# Patient Record
Sex: Male | Born: 1965
Health system: Southern US, Community
[De-identification: ages and names within clinical notes are randomized; demographics above are authoritative.]

## PROBLEM LIST (undated history)

## (undated) DIAGNOSIS — I2510 Atherosclerotic heart disease of native coronary artery without angina pectoris: Secondary | ICD-10-CM

## (undated) DIAGNOSIS — N189 Chronic kidney disease, unspecified: Secondary | ICD-10-CM

## (undated) DIAGNOSIS — G709 Myoneural disorder, unspecified: Secondary | ICD-10-CM

## (undated) DIAGNOSIS — I10 Essential (primary) hypertension: Secondary | ICD-10-CM

## (undated) DIAGNOSIS — M1990 Unspecified osteoarthritis, unspecified site: Secondary | ICD-10-CM

## (undated) DIAGNOSIS — E785 Hyperlipidemia, unspecified: Secondary | ICD-10-CM

## (undated) DIAGNOSIS — M792 Neuralgia and neuritis, unspecified: Secondary | ICD-10-CM

## (undated) DIAGNOSIS — Z992 Dependence on renal dialysis: Secondary | ICD-10-CM

## (undated) DIAGNOSIS — K5792 Diverticulitis of intestine, part unspecified, without perforation or abscess without bleeding: Secondary | ICD-10-CM

## (undated) HISTORY — DX: Chronic kidney disease, unspecified: N18.9

## (undated) HISTORY — DX: Dependence on renal dialysis: Z99.2

## (undated) HISTORY — PX: COLONOSCOPY: SHX174

## (undated) HISTORY — DX: Neuralgia and neuritis, unspecified: M79.2

---

## 2002-01-27 ENCOUNTER — Emergency Department (HOSPITAL_COMMUNITY): Admission: EM | Admit: 2002-01-27 | Discharge: 2002-01-27 | Payer: Self-pay | Admitting: Emergency Medicine

## 2011-08-29 DIAGNOSIS — Z992 Dependence on renal dialysis: Secondary | ICD-10-CM

## 2011-08-29 HISTORY — DX: Dependence on renal dialysis: Z99.2

## 2011-11-06 ENCOUNTER — Inpatient Hospital Stay (HOSPITAL_COMMUNITY): Payer: 59

## 2011-11-06 ENCOUNTER — Inpatient Hospital Stay (HOSPITAL_COMMUNITY)
Admission: AD | Admit: 2011-11-06 | Discharge: 2011-11-11 | DRG: 673 | Disposition: A | Discharge status: Home / Self Care | Payer: 59 | Source: Other Acute Inpatient Hospital | Attending: Internal Medicine | Admitting: Internal Medicine

## 2011-11-06 ENCOUNTER — Encounter (HOSPITAL_COMMUNITY): Payer: Self-pay | Admitting: Pulmonary Disease

## 2011-11-06 DIAGNOSIS — F17200 Nicotine dependence, unspecified, uncomplicated: Secondary | ICD-10-CM | POA: Diagnosis present

## 2011-11-06 DIAGNOSIS — D638 Anemia in other chronic diseases classified elsewhere: Secondary | ICD-10-CM | POA: Diagnosis present

## 2011-11-06 DIAGNOSIS — N2581 Secondary hyperparathyroidism of renal origin: Secondary | ICD-10-CM | POA: Diagnosis present

## 2011-11-06 DIAGNOSIS — I10 Essential (primary) hypertension: Secondary | ICD-10-CM | POA: Diagnosis present

## 2011-11-06 DIAGNOSIS — R290 Tetany: Secondary | ICD-10-CM | POA: Diagnosis present

## 2011-11-06 DIAGNOSIS — I120 Hypertensive chronic kidney disease with stage 5 chronic kidney disease or end stage renal disease: Principal | ICD-10-CM | POA: Diagnosis present

## 2011-11-06 DIAGNOSIS — R0789 Other chest pain: Secondary | ICD-10-CM | POA: Diagnosis present

## 2011-11-06 DIAGNOSIS — N186 End stage renal disease: Secondary | ICD-10-CM | POA: Diagnosis present

## 2011-11-06 DIAGNOSIS — Z79899 Other long term (current) drug therapy: Secondary | ICD-10-CM

## 2011-11-06 DIAGNOSIS — D631 Anemia in chronic kidney disease: Secondary | ICD-10-CM | POA: Diagnosis present

## 2011-11-06 DIAGNOSIS — K219 Gastro-esophageal reflux disease without esophagitis: Secondary | ICD-10-CM | POA: Diagnosis present

## 2011-11-06 DIAGNOSIS — E872 Acidosis, unspecified: Secondary | ICD-10-CM | POA: Diagnosis present

## 2011-11-06 DIAGNOSIS — N039 Chronic nephritic syndrome with unspecified morphologic changes: Secondary | ICD-10-CM | POA: Diagnosis present

## 2011-11-06 DIAGNOSIS — N179 Acute kidney failure, unspecified: Secondary | ICD-10-CM | POA: Diagnosis present

## 2011-11-06 LAB — COMPREHENSIVE METABOLIC PANEL
ALT: 12 U/L (ref 0–53)
AST: 12 U/L (ref 0–37)
Albumin: 3.4 g/dL — ABNORMAL LOW (ref 3.5–5.2)
Alkaline Phosphatase: 115 U/L (ref 39–117)
BUN: 100 mg/dL — ABNORMAL HIGH (ref 6–23)
CO2: 13 mEq/L — ABNORMAL LOW (ref 19–32)
Calcium: 6 mg/dL — CL (ref 8.4–10.5)
Chloride: 110 mEq/L (ref 96–112)
Creatinine, Ser: 10.33 mg/dL — ABNORMAL HIGH (ref 0.50–1.35)
GFR calc Af Amer: 6 mL/min — ABNORMAL LOW (ref >90)
GFR calc non Af Amer: 5 mL/min — ABNORMAL LOW (ref >90)
Glucose, Bld: 98 mg/dL (ref 70–99)
Potassium: 4.5 mEq/L (ref 3.5–5.1)
Sodium: 142 mEq/L (ref 135–145)
Total Bilirubin: 0.2 mg/dL — ABNORMAL LOW (ref 0.3–1.2)
Total Protein: 6.2 g/dL (ref 6.0–8.3)

## 2011-11-06 LAB — DIFFERENTIAL
Basophils Absolute: 0 10*3/uL (ref 0.0–0.1)
Basophils Relative: 0 % (ref 0–1)
Eosinophils Absolute: 0.1 10*3/uL (ref 0.0–0.7)
Eosinophils Relative: 1 % (ref 0–5)
Lymphocytes Relative: 12 % (ref 12–46)
Lymphs Abs: 1.1 10*3/uL (ref 0.7–4.0)
Monocytes Absolute: 0.6 10*3/uL (ref 0.1–1.0)
Monocytes Relative: 6 % (ref 3–12)
Neutro Abs: 7.3 10*3/uL (ref 1.7–7.7)
Neutrophils Relative %: 81 % — ABNORMAL HIGH (ref 43–77)

## 2011-11-06 LAB — CBC
HCT: 26 % — ABNORMAL LOW (ref 39.0–52.0)
Hemoglobin: 8.7 g/dL — ABNORMAL LOW (ref 13.0–17.0)
MCH: 29.6 pg (ref 26.0–34.0)
MCHC: 33.5 g/dL (ref 30.0–36.0)
MCV: 88.4 fL (ref 78.0–100.0)
Platelets: 157 10*3/uL (ref 150–400)
RBC: 2.94 MIL/uL — ABNORMAL LOW (ref 4.22–5.81)
RDW: 14.6 % (ref 11.5–15.5)
WBC: 9 10*3/uL (ref 4.0–10.5)

## 2011-11-06 LAB — PRO B NATRIURETIC PEPTIDE: Pro B Natriuretic peptide (BNP): 5362 pg/mL — ABNORMAL HIGH (ref 0–125)

## 2011-11-06 LAB — CARDIAC PANEL(CRET KIN+CKTOT+MB+TROPI)
CK, MB: 4.6 ng/mL — ABNORMAL HIGH (ref 0.3–4.0)
Relative Index: 0.5 (ref 0.0–2.5)
Total CK: 844 U/L — ABNORMAL HIGH (ref 7–232)
Troponin I: <0.3 ng/mL (ref <0.30)

## 2011-11-06 LAB — PROTIME-INR
INR: 1.16 (ref 0.00–1.49)
Prothrombin Time: 15 seconds (ref 11.6–15.2)

## 2011-11-06 LAB — APTT: aPTT: 32 seconds (ref 24–37)

## 2011-11-06 LAB — LIPID PANEL
Cholesterol: 136 mg/dL (ref 0–200)
HDL: 38 mg/dL — ABNORMAL LOW (ref >39)
LDL Cholesterol: 84 mg/dL (ref 0–99)
Total CHOL/HDL Ratio: 3.6 RATIO
Triglycerides: 71 mg/dL (ref <150)
VLDL: 14 mg/dL (ref 0–40)

## 2011-11-06 LAB — CREATININE, URINE, RANDOM: Creatinine, Urine: 55.14 mg/dL

## 2011-11-06 LAB — PHOSPHORUS: Phosphorus: 9.3 mg/dL — ABNORMAL HIGH (ref 2.3–4.6)

## 2011-11-06 LAB — MAGNESIUM: Magnesium: 2.1 mg/dL (ref 1.5–2.5)

## 2011-11-06 LAB — SODIUM, URINE, RANDOM: Sodium, Ur: 65 mEq/L

## 2011-11-06 MED ORDER — ENOXAPARIN SODIUM 30 MG/0.3ML ~~LOC~~ SOLN
30.0000 mg | SUBCUTANEOUS | Status: DC
  Administered 2011-11-06 – 2011-11-08 (×3): 30 mg via SUBCUTANEOUS
  Filled 2011-11-06 (×4): qty 0.3

## 2011-11-06 MED ORDER — HYDRALAZINE HCL 20 MG/ML IJ SOLN
5.0000 mg | INTRAMUSCULAR | Status: DC | PRN
  Administered 2011-11-07: 5 mg via INTRAVENOUS
  Filled 2011-11-06 (×2): qty 1

## 2011-11-06 MED ORDER — SODIUM CHLORIDE 0.9 % IJ SOLN
3.0000 mL | Freq: Two times a day (BID) | INTRAMUSCULAR | Status: DC
  Administered 2011-11-07 – 2011-11-11 (×7): 3 mL via INTRAVENOUS

## 2011-11-06 MED ORDER — SODIUM CHLORIDE 0.9 % IV SOLN
INTRAVENOUS | Status: DC
  Administered 2011-11-06: 21:00:00 via INTRAVENOUS

## 2011-11-06 MED ORDER — ADULT MULTIVITAMIN W/MINERALS CH
1.0000 | ORAL_TABLET | Freq: Every day | ORAL | Status: DC
  Administered 2011-11-06 – 2011-11-11 (×6): 1 via ORAL
  Filled 2011-11-06 (×6): qty 1

## 2011-11-06 NOTE — H&P (Signed)
PCP:  No primary provider on file.   DOA:  11/06/2011  6:01 PM  Chief Complaint:  Chest pain  HPI: Marco Arnold is 46 yo male with no known past medical history who presents to [facility] ED with main concern of progressively worsening, right sides, pressure- like chest pain that started 1 week prior to admission initially and has become more recurrent. He had 3 episodes since last week and each episode lasting several hours, associated with shortness of breath mostly with inspiration. He denies any other specific aggravating or alleviating factors. He also denies fever, chills, abdominal or urinary concerns. He is smoker and smokes 1 ppd over 20 years. He also endorses family history of heart and kidney disease. Marco Arnold describes any specific focal weakness, no numbness or tingling, no visual changes, no headaches, no orthopnea or palpitations. He also denies similar episodes in the past. He went to urgent care and was told his BP was > 200/100 and his kidney tests were abnormal but he is not sure of additional details.   Allergies: No Known Allergies  Prior to Admission medications   Medication Sig Start Date End Date Taking? Authorizing Provider  Multiple Vitamins-Minerals (MULTIVITAMINS THER. W/MINERALS) TABS Take 1 tablet by mouth daily.   Yes Historical Provider, MD    No past medical history on file.  No past surgical history on file.  Social History:  does not have a smoking history on file. He does not have any smokeless tobacco history on file. His alcohol and drug histories not on file.  No family history on file.  Review of Systems:  Constitutional: Denies fever, chills, diaphoresis, appetite change and fatigue.  HEENT: Denies photophobia, eye pain, redness, hearing loss, ear pain, congestion, sore throat, rhinorrhea, sneezing, mouth sores, trouble swallowing, neck pain, neck stiffness and tinnitus.   Respiratory: Denies cough, and wheezing.   Cardiovascular: Denies palpitations and leg  swelling.  Gastrointestinal: Denies nausea, vomiting, abdominal pain, diarrhea, constipation, blood in stool and abdominal distention.  Genitourinary: Denies dysuria, urgency, frequency, hematuria, flank pain and difficulty urinating.  Musculoskeletal: Denies myalgias, back pain, joint swelling, arthralgias and gait problem.  Skin: Denies pallor, rash and wound.  Neurological: Denies dizziness, seizures, syncope, weakness, light-headedness, numbness and headaches.  Hematological: Denies adenopathy. Easy bruising, personal or family bleeding history  Psychiatric/Behavioral: Denies suicidal ideation, mood changes, confusion, nervousness, sleep disturbance and agitation  Physical Exam:  There were no vitals filed for this visit. In the room Hr 75 bpm, BP = 156/88, O2 sat 100%, R 15 bpm Constitutional: Vital signs reviewed.  Patient is a well-developed and well-nourished in no acute distress and cooperative with exam. Alert and oriented x3.  Head: Normocephalic and atraumatic Ear: TM normal bilaterally Mouth: no erythema or exudates, MMM Eyes: PERRL, EOMI, conjunctivae normal, No scleral icterus.  Neck: Supple, Trachea midline normal ROM, No JVD, mass, thyromegaly, or carotid bruit present.  Cardiovascular: RRR, S1 normal, S2 normal, no MRG, pulses symmetric and intact bilaterally Pulmonary/Chest: CTAB, no wheezes, rales, or rhonchi Abdominal: Soft. Non-tender, non-distended, bowel sounds are normal, no masses, organomegaly, or guarding present.  GU: no CVA tenderness Musculoskeletal: No joint deformities, erythema, or stiffness, ROM full and no nontender Ext: no edema and no cyanosis, pulses palpable bilaterally (DP and Marco Arnold) Hematology: no cervical, inginal, or axillary adenopathy.  Neurological: A&O x3, Strenght is normal and symmetric bilaterally, cranial nerve II-XII are grossly intact, no focal motor deficit, sensory intact to light touch bilaterally.  Skin: Warm, dry and intact. No rash,  cyanosis, or clubbing.  Psychiatric: Normal mood and affect. speech and behavior is normal. Judgment and thought content normal. Cognition and memory are normal.   Labs on Admission:  No results found for this or any previous visit (from the past 48 hour(s)).  Radiological Exams on Admission: No results found.  Assessment/Plan   *Acute kidney injury - perhaps secondary to uncontrolled HTN - unclear etiology however, as per ED physician creatinine on admission ~ 14 but there are no admission labs - we will order stat CMP, CBC, UA, renal ultrasound - renal consult - IV fluids NS - follow up admission labs  Chest pain - certainly worrisome for ACS given multiple risk factors - will admit to SDU and obtain cardiac work up - currently no blood work available for review - follow up CXR - follow up cardiac enzymes - follow up EKG and 2 D ECHO - follow up TSH , BNP and A1c, lipid panel - will decide on cardio consult in AM - provide supportive care with morphine, antiemetics  HTN - uncontrolled - will start IV BP medication - current BP 156/88 - will keep monitoring vitals per floor protocol - based on BMP will likely need to hold off on ACEI, ARBS  Admit to stepdown  Time Spent on Admission: Greater than 45 minutes  [place], Gerrica Cygan 11/06/2011, 7:23 PM  [phone]

## 2011-11-06 NOTE — Progress Notes (Signed)
11/06/11 2210   Called MD about pt critical low Ca (6.0). Value is relatively consistent with previous values at [facility] prior to admission. Also spoke with MD about pt SBP > 180. MD is ordering PRN hydralazine.   Wyn Quaker RN

## 2011-11-07 ENCOUNTER — Inpatient Hospital Stay (HOSPITAL_COMMUNITY): Payer: 59

## 2011-11-07 LAB — COMPREHENSIVE METABOLIC PANEL
ALT: 11 U/L (ref 0–53)
AST: 12 U/L (ref 0–37)
Albumin: 3.2 g/dL — ABNORMAL LOW (ref 3.5–5.2)
Alkaline Phosphatase: 109 U/L (ref 39–117)
BUN: 102 mg/dL — ABNORMAL HIGH (ref 6–23)
CO2: 15 mEq/L — ABNORMAL LOW (ref 19–32)
Calcium: 5.8 mg/dL — CL (ref 8.4–10.5)
Chloride: 110 mEq/L (ref 96–112)
Creatinine, Ser: 9.89 mg/dL — ABNORMAL HIGH (ref 0.50–1.35)
GFR calc Af Amer: 6 mL/min — ABNORMAL LOW (ref >90)
GFR calc non Af Amer: 6 mL/min — ABNORMAL LOW (ref >90)
Glucose, Bld: 95 mg/dL (ref 70–99)
Potassium: 3.9 mEq/L (ref 3.5–5.1)
Sodium: 142 mEq/L (ref 135–145)
Total Bilirubin: 0.2 mg/dL — ABNORMAL LOW (ref 0.3–1.2)
Total Protein: 6 g/dL (ref 6.0–8.3)

## 2011-11-07 LAB — URINE MICROSCOPIC-ADD ON

## 2011-11-07 LAB — IRON AND TIBC
Iron: 51 ug/dL (ref 42–135)
Saturation Ratios: 21 % (ref 20–55)
TIBC: 241 ug/dL (ref 215–435)
UIBC: 190 ug/dL (ref 125–400)

## 2011-11-07 LAB — OSMOLALITY, URINE: Osmolality, Ur: 307 mOsm/kg — ABNORMAL LOW (ref 390–1090)

## 2011-11-07 LAB — URINALYSIS, ROUTINE W REFLEX MICROSCOPIC
Bilirubin Urine: NEGATIVE
Glucose, UA: 100 mg/dL — AB
Ketones, ur: NEGATIVE mg/dL
Leukocytes, UA: NEGATIVE
Nitrite: NEGATIVE
Protein, ur: 100 mg/dL — AB
Specific Gravity, Urine: 1.011 (ref 1.005–1.030)
Urobilinogen, UA: 0.2 mg/dL (ref 0.0–1.0)
pH: 5.5 (ref 5.0–8.0)

## 2011-11-07 LAB — CBC
HCT: 25.7 % — ABNORMAL LOW (ref 39.0–52.0)
Hemoglobin: 8.6 g/dL — ABNORMAL LOW (ref 13.0–17.0)
MCH: 29.6 pg (ref 26.0–34.0)
MCHC: 33.5 g/dL (ref 30.0–36.0)
MCV: 88.3 fL (ref 78.0–100.0)
Platelets: 157 10*3/uL (ref 150–400)
RBC: 2.91 MIL/uL — ABNORMAL LOW (ref 4.22–5.81)
RDW: 14.5 % (ref 11.5–15.5)
WBC: 9.8 10*3/uL (ref 4.0–10.5)

## 2011-11-07 LAB — CARDIAC PANEL(CRET KIN+CKTOT+MB+TROPI)
CK, MB: 4.4 ng/mL — ABNORMAL HIGH (ref 0.3–4.0)
CK, MB: 4.5 ng/mL — ABNORMAL HIGH (ref 0.3–4.0)
Relative Index: 0.6 (ref 0.0–2.5)
Relative Index: 0.6 (ref 0.0–2.5)
Total CK: 766 U/L — ABNORMAL HIGH (ref 7–232)
Total CK: 758 U/L — ABNORMAL HIGH (ref 7–232)
Troponin I: <0.3 ng/mL (ref <0.30)
Troponin I: <0.3 ng/mL (ref <0.30)

## 2011-11-07 LAB — FERRITIN: Ferritin: 267 ng/mL (ref 22–322)

## 2011-11-07 LAB — GLUCOSE, CAPILLARY: Glucose-Capillary: 146 mg/dL — ABNORMAL HIGH (ref 70–99)

## 2011-11-07 LAB — HEMOGLOBIN A1C
Hgb A1c MFr Bld: 5.2 % (ref <5.7)
Mean Plasma Glucose: 103 mg/dL (ref <117)

## 2011-11-07 LAB — TSH: TSH: 0.019 u[IU]/mL — ABNORMAL LOW (ref 0.350–4.500)

## 2011-11-07 MED ORDER — CALCIUM CARBONATE 1250 (500 CA) MG PO TABS
1000.0000 mg | ORAL_TABLET | Freq: Three times a day (TID) | ORAL | Status: DC
  Administered 2011-11-07 – 2011-11-09 (×7): 1000 mg via ORAL
  Filled 2011-11-07 (×11): qty 2

## 2011-11-07 MED ORDER — HYDRALAZINE HCL 20 MG/ML IJ SOLN
10.0000 mg | INTRAMUSCULAR | Status: DC | PRN
  Administered 2011-11-07 – 2011-11-09 (×3): 10 mg via INTRAVENOUS
  Filled 2011-11-07 (×4): qty 1

## 2011-11-07 MED ORDER — CLONIDINE HCL 0.1 MG PO TABS
0.1000 mg | ORAL_TABLET | ORAL | Status: DC | PRN
  Administered 2011-11-07 – 2011-11-08 (×3): 0.1 mg via ORAL
  Filled 2011-11-07 (×5): qty 1

## 2011-11-07 MED ORDER — AMLODIPINE BESYLATE 5 MG PO TABS
5.0000 mg | ORAL_TABLET | Freq: Every day | ORAL | Status: DC
  Administered 2011-11-07: 5 mg via ORAL
  Filled 2011-11-07 (×2): qty 1

## 2011-11-07 MED ORDER — SODIUM CHLORIDE 0.9 % IV SOLN
1.0000 g | Freq: Once | INTRAVENOUS | Status: AC
  Administered 2011-11-07: 1 g via INTRAVENOUS
  Filled 2011-11-07: qty 10

## 2011-11-07 MED ORDER — NITROGLYCERIN IN D5W 200-5 MCG/ML-% IV SOLN
2.0000 ug/min | INTRAVENOUS | Status: DC
  Filled 2011-11-07: qty 250

## 2011-11-07 MED ORDER — ONDANSETRON HCL 4 MG/2ML IJ SOLN
INTRAMUSCULAR | Status: AC
  Administered 2011-11-07: 4 mg
  Filled 2011-11-07: qty 2

## 2011-11-07 MED ORDER — ONDANSETRON HCL 4 MG/2ML IJ SOLN
4.0000 mg | Freq: Four times a day (QID) | INTRAMUSCULAR | Status: DC | PRN
  Administered 2011-11-07 – 2011-11-09 (×3): 4 mg via INTRAVENOUS
  Filled 2011-11-07 (×3): qty 2

## 2011-11-07 MED ORDER — ACETAMINOPHEN 325 MG PO TABS
650.0000 mg | ORAL_TABLET | Freq: Four times a day (QID) | ORAL | Status: DC | PRN
  Administered 2011-11-07 – 2011-11-10 (×2): 650 mg via ORAL
  Filled 2011-11-07 (×2): qty 2

## 2011-11-07 MED ORDER — HYDRALAZINE HCL 20 MG/ML IJ SOLN
5.0000 mg | INTRAMUSCULAR | Status: AC
  Administered 2011-11-07: 5 mg via INTRAVENOUS
  Filled 2011-11-07: qty 1

## 2011-11-07 MED ORDER — SODIUM CHLORIDE 0.9 % IV SOLN
1.0000 g | Freq: Once | INTRAVENOUS | Status: DC
  Filled 2011-11-07 (×2): qty 10

## 2011-11-07 NOTE — Progress Notes (Signed)
Utilization Review Completed.Donne Anon T3/07/2012

## 2011-11-07 NOTE — Consult Note (Signed)
Marco Arnold is an 46 y.o. male referred by Dr Charlies Silvers   Chief Complaint: CKD HPI: 46 yo WM with no past medical history presents to [facility] hospital bc sharp, fleeting Rt sided chest pain.  BP was quite elevated and labs showed Scr 11.  Has had 1-2 episodes of gross hematuria in his life with last one being about one month ago.  Has taken about 15-20 aleeve over past month.  Mild foaminess to urine in past month.  No change in UO.  Appetite good but energy decreased in past month along with decreased libido.  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  Muscle cramps in hands and legs x 2-3 months.  Long history of blood tinged stools that he relates to hemrhoids.  Grandfather had ESRD of ? Etiology in his 61's.  No family history of hearing loss  No past medical history on file.  No past surgical history on file.  No family history on file. Social History:  reports that he has been smoking Cigarettes.  He has smoked for the past 25 years. He does not have any smokeless tobacco history on file. He reports that he does not use illicit drugs. His alcohol history not on file.  Allergies: No Known Allergies  Medications Prior to Admission  Medication Dose Route Frequency Provider Last Rate Last Dose  . acetaminophen (TYLENOL) tablet 650 mg  650 mg Oral Q6H PRN Ambrose Finland, NP   650 mg at 11/07/11 0708  . amLODipine (NORVASC) tablet 5 mg  5 mg Oral Daily Windy Kalata, MD      . cloNIDine (CATAPRES) tablet 0.1 mg  0.1 mg Oral Q4H PRN Windy Kalata, MD      . enoxaparin (LOVENOX) injection 30 mg  30 mg Subcutaneous Q24H Robbie Lis, MD   30 mg at 11/06/11 2105  . hydrALAZINE (APRESOLINE) injection 5 mg  5 mg Intravenous Q4H PRN Ambrose Finland, NP      . mulitivitamin with minerals tablet 1 tablet  1 tablet Oral Daily Robbie Lis, MD   1 tablet at 11/07/11 1009  . ondansetron (ZOFRAN) 4 MG/2ML injection        4 mg at 11/07/11 0200  . ondansetron (ZOFRAN) injection 4 mg  4 mg  Intravenous Q6H PRN Ambrose Finland, NP      . sodium chloride 0.9 % injection 3 mL  3 mL Intravenous Q12H Robbie Lis, MD   3 mL at 11/07/11 1011  . DISCONTD: 0.9 %  sodium chloride infusion   Intravenous Continuous Robbie Lis, MD 75 mL/hr at 11/06/11 2105    . DISCONTD: nitroGLYCERIN 0.2 mg/mL in dextrose 5 % infusion  2-200 mcg/min Intravenous Titrated Robbie Lis, MD       No current outpatient prescriptions on file as of 11/07/2011.     Lab Results: UA: not done  [facility] 11/07/11 0345 11/06/11 1927  WBC 9.8 9.0  HGB 8.6* 8.7*  HCT 25.7* 26.0*  PLT 157 157   BMET  Basename 11/07/11 0345 11/06/11 1927  NA 142 142  K 3.9 4.5  CL 110 110  CO2 15* 13*  GLUCOSE 95 98  BUN 102* 100*  CREATININE 9.89* 10.33*  CALCIUM 5.8* 6.0*  PHOS -- 9.3*   LFT  Basename 11/07/11 0345  PROT 6.0  ALBUMIN 3.2*  AST 12  ALT 11  ALKPHOS 109  BILITOT 0.2*  BILIDIR --  IBILI --  US Renal  11/07/2011  *RADIOLOGY REPORT*  Clinical Data: Acute renal failure.  RENAL/URINARY TRACT ULTRASOUND COMPLETE  Comparison:  None.  Findings:  Right Kidney:  10.6 cm.  Renal cortical atrophy and increased echogenicity.  No hydronephrosis.  Multiple renal cysts.  The largest is in the upper pole, measures 2.4 cm.  Left Kidney:  10.5 cm.  Renal cortical thinning and increased echogenicity.  Numerous renal cysts.  The largest is in the lower pole and measures 3.1 cm.  Bladder:  Within normal limits.  IMPRESSION:  1.  Findings of medical renal disease. 2.  Bilateral renal cysts. 3. No hydronephrosis.  Original Report Authenticated By: Areta Haber, M.D.   Dg Chest Port 1 View  11/06/2011  *RADIOLOGY REPORT*  Clinical Data: Left chest pain, pressure  PORTABLE CHEST - 1 VIEW  Comparison: None.  Findings: Borderline heart size and vascular congestion.  Negative for CHF or pneumonia.  No effusion or pneumothorax.  Trachea midline.  IMPRESSION: Borderline cardiomegaly but no CHF or pneumonia  Original Report  Authenticated By: Jerilynn Mages. Daryll Brod, M.D.    ROS: As above Some mild decrease in vision Numbness in Rt hand, now resolved Skin rash on both pretibial regions for past month, now resolving with eucerin  PHYSICAL EXAM: Blood pressure 183/80, pulse 77, temperature 97.9 F (36.6 C), temperature source Oral, resp. rate 16, height 6\' 3"  (1.905 m), weight 103.5 kg (228 lb 2.8 oz), SpO2 100.00%. HEENT: PERRLA, EOMI Fundi-moderate arteriole narrowing NECK:no jvd LUNGS:clear CARDIAC:RRR wo MRG ABD:+bs NTND no bruits EXT:no edema  Faint,resolving rash both pretibial regions NEURO:CNI  M&SI no asterixis  Assessment: 1. Renal failure,  I suspect this is chronic and not reversible with HTN high on the list of causes.   2. Hypocalcemia  Points to chronicity of renal failure 3. Anemia - again points to chronicity 4. HTN PLAN: 1. Check UA 2. Check iron studies, pth 3. DC iv fluids and iv nitro 4. Start amlodipine and use clonidine prn for bp 5. Show dialysis video tapes 6. Check serologic studies and SPEP 7. Discussed reality of needing HD 8. Replace calcium   Katera Rybka T 11/07/2011, 2:33 PM

## 2011-11-07 NOTE — Progress Notes (Signed)
Patient ID: Marco Arnold, male   DOB: 1965-10-16, 46 y.o.   MRN: [id number]  Assessment/Plan   *Acute kidney injury  - perhaps secondary to uncontrolled HTN / family history of ESRD - renal consult appreciated - replace calcium  Chest pain  - the 3 sets of cardiac enzymes are negative so far - CXR with no consolidation or pneumonia  - follow up 2 D ECHO  - follow up A1c (lipid panel is normal, TSH  0.0.19)  - provide supportive care with morphine, antiemetics   HTN  - uncontrolled  - patient was on nitro drip - follow up renal recomendation - will keep monitoring vitals per floor protocol   LOW TSH - we will obtain T3, T4 and cortisol level - at present no symptoms of tachycardia, diarrhea; no signs of proptosis - called endocrinology for an input, recommendation is to follow up on the above labs; may initiate methimazole if symptomatic   Subjective:  No events overnight. Patient denies chest pain, shortness of breath, abdominal pain.   Objective:  Vital signs in last 24 hours:   11/07/11 1200 11/07/11 1600  BP: 176/83 200/97  Pulse:    Temp: 97.9 F (36.6 C) 98 F (36.7 C)  TempSrc: Oral Oral  Resp:    Height:    Weight:    SpO2:      Intake/Output from previous day:   Gross per 24 hour  Intake    975 ml  Output   2150 ml  Net  -1175 ml    Physical Exam: General: Alert, awake, oriented x3, in no acute distress. HEENT: No bruits, no goiter. Moist mucous membranes, no scleral icterus, no conjunctival pallor. Heart: Regular rate and rhythm, S1/S2 +, no murmurs, rubs, gallops. Lungs: Clear to auscultation bilaterally. No wheezing, no rhonchi, no rales.  Abdomen: Soft, nontender, nondistended, positive bowel sounds. Extremities: No clubbing or cyanosis, no pitting edema,  positive pedal pulses. Neuro: Grossly nonfocal.  Lab Results:   Lab 11/07/11 0345 11/06/11 1927  WBC 9.8 9.0  HGB 8.6* 8.7*  HCT 25.7* 26.0*  PLT 157 157  MCV 88.3 88.4    Lab  11/07/11 0345 11/06/11 1927  NA 142 142  K 3.9 4.5  CL 110 110  CO2 15* 13*  GLUCOSE 95 98  BUN 102* 100*  CREATININE 9.89* 10.33*  CALCIUM 5.8* 6.0*    Lab 11/06/11 1927  INR 1.16   Cardiac markers:  Lab 11/07/11 1138 11/07/11 0350 11/06/11 1927  CKMB 4.5* 4.4* 4.6*  TROPONINI <0.30 <0.30 <0.30    Studies/Results: US Renal 11/07/2011   IMPRESSION:  1.  Findings of medical renal disease. 2.  Bilateral renal cysts. 3. No hydronephrosis.   Dg Chest Port 1 View 11/06/2011  * IMPRESSION: Borderline cardiomegaly but no CHF or pneumonia    Medications: Scheduled Meds:   . amLODipine  5 mg Oral Daily  . calcium carbonate  1,000 mg of  calcium Oral TID WC  . calcium gluconate  1 g Intravenous Once  . calcium gluconate  1 g Intravenous Once  . enoxaparin  30 mg Subcutaneous Q24H  . mulitivitamin with minerals  1 tablet Oral Daily     EDUCATION - test results and diagnostic studies were discussed with patient and pt's family who was present at the bedside - patient and family have verbalized the understanding - questions were answered at the bedside and contact information was provided for additional questions or concerns   LOS: 1 day  Zeven Kocak 11/07/2011, 4:35 PM  [facility] Pager: [phone]

## 2011-11-08 DIAGNOSIS — I517 Cardiomegaly: Secondary | ICD-10-CM

## 2011-11-08 DIAGNOSIS — N186 End stage renal disease: Secondary | ICD-10-CM

## 2011-11-08 LAB — MPO/PR-3 (ANCA) ANTIBODIES
Myeloperoxidase Abs: <1 [AU]/ml
Serine Protease 3: 1 [AU]/ml

## 2011-11-08 LAB — RENAL FUNCTION PANEL
Albumin: 3.3 g/dL — ABNORMAL LOW (ref 3.5–5.2)
BUN: 97 mg/dL — ABNORMAL HIGH (ref 6–23)
CO2: 15 meq/L — ABNORMAL LOW (ref 19–32)
Calcium: 7.2 mg/dL — ABNORMAL LOW (ref 8.4–10.5)
Chloride: 109 meq/L (ref 96–112)
Creatinine, Ser: 9.73 mg/dL — ABNORMAL HIGH (ref 0.50–1.35)
GFR calc Af Amer: 7 mL/min — ABNORMAL LOW
GFR calc non Af Amer: 6 mL/min — ABNORMAL LOW
Glucose, Bld: 113 mg/dL — ABNORMAL HIGH (ref 70–99)
Phosphorus: 7.2 mg/dL — ABNORMAL HIGH (ref 2.3–4.6)
Potassium: 3.9 meq/L (ref 3.5–5.1)
Sodium: 141 meq/L (ref 135–145)

## 2011-11-08 LAB — HEPATITIS B SURFACE ANTIGEN: Hepatitis B Surface Ag: NEGATIVE

## 2011-11-08 LAB — KAPPA/LAMBDA LIGHT CHAINS
Kappa free light chain: 11.1 mg/dL — ABNORMAL HIGH (ref 0.33–1.94)
Kappa, lambda light chain ratio: 2.82 — ABNORMAL HIGH (ref 0.26–1.65)
Lambda free light chains: 3.94 mg/dL — ABNORMAL HIGH (ref 0.57–2.63)

## 2011-11-08 LAB — GLOMERULAR BASEMENT MEMBRANE ANTIBODIES: GBM Ab: <1 AU/mL (ref <20)

## 2011-11-08 LAB — T3, FREE: T3, Free: 1.9 pg/mL — ABNORMAL LOW (ref 2.3–4.2)

## 2011-11-08 LAB — T4, FREE: Free T4: 0.93 ng/dL (ref 0.80–1.80)

## 2011-11-08 LAB — ANA: Anti Nuclear Antibody(ANA): NEGATIVE

## 2011-11-08 LAB — C3 COMPLEMENT: C3 Complement: 96 mg/dL (ref 90–180)

## 2011-11-08 LAB — CREATININE, URINE, 24 HOUR

## 2011-11-08 LAB — C4 COMPLEMENT: Complement C4, Body Fluid: 30 mg/dL (ref 10–40)

## 2011-11-08 LAB — PARATHYROID HORMONE, INTACT (NO CA): PTH: 383.5 pg/mL — ABNORMAL HIGH (ref 14.0–72.0)

## 2011-11-08 MED ORDER — CLONIDINE HCL 0.1 MG PO TABS
0.1000 mg | ORAL_TABLET | Freq: Two times a day (BID) | ORAL | Status: DC
  Administered 2011-11-08: 0.1 mg via ORAL
  Filled 2011-11-08 (×2): qty 1

## 2011-11-08 MED ORDER — CLONIDINE HCL 0.1 MG PO TABS
0.1000 mg | ORAL_TABLET | ORAL | Status: DC | PRN
  Administered 2011-11-08 – 2011-11-09 (×2): 0.1 mg via ORAL
  Filled 2011-11-08: qty 1

## 2011-11-08 MED ORDER — AMLODIPINE BESYLATE 10 MG PO TABS
10.0000 mg | ORAL_TABLET | Freq: Every day | ORAL | Status: DC
  Administered 2011-11-08 – 2011-11-11 (×4): 10 mg via ORAL
  Filled 2011-11-08 (×4): qty 1

## 2011-11-08 MED ORDER — CARVEDILOL 12.5 MG PO TABS
12.5000 mg | ORAL_TABLET | Freq: Two times a day (BID) | ORAL | Status: DC
  Administered 2011-11-08 – 2011-11-11 (×6): 12.5 mg via ORAL
  Filled 2011-11-08 (×8): qty 1

## 2011-11-08 MED ORDER — DARBEPOETIN ALFA-POLYSORBATE 100 MCG/0.5ML IJ SOLN
100.0000 ug | INTRAMUSCULAR | Status: DC
  Administered 2011-11-08: 100 ug via SUBCUTANEOUS
  Filled 2011-11-08: qty 0.5

## 2011-11-08 MED ORDER — FUROSEMIDE 40 MG PO TABS
40.0000 mg | ORAL_TABLET | Freq: Two times a day (BID) | ORAL | Status: DC
  Administered 2011-11-08 – 2011-11-11 (×5): 40 mg via ORAL
  Filled 2011-11-08 (×8): qty 1

## 2011-11-08 NOTE — Progress Notes (Signed)
Patient ID: Marco Arnold, male   DOB: 1965-12-24, 46 y.o.   MRN: [id number]  Assessment/Plan   *Acute kidney injury / Progressive CKD Renal USG: suggestive of medico-renal disease - perhaps secondary to uncontrolled HTN, complement levels ok, SPEP pending, check hepatitis panel - renal consult appreciated, dialysis eval per renal - replace calcium Change to renal diet  Atypical CP resolved  Was associated with twitching of muscles in hands and around mouth Suspect secondary to hypocalcemia and uncontrolled HTN  HTN  - uncontrolled, BP between 170 and 200 yesterday Increase norvasc to 10mg , add scheduled clonidine Hydralazine PRN   LOW TSH  T3, T4 and cortisol level pending - at present no symptoms of tachycardia, diarrhea; no signs of proptosis  Anemia: of chronic disease: anemia panel unremarkable, will need erythropoietin, defer to renal  Transfer to tele, if BP better today  Subjective:  Patient denies chest pain, shortness of breath, abdominal pain.   Objective:  Vital signs in last 24 hours:   11/07/11 1200 11/07/11 1600  BP: 176/83 200/97  Pulse:    Temp: 97.9 F (36.6 C) 98 F (36.7 C)  TempSrc: Oral Oral  Resp:    Height:    Weight:    SpO2:      Intake/Output from previous day:   Gross per 24 hour  Intake    975 ml  Output   2150 ml  Net  -1175 ml    Physical Exam: General: Alert, awake, oriented x3, in no acute distress. HEENT: No bruits, no goiter. Moist mucous membranes, no scleral icterus, no conjunctival pallor. Heart: Regular rate and rhythm, S1/S2 +, no murmurs, rubs, gallops. Lungs: Clear to auscultation bilaterally. No wheezing, no rhonchi, no rales.  Abdomen: Soft, nontender, nondistended, positive bowel sounds. Extremities: No clubbing or cyanosis, no pitting edema,  positive pedal pulses. Neuro: Grossly nonfocal.  Lab Results:   Lab 11/07/11 0345 11/06/11 1927  WBC 9.8 9.0  HGB 8.6* 8.7*  HCT 25.7* 26.0*  PLT 157 157  MCV  88.3 88.4    Lab 11/07/11 0345 11/06/11 1927  NA 142 142  K 3.9 4.5  CL 110 110  CO2 15* 13*  GLUCOSE 95 98  BUN 102* 100*  CREATININE 9.89* 10.33*  CALCIUM 5.8* 6.0*    Lab 11/06/11 1927  INR 1.16   Cardiac markers:  Lab 11/07/11 1138 11/07/11 0350 11/06/11 1927  CKMB 4.5* 4.4* 4.6*  TROPONINI <0.30 <0.30 <0.30    Studies/Results: US Renal 11/07/2011   IMPRESSION:  1.  Findings of medical renal disease. 2.  Bilateral renal cysts. 3. No hydronephrosis.   Dg Chest Port 1 View 11/06/2011  * IMPRESSION: Borderline cardiomegaly but no CHF or pneumonia    Medications: Scheduled Meds:   . amLODipine  5 mg Oral Daily  . calcium carbonate  1,000 mg of  calcium Oral TID WC  . calcium gluconate  1 g Intravenous Once  . calcium gluconate  1 g Intravenous Once  . enoxaparin  30 mg Subcutaneous Q24H  . mulitivitamin with minerals  1 tablet Oral Daily      LOS: 2 days   Dvid Pendry 11/08/2011, 7:51 AM  [facility] Pager: [phone]

## 2011-11-08 NOTE — Progress Notes (Signed)
S:no N/V  No SOB O:BP 145/85  Pulse 85  Temp(Src) 98.1 F (36.7 C) (Oral)  Resp 13  Ht 6\' 3"  (1.905 m)  Wt 101.9 kg (224 lb 10.4 oz)  BMI 28.08 kg/m2  SpO2 97%  Intake/Output Summary (Last 24 hours) at 11/08/11 1005 Last data filed at 11/08/11 0800  Gross per 24 hour  Intake      3 ml  Output   2625 ml  Net  -2622 ml   Weight change: -1.7 kg (-3 lb 12 oz) [id number] and alert CVS:RRR Resp:clear Abd:+bs NTND Ext:no edema NEURO:CNI no asterixis      . amLODipine  10 mg Oral Daily  . calcium carbonate  1,000 mg of elemental calcium Oral TID WC  . calcium gluconate  1 g Intravenous Once  . calcium gluconate  1 g Intravenous Once  . cloNIDine  0.1 mg Oral BID  . enoxaparin  30 mg Subcutaneous Q24H  . hydrALAZINE  5 mg Intravenous NOW  . mulitivitamin with minerals  1 tablet Oral Daily  . sodium chloride  3 mL Intravenous Q12H  . DISCONTD: amLODipine  5 mg Oral Daily   US Renal  11/07/2011  *RADIOLOGY REPORT*  Clinical Data: Acute renal failure.  RENAL/URINARY TRACT ULTRASOUND COMPLETE  Comparison:  None.  Findings:  Right Kidney:  10.6 cm.  Renal cortical atrophy and increased echogenicity.  No hydronephrosis.  Multiple renal cysts.  The largest is in the upper pole, measures 2.4 cm.  Left Kidney:  10.5 cm.  Renal cortical thinning and increased echogenicity.  Numerous renal cysts.  The largest is in the lower pole and measures 3.1 cm.  Bladder:  Within normal limits.  IMPRESSION:  1.  Findings of medical renal disease. 2.  Bilateral renal cysts. 3. No hydronephrosis.  Original Report Authenticated By: Areta Haber, M.D.   Dg Chest Port 1 View  11/06/2011  *RADIOLOGY REPORT*  Clinical Data: Left chest pain, pressure  PORTABLE CHEST - 1 VIEW  Comparison: None.  Findings: Borderline heart size and vascular congestion.  Negative for CHF or pneumonia.  No effusion or pneumothorax.  Trachea midline.  IMPRESSION: Borderline cardiomegaly but no CHF or pneumonia  Original Report  Authenticated By: Jerilynn Mages. Daryll Brod, M.D.   BMET    Component Value Date/Time   NA 141 11/08/2011 0450   K 3.9 11/08/2011 0450   CL 109 11/08/2011 0450   CO2 15* 11/08/2011 0450   GLUCOSE 113* 11/08/2011 0450   BUN 97* 11/08/2011 0450   CREATININE 9.73* 11/08/2011 0450   CALCIUM 7.2* 11/08/2011 0450   GFRNONAA 6* 11/08/2011 0450   GFRAA 7* 11/08/2011 0450   CBC    Component Value Date/Time   WBC 9.8 11/07/2011 0345   RBC 2.91* 11/07/2011 0345   HGB 8.6* 11/07/2011 0345   HCT 25.7* 11/07/2011 0345   PLT 157 11/07/2011 0345   MCV 88.3 11/07/2011 0345   MCH 29.6 11/07/2011 0345   MCHC 33.5 11/07/2011 0345   RDW 14.5 11/07/2011 0345   LYMPHSABS 1.1 11/06/2011 1927   MONOABS 0.6 11/06/2011 1927   EOSABS 0.1 11/06/2011 1927   BASOSABS 0.0 11/06/2011 1927     Assessment:  1. Probable CKD 5  WU pending but not optimistic for recovery 2. HTN 3. Anemia 4. Hypocalcemia, improved  Plan: 1. Start aranesp 2. VVS to for access 3. He is not overtly uremic and so would like to hold off on HD for now and get an access in 4.  Dietician to see 5. Prefer coreg over clonidine long term but will keep as prn    Sarina Robleto T

## 2011-11-08 NOTE — Progress Notes (Signed)
*  PRELIMINARY RESULTS* Echocardiogram 2D Echocardiogram has been performed.  Elvia Collum 11/08/2011, 3:35 PM

## 2011-11-08 NOTE — Consult Note (Signed)
[facility] [place] CONSULT NOTE 11/08/2011 [id number]  [id number] 200/100 at admission with abnormal kidney function Referring Physician: MATTINGLY,MICHAEL    History of Present Illness: Pt is 46 yo male with no known past medical history who presents to [facility] ED with main concern of progressively worsening, right sides, pressure- like chest pain that started 1 week prior to admission initially and has become more recurrent. He had 3 episodes since last week and each episode lasting several hours, associated with shortness of breath mostly with inspiration. He denies any other specific aggravating or alleviating factors. He also denies fever, chills, abdominal or urinary concerns. He is smoker and smokes 1 ppd over 20 years. He also endorses family history of heart and kidney disease. Pt describes any specific focal weakness, no numbness or tingling, no visual changes, no headaches, no orthopnea or palpitations. He also denies similar episodes in the past. He went to urgent care and was told his BP was > 200/100 and his kidney tests were abnormal but he is not sure of additional details.         ROS: [x]  Positive  [ ]  Denies    General: [ ]  Weight loss, [ ]  Fever, [ ]  chills Neurologic: x[ ]  Dizziness, [ ]  Blackouts, [ ]  Seizure [ ]  Stroke, [ ]  "Mini stroke", [ ]  Slurred speech, [ ]  Temporary blindness; [ ]  weakness in arms or legs, [ ]  Hoarseness Cardiac: x[ ]  Chest pain/pressure, [ ]  Shortness of breath at rest [ ]  Shortness of breath with exertion, [ ]  Atrial fibrillation or irregular heartbeat Vascular: [ ]  Pain in legs with walking, [ ]  Pain in legs at rest, [ ]  Pain in legs at night,  [ ]  Non-healing ulcer, [ ]  Blood clot in vein/DVT,   Pulmonary: [ ]  Home oxygen, [ ]  Productive cough, [ ]  Coughing up blood, [ ]  Asthma,  [ ]  Wheezing Musculoskeletal:  [ ]  Arthritis, [ ]  Low back pain, [ ]  Joint pain Hematologic: [ ]  Easy Bruising, [ ]  Anemia; [ ]   Hepatitis Gastrointestinal: [ ]  Blood in stool, [ ]  Gastroesophageal Reflux/heartburn, [ ]  Trouble swallowing Urinary: [ ]  chronic Kidney disease, [ ]  on HD - [ ]  MWF or [ ]  TTHS, [ ]  Burning with urination, [ ]  Difficulty urinating Skin: [ ]  Rashes, [ ]  Wounds Psychological: [ ]  Anxiety, [ ]  Depression  Social History History  Substance Use Topics  . Smoking status: Current Everyday Smoker -- 25 years    Types: Cigarettes  . Smokeless tobacco: Not on file  . Alcohol Use:      No Known Allergies  Current Facility-Administered Medications  Medication Dose Route Frequency Provider Last Rate Last Dose  . acetaminophen (TYLENOL) tablet 650 mg  650 mg Oral Q6H PRN Ambrose Finland, NP   650 mg at 11/07/11 0708  . amLODipine (NORVASC) tablet 10 mg  10 mg Oral Daily Domenic Polite, MD   10 mg at 11/08/11 1016  . calcium carbonate (OS-CAL - dosed in mg of elemental calcium) tablet 1,000 mg of elemental calcium  1,000 mg of elemental calcium Oral TID WC Windy Kalata, MD   1,000 mg of elemental calcium at 11/08/11 1016  . calcium gluconate 1 g in sodium chloride 0.9 % 100 mL IVPB  1 g Intravenous Once Windy Kalata, MD      . calcium gluconate 1 g in sodium chloride 0.9 % 100 mL IVPB  1 g Intravenous Once  Windy Kalata, MD   1 g at 11/07/11 1612  . carvedilol (COREG) tablet 12.5 mg  12.5 mg Oral BID WC Windy Kalata, MD      . cloNIDine (CATAPRES) tablet 0.1 mg  0.1 mg Oral Q4H PRN Windy Kalata, MD      . darbepoetin [facility]) injection 100 mcg  100 mcg Subcutaneous Q Wed-1800 Windy Kalata, MD      . enoxaparin (LOVENOX) injection 30 mg  30 mg Subcutaneous Q24H Robbie Lis, MD   30 mg at 11/07/11 2204  . furosemide (LASIX) tablet 40 mg  40 mg Oral BID Windy Kalata, MD      . hydrALAZINE (APRESOLINE) injection 10 mg  10 mg Intravenous Q4H PRN Ambrose Finland, NP   10 mg at 11/07/11 2200  . hydrALAZINE (APRESOLINE) injection 5 mg  5 mg Intravenous NOW  Ambrose Finland, NP   5 mg at 11/07/11 1959  . mulitivitamin with minerals tablet 1 tablet  1 tablet Oral Daily Robbie Lis, MD   1 tablet at 11/08/11 1016  . ondansetron (ZOFRAN) injection 4 mg  4 mg Intravenous Q6H PRN Ambrose Finland, NP   4 mg at 11/07/11 2252  . sodium chloride 0.9 % injection 3 mL  3 mL Intravenous Q12H Robbie Lis, MD   3 mL at 11/08/11 1016  . DISCONTD: 0.9 %  sodium chloride infusion   Intravenous Continuous Robbie Lis, MD 75 mL/hr at 11/06/11 2105    . DISCONTD: amLODipine (NORVASC) tablet 5 mg  5 mg Oral Daily Windy Kalata, MD   5 mg at 11/07/11 1612  . DISCONTD: cloNIDine (CATAPRES) tablet 0.1 mg  0.1 mg Oral Q4H PRN Windy Kalata, MD   0.1 mg at 11/08/11 0302  . DISCONTD: cloNIDine (CATAPRES) tablet 0.1 mg  0.1 mg Oral BID Domenic Polite, MD   0.1 mg at 11/08/11 1018  . DISCONTD: hydrALAZINE (APRESOLINE) injection 5 mg  5 mg Intravenous Q4H PRN Ambrose Finland, NP   5 mg at 11/07/11 1755  . DISCONTD: nitroGLYCERIN 0.2 mg/mL in dextrose 5 % infusion  2-200 mcg/min Intravenous Titrated Robbie Lis, MD         Imaging: US Renal  11/07/2011  *RADIOLOGY REPORT*  Clinical Data: Acute renal failure.  RENAL/URINARY TRACT ULTRASOUND COMPLETE  Comparison:  None.  Findings:  Right Kidney:  10.6 cm.  Renal cortical atrophy and increased echogenicity.  No hydronephrosis.  Multiple renal cysts.  The largest is in the upper pole, measures 2.4 cm.  Left Kidney:  10.5 cm.  Renal cortical thinning and increased echogenicity.  Numerous renal cysts.  The largest is in the lower pole and measures 3.1 cm.  Bladder:  Within normal limits.  IMPRESSION:  1.  Findings of medical renal disease. 2.  Bilateral renal cysts. 3. No hydronephrosis.  Original Report Authenticated By: Areta Haber, M.D.   Dg Chest Port 1 View  11/06/2011  *RADIOLOGY REPORT*  Clinical Data: Left chest pain, pressure  PORTABLE CHEST - 1 VIEW  Comparison: None.  Findings: Borderline heart size and vascular  congestion.  Negative for CHF or pneumonia.  No effusion or pneumothorax.  Trachea midline.  IMPRESSION: Borderline cardiomegaly but no CHF or pneumonia  Original Report Authenticated By: Jerilynn Mages. Daryll Brod, M.D.    Significant Diagnostic Studies: CBC Lab Results  Component Value Date   WBC 9.8 11/07/2011   HGB 8.6* 11/07/2011   HCT  25.7* 11/07/2011   MCV 88.3 11/07/2011   PLT 157 11/07/2011    BMET    Component Value Date/Time   NA 141 11/08/2011 0450   K 3.9 11/08/2011 0450   CL 109 11/08/2011 0450   CO2 15* 11/08/2011 0450   GLUCOSE 113* 11/08/2011 0450   BUN 97* 11/08/2011 0450   CREATININE 9.73* 11/08/2011 0450   CALCIUM 7.2* 11/08/2011 0450   GFRNONAA 6* 11/08/2011 0450   GFRAA 7* 11/08/2011 0450    COAG Lab Results  Component Value Date   INR 1.16 11/06/2011   No results found for this basename: PTT     Physical Examination  Patient Vitals for the past 24 hrs:  BP Temp Temp src Pulse Resp SpO2 Weight  11/08/11 0800 - 98.1 F (36.7 C) Oral - - - -  11/08/11 0355 145/85 mmHg 98.1 F (36.7 C) Oral - - 97 % 101.9 kg (224 lb 10.4 oz)  11/08/11 0302 176/81 mmHg - - - - - -  11/08/11 0000 - 98.8 F (37.1 C) Oral - - - -  11/07/11 2200 186/81 mmHg - - - - - -  11/07/11 2000 198/95 mmHg - Oral - - - -  11/07/11 1959 198/95 mmHg - - - - - -  11/07/11 1930 171/82 mmHg - - 85  13  99 % -  11/07/11 1910 165/75 mmHg - - 87  16  97 % -  11/07/11 1800 179/97 mmHg - - 82  17  99 % -  11/07/11 1600 200/97 mmHg 98 F (36.7 C) Oral - - - -   Pulse Readings from Last 3 Encounters:  11/07/11 85    General:  WDWN in NAD   Eyes: Pupils equal Pulmonary: normal non-labored breathing , without Rales, rhonchi,  wheezing Cardiac: RRR,   Abdomen: soft, NT,  Skin: no rashes, ulcers noted Vascular Exam/Pulses: bil radial and ulnar pulses are intact and equal. Bil LE pulses intact and equal.  Extremities without ischemic changes, no Gangrene , no cellulitis; no open wounds;    Musculoskeletal: no muscle wasting or atrophy  Neurologic: A&O X 3; Appropriate Affect ;  SENSATION: normal; MOTOR FUNCTION:  moving all extremities equally.  Speech is fluent/normal  Non-Invasive Vascular Imaging: pending bil. Vein mapping.  ASSESSMENT:CKD  PLAN:Pending vein mapping we will try to perform surgery Friday if OR time permits.  Plan left upper extremity AV fistula since her is right handed.    Agree with above. Right handed patient with what appears to be satisfactory cephalic vein in left upper extremity. Pending results of vein mapping we'll plan left upper extremity AV fistula on Friday if schedule permits

## 2011-11-09 ENCOUNTER — Encounter (HOSPITAL_COMMUNITY): Payer: Self-pay | Admitting: *Deleted

## 2011-11-09 DIAGNOSIS — Z992 Dependence on renal dialysis: Secondary | ICD-10-CM

## 2011-11-09 LAB — RENAL FUNCTION PANEL
Albumin: 3.6 g/dL (ref 3.5–5.2)
BUN: 99 mg/dL — ABNORMAL HIGH (ref 6–23)
CO2: 17 mEq/L — ABNORMAL LOW (ref 19–32)
Calcium: 7.8 mg/dL — ABNORMAL LOW (ref 8.4–10.5)
Chloride: 107 mEq/L (ref 96–112)
Creatinine, Ser: 10.06 mg/dL — ABNORMAL HIGH (ref 0.50–1.35)
GFR calc Af Amer: 6 mL/min — ABNORMAL LOW (ref >90)
GFR calc non Af Amer: 5 mL/min — ABNORMAL LOW (ref >90)
Glucose, Bld: 109 mg/dL — ABNORMAL HIGH (ref 70–99)
Phosphorus: 7 mg/dL — ABNORMAL HIGH (ref 2.3–4.6)
Potassium: 4.4 mEq/L (ref 3.5–5.1)
Sodium: 140 mEq/L (ref 135–145)

## 2011-11-09 LAB — GLUCOSE, CAPILLARY: Glucose-Capillary: 95 mg/dL (ref 70–99)

## 2011-11-09 LAB — HEPATITIS C ANTIBODY: HCV Ab: NEGATIVE

## 2011-11-09 MED ORDER — HYDRALAZINE HCL 25 MG PO TABS
25.0000 mg | ORAL_TABLET | Freq: Three times a day (TID) | ORAL | Status: DC
  Administered 2011-11-09 – 2011-11-10 (×6): 25 mg via ORAL
  Filled 2011-11-09 (×11): qty 1

## 2011-11-09 MED ORDER — PANTOPRAZOLE SODIUM 40 MG PO TBEC
40.0000 mg | DELAYED_RELEASE_TABLET | Freq: Every day | ORAL | Status: DC
  Administered 2011-11-09 – 2011-11-11 (×3): 40 mg via ORAL
  Filled 2011-11-09 (×3): qty 1

## 2011-11-09 MED ORDER — CALCITRIOL 0.25 MCG PO CAPS
0.2500 ug | ORAL_CAPSULE | Freq: Every day | ORAL | Status: DC
  Administered 2011-11-09 – 2011-11-11 (×3): 0.25 ug via ORAL
  Filled 2011-11-09 (×3): qty 1

## 2011-11-09 NOTE — Progress Notes (Signed)
VASCULAR LAB PRELIMINARY  PRELIMINARY  PRELIMINARY  PRELIMINARY  Right  Upper Extremity Vein Map    Cephalic  Segment Diameter Depth Comment  1. Axilla 4.2 mm mm   2. Mid upper arm 3.5 mm mm   3. Above AC 3 mm mm   4. In AC 2.1 mm mm branch  5. Below AC 2 mm mm   6. Mid forearm 2.2 mm mm   7. Wrist 2.2 mm mm    mm mm    mm mm    mm mm    Basilic  Segment Diameter Depth Comment  1. Axilla 4.7 mm 0.65 cm   2. Mid upper arm 4.2 mm 0.84 cm   3. Above AC 5.3 mm 0.69 cm   4. In AC 2.1 mm 0.23 cm  branch  5. Below AC 1.8 mm 0.21 cm    Left Upper Extremity Vein Map    Cephalic  Segment Diameter Depth Comment  1. Axilla mm mm Not visualized.  2. Proximal upper arm mm mm Not visualized.  3. Mid upper arm mm mm Not visualized.  4. Above AC 2.5 mm mm   5. In AC 5.6 mm mm Thrombosed.  6. Below AC 1.5 mm mm branch  7. Mid forearm 1.5 mm mm   8. Wrist 2.2 mm mm   Basilic  Segment Diameter Depth Comment  1. Axilla 4.4 mm 1.22 cm   2. Proximal upper arm 4 mm 0.81 cm   3. Mid upper arm 4.9 mm 0.99 cm   4. Above AC 3.6 mm 0.71 cm   5. In AC 2.5 mm 0.37 cm  branch  6. Below AC 2.2 mm 0.19 cm                          Charlaine Dalton, 11/09/2011, 7:47 PM

## 2011-11-09 NOTE — Progress Notes (Signed)
Patient ID: Marco Arnold, male   DOB: 1966/04/20, 46 y.o.   MRN: [id number]  Assessment/Plan   *Acute kidney injury / Progressive CKD, non oliguric with metabolic acidosis Renal USG: suggestive of medico-renal disease - secondary to uncontrolled HTN, complement levels ok, SPEP with mildly elevated kappa free light chains, lamda free light chain and ratio: likely non-specific, await renal input.  hepatitis B negative, hep C pending Renal following, VVS plans for AVG on friday On lasix per renal, >6.9L negative since admission Nausea and vomiting unclear if related to uremia or heartburn: PPI trial - replace calcium Change to renal diet  Atypical CP:  resolved  Was associated with twitching of muscles in hands and around mouth Suspect secondary to hypocalcemia and uncontrolled HTN ECHO unremarkable except for mild LVH and grade 1 diast dysfunction, EF 60%  HTN :- uncontrolled Increase norvasc to 10mg , add scheduled hydralazine,  Clonidine changed to PRN   LOW [id number] T4 ok, likely Euthyroid sick syndrome No need for supplemental thyroxine  Anemia: of chronic disease: anemia panel unremarkable, will need erythropoietin, defer to renal  Heartburn: add PPI  Transfer to tele, if BP better today  Subjective:  Patient denies chest pain, shortness of breath. Was nauseous and vomited last pm, c/o heartburn too  Objective:  Vital signs in last 24 hours:   11/07/11 1200 11/07/11 1600  BP: 176/83 200/97  Pulse:    Temp: 97.9 F (36.6 C) 98 F (36.7 C)  TempSrc: Oral Oral  Resp:    Height:    Weight:    SpO2:      Intake/Output from previous day:   Gross per 24 hour  Intake    975 ml  Output   2150 ml  Net  -1175 ml    Physical Exam: General: Alert, awake, oriented x3, in no acute distress. HEENT: No bruits, no goiter. Moist mucous membranes, no scleral icterus, no conjunctival pallor. Heart: Regular rate and rhythm, S1/S2 +, no murmurs, rubs, gallops. Lungs: Clear  to auscultation bilaterally. No wheezing, no rhonchi, no rales.  Abdomen: Soft, nontender, nondistended, positive bowel sounds. Extremities: No clubbing or cyanosis, no pitting edema,  positive pedal pulses. Neuro: Grossly nonfocal.  Lab Results:   Lab 11/07/11 0345 11/06/11 1927  WBC 9.8 9.0  HGB 8.6* 8.7*  HCT 25.7* 26.0*  PLT 157 157  MCV 88.3 88.4    Lab 11/07/11 0345 11/06/11 1927  NA 142 142  K 3.9 4.5  CL 110 110  CO2 15* 13*  GLUCOSE 95 98  BUN 102* 100*  CREATININE 9.89* 10.33*  CALCIUM 5.8* 6.0*    Lab 11/06/11 1927  INR 1.16   Cardiac markers:  Lab 11/07/11 1138 11/07/11 0350 11/06/11 1927  CKMB 4.5* 4.4* 4.6*  TROPONINI <0.30 <0.30 <0.30    Studies/Results: US Renal 11/07/2011   IMPRESSION:  1.  Findings of medical renal disease. 2.  Bilateral renal cysts. 3. No hydronephrosis.   Dg Chest Port 1 View 11/06/2011  * IMPRESSION: Borderline cardiomegaly but no CHF or pneumonia    Medications: Scheduled Meds:   . amLODipine  5 mg Oral Daily  . calcium carbonate  1,000 mg of  calcium Oral TID WC  . calcium gluconate  1 g Intravenous Once  . calcium gluconate  1 g Intravenous Once  . enoxaparin  30 mg Subcutaneous Q24H  . mulitivitamin with minerals  1 tablet Oral Daily      LOS: 3 days   Polly Barner 11/09/2011, 7:50 AM  [facility] Pager: [phone]

## 2011-11-09 NOTE — Progress Notes (Signed)
S: Co reflux and vomited last night.  Ate breakfast with no problems  No SOB O:BP 191/90  Pulse 85  Temp(Src) 98.1 F (36.7 C) (Oral)  Resp 20  Ht 6\' 3"  (1.905 m)  Wt 100.4 kg (221 lb 5.5 oz)  BMI 27.67 kg/m2  SpO2 99%  Intake/Output Summary (Last 24 hours) at 11/09/11 1017 Last data filed at 11/09/11 0843  Gross per 24 hour  Intake      0 ml  Output   3700 ml  Net  -3700 ml   Weight change: -1.5 kg (-3 lb 4.9 oz) [id number] and alert CVS:RRR Resp:clear Abd:+bs NTND Ext:no edema NEURO:CNI no asterixis      . amLODipine  10 mg Oral Daily  . calcium carbonate  1,000 mg of elemental calcium Oral TID WC  . calcium gluconate  1 g Intravenous Once  . carvedilol  12.5 mg Oral BID WC  . darbepoetin (ARANESP) injection - NON-DIALYSIS  100 mcg Subcutaneous Q Wed-1800  . enoxaparin  30 mg Subcutaneous Q24H  . furosemide  40 mg Oral BID  . hydrALAZINE  25 mg Oral Q8H  . mulitivitamin with minerals  1 tablet Oral Daily  . sodium chloride  3 mL Intravenous Q12H  . DISCONTD: cloNIDine  0.1 mg Oral BID   No results found. BMET    Component Value Date/Time   NA 140 11/09/2011 0530   K 4.4 11/09/2011 0530   CL 107 11/09/2011 0530   CO2 17* 11/09/2011 0530   GLUCOSE 109* 11/09/2011 0530   BUN 99* 11/09/2011 0530   CREATININE 10.06* 11/09/2011 0530   CALCIUM 7.8* 11/09/2011 0530   GFRNONAA 5* 11/09/2011 0530   GFRAA 6* 11/09/2011 0530   CBC    Component Value Date/Time   WBC 9.8 11/07/2011 0345   RBC 2.91* 11/07/2011 0345   HGB 8.6* 11/07/2011 0345   HCT 25.7* 11/07/2011 0345   PLT 157 11/07/2011 0345   MCV 88.3 11/07/2011 0345   MCH 29.6 11/07/2011 0345   MCHC 33.5 11/07/2011 0345   RDW 14.5 11/07/2011 0345   LYMPHSABS 1.1 11/06/2011 1927   MONOABS 0.6 11/06/2011 1927   EOSABS 0.1 11/06/2011 1927   BASOSABS 0.0 11/06/2011 1927     Assessment:  1. Probable CKD 5  WU pending but not optimistic for recovery 2. HTN 3. Anemia 4. Hypocalcemia, improved 5 Sec HPTH  Plan: 1. His  light chain ratio is high so I am waiting to see SPEP and IFE 2. For AVF tomorrow 3. Start PPI 4. Can move to 6700 5. Start calcitriol  Chaselynn Kepple T

## 2011-11-10 ENCOUNTER — Other Ambulatory Visit: Payer: Self-pay

## 2011-11-10 ENCOUNTER — Inpatient Hospital Stay (HOSPITAL_COMMUNITY): Payer: 59 | Admitting: Anesthesiology

## 2011-11-10 ENCOUNTER — Encounter (HOSPITAL_COMMUNITY): Payer: Self-pay | Admitting: Anesthesiology

## 2011-11-10 ENCOUNTER — Encounter (HOSPITAL_COMMUNITY)
Admission: AD | Disposition: A | Discharge status: Home / Self Care | Payer: Self-pay | Source: Other Acute Inpatient Hospital | Attending: Internal Medicine

## 2011-11-10 HISTORY — PX: AV FISTULA PLACEMENT: SHX1204

## 2011-11-10 LAB — PROTEIN ELECTROPHORESIS, SERUM
Albumin ELP: 60.3 % (ref 55.8–66.1)
Albumin ELP: 60.7 % (ref 55.8–66.1)
Alpha-1-Globulin: 5.9 % — ABNORMAL HIGH (ref 2.9–4.9)
Alpha-1-Globulin: 5.8 % — ABNORMAL HIGH (ref 2.9–4.9)
Alpha-2-Globulin: 10.7 % (ref 7.1–11.8)
Alpha-2-Globulin: 10.6 % (ref 7.1–11.8)
Beta 2: 5.5 % (ref 3.2–6.5)
Beta 2: 5.5 % (ref 3.2–6.5)
Beta Globulin: 5.3 % (ref 4.7–7.2)
Beta Globulin: 5 % (ref 4.7–7.2)
Gamma Globulin: 12.3 % (ref 11.1–18.8)
Gamma Globulin: 12.4 % (ref 11.1–18.8)
M-Spike, %: NOT DETECTED g/dL
M-Spike, %: NOT DETECTED g/dL
Total Protein ELP: 5.7 g/dL — ABNORMAL LOW (ref 6.0–8.3)
Total Protein ELP: 6.1 g/dL (ref 6.0–8.3)

## 2011-11-10 LAB — BASIC METABOLIC PANEL
BUN: 111 mg/dL — ABNORMAL HIGH (ref 6–23)
CO2: 15 mEq/L — ABNORMAL LOW (ref 19–32)
Calcium: 7.5 mg/dL — ABNORMAL LOW (ref 8.4–10.5)
Chloride: 107 mEq/L (ref 96–112)
Creatinine, Ser: 11.28 mg/dL — ABNORMAL HIGH (ref 0.50–1.35)
GFR calc Af Amer: 6 mL/min — ABNORMAL LOW (ref >90)
GFR calc non Af Amer: 5 mL/min — ABNORMAL LOW (ref >90)
Glucose, Bld: 98 mg/dL (ref 70–99)
Potassium: 4.4 mEq/L (ref 3.5–5.1)
Sodium: 140 mEq/L (ref 135–145)

## 2011-11-10 LAB — GLUCOSE, CAPILLARY: Glucose-Capillary: 93 mg/dL (ref 70–99)

## 2011-11-10 LAB — MRSA PCR SCREENING: MRSA by PCR: NEGATIVE

## 2011-11-10 SURGERY — ARTERIOVENOUS (AV) FISTULA CREATION
Anesthesia: Monitor Anesthesia Care | Site: Arm Lower | Laterality: Left | Wound class: Clean

## 2011-11-10 MED ORDER — SODIUM CHLORIDE 0.9 % IV SOLN
1020.0000 mg | Freq: Once | INTRAVENOUS | Status: AC
  Administered 2011-11-11: 1020 mg via INTRAVENOUS
  Filled 2011-11-10 (×2): qty 34

## 2011-11-10 MED ORDER — ALUM & MAG HYDROXIDE-SIMETH 200-200-20 MG/5ML PO SUSP
15.0000 mL | Freq: Four times a day (QID) | ORAL | Status: DC | PRN
  Filled 2011-11-10: qty 30

## 2011-11-10 MED ORDER — SODIUM CHLORIDE 0.9 % IR SOLN
Status: DC | PRN
  Administered 2011-11-10: 11:00:00

## 2011-11-10 MED ORDER — MORPHINE SULFATE 4 MG/ML IJ SOLN
0.0500 mg/kg | INTRAMUSCULAR | Status: DC | PRN
  Administered 2011-11-11: 4 mg via INTRAVENOUS
  Filled 2011-11-10: qty 1

## 2011-11-10 MED ORDER — MIDAZOLAM HCL 5 MG/5ML IJ SOLN
INTRAMUSCULAR | Status: DC | PRN
  Administered 2011-11-10 (×2): 0.5 mg via INTRAVENOUS
  Administered 2011-11-10 (×3): 1 mg via INTRAVENOUS

## 2011-11-10 MED ORDER — CEFAZOLIN SODIUM 1-5 GM-% IV SOLN
INTRAVENOUS | Status: AC
  Filled 2011-11-10: qty 100

## 2011-11-10 MED ORDER — SODIUM CHLORIDE 0.9 % IV SOLN
INTRAVENOUS | Status: DC | PRN
  Administered 2011-11-10 (×2): via INTRAVENOUS

## 2011-11-10 MED ORDER — SODIUM BICARBONATE 650 MG PO TABS
1300.0000 mg | ORAL_TABLET | Freq: Two times a day (BID) | ORAL | Status: DC
  Administered 2011-11-10 – 2011-11-11 (×3): 1300 mg via ORAL
  Filled 2011-11-10 (×4): qty 2

## 2011-11-10 MED ORDER — SUFENTANIL CITRATE 50 MCG/ML IV SOLN
INTRAVENOUS | Status: DC | PRN
  Administered 2011-11-10 (×5): 5 ug via INTRAVENOUS

## 2011-11-10 MED ORDER — 0.9 % SODIUM CHLORIDE (POUR BTL) OPTIME
TOPICAL | Status: DC | PRN
  Administered 2011-11-10: 1000 mL

## 2011-11-10 MED ORDER — LIDOCAINE HCL (CARDIAC) 20 MG/ML IV SOLN
INTRAVENOUS | Status: DC | PRN
  Administered 2011-11-10: 60 mg via INTRAVENOUS

## 2011-11-10 MED ORDER — CALCIUM CARBONATE 1250 (500 CA) MG PO TABS
1000.0000 mg | ORAL_TABLET | Freq: Three times a day (TID) | ORAL | Status: DC
  Administered 2011-11-10 – 2011-11-11 (×4): 1000 mg via ORAL
  Filled 2011-11-10 (×7): qty 2

## 2011-11-10 MED ORDER — HYDROMORPHONE HCL PF 1 MG/ML IJ SOLN: 0.2500 mg | INTRAMUSCULAR | Status: DC | PRN

## 2011-11-10 MED ORDER — CEFAZOLIN SODIUM 1-5 GM-% IV SOLN
INTRAVENOUS | Status: DC | PRN
  Administered 2011-11-10: 2 g via INTRAVENOUS

## 2011-11-10 MED ORDER — PROPOFOL 10 MG/ML IV EMUL
INTRAVENOUS | Status: DC | PRN
  Administered 2011-11-10: 50 ug/kg/min via INTRAVENOUS

## 2011-11-10 MED ORDER — LIDOCAINE-EPINEPHRINE (PF) 1 %-1:200000 IJ SOLN
INTRAMUSCULAR | Status: DC | PRN
  Administered 2011-11-10: 30 mL

## 2011-11-10 SURGICAL SUPPLY — 33 items
BLADE SURG ROTATE 9660 (MISCELLANEOUS) ×2 IMPLANT
CANISTER SUCTION 2500CC (MISCELLANEOUS) ×2 IMPLANT
CLIP TI MEDIUM 6 (CLIP) ×2 IMPLANT
CLIP TI WIDE RED SMALL 6 (CLIP) ×2 IMPLANT
CLOTH BEACON ORANGE TIMEOUT ST (SAFETY) ×2 IMPLANT
COVER PROBE W GEL 5X96 (DRAPES) IMPLANT
COVER SURGICAL LIGHT HANDLE (MISCELLANEOUS) ×4 IMPLANT
DERMABOND ADVANCED (GAUZE/BANDAGES/DRESSINGS) ×1
DERMABOND ADVANCED .7 DNX12 (GAUZE/BANDAGES/DRESSINGS) ×1 IMPLANT
ELECT REM PT RETURN 9FT ADLT (ELECTROSURGICAL) ×2
ELECTRODE REM PT RTRN 9FT ADLT (ELECTROSURGICAL) ×1 IMPLANT
GEL ULTRASOUND 20GR AQUASONIC (MISCELLANEOUS) IMPLANT
GLOVE BIO SURGEON STRL SZ 6 (GLOVE) ×2 IMPLANT
GLOVE BIO SURGEON STRL SZ 6.5 (GLOVE) ×2 IMPLANT
GLOVE BIOGEL PI IND STRL 7.0 (GLOVE) ×1 IMPLANT
GLOVE BIOGEL PI INDICATOR 7.0 (GLOVE) ×1
GLOVE SS BIOGEL STRL SZ 7 (GLOVE) ×1 IMPLANT
GLOVE SUPERSENSE BIOGEL SZ 7 (GLOVE) ×1
GOWN PREVENTION PLUS XLARGE (GOWN DISPOSABLE) ×2 IMPLANT
GOWN STRL NON-REIN LRG LVL3 (GOWN DISPOSABLE) ×8 IMPLANT
KIT BASIN OR (CUSTOM PROCEDURE TRAY) ×2 IMPLANT
KIT ROOM TURNOVER OR (KITS) ×2 IMPLANT
NS IRRIG 1000ML POUR BTL (IV SOLUTION) ×2 IMPLANT
PACK CV ACCESS (CUSTOM PROCEDURE TRAY) ×2 IMPLANT
PAD ARMBOARD 7.5X6 YLW CONV (MISCELLANEOUS) ×4 IMPLANT
SPONGE GAUZE 4X4 12PLY (GAUZE/BANDAGES/DRESSINGS) ×2 IMPLANT
SUT PROLENE 6 0 BV (SUTURE) ×2 IMPLANT
SUT VIC AB 3-0 SH 27 (SUTURE) ×2
SUT VIC AB 3-0 SH 27X BRD (SUTURE) ×1 IMPLANT
TOWEL OR 17X24 6PK STRL BLUE (TOWEL DISPOSABLE) ×2 IMPLANT
TOWEL OR 17X26 10 PK STRL BLUE (TOWEL DISPOSABLE) ×2 IMPLANT
UNDERPAD 30X30 INCONTINENT (UNDERPADS AND DIAPERS) ×2 IMPLANT
WATER STERILE IRR 1000ML POUR (IV SOLUTION) ×2 IMPLANT

## 2011-11-10 NOTE — Progress Notes (Signed)
Patient ID: Marco Arnold, male   DOB: [birth year], 46 y.o.   MRN: [id number]  Assessment/Plan   *Acute kidney injury / Progressive CKD, non oliguric with metabolic acidosis Renal USG: suggestive of medico-renal disease - secondary to uncontrolled HTN, complement levels ok, SPEP with mildly elevated kappa free light chains, lamda free light chain and ratio: likely non-specific, immunofixation  hepatitis B negative, hep C pending Renal following, VVS plans for AVG today On lasix per renal Nausea and vomiting unclear if related to uremia or heartburn: PPI trial - replace calcium Change to renal diet  Atypical CP:  resolved  Was associated with twitching of muscles in hands and around mouth Suspect secondary to hypocalcemia and uncontrolled HTN ECHO unremarkable except for mild LVH and grade 1 diast dysfunction, EF 60%  HTN :- better controlled Continue Norvasc, hydralazine,  Clonidine changed to PRN  LOW [id number] T4 ok, likely Euthyroid sick syndrome No need for supplemental thyroxine  Anemia: of chronic disease: anemia panel unremarkable, will need erythropoietin, defer to renal  Heartburn: add PPI    Subjective:  Doing well, on his way for AVF today, some nausea yesterday  Objective:  Vital signs in last 24 hours:   11/07/11 1200 11/07/11 1600  BP: 176/83 200/97  Pulse:    Temp: 97.9 F (36.6 C) 98 F (36.7 C)  TempSrc: Oral Oral  Resp:    Height:    Weight:    SpO2:      Intake/Output from previous day:   Gross per 24 hour  Intake    975 ml  Output   2150 ml  Net  -1175 ml    Physical Exam: General: Alert, awake, oriented x3, in no acute distress. HEENT: No bruits, no goiter. Moist mucous membranes, no scleral icterus, no conjunctival pallor. Heart: Regular rate and rhythm, S1/S2 +, no murmurs, rubs, gallops. Lungs: Clear to auscultation bilaterally. No wheezing, no rhonchi, no rales.  Abdomen: Soft, nontender, nondistended, positive bowel  sounds. Extremities: No clubbing or cyanosis, no pitting edema,  positive pedal pulses. Neuro: Grossly nonfocal.  Lab Results:   Lab 11/07/11 0345 11/06/11 1927  WBC 9.8 9.0  HGB 8.6* 8.7*  HCT 25.7* 26.0*  PLT 157 157  MCV 88.3 88.4    Lab 11/07/11 0345 11/06/11 1927  NA 142 142  K 3.9 4.5  CL 110 110  CO2 15* 13*  GLUCOSE 95 98  BUN 102* 100*  CREATININE 9.89* 10.33*  CALCIUM 5.8* 6.0*    Lab 11/06/11 1927  INR 1.16   Cardiac markers:  Lab 11/07/11 1138 11/07/11 0350 11/06/11 1927  CKMB 4.5* 4.4* 4.6*  TROPONINI <0.30 <0.30 <0.30    Studies/Results: US Renal 11/07/2011   IMPRESSION:  1.  Findings of medical renal disease. 2.  Bilateral renal cysts. 3. No hydronephrosis.   Dg Chest Port 1 View 11/06/2011  * IMPRESSION: Borderline cardiomegaly but no CHF or pneumonia    Medications: Scheduled Meds:   . amLODipine  5 mg Oral Daily  . calcium carbonate  1,000 mg of  calcium Oral TID WC  . calcium gluconate  1 g Intravenous Once  . calcium gluconate  1 g Intravenous Once  . enoxaparin  30 mg Subcutaneous Q24H  . mulitivitamin with minerals  1 tablet Oral Daily      LOS: 4 days   Arabella Revelle 11/10/2011, 2:18 PM  [facility] Pager: [phone]

## 2011-11-10 NOTE — Consult Note (Signed)
  Patient for creation left upper extremity AV fistula today. We'll plan distal radial artery to cephalic vein AV fistula if feasible if not we'll proceed with upper arm fistula.  Patient reexamined and risks and benefits thoroughly discussed with patient. No change in clinical status. Plan to proceed today with AV fistula left upper extremity

## 2011-11-10 NOTE — Progress Notes (Signed)
11/10/2011 10:47 AM Late Entry  Dr. Broadus John notified earlier this am of patient's two episodes of chest pain overnight last night.  Currently, patient is denying chest pain and his vitals are stable.  Pt states that the chest pain was more of a heartburn type pain and resolved quickly.  EKG obtained. No further orders received. Will continue to monitor patient. Jennette Banker

## 2011-11-10 NOTE — Transfer of Care (Signed)
Immediate Anesthesia Transfer of Care Note  Patient: Marco Arnold  Procedure(s) Performed: Procedure(s) (LRB): ARTERIOVENOUS (AV) FISTULA CREATION (Left)  Patient Location: PACU  Anesthesia Type: MAC  Level of Consciousness: awake, alert  and patient cooperative  Airway & Oxygen Therapy: Patient Spontanous Breathing  Post-op Assessment: Report given to PACU RN, Post -op Vital signs reviewed and stable and Patient moving all extremities  Post vital signs: Reviewed and stable  Complications: No apparent anesthesia complications

## 2011-11-10 NOTE — Anesthesia Postprocedure Evaluation (Signed)
  Anesthesia Post-op Note  Patient: Marco Arnold  Procedure(s) Performed: Procedure(s) (LRB): ARTERIOVENOUS (AV) FISTULA CREATION (Left)  Patient Location: PACU  Anesthesia Type: MAC  Level of Consciousness: awake, alert  and oriented  Airway and Oxygen Therapy: Patient Spontanous Breathing  Post-op Pain: mild  Post-op Assessment: Post-op Vital signs reviewed, Patient's Cardiovascular Status Stable, Respiratory Function Stable, Patent Airway, No signs of Nausea or vomiting and Pain level controlled  Post-op Vital Signs: Reviewed and stable  Complications: No apparent anesthesia complications

## 2011-11-10 NOTE — Preoperative (Signed)
Beta Blockers   Reason not to administer Beta Blockers:Carvedilol 0949 this morning

## 2011-11-10 NOTE — Op Note (Signed)
OPERATIVE REPORT  Date of Surgery: 11/06/2011 - 11/10/2011  Surgeon: Tinnie Gens, MD  Assistant: Nurse  Pre-op Diagnosis: End-stage renal disease  Post-op Diagnosis: End-stage renal disease  Procedure: Procedure(s): ARTERIOVENOUS (AV) FISTULA CREATION-left-radial-cephalic (Cimino) Anesthesia: General  EBL: Minimal  Complications: None  Procedure Details: Patient was taken to the operating room placed in the supine position at which time the left upper tremor he was prepped with Betadine scrub and solution and draped in routine sterile manner. After infiltration of 1% Xylocaine with epinephrine a short longitudinal incision was made just proximal to the wrist midway between the radial artery and cephalic vein. Cephalic vein had been imaged with the SonoSite ultrasound prior to prepping and appeared to be adequate in the forearm. Vein was carefully dissected free its branches ligated with 4-0 silk ties and divided. It was transected after ligating it distally. It was gently dilated with heparinized saline. It was between 2-1/2 and 3 mm in size throughout. The radial artery was exposed beneath the fascia. He had Pulse. It was 3 mm in size. Care was taken not to injure the radial nerve. Artery was then occluded proximally and distally with vessel loops opened with a 15 blade extended with Potts scissors. There was excellent inflow. It would accept a 3 mm dilator. Vein was carefully measured spatulated and anastomosed end to side with 6-0 Prolene. Following this there was good pulse and palpable thrill. Good Doppler flow was present up to the antecubital area. Adequate hemostasis was achieved and the wound was closed in layers with Vicryl in a subcuticular fashion. Sterile dressing applied patient taken to recovery in stable condition   Tinnie Gens, MD 11/10/2011 12:44 PM

## 2011-11-10 NOTE — Progress Notes (Signed)
11/10/11 @ 00:54 Pt stated he had CP a few minutes ago but it was gone now. Pt stated that it lasted a couple of minutes and then went away. BP 144/76 HR 74, Dr. Ascencion Dike notified via text. This morning during shift change pt stated he had chest pain again but did not tell anyone. He stated it happened again for just a few seconds and went away; he said it felt more like indigestion. Patient encouraged again to let someone know when he has CP. Patient stated that he would if it happened again. Day RN Mitchell Heir to notified MD of CP. Dudley Major RN

## 2011-11-10 NOTE — Progress Notes (Signed)
S: Feels well  No N/V  Sp Lt AVF placement O:BP 165/85  Pulse 67  Temp(Src) 97.4 F (36.3 C) (Oral)  Resp 18  Ht 6\' 3"  (1.905 m)  Wt 107 kg (235 lb 14.3 oz)  BMI 29.48 kg/m2  SpO2 94%  Intake/Output Summary (Last 24 hours) at 11/10/11 1351 Last data filed at 11/10/11 1250  Gross per 24 hour  Intake    500 ml  Output   1375 ml  Net   -875 ml   Weight change: 6.6 kg (14 lb 8.8 oz) [id number] and alert CVS:RRR Resp:clear Abd:+bs NTND Ext:no edema  Lt AVF with nice bruit NEURO:CNI no asterixis      . amLODipine  10 mg Oral Daily  . calcitRIOL  0.25 mcg Oral Daily  . calcium carbonate  1,000 mg of elemental calcium Oral TID WC  . carvedilol  12.5 mg Oral BID WC  . darbepoetin (ARANESP) injection - NON-DIALYSIS  100 mcg Subcutaneous Q Wed-1800  . ferumoxytol  1,020 mg Intravenous Once  . furosemide  40 mg Oral BID  . hydrALAZINE  25 mg Oral Q8H  . mulitivitamin with minerals  1 tablet Oral Daily  . pantoprazole  40 mg Oral Q1200  . sodium bicarbonate  1,300 mg Oral BID  . sodium chloride  3 mL Intravenous Q12H   No results found. BMET    Component Value Date/Time   NA 140 11/10/2011 0600   K 4.4 11/10/2011 0600   CL 107 11/10/2011 0600   CO2 15* 11/10/2011 0600   GLUCOSE 98 11/10/2011 0600   BUN 111* 11/10/2011 0600   CREATININE 11.28* 11/10/2011 0600   CALCIUM 7.5* 11/10/2011 0600   GFRNONAA 5* 11/10/2011 0600   GFRAA 6* 11/10/2011 0600   CBC    Component Value Date/Time   WBC 9.8 11/07/2011 0345   RBC 2.91* 11/07/2011 0345   HGB 8.6* 11/07/2011 0345   HCT 25.7* 11/07/2011 0345   PLT 157 11/07/2011 0345   MCV 88.3 11/07/2011 0345   MCH 29.6 11/07/2011 0345   MCHC 33.5 11/07/2011 0345   RDW 14.5 11/07/2011 0345   LYMPHSABS 1.1 11/06/2011 1927   MONOABS 0.6 11/06/2011 1927   EOSABS 0.1 11/06/2011 1927   BASOSABS 0.0 11/06/2011 1927     Assessment:  1. Probable CKD 5  WU neg so far, just waiting on IFE 2. HTN 3. Anemia on aranesp 4. Hypocalcemia, improved 5 Sec HPTH  on calcitriol  Plan: 1. Await IFE 2.  Anticipate DC in AM 3. Will get outpt FU appt 4.  Will try to put HD off at least until AVF is mature 5. Start PO bicarb Auriella Wieand T

## 2011-11-10 NOTE — Plan of Care (Signed)
Problem: Food- and Nutrition-Related Knowledge Deficit (NB-1.1) Goal: Nutrition education Formal process to instruct or train a patient/client in a skill or to impart knowledge to help patients/clients voluntarily manage or modify food choices and eating behavior to maintain or improve health.  Outcome: Completed/Met Date Met:  11/10/11 RD consulted for diet education. Pt stated that he has yet to receive HD and plans to be able to wait 2 more months until HD is needed. Noted renal labs: K 4.4 (WNL) and Phos 7 (High).  This RD provided Renal Disease Pyramid and reviewed necessary diet changes to make now and what will be needed once HD is initiated. Discussed foods high in potassium and phosphorus. Discussed better choices of each food group. Pt and family asked appropriate questions. Expect good compliance.  Pt is currently on Renal diet. BMI is 29.5; pt is overweight.  Re-consult RD for any additional nutrition needs.

## 2011-11-10 NOTE — Anesthesia Preprocedure Evaluation (Addendum)
Anesthesia Evaluation  Patient identified by MRN, date of birth, ID band Patient awake    Reviewed: Allergy & Precautions, H&P , NPO status , Patient's Chart, lab work & pertinent test results, reviewed documented beta blocker date and time   Airway Mallampati: II TM Distance: >3 FB Neck ROM: Full    Dental  (+) Chipped   Pulmonary neg pulmonary ROS, Current Smoker,  breath sounds clear to auscultation        Cardiovascular hypertension, Pt. on home beta blockers Rhythm:Regular Rate:Normal     Neuro/Psych    GI/Hepatic negative GI ROS, Neg liver ROS,   Endo/Other  negative endocrine ROS  Renal/GU CRFRenal disease     Musculoskeletal   Abdominal   Peds  Hematology negative hematology ROS (+)   Anesthesia Other Findings Full beard, chip front right incisor  Reproductive/Obstetrics                         Anesthesia Physical Anesthesia Plan  ASA: III  Anesthesia Plan: MAC   Post-op Pain Management:    Induction: Intravenous  Airway Management Planned: Mask  Additional Equipment:   Intra-op Plan:   Post-operative Plan:   Informed Consent:   Plan Discussed with: CRNA  Anesthesia Plan Comments:         Anesthesia Quick Evaluation

## 2011-11-11 DIAGNOSIS — I10 Essential (primary) hypertension: Secondary | ICD-10-CM | POA: Diagnosis present

## 2011-11-11 DIAGNOSIS — N2581 Secondary hyperparathyroidism of renal origin: Secondary | ICD-10-CM | POA: Diagnosis present

## 2011-11-11 DIAGNOSIS — N186 End stage renal disease: Secondary | ICD-10-CM | POA: Diagnosis present

## 2011-11-11 DIAGNOSIS — D638 Anemia in other chronic diseases classified elsewhere: Secondary | ICD-10-CM | POA: Diagnosis present

## 2011-11-11 LAB — BASIC METABOLIC PANEL
BUN: 115 mg/dL — ABNORMAL HIGH (ref 6–23)
CO2: 15 mEq/L — ABNORMAL LOW (ref 19–32)
Calcium: 7.6 mg/dL — ABNORMAL LOW (ref 8.4–10.5)
Chloride: 101 mEq/L (ref 96–112)
Creatinine, Ser: 11.75 mg/dL — ABNORMAL HIGH (ref 0.50–1.35)
GFR calc Af Amer: 5 mL/min — ABNORMAL LOW (ref >90)
GFR calc non Af Amer: 5 mL/min — ABNORMAL LOW (ref >90)
Glucose, Bld: 124 mg/dL — ABNORMAL HIGH (ref 70–99)
Potassium: 3.8 mEq/L (ref 3.5–5.1)
Sodium: 136 mEq/L (ref 135–145)

## 2011-11-11 LAB — GLUCOSE, CAPILLARY: Glucose-Capillary: 90 mg/dL (ref 70–99)

## 2011-11-11 MED ORDER — HYDRALAZINE HCL 50 MG PO TABS
50.0000 mg | ORAL_TABLET | Freq: Three times a day (TID) | ORAL | Status: DC
  Administered 2011-11-11: 50 mg via ORAL
  Filled 2011-11-11 (×3): qty 1

## 2011-11-11 MED ORDER — HYDRALAZINE HCL 50 MG PO TABS: 50.0000 mg | ORAL_TABLET | Freq: Three times a day (TID) | ORAL | Status: DC

## 2011-11-11 MED ORDER — CARVEDILOL 12.5 MG PO TABS: 12.5000 mg | ORAL_TABLET | Freq: Two times a day (BID) | ORAL | Status: DC

## 2011-11-11 MED ORDER — PANTOPRAZOLE SODIUM 40 MG PO TBEC: 40.0000 mg | DELAYED_RELEASE_TABLET | Freq: Every day | ORAL | Status: DC

## 2011-11-11 MED ORDER — AMLODIPINE BESYLATE 10 MG PO TABS: 10.0000 mg | ORAL_TABLET | Freq: Every day | ORAL | Status: DC

## 2011-11-11 MED ORDER — SODIUM BICARBONATE 650 MG PO TABS: 1300.0000 mg | ORAL_TABLET | Freq: Two times a day (BID) | ORAL | Status: DC

## 2011-11-11 MED ORDER — CALCITRIOL 0.25 MCG PO CAPS: 0.2500 ug | ORAL_CAPSULE | Freq: Every day | ORAL | Status: DC

## 2011-11-11 MED ORDER — FUROSEMIDE 40 MG PO TABS: 40.0000 mg | ORAL_TABLET | Freq: Two times a day (BID) | ORAL | Status: DC

## 2011-11-11 MED ORDER — CALCIUM CARBONATE 1250 (500 CA) MG PO TABS: 2.0000 | ORAL_TABLET | Freq: Three times a day (TID) | ORAL | Status: DC

## 2011-11-11 NOTE — Discharge Summary (Signed)
Physician Discharge Summary  Patient ID: Marco Arnold MRN: [id number] DOB/AGE: 46-Feb-1967 46 y.o.  Admit date: 11/06/2011 Discharge date: 11/11/2011  Primary Care Physician:  None Nephrologist: Marco Contras, MD [facility]   Discharge Diagnoses:   1. progressive chronic kidney disease now ESRD 2. uncontrolled hypertension 3. anemia of chronic disease 4. secondary hyperparathyroidism 5. mildly elevated Kappa free light chain immunofixation pending 6. atypical chest pain resolved 7. severe hypocalcemia with tetany resolved 8. GERD  Medication List  As of 11/11/2011  1:36 PM   TAKE these medications         amLODipine 10 MG tablet   Commonly known as: NORVASC   Take 1 tablet (10 mg total) by mouth daily.      calcitRIOL 0.25 MCG capsule   Commonly known as: ROCALTROL   Take 1 capsule (0.25 mcg total) by mouth daily.      calcium carbonate 1250 MG tablet   Commonly known as: OS-CAL - dosed in mg of elemental calcium   Take 2 tablets (1,000 mg of elemental calcium total) by mouth 4 (four) times daily -  with meals and at bedtime.      carvedilol 12.5 MG tablet   Commonly known as: COREG   Take 1 tablet (12.5 mg total) by mouth 2 (two) times daily with a meal.      furosemide 40 MG tablet   Commonly known as: LASIX   Take 1 tablet (40 mg total) by mouth 2 (two) times daily.      hydrALAZINE 50 MG tablet   Commonly known as: APRESOLINE   Take 1 tablet (50 mg total) by mouth every 8 (eight) hours.      multivitamins ther. w/minerals Tabs   Take 1 tablet by mouth daily.      pantoprazole 40 MG tablet   Commonly known as: PROTONIX   Take 1 tablet (40 mg total) by mouth daily at 12 noon.      sodium bicarbonate 650 MG tablet   Take 2 tablets (1,300 mg total) by mouth 2 (two) times daily.             Disposition and Follow-up:  Dr. Mercy Arnold on 12/14/11 or sooner in case of decompensation Dr. Kellie Arnold in 4 weeks  Consults:  1. Marco Arnold 2. Dr. Tinnie Arnold with vascular surgery  Significant Diagnostic Studies:  US Renal 11/07/2011    IMPRESSION:  1.  Findings of medical renal disease. 2.  Bilateral renal cysts. 3. No hydronephrosis.  Original Report Authenticated By: Marco Arnold, M.D.   Dg Chest Port 1 View 11/06/2011    IMPRESSION: Borderline cardiomegaly but no CHF or pneumonia  Original Report Authenticated By: Marco Arnold, M.D.   2-D echocardiogram:- Normal LV size and systolic function, EF 0000000. Mild LVH. Normal RV size and systolic function. Mild biatrial enlargement. No significant valvular abnormality.   Brief H and P: [name] is a 46 year old male with known prior medical history presented to the hospital with atypical pain around the right side of his heart associated with tingling numbness and is fingers and peri-oral area. On further evaluation he was found to have a blood pressure of greater than 220/100 and severe renal insufficiency.  Hospital Course:  Acute kidney injury/progressive chronic kidney disease, non-oliguric with metabolic acidosis. He also had anemia of chronic disease with secondary hyperparathyroidism. Hence it was felt that he had progressive chronic kidney disease. She had an extensive workup which has  been for the most part unremarkable. The etiology of his renal insufficiency is felt to be long-standing uncontrolled hypertension. Hepatitis panel was negative, complement levels were normal, his SPEP showed mildly elevated kappa free light chains, which may be nonspecific however immunofixation is pending. At this point felt by nephrology to be end-stage renal disease and hence he had an AV fistula placed by Dr. Kellie Arnold in his left radial-cephalic vein on [id number]. He has done very well from a volume standpoint, and has had very good urine output, continues on Lasix twice a day. Renal felt that, with a strict diet and medication compliance he should be able  to hold off on starting dialysis until his AV fistula matures over the next 6 weeks. Hence based on nephrology recommendations he is being discharged home to followup with Dr. Mercy Arnold on 4/18. He has been instructed regarding compliance with a renal diet and instructed to call [place] [facility] if he starts having symptoms related to uremia or volume overload. He is being discharged home on Lasix twice a day along with hydralazine and amlodipine.  Time spent on Discharge: 86min  Signed: Donne Arnold [facility]  11/11/2011, 1:36 PM

## 2011-11-11 NOTE — Progress Notes (Signed)
S: Feels well  No N/V  Sp Lt AVF placement O:BP 170/81  Pulse 79  Temp(Src) 98.3 F (36.8 C) (Oral)  Resp 17  Ht 6\' 3"  (1.905 m)  Wt 100.8 kg (222 lb 3.6 oz)  BMI 27.78 kg/m2  SpO2 98%  Intake/Output Summary (Last 24 hours) at 11/11/11 1156 Last data filed at 11/11/11 1125  Gross per 24 hour  Intake    980 ml  Output   2525 ml  Net  -1545 ml   Weight change: -6.2 kg (-13 lb 10.7 oz) [id number] and alert CVS:RRR Resp:clear Abd:+bs NTND Ext:no edema  Lt AVF with nice bruit NEURO:CNI no asterixis      . amLODipine  10 mg Oral Daily  . calcitRIOL  0.25 mcg Oral Daily  . calcium carbonate  1,000 mg of elemental calcium Oral TID WC & HS  . carvedilol  12.5 mg Oral BID WC  . darbepoetin (ARANESP) injection - NON-DIALYSIS  100 mcg Subcutaneous Q Wed-1800  . ferumoxytol  1,020 mg Intravenous Once  . furosemide  40 mg Oral BID  . hydrALAZINE  50 mg Oral Q8H  . mulitivitamin with minerals  1 tablet Oral Daily  . pantoprazole  40 mg Oral Q1200  . sodium bicarbonate  1,300 mg Oral BID  . sodium chloride  3 mL Intravenous Q12H  . DISCONTD: calcium carbonate  1,000 mg of elemental calcium Oral TID WC  . DISCONTD: hydrALAZINE  25 mg Oral Q8H   No results found. BMET    Component Value Date/Time   NA 136 11/11/2011 0605   K 3.8 11/11/2011 0605   CL 101 11/11/2011 0605   CO2 15* 11/11/2011 0605   GLUCOSE 124* 11/11/2011 0605   BUN 115* 11/11/2011 0605   CREATININE 11.75* 11/11/2011 0605   CALCIUM 7.6* 11/11/2011 0605   GFRNONAA 5* 11/11/2011 0605   GFRAA 5* 11/11/2011 0605   CBC    Component Value Date/Time   WBC 9.8 11/07/2011 0345   RBC 2.91* 11/07/2011 0345   HGB 8.6* 11/07/2011 0345   HCT 25.7* 11/07/2011 0345   PLT 157 11/07/2011 0345   MCV 88.3 11/07/2011 0345   MCH 29.6 11/07/2011 0345   MCHC 33.5 11/07/2011 0345   RDW 14.5 11/07/2011 0345   LYMPHSABS 1.1 11/06/2011 1927   MONOABS 0.6 11/06/2011 1927   EOSABS 0.1 11/06/2011 1927   BASOSABS 0.0 11/06/2011 1927      Assessment:  1. CKD V, work up negative, probably HTN'sive nephrosclerosis.  OK for D/C, his f/u appt is for April 18th at 9:00 am with Dr. Mercy Moore. I instructed the patient in symptoms to looks for related to uremia and vol overload, and to call [place] office if needed prior to April 18th and we will work him in to be seen. D/C on current lasix dose. Discussed with primary MD. 2. HTN 3. Anemia on aranesp 4. Hypocalcemia, improved 5 Sec HPTH on calcitriol 6. S/p AVF  Kelly Splinter  MD [facility] [phone] pgr    [phone] cell 11/11/2011, 11:59 AM

## 2011-11-11 NOTE — Progress Notes (Signed)
Patient ID: Marco Arnold, male   DOB: 23-Jun-1966, 46 y.o.   MRN: [id number]  Assessment/Plan   *Acute kidney injury / Progressive CKD, non oliguric with metabolic acidosis Renal USG: suggestive of medico-renal disease - secondary to uncontrolled HTN, complement levels ok, SPEP with mildly elevated kappa free light chains, lamda free light chain and ratio: likely non-specific, immunofixation pending S/p L arm AVF yesterday by Dr.Lawson BUN:115 continuing to trend up, along with creatinine; the question is whether he will be able to make it without HD till AVF matures, await renal input today  Atypical CP:  resolved  Was associated with twitching of muscles in hands and around mouth Suspect secondary to hypocalcemia and uncontrolled HTN ECHO unremarkable except for mild LVH and grade 1 diast dysfunction, EF 60%  HTN :- better controlled, but still high Continue Norvasc, increase hydralazine,  Clonidine changed to PRN  LOW [id number] T4 ok, likely Euthyroid sick syndrome No need for supplemental thyroxine  Anemia: of chronic disease: anemia panel unremarkable, will need erythropoietin, defer to renal  Heartburn: continue PPI  Disposition: per renal, if DC today per renal, needs a quick FU appointment   Subjective:  Doing well, s/p AVF yesterday  Objective:  Vital signs in last 24 hours:   11/07/11 1200 11/07/11 1600  BP: 176/83 200/97  Pulse:    Temp: 97.9 F (36.6 C) 98 F (36.7 C)  TempSrc: Oral Oral  Resp:    Height:    Weight:    SpO2:      Intake/Output from previous day:   Gross per 24 hour  Intake    975 ml  Output   2150 ml  Net  -1175 ml    Physical Exam: General: Alert, awake, oriented x3, in no acute distress. HEENT: No bruits, no goiter. Moist mucous membranes, no scleral icterus, no conjunctival pallor. Heart: Regular rate and rhythm, S1/S2 +, no murmurs, rubs, gallops. Lungs: Clear to auscultation bilaterally. No wheezing, no rhonchi, no rales.    Abdomen: Soft, nontender, nondistended, positive bowel sounds. Extremities: No clubbing or cyanosis, no pitting edema,  positive pedal pulses. LUE with AVF Neuro: Grossly nonfocal.  Lab Results:   Lab 11/07/11 0345 11/06/11 1927  WBC 9.8 9.0  HGB 8.6* 8.7*  HCT 25.7* 26.0*  PLT 157 157  MCV 88.3 88.4    Lab 11/07/11 0345 11/06/11 1927  NA 142 142  K 3.9 4.5  CL 110 110  CO2 15* 13*  GLUCOSE 95 98  BUN 102* 100*  CREATININE 9.89* 10.33*  CALCIUM 5.8* 6.0*    Lab 11/06/11 1927  INR 1.16   Cardiac markers:  Lab 11/07/11 1138 11/07/11 0350 11/06/11 1927  CKMB 4.5* 4.4* 4.6*  TROPONINI <0.30 <0.30 <0.30    Studies/Results: US Renal 11/07/2011   IMPRESSION:  1.  Findings of medical renal disease. 2.  Bilateral renal cysts. 3. No hydronephrosis.   Dg Chest Port 1 View 11/06/2011  * IMPRESSION: Borderline cardiomegaly but no CHF or pneumonia    Medications: Scheduled Meds:   . amLODipine  5 mg Oral Daily  . calcium carbonate  1,000 mg of  calcium Oral TID WC  . calcium gluconate  1 g Intravenous Once  . calcium gluconate  1 g Intravenous Once  . enoxaparin  30 mg Subcutaneous Q24H  . mulitivitamin with minerals  1 tablet Oral Daily      LOS: 5 days   Caitlyne Ingham 11/11/2011, 11:19 AM  [facility] Pager: [phone]

## 2011-11-11 NOTE — Progress Notes (Signed)
Medications and follow up appt reviewed with patient and family. Understanding verbalized. Condition stable upon departure.

## 2011-11-13 LAB — IMMUNOFIXATION ELECTROPHORESIS
IgA: 202 mg/dL (ref 68–379)
IgG (Immunoglobin G), Serum: 904 mg/dL (ref 650–1600)
IgM, Serum: 55 mg/dL — ABNORMAL LOW (ref 41–251)
Total Protein ELP: 6.3 g/dL (ref 6.0–8.3)

## 2011-11-14 ENCOUNTER — Encounter (HOSPITAL_COMMUNITY): Payer: Self-pay | Admitting: Vascular Surgery

## 2011-11-23 ENCOUNTER — Encounter (HOSPITAL_COMMUNITY)
Admission: RE | Admit: 2011-11-23 | Discharge: 2011-11-23 | Disposition: A | Discharge status: Home / Self Care | Payer: 59 | Source: Ambulatory Visit | Attending: Nephrology | Admitting: Nephrology

## 2011-11-23 DIAGNOSIS — N184 Chronic kidney disease, stage 4 (severe): Secondary | ICD-10-CM | POA: Insufficient documentation

## 2011-11-23 DIAGNOSIS — D638 Anemia in other chronic diseases classified elsewhere: Secondary | ICD-10-CM | POA: Insufficient documentation

## 2011-11-23 LAB — POCT HEMOGLOBIN-HEMACUE: Hemoglobin: 10.8 g/dL — ABNORMAL LOW (ref 13.0–17.0)

## 2011-11-23 MED ORDER — EPOETIN ALFA 20000 UNIT/ML IJ SOLN
20000.0000 [IU] | INTRAMUSCULAR | Status: DC
  Administered 2011-11-23: 20000 [IU] via SUBCUTANEOUS

## 2011-11-23 MED ORDER — EPOETIN ALFA 20000 UNIT/ML IJ SOLN
INTRAMUSCULAR | Status: AC
  Administered 2011-11-23: 20000 [IU] via SUBCUTANEOUS
  Filled 2011-11-23: qty 1

## 2011-12-04 ENCOUNTER — Encounter: Payer: Self-pay | Admitting: Vascular Surgery

## 2011-12-05 ENCOUNTER — Encounter: Payer: Self-pay | Admitting: Vascular Surgery

## 2011-12-05 ENCOUNTER — Ambulatory Visit (INDEPENDENT_AMBULATORY_CARE_PROVIDER_SITE_OTHER): Payer: 59 | Admitting: Vascular Surgery

## 2011-12-05 VITALS — BP 147/73 | HR 69 | Resp 20 | Ht 75.5 in | Wt 224.0 lb

## 2011-12-05 DIAGNOSIS — N186 End stage renal disease: Secondary | ICD-10-CM

## 2011-12-05 NOTE — Progress Notes (Signed)
Subjective:     Patient ID: Marco Arnold, male   DOB: 11/08/1965, 46 y.o.   MRN: [id number]  HPI this 46 year old male with chronic renal insufficiency is not yet on hemodialysis. He has an appointment to see Dr. Mercy Moore soon. I created a left forearm radial artery to cephalic vein AV fistula on March 11. He said no pain or numbness in the left hand.   Review of Systems     Objective:   Physical ExamBP 147/73  Pulse 69  Resp 20  Ht 6' 3.5" (1.918 m)  Wt 224 lb (101.606 kg)  BMI 27.63 kg/m2  General well-developed well-nourished male in no apparent distress Left upper extremity with 3+ pulse and thrill palpable up to the antecubital area in the fistula-Cimino Left hand is pink warm and well perfused and incision in left wrist is healing nicely There is a small branch about 5-6 cm proximal to incision but it is not affecting the flow through the fistula and compression of branch does not augment flow     Assessment:     Nicely maturing left radial artery to cephalic vein AV fistula    Plan:     Could use the fistula in mid June 2013 if necessary If small branch proximal to incision enlarges-or fistula fails to mature-this branch could be ligated. Currently this does not seem to be affecting flow through fistula as it is a small branch

## 2011-12-07 ENCOUNTER — Encounter (HOSPITAL_COMMUNITY)
Admission: RE | Admit: 2011-12-07 | Discharge: 2011-12-07 | Disposition: A | Discharge status: Home / Self Care | Payer: 59 | Source: Ambulatory Visit | Attending: Nephrology | Admitting: Nephrology

## 2011-12-07 DIAGNOSIS — N184 Chronic kidney disease, stage 4 (severe): Secondary | ICD-10-CM | POA: Insufficient documentation

## 2011-12-07 DIAGNOSIS — D638 Anemia in other chronic diseases classified elsewhere: Secondary | ICD-10-CM | POA: Insufficient documentation

## 2011-12-07 LAB — IRON AND TIBC
Iron: 97 ug/dL (ref 42–135)
Saturation Ratios: 39 % (ref 20–55)
TIBC: 247 ug/dL (ref 215–435)
UIBC: 150 ug/dL (ref 125–400)

## 2011-12-07 LAB — POCT HEMOGLOBIN-HEMACUE: Hemoglobin: 11.6 g/dL — ABNORMAL LOW (ref 13.0–17.0)

## 2011-12-07 LAB — FERRITIN: Ferritin: 442 ng/mL — ABNORMAL HIGH (ref 22–322)

## 2011-12-07 MED ORDER — EPOETIN ALFA 20000 UNIT/ML IJ SOLN
INTRAMUSCULAR | Status: AC
  Filled 2011-12-07: qty 1

## 2011-12-07 MED ORDER — EPOETIN ALFA 20000 UNIT/ML IJ SOLN
20000.0000 [IU] | INTRAMUSCULAR | Status: DC
  Administered 2011-12-07: 20000 [IU] via SUBCUTANEOUS

## 2011-12-21 ENCOUNTER — Encounter (HOSPITAL_COMMUNITY)
Admission: RE | Admit: 2011-12-21 | Discharge: 2011-12-21 | Disposition: A | Discharge status: Home / Self Care | Payer: 59 | Source: Ambulatory Visit | Attending: Nephrology | Admitting: Nephrology

## 2011-12-21 LAB — POCT HEMOGLOBIN-HEMACUE: Hemoglobin: 10.7 g/dL — ABNORMAL LOW (ref 13.0–17.0)

## 2011-12-21 MED ORDER — EPOETIN ALFA 20000 UNIT/ML IJ SOLN
INTRAMUSCULAR | Status: AC
  Administered 2011-12-21: 20000 [IU] via SUBCUTANEOUS
  Filled 2011-12-21: qty 1

## 2011-12-21 MED ORDER — EPOETIN ALFA 20000 UNIT/ML IJ SOLN
20000.0000 [IU] | INTRAMUSCULAR | Status: DC
  Administered 2011-12-21: 20000 [IU] via SUBCUTANEOUS

## 2012-01-04 ENCOUNTER — Encounter (HOSPITAL_COMMUNITY)
Admission: RE | Admit: 2012-01-04 | Discharge: 2012-01-04 | Disposition: A | Discharge status: Home / Self Care | Payer: 59 | Source: Ambulatory Visit | Attending: Nephrology | Admitting: Nephrology

## 2012-01-04 DIAGNOSIS — N184 Chronic kidney disease, stage 4 (severe): Secondary | ICD-10-CM | POA: Insufficient documentation

## 2012-01-04 DIAGNOSIS — D638 Anemia in other chronic diseases classified elsewhere: Secondary | ICD-10-CM | POA: Insufficient documentation

## 2012-01-04 LAB — POCT HEMOGLOBIN-HEMACUE: Hemoglobin: 11.7 g/dL — ABNORMAL LOW (ref 13.0–17.0)

## 2012-01-04 MED ORDER — EPOETIN ALFA 20000 UNIT/ML IJ SOLN
20000.0000 [IU] | INTRAMUSCULAR | Status: DC
  Administered 2012-01-04: 20000 [IU] via SUBCUTANEOUS
  Filled 2012-01-04: qty 1

## 2012-01-18 ENCOUNTER — Encounter (HOSPITAL_COMMUNITY)
Admission: RE | Admit: 2012-01-18 | Discharge: 2012-01-18 | Disposition: A | Discharge status: Home / Self Care | Payer: 59 | Source: Ambulatory Visit | Attending: Nephrology | Admitting: Nephrology

## 2012-01-18 LAB — POCT HEMOGLOBIN-HEMACUE: Hemoglobin: 11.4 g/dL — ABNORMAL LOW (ref 13.0–17.0)

## 2012-01-18 MED ORDER — EPOETIN ALFA 20000 UNIT/ML IJ SOLN
20000.0000 [IU] | INTRAMUSCULAR | Status: DC
  Administered 2012-01-18: 20000 [IU] via SUBCUTANEOUS

## 2012-01-18 MED ORDER — EPOETIN ALFA 20000 UNIT/ML IJ SOLN
INTRAMUSCULAR | Status: AC
  Filled 2012-01-18: qty 1

## 2012-02-01 ENCOUNTER — Inpatient Hospital Stay (HOSPITAL_COMMUNITY): Admission: RE | Admit: 2012-02-01 | Payer: 59 | Source: Ambulatory Visit

## 2012-02-06 ENCOUNTER — Other Ambulatory Visit: Payer: Self-pay | Admitting: *Deleted

## 2012-02-07 ENCOUNTER — Encounter (HOSPITAL_COMMUNITY): Payer: Self-pay | Admitting: Respiratory Therapy

## 2012-02-08 ENCOUNTER — Encounter (HOSPITAL_COMMUNITY): Payer: Self-pay | Admitting: *Deleted

## 2012-02-08 MED ORDER — DEXTROSE 5 % IV SOLN
1.5000 g | INTRAVENOUS | Status: AC
  Administered 2012-02-09: 1.5 g via INTRAVENOUS
  Filled 2012-02-08: qty 1.5

## 2012-02-08 MED ORDER — SODIUM CHLORIDE 0.9 % IV SOLN
INTRAVENOUS | Status: DC
  Administered 2012-02-09: 11:00:00 via INTRAVENOUS

## 2012-02-09 ENCOUNTER — Ambulatory Visit (HOSPITAL_COMMUNITY): Payer: Medicaid Other

## 2012-02-09 ENCOUNTER — Encounter (HOSPITAL_COMMUNITY): Payer: Self-pay

## 2012-02-09 ENCOUNTER — Encounter (HOSPITAL_COMMUNITY)
Admission: RE | Disposition: A | Discharge status: Home / Self Care | Payer: Self-pay | Source: Ambulatory Visit | Attending: Vascular Surgery

## 2012-02-09 ENCOUNTER — Encounter (HOSPITAL_COMMUNITY): Payer: Self-pay | Admitting: Certified Registered"

## 2012-02-09 ENCOUNTER — Ambulatory Visit (HOSPITAL_COMMUNITY)
Admission: RE | Admit: 2012-02-09 | Discharge: 2012-02-09 | Disposition: A | Discharge status: Home / Self Care | Payer: Medicaid Other | Source: Ambulatory Visit | Attending: Vascular Surgery | Admitting: Vascular Surgery

## 2012-02-09 DIAGNOSIS — I120 Hypertensive chronic kidney disease with stage 5 chronic kidney disease or end stage renal disease: Secondary | ICD-10-CM | POA: Insufficient documentation

## 2012-02-09 DIAGNOSIS — T82898A Other specified complication of vascular prosthetic devices, implants and grafts, initial encounter: Secondary | ICD-10-CM

## 2012-02-09 DIAGNOSIS — N186 End stage renal disease: Secondary | ICD-10-CM | POA: Insufficient documentation

## 2012-02-09 HISTORY — DX: Essential (primary) hypertension: I10

## 2012-02-09 HISTORY — PX: INSERTION OF DIALYSIS CATHETER: SHX1324

## 2012-02-09 LAB — SURGICAL PCR SCREEN
MRSA, PCR: NEGATIVE
Staphylococcus aureus: POSITIVE — AB

## 2012-02-09 LAB — PROTIME-INR
INR: 1.11 (ref 0.00–1.49)
Prothrombin Time: 14.5 seconds (ref 11.6–15.2)

## 2012-02-09 LAB — POCT I-STAT 4, (NA,K, GLUC, HGB,HCT)
Glucose, Bld: 95 mg/dL (ref 70–99)
HCT: 37 % — ABNORMAL LOW (ref 39.0–52.0)
Hemoglobin: 12.6 g/dL — ABNORMAL LOW (ref 13.0–17.0)
Potassium: 4.3 mEq/L (ref 3.5–5.1)
Sodium: 143 mEq/L (ref 135–145)

## 2012-02-09 LAB — APTT: aPTT: 32 seconds (ref 24–37)

## 2012-02-09 SURGERY — LIGATION OF COMPETING BRANCHES OF ARTERIOVENOUS FISTULA
Anesthesia: Monitor Anesthesia Care | Site: Arm Lower | Wound class: Clean

## 2012-02-09 MED ORDER — PROPOFOL 10 MG/ML IV EMUL
INTRAVENOUS | Status: DC | PRN
  Administered 2012-02-09: 120 ug/kg/min via INTRAVENOUS

## 2012-02-09 MED ORDER — HEPARIN SODIUM (PORCINE) 1000 UNIT/ML IJ SOLN
INTRAMUSCULAR | Status: DC | PRN
  Administered 2012-02-09: 4.2 mL via INTRAVENOUS

## 2012-02-09 MED ORDER — ONDANSETRON HCL 4 MG/2ML IJ SOLN
INTRAMUSCULAR | Status: DC | PRN
  Administered 2012-02-09: 4 mg via INTRAVENOUS

## 2012-02-09 MED ORDER — 0.9 % SODIUM CHLORIDE (POUR BTL) OPTIME
TOPICAL | Status: DC | PRN
  Administered 2012-02-09: 1000 mL

## 2012-02-09 MED ORDER — MIDAZOLAM HCL 5 MG/5ML IJ SOLN
INTRAMUSCULAR | Status: DC | PRN
  Administered 2012-02-09: 2 mg via INTRAVENOUS

## 2012-02-09 MED ORDER — SODIUM CHLORIDE 0.9 % IV SOLN
INTRAVENOUS | Status: DC | PRN
  Administered 2012-02-09: 11:00:00 via INTRAVENOUS

## 2012-02-09 MED ORDER — MUPIROCIN 2 % EX OINT
TOPICAL_OINTMENT | CUTANEOUS | Status: AC
  Administered 2012-02-09: 1 via NASAL
  Filled 2012-02-09: qty 22

## 2012-02-09 MED ORDER — EPHEDRINE SULFATE 50 MG/ML IJ SOLN
INTRAMUSCULAR | Status: DC | PRN
  Administered 2012-02-09 (×2): 10 mg via INTRAVENOUS
  Administered 2012-02-09: 15 mg via INTRAVENOUS

## 2012-02-09 MED ORDER — SODIUM CHLORIDE 0.9 % IR SOLN
Status: DC | PRN
  Administered 2012-02-09: 12:00:00

## 2012-02-09 MED ORDER — FENTANYL CITRATE 0.05 MG/ML IJ SOLN
INTRAMUSCULAR | Status: DC | PRN
  Administered 2012-02-09: 50 ug via INTRAVENOUS
  Administered 2012-02-09: 25 ug via INTRAVENOUS
  Administered 2012-02-09: 50 ug via INTRAVENOUS

## 2012-02-09 MED ORDER — PROPOFOL 10 MG/ML IV EMUL
INTRAVENOUS | Status: DC | PRN
  Administered 2012-02-09: 100 mg via INTRAVENOUS

## 2012-02-09 MED ORDER — LIDOCAINE-EPINEPHRINE (PF) 1 %-1:200000 IJ SOLN
INTRAMUSCULAR | Status: DC | PRN
  Administered 2012-02-09: 12.5 mL

## 2012-02-09 MED ORDER — OXYCODONE HCL 5 MG PO TABS: 5.0000 mg | ORAL_TABLET | Freq: Four times a day (QID) | ORAL | Status: AC | PRN

## 2012-02-09 SURGICAL SUPPLY — 63 items
ADH SKN CLS LQ APL DERMABOND (GAUZE/BANDAGES/DRESSINGS) ×2
BAG DECANTER FOR FLEXI CONT (MISCELLANEOUS) ×3 IMPLANT
CANISTER SUCTION 2500CC (MISCELLANEOUS) ×3 IMPLANT
CATH CANNON HEMO 15F 50CM (CATHETERS) IMPLANT
CATH CANNON HEMO 15FR 19 (HEMODIALYSIS SUPPLIES) ×3 IMPLANT
CATH CANNON HEMO 15FR 23CM (HEMODIALYSIS SUPPLIES) IMPLANT
CATH CANNON HEMO 15FR 31CM (HEMODIALYSIS SUPPLIES) IMPLANT
CATH CANNON HEMO 15FR 32CM (HEMODIALYSIS SUPPLIES) IMPLANT
CLIP TI MEDIUM 6 (CLIP) IMPLANT
CLIP TI WIDE RED SMALL 6 (CLIP) IMPLANT
CLOTH BEACON ORANGE TIMEOUT ST (SAFETY) ×3 IMPLANT
COVER PROBE W GEL 5X96 (DRAPES) ×3 IMPLANT
COVER SURGICAL LIGHT HANDLE (MISCELLANEOUS) ×6 IMPLANT
DECANTER SPIKE VIAL GLASS SM (MISCELLANEOUS) ×3 IMPLANT
DERMABOND ADHESIVE PROPEN (GAUZE/BANDAGES/DRESSINGS) ×1
DERMABOND ADVANCED (GAUZE/BANDAGES/DRESSINGS) ×1
DERMABOND ADVANCED .7 DNX12 (GAUZE/BANDAGES/DRESSINGS) ×2 IMPLANT
DERMABOND ADVANCED .7 DNX6 (GAUZE/BANDAGES/DRESSINGS) ×2 IMPLANT
DRAPE C-ARM 42X72 X-RAY (DRAPES) ×3 IMPLANT
DRAPE CHEST BREAST 15X10 FENES (DRAPES) ×6 IMPLANT
ELECT REM PT RETURN 9FT ADLT (ELECTROSURGICAL) ×3
ELECTRODE REM PT RTRN 9FT ADLT (ELECTROSURGICAL) ×2 IMPLANT
GAUZE SPONGE 2X2 8PLY STRL LF (GAUZE/BANDAGES/DRESSINGS) ×2 IMPLANT
GAUZE SPONGE 4X4 16PLY XRAY LF (GAUZE/BANDAGES/DRESSINGS) ×3 IMPLANT
GEL ULTRASOUND 20GR AQUASONIC (MISCELLANEOUS) IMPLANT
GLOVE BIOGEL PI IND STRL 6.5 (GLOVE) ×6 IMPLANT
GLOVE BIOGEL PI IND STRL 7.0 (GLOVE) ×4 IMPLANT
GLOVE BIOGEL PI INDICATOR 6.5 (GLOVE) ×3
GLOVE BIOGEL PI INDICATOR 7.0 (GLOVE) ×2
GLOVE ECLIPSE 7.0 STRL STRAW (GLOVE) ×3 IMPLANT
GLOVE SS BIOGEL STRL SZ 7 (GLOVE) ×4 IMPLANT
GLOVE SUPERSENSE BIOGEL SZ 7 (GLOVE) ×2
GOWN STRL NON-REIN LRG LVL3 (GOWN DISPOSABLE) ×15 IMPLANT
KIT BASIN OR (CUSTOM PROCEDURE TRAY) ×3 IMPLANT
KIT ROOM TURNOVER OR (KITS) ×3 IMPLANT
NEEDLE 18GX1X1/2 (RX/OR ONLY) (NEEDLE) ×3 IMPLANT
NEEDLE 22X1 1/2 (OR ONLY) (NEEDLE) ×3 IMPLANT
NEEDLE HYPO 25GX1X1/2 BEV (NEEDLE) ×3 IMPLANT
NS IRRIG 1000ML POUR BTL (IV SOLUTION) ×3 IMPLANT
PACK CV ACCESS (CUSTOM PROCEDURE TRAY) ×3 IMPLANT
PACK SURGICAL SETUP 50X90 (CUSTOM PROCEDURE TRAY) ×3 IMPLANT
PAD ARMBOARD 7.5X6 YLW CONV (MISCELLANEOUS) ×6 IMPLANT
SOAP 2 % CHG 4 OZ (WOUND CARE) ×3 IMPLANT
SPONGE GAUZE 2X2 STER 10/PKG (GAUZE/BANDAGES/DRESSINGS) ×1
SPONGE GAUZE 4X4 12PLY (GAUZE/BANDAGES/DRESSINGS) ×3 IMPLANT
SUT ETHILON 3 0 PS 1 (SUTURE) ×3 IMPLANT
SUT PROLENE 6 0 BV (SUTURE) IMPLANT
SUT SILK 0 TIES 10X30 (SUTURE) ×3 IMPLANT
SUT VIC AB 3-0 SH 27 (SUTURE) ×2
SUT VIC AB 3-0 SH 27X BRD (SUTURE) ×2 IMPLANT
SUT VICRYL 4-0 PS2 18IN ABS (SUTURE) ×6 IMPLANT
SWAB COLLECTION DEVICE MRSA (MISCELLANEOUS) IMPLANT
SYR 20CC LL (SYRINGE) ×3 IMPLANT
SYR 30ML LL (SYRINGE) IMPLANT
SYR 5ML LL (SYRINGE) ×12 IMPLANT
SYR CONTROL 10ML LL (SYRINGE) ×3 IMPLANT
SYRINGE 10CC LL (SYRINGE) ×3 IMPLANT
TAPE CLOTH SURG 4X10 WHT LF (GAUZE/BANDAGES/DRESSINGS) ×3 IMPLANT
TOWEL OR 17X24 6PK STRL BLUE (TOWEL DISPOSABLE) ×3 IMPLANT
TOWEL OR 17X26 10 PK STRL BLUE (TOWEL DISPOSABLE) ×3 IMPLANT
TUBE ANAEROBIC SPECIMEN COL (MISCELLANEOUS) IMPLANT
UNDERPAD 30X30 INCONTINENT (UNDERPADS AND DIAPERS) ×3 IMPLANT
WATER STERILE IRR 1000ML POUR (IV SOLUTION) ×3 IMPLANT

## 2012-02-09 NOTE — Preoperative (Signed)
Beta Blockers   Reason not to administer Beta Blockers:Not Applicable 

## 2012-02-09 NOTE — Transfer of Care (Signed)
Immediate Anesthesia Transfer of Care Note  Patient: Marco Arnold  Procedure(s) Performed: Procedure(s) (LRB): LIGATION OF COMPETING BRANCHES OF ARTERIOVENOUS FISTULA (Left) INSERTION OF DIALYSIS CATHETER (N/A)  Patient Location: PACU  Anesthesia Type: General  Level of Consciousness: awake, alert , oriented and patient cooperative  Airway & Oxygen Therapy: Patient Spontanous Breathing and Patient connected to nasal cannula oxygen  Post-op Assessment: Report given to PACU RN, Post -op Vital signs reviewed and stable and Patient moving all extremities  Post vital signs: Reviewed and stable  Complications: No apparent anesthesia complications

## 2012-02-09 NOTE — Consult Note (Signed)
Vascular Surgery Consultation  Reason for Consult: Competing branch left arm AV fistula  HPI: Marco Arnold is a 46 y.o. male who presents for evaluation of competing branch left arm AV fistula. Not mature yet. Needs to begin hemodialysis.   Past Medical History  Diagnosis Date  . Hypertension     dr  Tobie Lords      @ [facility]  [facility]  . Chronic kidney disease     not started dialysis  yet   Past Surgical History  Procedure Date  . Av fistula placement 11/10/2011    Procedure: ARTERIOVENOUS (AV) FISTULA CREATION;  Surgeon: Mal Misty, MD;  Location: [place];  Service: Vascular;  Laterality: Left;  Ultrasound guided   History   Social History  . Marital Status: Divorced    Spouse Name: N/A    Number of Children: N/A  . Years of Education: N/A   Social History Main Topics  . Smoking status: Current Everyday Smoker -- 25 years    Types: Cigarettes  . Smokeless tobacco: Never Used  . Alcohol Use: Yes     occ  . Drug Use: No  . Sexually Active: None   Other Topics Concern  . None   Social History Narrative  . None   Family History  Problem Relation Age of Onset  . Cancer Mother   . Diabetes Father   . Hypertension Father    No Known Allergies Prior to Admission medications   Medication Sig Start Date End Date Taking? Authorizing Provider  amLODipine (NORVASC) 10 MG tablet Take 1 tablet (10 mg total) by mouth daily. 11/11/11 11/10/12 Yes Domenic Polite, MD  calcitRIOL (ROCALTROL) 0.25 MCG capsule Take 1 capsule (0.25 mcg total) by mouth daily. 11/11/11 11/10/12 Yes Domenic Polite, MD  calcium carbonate (OS-CAL - DOSED IN MG OF ELEMENTAL CALCIUM) 1250 MG tablet Take 2 tablets by mouth 2 (two) times daily.   Yes Historical Provider, MD  carvedilol (COREG) 25 MG tablet Take 25 mg by mouth 2 (two) times daily with a meal.   Yes Historical Provider, MD  furosemide (LASIX) 40 MG tablet Take 1 tablet (40 mg total) by mouth 2 (two) times daily. 11/11/11 11/10/12 Yes Domenic Polite, MD   hydrALAZINE (APRESOLINE) 50 MG tablet Take 1 tablet (50 mg total) by mouth every 8 (eight) hours. 11/11/11 11/10/12 Yes Domenic Polite, MD  Multiple Vitamins-Minerals (MULTIVITAMINS THER. W/MINERALS) TABS Take 1 tablet by mouth daily.   Yes Historical Provider, MD  pantoprazole (PROTONIX) 40 MG tablet Take 1 tablet (40 mg total) by mouth daily at 12 noon. 11/11/11 11/10/12 Yes Domenic Polite, MD  sodium bicarbonate 650 MG tablet Take 2 tablets (1,300 mg total) by mouth 2 (two) times daily. 11/11/11 11/10/12 Yes Domenic Polite, MD     Positive ROS: No new symptom  All other systems have been reviewed and were otherwise negative with the exception of those mentioned in the HPI and as above.  Physical Exam: There were no vitals filed for this visit.  General: Alert, no acute distress HEENT: Normal for age Cardiovascular: Regular rate and rhythm. Carotid pulses 2+, no bruits audible Respiratory: Clear to auscultation. No cyanosis, no use of accessory musculature GI: No organomegaly, abdomen is soft and non-tender Skin: No lesions in the area of chief complaint Neurologic: Sensation intact distally Psychiatric: Patient is competent for consent with normal mood and affect Musculoskeletal: No obvious deformities Extremities: Left upper extremity with AV fistula in the upper arm. Small branch in the distal  upper arm. Possibly affecting maturation of fistula.      Assessment/Plan:  #1 ligation competing branch left arm AV fistula #2 insertion of hemodialysis catheter   Tinnie Gens, MD 02/09/2012 7:40 AM

## 2012-02-09 NOTE — Discharge Instructions (Signed)
    02/09/2012 Marco Arnold [id number] 12/22/1965  Surgeon(s): Mal Misty, MD  Procedure(s): LIGATION OF COMPETING BRANCHES OF ARTERIOVENOUS FISTULA INSERTION OF DIALYSIS CATHETER-right IJ        x Do not stick graft for 4 weeks

## 2012-02-09 NOTE — Anesthesia Postprocedure Evaluation (Signed)
  Anesthesia Post-op Note  Patient: Marco Arnold  Procedure(s) Performed: Procedure(s) (LRB): LIGATION OF COMPETING BRANCHES OF ARTERIOVENOUS FISTULA (Left) INSERTION OF DIALYSIS CATHETER (N/A)  Patient Location: PACU  Anesthesia Type: General  Level of Consciousness: awake and alert   Airway and Oxygen Therapy: Patient Spontanous Breathing  Post-op Pain: mild  Post-op Assessment: Post-op Vital signs reviewed, Patient's Cardiovascular Status Stable, Respiratory Function Stable, Patent Airway, No signs of Nausea or vomiting and Pain level controlled  Post-op Vital Signs: stable  Complications: No apparent anesthesia complications

## 2012-02-09 NOTE — OR Nursing (Signed)
Procedure 1 Dialysis Catheter Insertion end at 1158.

## 2012-02-09 NOTE — OR Nursing (Signed)
Ligation of competing branches left arm procedure start time 1220

## 2012-02-09 NOTE — Anesthesia Postprocedure Evaluation (Signed)
Anesthesia Post Note  Patient: Marco Arnold  Procedure(s) Performed: Procedure(s) (LRB): LIGATION OF COMPETING BRANCHES OF ARTERIOVENOUS FISTULA (Left) INSERTION OF DIALYSIS CATHETER (N/A)  Anesthesia type: general  Patient location: PACU  Post pain: Pain level controlled  Post assessment: Patient's Cardiovascular Status Stable  Last Vitals:  Filed Vitals:   02/09/12 0818  BP: 111/69  Pulse: 63  Temp: 36.4 C  Resp: 18    Post vital signs: Reviewed and stable  Level of consciousness: sedated  Complications: No apparent anesthesia complications

## 2012-02-09 NOTE — Anesthesia Preprocedure Evaluation (Addendum)
Anesthesia Evaluation  Patient identified by MRN, date of birth, ID band Patient awake    Reviewed: Allergy & Precautions, H&P , NPO status , Patient's Chart, lab work & pertinent test results, reviewed documented beta blocker date and time   Airway Mallampati: II TM Distance: >3 FB Neck ROM: Full    Dental  (+) Teeth Intact   Pulmonary          Cardiovascular hypertension,     Neuro/Psych    GI/Hepatic   Endo/Other    Renal/GU Renal InsufficiencyRenal disease     Musculoskeletal   Abdominal   Peds  Hematology   Anesthesia Other Findings   Reproductive/Obstetrics                          Anesthesia Physical Anesthesia Plan  ASA: III  Anesthesia Plan: MAC   Post-op Pain Management:    Induction:   Airway Management Planned: Simple Face Mask  Additional Equipment:   Intra-op Plan:   Post-operative Plan:   Informed Consent: I have reviewed the patients History and Physical, chart, labs and discussed the procedure including the risks, benefits and alternatives for the proposed anesthesia with the patient or authorized representative who has indicated his/her understanding and acceptance.   Dental advisory given and History available from chart only  Plan Discussed with: Anesthesiologist and Surgeon  Anesthesia Plan Comments:         Anesthesia Quick Evaluation

## 2012-02-09 NOTE — Anesthesia Procedure Notes (Signed)
Procedure Name: LMA Insertion Date/Time: 02/09/2012 11:42 AM Performed by: Julian Reil Pre-anesthesia Checklist: Patient identified, Suction available, Emergency Drugs available and Patient being monitored Patient Re-evaluated:Patient Re-evaluated prior to inductionOxygen Delivery Method: Circle system utilized Preoxygenation: Pre-oxygenation with 100% oxygen Intubation Type: IV induction LMA: LMA inserted LMA Size: 4.0 Tube type: Oral Number of attempts: 1 Placement Confirmation: positive ETCO2 and breath sounds checked- equal and bilateral Tube secured with: Tape Dental Injury: Teeth and Oropharynx as per pre-operative assessment

## 2012-02-09 NOTE — Op Note (Signed)
OPERATIVE REPORT  Date of Surgery: 02/09/2012  Surgeon: Tinnie Gens, MD  Assistant: Nurse  Pre-op Diagnosis: End stage renal disease with possible significant competing branch left radial-cephalic AV fistula  Post-op Diagnosis:/  Procedure: Procedure(s): LIGATION OF COMPETING BRANCH OF ARTERIOVENOUS FISTULA left radial-cephalic INSERTION OF DIALYSIS CATHETER-right IJ-19 cm Bilateral ultrasound localization internal jugular veins  Anesthesia: General  EBL: Minimal  Complications: None  Procedure Details: Patient was taken to the operating room placed in the supine position at which time the upper chest and neck for exposed. Using the mother ultrasound both internal jugular veins were image both noted to be widely patent. After prepping and draping in routine sterile manner the right internal jugular line was entered using a supraclavicular approach guidewire passed into the right atrium under fluoroscopic guidance. After dilating the tract appropriately a 19 cm.the catheter was passed through a peel-away sheath position in the right atrium, peripheral and secured nylon sutures. Wound was closed with Vicryl the subcuticular fashion sterile dressing applied.  Following this the left upper extremity was prepped with Betadine scrub and solution draped in a routine sterile manner. There was one significant competing branch originating about 4-5 cm distal to the anastomosis at the radial artery level. Using B. mode ultrasound the branch was identified and marked. The remainder of the cephalic 9 in the forearm had no significant branches and was of large caliber. After infiltration of 1% Xylocaine a short oblique incision was made 15 blade branch identified and ligated with 2 3-0 silk ties skin closed with 3-0 Vicryl in a subcuticular fashion plus abnormal on pledgets to the recovery room in stable condition for chest x-ray  Tinnie Gens, MD 02/09/2012 12:46 PM

## 2012-02-12 ENCOUNTER — Encounter (HOSPITAL_COMMUNITY): Payer: Self-pay | Admitting: Vascular Surgery

## 2012-04-18 ENCOUNTER — Other Ambulatory Visit: Payer: Self-pay

## 2012-04-18 DIAGNOSIS — T82598A Other mechanical complication of other cardiac and vascular devices and implants, initial encounter: Secondary | ICD-10-CM

## 2012-04-30 DIAGNOSIS — Z992 Dependence on renal dialysis: Secondary | ICD-10-CM | POA: Diagnosis not present

## 2012-04-30 DIAGNOSIS — N186 End stage renal disease: Secondary | ICD-10-CM | POA: Diagnosis not present

## 2012-04-30 DIAGNOSIS — N039 Chronic nephritic syndrome with unspecified morphologic changes: Secondary | ICD-10-CM | POA: Diagnosis not present

## 2012-04-30 DIAGNOSIS — D509 Iron deficiency anemia, unspecified: Secondary | ICD-10-CM | POA: Diagnosis not present

## 2012-04-30 DIAGNOSIS — D631 Anemia in chronic kidney disease: Secondary | ICD-10-CM | POA: Diagnosis not present

## 2012-04-30 DIAGNOSIS — N2581 Secondary hyperparathyroidism of renal origin: Secondary | ICD-10-CM | POA: Diagnosis not present

## 2012-05-02 DIAGNOSIS — N2581 Secondary hyperparathyroidism of renal origin: Secondary | ICD-10-CM | POA: Diagnosis not present

## 2012-05-02 DIAGNOSIS — D631 Anemia in chronic kidney disease: Secondary | ICD-10-CM | POA: Diagnosis not present

## 2012-05-02 DIAGNOSIS — D509 Iron deficiency anemia, unspecified: Secondary | ICD-10-CM | POA: Diagnosis not present

## 2012-05-02 DIAGNOSIS — Z992 Dependence on renal dialysis: Secondary | ICD-10-CM | POA: Diagnosis not present

## 2012-05-02 DIAGNOSIS — N186 End stage renal disease: Secondary | ICD-10-CM | POA: Diagnosis not present

## 2012-05-04 DIAGNOSIS — N186 End stage renal disease: Secondary | ICD-10-CM | POA: Diagnosis not present

## 2012-05-04 DIAGNOSIS — Z992 Dependence on renal dialysis: Secondary | ICD-10-CM | POA: Diagnosis not present

## 2012-05-04 DIAGNOSIS — D631 Anemia in chronic kidney disease: Secondary | ICD-10-CM | POA: Diagnosis not present

## 2012-05-04 DIAGNOSIS — N2581 Secondary hyperparathyroidism of renal origin: Secondary | ICD-10-CM | POA: Diagnosis not present

## 2012-05-04 DIAGNOSIS — D509 Iron deficiency anemia, unspecified: Secondary | ICD-10-CM | POA: Diagnosis not present

## 2012-05-07 DIAGNOSIS — D509 Iron deficiency anemia, unspecified: Secondary | ICD-10-CM | POA: Diagnosis not present

## 2012-05-07 DIAGNOSIS — N2581 Secondary hyperparathyroidism of renal origin: Secondary | ICD-10-CM | POA: Diagnosis not present

## 2012-05-07 DIAGNOSIS — Z992 Dependence on renal dialysis: Secondary | ICD-10-CM | POA: Diagnosis not present

## 2012-05-07 DIAGNOSIS — D631 Anemia in chronic kidney disease: Secondary | ICD-10-CM | POA: Diagnosis not present

## 2012-05-07 DIAGNOSIS — N186 End stage renal disease: Secondary | ICD-10-CM | POA: Diagnosis not present

## 2012-05-09 ENCOUNTER — Encounter: Payer: Self-pay | Admitting: Vascular Surgery

## 2012-05-09 DIAGNOSIS — D631 Anemia in chronic kidney disease: Secondary | ICD-10-CM | POA: Diagnosis not present

## 2012-05-09 DIAGNOSIS — D509 Iron deficiency anemia, unspecified: Secondary | ICD-10-CM | POA: Diagnosis not present

## 2012-05-09 DIAGNOSIS — Z992 Dependence on renal dialysis: Secondary | ICD-10-CM | POA: Diagnosis not present

## 2012-05-09 DIAGNOSIS — N186 End stage renal disease: Secondary | ICD-10-CM | POA: Diagnosis not present

## 2012-05-09 DIAGNOSIS — N2581 Secondary hyperparathyroidism of renal origin: Secondary | ICD-10-CM | POA: Diagnosis not present

## 2012-05-10 ENCOUNTER — Ambulatory Visit: Payer: Medicaid Other | Admitting: Vascular Surgery

## 2012-05-11 DIAGNOSIS — N186 End stage renal disease: Secondary | ICD-10-CM | POA: Diagnosis not present

## 2012-05-11 DIAGNOSIS — D509 Iron deficiency anemia, unspecified: Secondary | ICD-10-CM | POA: Diagnosis not present

## 2012-05-11 DIAGNOSIS — D631 Anemia in chronic kidney disease: Secondary | ICD-10-CM | POA: Diagnosis not present

## 2012-05-11 DIAGNOSIS — Z992 Dependence on renal dialysis: Secondary | ICD-10-CM | POA: Diagnosis not present

## 2012-05-11 DIAGNOSIS — N2581 Secondary hyperparathyroidism of renal origin: Secondary | ICD-10-CM | POA: Diagnosis not present

## 2012-05-14 DIAGNOSIS — D631 Anemia in chronic kidney disease: Secondary | ICD-10-CM | POA: Diagnosis not present

## 2012-05-14 DIAGNOSIS — N186 End stage renal disease: Secondary | ICD-10-CM | POA: Diagnosis not present

## 2012-05-14 DIAGNOSIS — N039 Chronic nephritic syndrome with unspecified morphologic changes: Secondary | ICD-10-CM | POA: Diagnosis not present

## 2012-05-14 DIAGNOSIS — N2581 Secondary hyperparathyroidism of renal origin: Secondary | ICD-10-CM | POA: Diagnosis not present

## 2012-05-14 DIAGNOSIS — Z992 Dependence on renal dialysis: Secondary | ICD-10-CM | POA: Diagnosis not present

## 2012-05-14 DIAGNOSIS — D509 Iron deficiency anemia, unspecified: Secondary | ICD-10-CM | POA: Diagnosis not present

## 2012-05-16 DIAGNOSIS — D631 Anemia in chronic kidney disease: Secondary | ICD-10-CM | POA: Diagnosis not present

## 2012-05-16 DIAGNOSIS — D509 Iron deficiency anemia, unspecified: Secondary | ICD-10-CM | POA: Diagnosis not present

## 2012-05-16 DIAGNOSIS — N186 End stage renal disease: Secondary | ICD-10-CM | POA: Diagnosis not present

## 2012-05-16 DIAGNOSIS — N039 Chronic nephritic syndrome with unspecified morphologic changes: Secondary | ICD-10-CM | POA: Diagnosis not present

## 2012-05-16 DIAGNOSIS — Z992 Dependence on renal dialysis: Secondary | ICD-10-CM | POA: Diagnosis not present

## 2012-05-16 DIAGNOSIS — N2581 Secondary hyperparathyroidism of renal origin: Secondary | ICD-10-CM | POA: Diagnosis not present

## 2012-05-18 DIAGNOSIS — D631 Anemia in chronic kidney disease: Secondary | ICD-10-CM | POA: Diagnosis not present

## 2012-05-18 DIAGNOSIS — N186 End stage renal disease: Secondary | ICD-10-CM | POA: Diagnosis not present

## 2012-05-18 DIAGNOSIS — N2581 Secondary hyperparathyroidism of renal origin: Secondary | ICD-10-CM | POA: Diagnosis not present

## 2012-05-18 DIAGNOSIS — D509 Iron deficiency anemia, unspecified: Secondary | ICD-10-CM | POA: Diagnosis not present

## 2012-05-18 DIAGNOSIS — Z992 Dependence on renal dialysis: Secondary | ICD-10-CM | POA: Diagnosis not present

## 2012-05-18 DIAGNOSIS — N039 Chronic nephritic syndrome with unspecified morphologic changes: Secondary | ICD-10-CM | POA: Diagnosis not present

## 2012-05-21 DIAGNOSIS — N186 End stage renal disease: Secondary | ICD-10-CM | POA: Diagnosis not present

## 2012-05-21 DIAGNOSIS — N039 Chronic nephritic syndrome with unspecified morphologic changes: Secondary | ICD-10-CM | POA: Diagnosis not present

## 2012-05-21 DIAGNOSIS — Z992 Dependence on renal dialysis: Secondary | ICD-10-CM | POA: Diagnosis not present

## 2012-05-21 DIAGNOSIS — N2581 Secondary hyperparathyroidism of renal origin: Secondary | ICD-10-CM | POA: Diagnosis not present

## 2012-05-21 DIAGNOSIS — D509 Iron deficiency anemia, unspecified: Secondary | ICD-10-CM | POA: Diagnosis not present

## 2012-05-21 DIAGNOSIS — D631 Anemia in chronic kidney disease: Secondary | ICD-10-CM | POA: Diagnosis not present

## 2012-05-23 DIAGNOSIS — Z992 Dependence on renal dialysis: Secondary | ICD-10-CM | POA: Diagnosis not present

## 2012-05-23 DIAGNOSIS — D509 Iron deficiency anemia, unspecified: Secondary | ICD-10-CM | POA: Diagnosis not present

## 2012-05-23 DIAGNOSIS — N186 End stage renal disease: Secondary | ICD-10-CM | POA: Diagnosis not present

## 2012-05-23 DIAGNOSIS — D631 Anemia in chronic kidney disease: Secondary | ICD-10-CM | POA: Diagnosis not present

## 2012-05-23 DIAGNOSIS — N2581 Secondary hyperparathyroidism of renal origin: Secondary | ICD-10-CM | POA: Diagnosis not present

## 2012-05-25 DIAGNOSIS — D509 Iron deficiency anemia, unspecified: Secondary | ICD-10-CM | POA: Diagnosis not present

## 2012-05-25 DIAGNOSIS — Z992 Dependence on renal dialysis: Secondary | ICD-10-CM | POA: Diagnosis not present

## 2012-05-25 DIAGNOSIS — D631 Anemia in chronic kidney disease: Secondary | ICD-10-CM | POA: Diagnosis not present

## 2012-05-25 DIAGNOSIS — N2581 Secondary hyperparathyroidism of renal origin: Secondary | ICD-10-CM | POA: Diagnosis not present

## 2012-05-25 DIAGNOSIS — N186 End stage renal disease: Secondary | ICD-10-CM | POA: Diagnosis not present

## 2012-05-27 DIAGNOSIS — N186 End stage renal disease: Secondary | ICD-10-CM | POA: Diagnosis not present

## 2012-05-28 DIAGNOSIS — N2581 Secondary hyperparathyroidism of renal origin: Secondary | ICD-10-CM | POA: Diagnosis not present

## 2012-05-28 DIAGNOSIS — D509 Iron deficiency anemia, unspecified: Secondary | ICD-10-CM | POA: Diagnosis not present

## 2012-05-28 DIAGNOSIS — D631 Anemia in chronic kidney disease: Secondary | ICD-10-CM | POA: Diagnosis not present

## 2012-05-28 DIAGNOSIS — N186 End stage renal disease: Secondary | ICD-10-CM | POA: Diagnosis not present

## 2012-05-30 ENCOUNTER — Encounter: Payer: Self-pay | Admitting: Vascular Surgery

## 2012-05-31 ENCOUNTER — Encounter (HOSPITAL_COMMUNITY): Payer: Self-pay | Admitting: Pharmacy Technician

## 2012-05-31 ENCOUNTER — Ambulatory Visit (INDEPENDENT_AMBULATORY_CARE_PROVIDER_SITE_OTHER): Payer: Medicare Other | Admitting: Vascular Surgery

## 2012-05-31 ENCOUNTER — Encounter: Payer: Self-pay | Admitting: Vascular Surgery

## 2012-05-31 ENCOUNTER — Encounter (INDEPENDENT_AMBULATORY_CARE_PROVIDER_SITE_OTHER): Payer: Medicare Other

## 2012-05-31 ENCOUNTER — Other Ambulatory Visit: Payer: Self-pay

## 2012-05-31 VITALS — BP 133/95 | HR 83 | Resp 18 | Ht 75.0 in | Wt 239.0 lb

## 2012-05-31 DIAGNOSIS — N186 End stage renal disease: Secondary | ICD-10-CM

## 2012-05-31 DIAGNOSIS — T82898A Other specified complication of vascular prosthetic devices, implants and grafts, initial encounter: Secondary | ICD-10-CM | POA: Diagnosis not present

## 2012-05-31 NOTE — Progress Notes (Signed)
[facility] [place]  Established Dialysis Access  History of Present Illness  Marco Arnold is a 46 y.o. (Nov 25, 1965) male who presents for re-evaluation of his L RC AVF.  The patient underwent a L RC AVF placement in 02/09/12.  They have attempted cannulation once, resulting in infilitration.  He denies any steal sx.  He currently is dialyzing from [facility].  Pt has been lost to follow-up  Past Medical History, Past Surgical History, Social History, Family History, Medications, Allergies, and Review of Systems are unchanged from previous visit on 02/09/12.  Physical Examination  Filed Vitals:   05/31/12 1002  BP: 133/95  Pulse: 83  Resp: 18  Height: 6\' 3"  (1.905 m)  Weight: 239 lb (108.41 kg)   Body mass index is 29.87 kg/(m^2).  General: A&O x 3, WDWN  Pulmonary: Sym exp, good air movt, CTAB, no rales, rhonchi, & wheezing  Cardiac: RRR, Nl S1, S2, no Murmurs, rubs or gallops  Gastrointestinal: soft, NTND, -G/R, - HSM, - masses, - CVAT B  Musculoskeletal: M/S 5/5 throughout , Extremities without  ischemic changes , palpable thrill and bruit in L RC AVF  Neurologic: Pain and light touch intact in extremities , Motor exam as listed above  Non-Invasive Vascular Imaging  L arm access duplex (Date: 05/31/12):   Occlusion in proximal cephalic vein with drainage via deep system  Medical Decision Making  [name] is a 46 y.o. male who presents with ESRD requiring hemodialysis, malfunctioning L RC AVF  Based on the access duplex, I recommend L arm fistulogram, possible percutaneous thrombectomy of L RC AVF  It is not clear to me if this fistula clotted due to chronic stenosis in the antecubital segment, which is frequently cannulated for IV and blood draws.  If so, the recannulation is unlikely to have much lasting patency. I discussed with the patient the nature of angiographic procedures, especially the limited patencies of any endovascular intervention.   The patient is aware of that the risks of an angiographic procedure include but are not limited to: bleeding, infection, access site complications, renal failure, embolization, rupture of vessel, dissection, possible need for emergent surgical intervention, possible need for surgical procedures to treat the patient's pathology, and stroke and death.   The patient is aware of the risks and agrees to proceed. This procedure is scheduled for Monday 7 OCT [place], MD [facility] [place] Office: [phone] Pager: [phone]  05/31/2012, 10:33 AM

## 2012-06-03 ENCOUNTER — Encounter (HOSPITAL_COMMUNITY)
Admission: RE | Disposition: A | Discharge status: Home / Self Care | Payer: Self-pay | Source: Ambulatory Visit | Attending: Vascular Surgery

## 2012-06-03 ENCOUNTER — Ambulatory Visit (HOSPITAL_COMMUNITY)
Admission: RE | Admit: 2012-06-03 | Discharge: 2012-06-03 | Disposition: A | Discharge status: Home / Self Care | Payer: Medicare Other | Source: Ambulatory Visit | Attending: Vascular Surgery | Admitting: Vascular Surgery

## 2012-06-03 ENCOUNTER — Other Ambulatory Visit: Payer: Self-pay | Admitting: *Deleted

## 2012-06-03 DIAGNOSIS — T82898A Other specified complication of vascular prosthetic devices, implants and grafts, initial encounter: Secondary | ICD-10-CM

## 2012-06-03 DIAGNOSIS — N189 Chronic kidney disease, unspecified: Secondary | ICD-10-CM | POA: Diagnosis not present

## 2012-06-03 DIAGNOSIS — I871 Compression of vein: Secondary | ICD-10-CM | POA: Diagnosis not present

## 2012-06-03 DIAGNOSIS — I129 Hypertensive chronic kidney disease with stage 1 through stage 4 chronic kidney disease, or unspecified chronic kidney disease: Secondary | ICD-10-CM | POA: Insufficient documentation

## 2012-06-03 DIAGNOSIS — T82598A Other mechanical complication of other cardiac and vascular devices and implants, initial encounter: Secondary | ICD-10-CM | POA: Diagnosis not present

## 2012-06-03 DIAGNOSIS — Y832 Surgical operation with anastomosis, bypass or graft as the cause of abnormal reaction of the patient, or of later complication, without mention of misadventure at the time of the procedure: Secondary | ICD-10-CM | POA: Insufficient documentation

## 2012-06-03 HISTORY — PX: SHUNTOGRAM: SHX5491

## 2012-06-03 LAB — POCT I-STAT, CHEM 8
BUN: 81 mg/dL — ABNORMAL HIGH (ref 6–23)
Calcium, Ion: 1.24 mmol/L — ABNORMAL HIGH (ref 1.12–1.23)
Chloride: 103 mEq/L (ref 96–112)
Creatinine, Ser: 11.7 mg/dL — ABNORMAL HIGH (ref 0.50–1.35)
Glucose, Bld: 91 mg/dL (ref 70–99)
HCT: 32 % — ABNORMAL LOW (ref 39.0–52.0)
Hemoglobin: 10.9 g/dL — ABNORMAL LOW (ref 13.0–17.0)
Potassium: 4.4 mEq/L (ref 3.5–5.1)
Sodium: 136 mEq/L (ref 135–145)
TCO2: 21 mmol/L (ref 0–100)

## 2012-06-03 LAB — GLUCOSE, CAPILLARY: Glucose-Capillary: 115 mg/dL — ABNORMAL HIGH (ref 70–99)

## 2012-06-03 SURGERY — ASSESSMENT, SHUNT FUNCTION, WITH CONTRAST RADIOGRAPHIC STUDY
Anesthesia: LOCAL | Laterality: Left

## 2012-06-03 MED ORDER — ACETAMINOPHEN 325 MG PO TABS: 650.0000 mg | ORAL_TABLET | ORAL | Status: DC | PRN

## 2012-06-03 MED ORDER — SODIUM CHLORIDE 0.9 % IV SOLN: 250.0000 mL | INTRAVENOUS | Status: DC

## 2012-06-03 MED ORDER — SODIUM CHLORIDE 0.9 % IJ SOLN: 3.0000 mL | INTRAMUSCULAR | Status: DC | PRN

## 2012-06-03 MED ORDER — HEPARIN (PORCINE) IN NACL 2-0.9 UNIT/ML-% IJ SOLN
INTRAMUSCULAR | Status: AC
  Filled 2012-06-03: qty 500

## 2012-06-03 MED ORDER — LIDOCAINE HCL (PF) 1 % IJ SOLN
INTRAMUSCULAR | Status: AC
  Filled 2012-06-03: qty 30

## 2012-06-03 MED ORDER — ONDANSETRON HCL 4 MG/2ML IJ SOLN: 4.0000 mg | Freq: Four times a day (QID) | INTRAMUSCULAR | Status: DC | PRN

## 2012-06-03 MED ORDER — SODIUM CHLORIDE 0.9 % IJ SOLN: 3.0000 mL | Freq: Two times a day (BID) | INTRAMUSCULAR | Status: DC

## 2012-06-03 NOTE — H&P (Signed)
[facility] [place]  Brief History and Physical  History of Present Illness  Marco Arnold is a 46 y.o. male who presents with chief complaint: occluded outflow for L RC AVF.  The patient presents today for L arm fistulogram, possible intervention.    Past Medical History  Diagnosis Date  . Hypertension     dr  Marco Arnold      @ [facility]  med  . Chronic kidney disease     not started dialysis  yet    Past Surgical History  Procedure Date  . Av fistula placement 11/10/2011    Procedure: ARTERIOVENOUS (AV) FISTULA CREATION;  Surgeon: Marco Misty, MD;  Location: [place];  Service: Vascular;  Laterality: Left;  Ultrasound guided  . Insertion of dialysis catheter 02/09/2012    Procedure: INSERTION OF DIALYSIS CATHETER;  Surgeon: Marco Misty, MD;  Location: [place];  Service: Vascular;  Laterality: N/A;    History   Social History  . Marital Status: Divorced    Spouse Name: N/A    Number of Children: N/A  . Years of Education: N/A   Occupational History  . Not on file.   Social History Main Topics  . Smoking status: Current Every Day Smoker -- 25 years    Types: Cigarettes  . Smokeless tobacco: Never Used  . Alcohol Use: Yes     occ  . Drug Use: No  . Sexually Active: Not on file   Other Topics Concern  . Not on file   Social History Narrative  . No narrative on file    Family History  Problem Relation Age of Onset  . Cancer Mother   . Diabetes Father   . Hypertension Father     No current facility-administered medications on file prior to encounter.   Current Outpatient Prescriptions on File Prior to Encounter  Medication Sig Dispense Refill  . Multiple Vitamins-Minerals (MULTIVITAMINS THER. W/MINERALS) TABS Take 1 tablet by mouth daily.      Marco Arnold Kitchen amLODipine (NORVASC) 10 MG tablet Take 1 tablet (10 mg total) by mouth daily.  30 tablet  0  . calcitRIOL (ROCALTROL) 0.25 MCG capsule Take 1 capsule (0.25 mcg total) by mouth daily.  30 capsule  0    . calcium carbonate (OS-CAL - DOSED IN MG OF ELEMENTAL CALCIUM) 1250 MG tablet Take 2 tablets by mouth 2 (two) times daily.      . carvedilol (COREG) 25 MG tablet Take 25 mg by mouth 2 (two) times daily with a meal.      . furosemide (LASIX) 40 MG tablet Take 1 tablet (40 mg total) by mouth 2 (two) times daily.  30 tablet  0  . hydrALAZINE (APRESOLINE) 50 MG tablet Take 1 tablet (50 mg total) by mouth every 8 (eight) hours.  60 tablet  1  . pantoprazole (PROTONIX) 40 MG tablet Take 1 tablet (40 mg total) by mouth daily at 12 noon.  30 tablet  0  . sodium bicarbonate 650 MG tablet Take 2 tablets (1,300 mg total) by mouth 2 (two) times daily.  30 tablet  0    No Known Allergies  Review of Systems: As listed above, otherwise negative.  Physical Examination  Filed Vitals:   06/03/12 1035  BP: 141/87  Pulse: 71  Temp: 97.7 F (36.5 C)  TempSrc: Oral  Resp: 18  Height: 6' 3.5" (1.918 m)  Weight: 239 lb (108.41 kg)  SpO2: 97%    General: A&O x  3, WDWN  Pulmonary: Sym exp, good air movt, CTAB, no rales, rhonchi, & wheezing  Cardiac: RRR, Nl S1, S2, no Murmurs, rubs or gallops  Gastrointestinal: soft, NTND, -G/R, - HSM, - masses, - CVAT B  Musculoskeletal: M/S 5/5 throughout , Extremities without ischemic changes , palpable thrill and bruit in L arm  Laboratory See Bromide is a 46 y.o. male who presents with: occluded venous outflow in L RC AVF.   The patient is scheduled for: L arm fistulogram, possible intervention I discussed with the patient the nature of angiographic procedures, especially the limited patencies of any endovascular intervention.  The patient is aware of that the risks of an angiographic procedure include but are not limited to: bleeding, infection, access site complications, renal failure, embolization, rupture of vessel, dissection, possible need for emergent surgical intervention, possible need for surgical procedures  to treat the patient's pathology, and stroke and death.    The patient is aware of the risks and agrees to proceed.  Marco Barthel, MD [facility] [place] Office: [phone] Pager: [phone]  06/03/2012, 7:33 AM

## 2012-06-03 NOTE — Op Note (Signed)
OPERATIVE NOTE   PROCEDURE: 1.  left radiocephalic arteriovenous fistula cannulation under ultrasound guidance 2.  left arm fistulogram  PRE-OPERATIVE DIAGNOSIS: Malfunctioning left arteriovenous fistula  POST-OPERATIVE DIAGNOSIS: same as above   SURGEON: Adele Barthel, MD  ANESTHESIA: local  ESTIMATED BLOOD LOSS: 5 cc  FINDING(S): 1. Occluded upper arm cephalic vein 2. Drainage via brachial and basilic vein (5.5 mm) 3. Patent left axillary, subclavian, and innominate veins  SPECIMEN(S):  None  CONTRAST: 30 cc  INDICATIONS: Marco Arnold is a 46 y.o. male who presents with malfunctioning left radiocephalic arteriovenous fistula.  The patient is scheduled for left arm fistulogram.  The patient is aware the risks include but are not limited to: bleeding, infection, thrombosis of the cannulated access, and possible anaphylactic reaction to the contrast.  The patient is aware of the risks of the procedure and elects to proceed forward.  DESCRIPTION: After full informed written consent was obtained, the patient was brought back to the angiography suite and placed supine upon the angiography table.  The patient was connected to monitoring equipment.  The left forearm was prepped and draped in the standard fashion for a percutaneous access intervention.  Under ultrasound guidance, the left radiocephalic arteriovenous fistula was cannulated with a micropuncture needle.  The microwire was advanced into the fistula and the needle was exchanged for the a microsheath, which was lodged 2 cm into the access.  The wire was removed and the sheath was connected to the IV extension tubing.  Hand injections were completed to image the access from the antecubitum up to the level of axilla.  The central venous structures were also imaged by hand injections.  Based on the images, this patient will need: ligation of left radiocephalic fistula and placement of basilic vein transposition. Based on the completion  imaging, no intervention is necessary.  The wire and balloon were removed from the sheath.  A 4-0 Monocryl purse-string suture was sewn around the sheath.  The sheath was removed while tying down the suture.  A sterile bandage was applied to the puncture site.  COMPLICATIONS: none  CONDITION: stable  Adele Barthel, MD [facility] [place] Office: [phone] Pager: [phone]  06/03/2012 1:55 PM

## 2012-06-04 ENCOUNTER — Encounter (HOSPITAL_COMMUNITY): Payer: Self-pay | Admitting: *Deleted

## 2012-06-04 MED ORDER — DEXTROSE 5 % IV SOLN
1.5000 g | INTRAVENOUS | Status: AC
  Administered 2012-06-05: 1.5 g via INTRAVENOUS
  Filled 2012-06-04: qty 1.5

## 2012-06-04 NOTE — Progress Notes (Signed)
Pt states that he no longer is on BP meds, only home meds are Calcium with meals and Kidney Vitamin.

## 2012-06-05 ENCOUNTER — Encounter (HOSPITAL_COMMUNITY)
Admission: RE | Disposition: A | Discharge status: Home / Self Care | Payer: Self-pay | Source: Ambulatory Visit | Attending: Vascular Surgery

## 2012-06-05 ENCOUNTER — Ambulatory Visit (HOSPITAL_COMMUNITY): Payer: Medicare Other | Admitting: Anesthesiology

## 2012-06-05 ENCOUNTER — Ambulatory Visit (HOSPITAL_COMMUNITY)
Admission: RE | Admit: 2012-06-05 | Discharge: 2012-06-05 | Disposition: A | Discharge status: Home / Self Care | Payer: Medicare Other | Source: Ambulatory Visit | Attending: Vascular Surgery | Admitting: Vascular Surgery

## 2012-06-05 ENCOUNTER — Encounter (HOSPITAL_COMMUNITY): Payer: Self-pay | Admitting: Anesthesiology

## 2012-06-05 ENCOUNTER — Encounter (HOSPITAL_COMMUNITY): Payer: Self-pay | Admitting: *Deleted

## 2012-06-05 ENCOUNTER — Telehealth: Payer: Self-pay | Admitting: Vascular Surgery

## 2012-06-05 DIAGNOSIS — Z992 Dependence on renal dialysis: Secondary | ICD-10-CM | POA: Insufficient documentation

## 2012-06-05 DIAGNOSIS — I120 Hypertensive chronic kidney disease with stage 5 chronic kidney disease or end stage renal disease: Secondary | ICD-10-CM | POA: Diagnosis not present

## 2012-06-05 DIAGNOSIS — N186 End stage renal disease: Secondary | ICD-10-CM | POA: Insufficient documentation

## 2012-06-05 DIAGNOSIS — F17200 Nicotine dependence, unspecified, uncomplicated: Secondary | ICD-10-CM | POA: Insufficient documentation

## 2012-06-05 DIAGNOSIS — T82898A Other specified complication of vascular prosthetic devices, implants and grafts, initial encounter: Secondary | ICD-10-CM | POA: Diagnosis not present

## 2012-06-05 LAB — POCT I-STAT, CHEM 8
BUN: 53 mg/dL — ABNORMAL HIGH (ref 6–23)
Calcium, Ion: 1.31 mmol/L — ABNORMAL HIGH (ref 1.12–1.23)
Chloride: 102 meq/L (ref 96–112)
Creatinine, Ser: 8.4 mg/dL — ABNORMAL HIGH (ref 0.50–1.35)
Glucose, Bld: 103 mg/dL — ABNORMAL HIGH (ref 70–99)
HCT: 35 % — ABNORMAL LOW (ref 39.0–52.0)
Hemoglobin: 11.9 g/dL — ABNORMAL LOW (ref 13.0–17.0)
Potassium: 4.1 meq/L (ref 3.5–5.1)
Sodium: 141 meq/L (ref 135–145)
TCO2: 27 mmol/L (ref 0–100)

## 2012-06-05 LAB — SURGICAL PCR SCREEN
MRSA, PCR: NEGATIVE
Staphylococcus aureus: NEGATIVE

## 2012-06-05 SURGERY — TRANSPOSITION, VEIN, BASILIC
Anesthesia: General | Site: Arm Upper | Laterality: Left | Wound class: Clean

## 2012-06-05 MED ORDER — FENTANYL CITRATE 0.05 MG/ML IJ SOLN
INTRAMUSCULAR | Status: DC | PRN
  Administered 2012-06-05 (×8): 25 ug via INTRAVENOUS
  Administered 2012-06-05: 100 ug via INTRAVENOUS

## 2012-06-05 MED ORDER — ONDANSETRON HCL 4 MG/2ML IJ SOLN: 4.0000 mg | Freq: Once | INTRAMUSCULAR | Status: DC | PRN

## 2012-06-05 MED ORDER — HYDROMORPHONE HCL PF 1 MG/ML IJ SOLN
INTRAMUSCULAR | Status: AC
  Filled 2012-06-05: qty 1

## 2012-06-05 MED ORDER — ONDANSETRON HCL 4 MG/2ML IJ SOLN
INTRAMUSCULAR | Status: DC | PRN
  Administered 2012-06-05: 4 mg via INTRAVENOUS

## 2012-06-05 MED ORDER — HEPARIN SODIUM (PORCINE) 1000 UNIT/ML IJ SOLN
INTRAMUSCULAR | Status: DC | PRN
  Administered 2012-06-05: 5000 [IU] via INTRAVENOUS

## 2012-06-05 MED ORDER — PROTAMINE SULFATE 10 MG/ML IV SOLN
INTRAVENOUS | Status: DC | PRN
  Administered 2012-06-05: 20 mg via INTRAVENOUS
  Administered 2012-06-05: 10 mg via INTRAVENOUS

## 2012-06-05 MED ORDER — LIDOCAINE HCL (PF) 1 % IJ SOLN
INTRAMUSCULAR | Status: AC
  Filled 2012-06-05: qty 30

## 2012-06-05 MED ORDER — LIDOCAINE HCL (CARDIAC) 20 MG/ML IV SOLN
INTRAVENOUS | Status: DC | PRN
  Administered 2012-06-05: 100 mg via INTRAVENOUS

## 2012-06-05 MED ORDER — SODIUM CHLORIDE 0.9 % IV SOLN
INTRAVENOUS | Status: DC | PRN
  Administered 2012-06-05 (×2): via INTRAVENOUS

## 2012-06-05 MED ORDER — SODIUM CHLORIDE 0.9 % IR SOLN
Status: DC | PRN
  Administered 2012-06-05: 09:00:00

## 2012-06-05 MED ORDER — OXYCODONE HCL 5 MG PO TABS: 5.0000 mg | ORAL_TABLET | ORAL | Status: DC | PRN

## 2012-06-05 MED ORDER — MUPIROCIN 2 % EX OINT
TOPICAL_OINTMENT | Freq: Two times a day (BID) | CUTANEOUS | Status: DC
  Administered 2012-06-05: 1 via NASAL
  Filled 2012-06-05 (×2): qty 22

## 2012-06-05 MED ORDER — 0.9 % SODIUM CHLORIDE (POUR BTL) OPTIME
TOPICAL | Status: DC | PRN
  Administered 2012-06-05: 1000 mL

## 2012-06-05 MED ORDER — EPHEDRINE SULFATE 50 MG/ML IJ SOLN
INTRAMUSCULAR | Status: DC | PRN
  Administered 2012-06-05 (×2): 10 mg via INTRAVENOUS

## 2012-06-05 MED ORDER — PROPOFOL 10 MG/ML IV BOLUS
INTRAVENOUS | Status: DC | PRN
  Administered 2012-06-05: 130 mg via INTRAVENOUS

## 2012-06-05 MED ORDER — HYDROMORPHONE HCL PF 1 MG/ML IJ SOLN
0.2500 mg | INTRAMUSCULAR | Status: DC | PRN
  Administered 2012-06-05 (×2): 0.25 mg via INTRAVENOUS
  Administered 2012-06-05: 0.5 mg via INTRAVENOUS

## 2012-06-05 MED ORDER — THROMBIN 20000 UNITS EX SOLR
CUTANEOUS | Status: AC
  Filled 2012-06-05: qty 20000

## 2012-06-05 MED ORDER — SODIUM CHLORIDE 0.9 % IV SOLN: INTRAVENOUS | Status: DC

## 2012-06-05 SURGICAL SUPPLY — 46 items
ADH SKN CLS APL DERMABOND .7 (GAUZE/BANDAGES/DRESSINGS) ×3
CANISTER SUCTION 2500CC (MISCELLANEOUS) ×3 IMPLANT
CLIP TI MEDIUM 24 (CLIP) ×3 IMPLANT
CLIP TI WIDE RED SMALL 24 (CLIP) ×3 IMPLANT
CLOTH BEACON ORANGE TIMEOUT ST (SAFETY) ×3 IMPLANT
COVER PROBE W GEL 5X96 (DRAPES) ×3 IMPLANT
COVER SURGICAL LIGHT HANDLE (MISCELLANEOUS) ×3 IMPLANT
DECANTER SPIKE VIAL GLASS SM (MISCELLANEOUS) ×3 IMPLANT
DERMABOND ADVANCED (GAUZE/BANDAGES/DRESSINGS) ×3
DERMABOND ADVANCED .7 DNX12 (GAUZE/BANDAGES/DRESSINGS) ×6 IMPLANT
DRAIN PENROSE 1/4X12 LTX STRL (WOUND CARE) ×3 IMPLANT
ELECT REM PT RETURN 9FT ADLT (ELECTROSURGICAL) ×3
ELECTRODE REM PT RTRN 9FT ADLT (ELECTROSURGICAL) ×2 IMPLANT
GAUZE SPONGE 2X2 8PLY STRL LF (GAUZE/BANDAGES/DRESSINGS) IMPLANT
GEL ULTRASOUND 20GR AQUASONIC (MISCELLANEOUS) ×3 IMPLANT
GLOVE BIO SURGEON STRL SZ 6.5 (GLOVE) ×3 IMPLANT
GLOVE BIO SURGEON STRL SZ7.5 (GLOVE) ×3 IMPLANT
GLOVE BIOGEL PI IND STRL 7.0 (GLOVE) ×2 IMPLANT
GLOVE BIOGEL PI IND STRL 8 (GLOVE) ×2 IMPLANT
GLOVE BIOGEL PI INDICATOR 7.0 (GLOVE) ×1
GLOVE BIOGEL PI INDICATOR 8 (GLOVE) ×1
GLOVE ECLIPSE 6.5 STRL STRAW (GLOVE) ×3 IMPLANT
GOWN PREVENTION PLUS XLARGE (GOWN DISPOSABLE) ×3 IMPLANT
GOWN STRL NON-REIN LRG LVL3 (GOWN DISPOSABLE) ×6 IMPLANT
KIT BASIN OR (CUSTOM PROCEDURE TRAY) ×3 IMPLANT
KIT ROOM TURNOVER OR (KITS) ×3 IMPLANT
LOOP VESSEL MINI RED (MISCELLANEOUS) IMPLANT
NS IRRIG 1000ML POUR BTL (IV SOLUTION) ×3 IMPLANT
PACK CV ACCESS (CUSTOM PROCEDURE TRAY) ×3 IMPLANT
PAD ARMBOARD 7.5X6 YLW CONV (MISCELLANEOUS) ×6 IMPLANT
SPONGE GAUZE 2X2 STER 10/PKG (GAUZE/BANDAGES/DRESSINGS)
SPONGE SURGIFOAM ABS GEL 100 (HEMOSTASIS) IMPLANT
SUT PROLENE 6 0 CC (SUTURE) ×3 IMPLANT
SUT PROLENE 7 0 BV 1 (SUTURE) ×3 IMPLANT
SUT SILK 0 (SUTURE) IMPLANT
SUT SILK 2 0 FS (SUTURE) ×3 IMPLANT
SUT SILK 3 0 (SUTURE) ×2
SUT SILK 3-0 18XBRD TIE 12 (SUTURE) ×2 IMPLANT
SUT VIC AB 3-0 SH 27 (SUTURE) ×6
SUT VIC AB 3-0 SH 27X BRD (SUTURE) ×6 IMPLANT
SUT VICRYL 4-0 PS2 18IN ABS (SUTURE) ×15 IMPLANT
TAPE UMBILICAL COTTON 1/8X30 (MISCELLANEOUS) ×3 IMPLANT
TOWEL OR 17X24 6PK STRL BLUE (TOWEL DISPOSABLE) ×3 IMPLANT
TOWEL OR 17X26 10 PK STRL BLUE (TOWEL DISPOSABLE) ×3 IMPLANT
UNDERPAD 30X30 INCONTINENT (UNDERPADS AND DIAPERS) ×3 IMPLANT
WATER STERILE IRR 1000ML POUR (IV SOLUTION) ×3 IMPLANT

## 2012-06-05 NOTE — Telephone Encounter (Signed)
Message copied by Berniece Salines on Wed Jun 05, 2012  3:31 PM ------      Message from: [place]: Wed Jun 05, 2012  1:20 PM       Left basilic vein trans            Ligate left radial cephalic            Nurse asst            Needs follow up 4-6 weeks            Marco Arnold

## 2012-06-05 NOTE — H&P (View-Only) (Signed)
[facility] [place]  Brief History and Physical  History of Present Illness  Marco Arnold is a 46 y.o. male who presents with chief complaint: occluded outflow for L RC AVF.  The patient presents today for L arm fistulogram, possible intervention.    Past Medical History  Diagnosis Date  . Hypertension     dr  Tobie Lords      @ [facility]  med  . Chronic kidney disease     not started dialysis  yet    Past Surgical History  Procedure Date  . Av fistula placement 11/10/2011    Procedure: ARTERIOVENOUS (AV) FISTULA CREATION;  Surgeon: Mal Misty, MD;  Location: [place];  Service: Vascular;  Laterality: Left;  Ultrasound guided  . Insertion of dialysis catheter 02/09/2012    Procedure: INSERTION OF DIALYSIS CATHETER;  Surgeon: Mal Misty, MD;  Location: [place];  Service: Vascular;  Laterality: N/A;    History   Social History  . Marital Status: Divorced    Spouse Name: N/A    Number of Children: N/A  . Years of Education: N/A   Occupational History  . Not on file.   Social History Main Topics  . Smoking status: Current Every Day Smoker -- 25 years    Types: Cigarettes  . Smokeless tobacco: Never Used  . Alcohol Use: Yes     occ  . Drug Use: No  . Sexually Active: Not on file   Other Topics Concern  . Not on file   Social History Narrative  . No narrative on file    Family History  Problem Relation Age of Onset  . Cancer Mother   . Diabetes Father   . Hypertension Father     No current facility-administered medications on file prior to encounter.   Current Outpatient Prescriptions on File Prior to Encounter  Medication Sig Dispense Refill  . Multiple Vitamins-Minerals (MULTIVITAMINS THER. W/MINERALS) TABS Take 1 tablet by mouth daily.      Marland Kitchen amLODipine (NORVASC) 10 MG tablet Take 1 tablet (10 mg total) by mouth daily.  30 tablet  0  . calcitRIOL (ROCALTROL) 0.25 MCG capsule Take 1 capsule (0.25 mcg total) by mouth daily.  30 capsule  0    . calcium carbonate (OS-CAL - DOSED IN MG OF ELEMENTAL CALCIUM) 1250 MG tablet Take 2 tablets by mouth 2 (two) times daily.      . carvedilol (COREG) 25 MG tablet Take 25 mg by mouth 2 (two) times daily with a meal.      . furosemide (LASIX) 40 MG tablet Take 1 tablet (40 mg total) by mouth 2 (two) times daily.  30 tablet  0  . hydrALAZINE (APRESOLINE) 50 MG tablet Take 1 tablet (50 mg total) by mouth every 8 (eight) hours.  60 tablet  1  . pantoprazole (PROTONIX) 40 MG tablet Take 1 tablet (40 mg total) by mouth daily at 12 noon.  30 tablet  0  . sodium bicarbonate 650 MG tablet Take 2 tablets (1,300 mg total) by mouth 2 (two) times daily.  30 tablet  0    No Known Allergies  Review of Systems: As listed above, otherwise negative.  Physical Examination  Filed Vitals:   06/03/12 1035  BP: 141/87  Pulse: 71  Temp: 97.7 F (36.5 C)  TempSrc: Oral  Resp: 18  Height: 6' 3.5" (1.918 m)  Weight: 239 lb (108.41 kg)  SpO2: 97%    General: A&O x  3, WDWN  Pulmonary: Sym exp, good air movt, CTAB, no rales, rhonchi, & wheezing  Cardiac: RRR, Nl S1, S2, no Murmurs, rubs or gallops  Gastrointestinal: soft, NTND, -G/R, - HSM, - masses, - CVAT B  Musculoskeletal: M/S 5/5 throughout , Extremities without ischemic changes , palpable thrill and bruit in L arm  Laboratory See Luna is a 46 y.o. male who presents with: occluded venous outflow in L RC AVF.   The patient is scheduled for: L arm fistulogram, possible intervention I discussed with the patient the nature of angiographic procedures, especially the limited patencies of any endovascular intervention.  The patient is aware of that the risks of an angiographic procedure include but are not limited to: bleeding, infection, access site complications, renal failure, embolization, rupture of vessel, dissection, possible need for emergent surgical intervention, possible need for surgical procedures  to treat the patient's pathology, and stroke and death.    The patient is aware of the risks and agrees to proceed.  Adele Barthel, MD [facility] [place] Office: [phone] Pager: [phone]  06/03/2012, 7:33 AM

## 2012-06-05 NOTE — Anesthesia Procedure Notes (Signed)
Procedure Name: LMA Insertion Date/Time: 06/05/2012 8:35 AM Performed by: Julian Reil Pre-anesthesia Checklist: Patient identified, Emergency Drugs available, Suction available and Patient being monitored Patient Re-evaluated:Patient Re-evaluated prior to inductionOxygen Delivery Method: Circle system utilized Preoxygenation: Pre-oxygenation with 100% oxygen Intubation Type: IV induction LMA: LMA inserted LMA Size: 4.0 Tube type: Oral Number of attempts: 1 Placement Confirmation: positive ETCO2 and breath sounds checked- equal and bilateral Tube secured with: Tape Dental Injury: Teeth and Oropharynx as per pre-operative assessment

## 2012-06-05 NOTE — Preoperative (Signed)
Beta Blockers   Reason not to administer Beta Blockers:Not Applicable 

## 2012-06-05 NOTE — Transfer of Care (Signed)
Immediate Anesthesia Transfer of Care Note  Patient: Marco Arnold  Procedure(s) Performed: Procedure(s) (LRB) with comments: [place] (Left) LIGATION OF ARTERIOVENOUS  FISTULA (Left) - RADIAL-CEPHALIC  Patient Location: PACU  Anesthesia Type: General  Level of Consciousness: awake, alert , oriented and patient cooperative  Airway & Oxygen Therapy: Patient Spontanous Breathing and Patient connected to nasal cannula oxygen  Post-op Assessment: Report given to PACU RN, Post -op Vital signs reviewed and stable and Patient moving all extremities  Post vital signs: Reviewed and stable  Complications: No apparent anesthesia complications

## 2012-06-05 NOTE — Interval H&P Note (Signed)
History and Physical Interval Note:  06/05/2012 8:18 AM  Dedra Skeens  has presented today for surgery, with the diagnosis of ESRD,COMPLICATION WITH AVF  The various methods of treatment have been discussed with the patient and family. After consideration of risks, benefits and other options for treatment, the patient has consented to  Procedure(s) (LRB) with comments: [place] (Left) LIGATION OF ARTERIOVENOUS  FISTULA (Left) - RADIAL-CEPHALIC as a surgical intervention .  The patient's history has been reviewed, patient examined, no change in status, stable for surgery.  I have reviewed the patient's chart and labs.  Questions were answered to the patient's satisfaction.     Treson Laura E  Fistulogram shows cephalic vein occlusion with patent baslic vein On exam today fistula in forearm is pulsatile  Plan for 1 or 2 stage basilic vein fistula today  Procedure details risk benefit discussed with pt  Ruta Hinds, MD [facility] [place]: [phone] Pager: [phone]

## 2012-06-05 NOTE — Anesthesia Preprocedure Evaluation (Signed)
Anesthesia Evaluation  Patient identified by MRN, date of birth, ID band Patient awake    Reviewed: Allergy & Precautions, H&P , NPO status , Patient's Chart, lab work & pertinent test results  Airway Mallampati: I TM Distance: >3 FB Neck ROM: full    Dental   Pulmonary Current Smoker,          Cardiovascular hypertension, Rhythm:regular Rate:Normal     Neuro/Psych    GI/Hepatic   Endo/Other    Renal/GU ESRF, CRF and DialysisRenal disease     Musculoskeletal   Abdominal   Peds  Hematology   Anesthesia Other Findings   Reproductive/Obstetrics                           Anesthesia Physical Anesthesia Plan  ASA: III  Anesthesia Plan: General   Post-op Pain Management:    Induction: Intravenous  Airway Management Planned: Oral ETT and LMA  Additional Equipment:   Intra-op Plan:   Post-operative Plan: Extubation in OR  Informed Consent: I have reviewed the patients History and Physical, chart, labs and discussed the procedure including the risks, benefits and alternatives for the proposed anesthesia with the patient or authorized representative who has indicated his/her understanding and acceptance.     Plan Discussed with: CRNA, Anesthesiologist and Surgeon  Anesthesia Plan Comments:         Anesthesia Quick Evaluation

## 2012-06-05 NOTE — Anesthesia Postprocedure Evaluation (Signed)
  Anesthesia Post-op Note  Patient: Marco Arnold  Procedure(s) Performed: Procedure(s) (LRB) with comments: [place] (Left) LIGATION OF ARTERIOVENOUS  FISTULA (Left) - RADIAL-CEPHALIC  Patient Location: PACU  Anesthesia Type: General  Level of Consciousness: awake, oriented, sedated and patient cooperative  Airway and Oxygen Therapy: Patient Spontanous Breathing and Patient connected to nasal cannula oxygen  Post-op Pain: mild  Post-op Assessment: Post-op Vital signs reviewed, Patient's Cardiovascular Status Stable, Respiratory Function Stable, Patent Airway, No signs of Nausea or vomiting and Pain level controlled  Post-op Vital Signs: stable  Complications: No apparent anesthesia complications

## 2012-06-05 NOTE — Op Note (Signed)
Procedure: Left basilic vein transposition fistula  Preoperative diagnosis: End-stage renal disease  Postoperative diagnosis: Same  Anesthesia: Gen.  Assistant: Minus Breeding, RNFA  Operative findings: 3-5 mm left basilic vein, 3 mm left brachial artery  Operative details: After obtaining informed consent, the patient was taken to the operating room. The patient was placed in supine position operating table. After induction of general anesthesia, the patient's entire left upper extremity was prepped and draped in the usual sterile fashion. Next ultrasound was used to identify the left basilic vein. A longitudinal incision was made just above the antecubital crease in order to expose the basilic vein. The vein was of good quality approximately 3-5 mm in diameter throughout its course. Several longitudinal skip incisions were made up the arm from just below the antecubital crease all the way up to the axilla to harvest the basilic vein. Care was taken to try to not injure any sensory nerves and all the motor nerves were identified and protected. The vein was dissected free circumferentially and small side branches ligated and divided between silk ties or clips. Next the brachial artery was exposed by deepening the basilic vein harvest incision just above the antecubital crease. The artery was approximately 4 mm in diameter. This was dissected free circumferentially. Vessel loops were placed around it. There was some spasm within the artery after dissecting it free. Next the distal basilic vein was ligated with a 2 silk tie and the vein transected. The vein was brought out throughout the skip incisions and gently distended with heparinized saline and marked for orientation. The vein was then tunneled subcutaneously in an arcing configuration out over the biceps muscle down to the level of the exposed brachial artery just above the antecubital crease. The patient was given 5000 units of intravenous heparin.  Vessel loops were used to control the artery proximally and distally. The vein was cut to length and sewn end of vein to side of artery using a running 7-0 Prolene suture. Just prior completion of the anastomosis, it was forebled backbled and thoroughly flushed. The anastomosis was secured Vesseloops were released.   There was a palpable thrill immediately. The distal vein was also noted to fill fully. At this point all subcutaneous tissues were reapproximated using running 3-0 Vicryl suture. All skin incisions were closed with running 4  0 Vicryl subcuticular stitch. Dermabond was applied to all incisions. The patient tolerated the procedure well and there were no complications. Instrument sponge needle counts were correct at the end of the case. The patient had a palpable radial pulse at the end of the case.  The patient was taken to the recovery room in stable condition.  Ruta Hinds, MD [facility] [place] Office: [phone] Pager: [phone]

## 2012-06-27 DIAGNOSIS — N186 End stage renal disease: Secondary | ICD-10-CM | POA: Diagnosis not present

## 2012-06-29 DIAGNOSIS — D631 Anemia in chronic kidney disease: Secondary | ICD-10-CM | POA: Diagnosis not present

## 2012-06-29 DIAGNOSIS — N186 End stage renal disease: Secondary | ICD-10-CM | POA: Diagnosis not present

## 2012-06-29 DIAGNOSIS — N2581 Secondary hyperparathyroidism of renal origin: Secondary | ICD-10-CM | POA: Diagnosis not present

## 2012-07-03 ENCOUNTER — Encounter: Payer: Self-pay | Admitting: Vascular Surgery

## 2012-07-03 DIAGNOSIS — Z23 Encounter for immunization: Secondary | ICD-10-CM | POA: Diagnosis not present

## 2012-07-04 ENCOUNTER — Ambulatory Visit: Payer: Medicare Other | Admitting: Vascular Surgery

## 2012-07-04 ENCOUNTER — Encounter: Payer: Self-pay | Admitting: Vascular Surgery

## 2012-07-04 ENCOUNTER — Ambulatory Visit (INDEPENDENT_AMBULATORY_CARE_PROVIDER_SITE_OTHER): Payer: Medicare Other | Admitting: Vascular Surgery

## 2012-07-04 VITALS — BP 156/85 | HR 75 | Ht 75.0 in | Wt 248.0 lb

## 2012-07-04 DIAGNOSIS — N186 End stage renal disease: Secondary | ICD-10-CM

## 2012-07-04 NOTE — Progress Notes (Signed)
[facility] [place] HISTORY AND PHYSICAL    History of Present Illness:  Patient is a 46 y.o. year old male who presents for post-operative follow-up after placement of a left basilic vein transposition fistula. This was done on 06/05/2012. He denies any steal symptoms in his left hand. He is currently using a right-sided catheter. He dialyzes on Tuesday Thursday and Saturday.   Physical Examination  Filed Vitals:   07/04/12 0836  BP: 156/85  Pulse: 75    Body mass index is 31.00 kg/(m^2).  Skin: No rash, healing incisions left arm Extremities:  Palpable thrill audible bruit left upper arm basilic vein transposition fistula Neurologic: Upper and lower extremity motor 5/5 and symmetric  ASSESSMENT: Maturing left basilic vein transposition fistula   PLAN:  Followup as needed access should be ready to use and a few weeks   Ruta Hinds, MD [facility] [place] Office: [phone] Pager: [phone]

## 2012-07-27 DIAGNOSIS — N186 End stage renal disease: Secondary | ICD-10-CM | POA: Diagnosis not present

## 2012-07-30 DIAGNOSIS — N2581 Secondary hyperparathyroidism of renal origin: Secondary | ICD-10-CM | POA: Diagnosis not present

## 2012-07-30 DIAGNOSIS — N186 End stage renal disease: Secondary | ICD-10-CM | POA: Diagnosis not present

## 2012-07-30 DIAGNOSIS — D631 Anemia in chronic kidney disease: Secondary | ICD-10-CM | POA: Diagnosis not present

## 2012-07-30 DIAGNOSIS — D509 Iron deficiency anemia, unspecified: Secondary | ICD-10-CM | POA: Diagnosis not present

## 2012-08-27 DIAGNOSIS — N186 End stage renal disease: Secondary | ICD-10-CM | POA: Diagnosis not present

## 2012-08-29 DIAGNOSIS — D631 Anemia in chronic kidney disease: Secondary | ICD-10-CM | POA: Diagnosis not present

## 2012-08-29 DIAGNOSIS — D509 Iron deficiency anemia, unspecified: Secondary | ICD-10-CM | POA: Diagnosis not present

## 2012-08-29 DIAGNOSIS — N186 End stage renal disease: Secondary | ICD-10-CM | POA: Diagnosis not present

## 2012-09-05 ENCOUNTER — Other Ambulatory Visit: Payer: Self-pay | Admitting: *Deleted

## 2012-09-11 ENCOUNTER — Ambulatory Visit (HOSPITAL_COMMUNITY)
Admission: RE | Admit: 2012-09-11 | Discharge: 2012-09-11 | Disposition: A | Discharge status: Home / Self Care | Payer: Medicare Other | Source: Ambulatory Visit | Attending: Vascular Surgery | Admitting: Vascular Surgery

## 2012-09-11 DIAGNOSIS — Z452 Encounter for adjustment and management of vascular access device: Secondary | ICD-10-CM | POA: Insufficient documentation

## 2012-09-11 DIAGNOSIS — I120 Hypertensive chronic kidney disease with stage 5 chronic kidney disease or end stage renal disease: Secondary | ICD-10-CM | POA: Insufficient documentation

## 2012-09-11 DIAGNOSIS — N186 End stage renal disease: Secondary | ICD-10-CM | POA: Insufficient documentation

## 2012-09-11 NOTE — H&P (Signed)
[facility] SHORT STAY H&P  CC: ESRD   HPI: Marco Arnold is a 47 y.o. male who has been on HD through  functioning Hemodialysis access in the left upper extremity. They are here for HD catheter removal. Pt. denies signs of steal syndrome.  Past Medical History  Diagnosis Date  . Hypertension     dr  Tobie Lords      @ [facility]  med  . Chronic kidney disease     not started dialysis  yet    FH:  Non-Contributory  Social HX History  Substance Use Topics  . Smoking status: Current Every Day Smoker -- 1.0 packs/day for 25 years    Types: Cigarettes  . Smokeless tobacco: Never Used  . Alcohol Use: No     Comment: occ    Allergies No Known Allergies  Medications Current Outpatient Prescriptions  Medication Sig Dispense Refill  . calcium carbonate (TUMS - DOSED IN MG ELEMENTAL CALCIUM) 500 MG chewable tablet Chew 1-2 tablets by mouth as needed. For indigestion      . Multiple Vitamins-Minerals (MULTIVITAMINS THER. W/MINERALS) TABS Take 1 tablet by mouth daily.      Marland Kitchen oxyCODONE (ROXICODONE) 5 MG immediate release tablet Take 1 tablet (5 mg total) by mouth every 4 (four) hours as needed for pain.  30 tablet  0    Labs COAG Lab Results  Component Value Date   INR 1.11 02/09/2012   INR 1.16 11/06/2011   No results found for this basename: PTT    PHYSICAL EXAM  Filed Vitals:   09/11/12 1223  BP: 147/76  Pulse: 85  Temp: 98.9 F (37.2 C)  Resp: 18    General:  WDWN in NAD HENT: WNL Eyes: Pupils equal Pulmonary: normal non-labored breathing  Cardiac: RRR, Skin: normal, no cyanosis, jaundice, pallor or bruising Vascular Exam: Extremities without ischemic changes, no Gangrene , no cellulitis; no open wounds;   There is a good thrill and good bruit in the LUA AVF. Hand grip is 5/5 and sensation in digits is intact;   Impression: This is a 47 y.o. male who has a functioning HD access.  Plan: Removal of Right IJ HD catheter Marco Arnold  J @TODAY @ 1:09 PM

## 2012-09-11 NOTE — Progress Notes (Addendum)
Scant amount bloody drg on edge of lower dressing. Instructed patient to walk around and will recheck. Increased drainage on dressing. Pressure held x 10 minutes. No further bleeding noted. Redressed with 4x4 and hypofix. Had patient walk around the room for a few minutes and rechecked. No bleeding noted on dressing. Gerri Lins, PA notified.

## 2012-09-11 NOTE — Progress Notes (Signed)
[facility] Catheter Removal Procedure Note  Diagnosis: ESRD with Functioning AVF/AVGG  Plan:  Remove right diatek catheter  Consent signed:  yes Time out completed:  yes Coumadin:  no PT/INR (if applicable):   Other labs:   Procedure: 1.  Sterile prepping and draping over catheter area 2. 4 ml 2% lidocaine plain instilled at removal site. 3.  right catheter removed in its entirety with cuff in tact. 4.  Complications: none  5. Tip of catheter sent for culture:  no   Patient tolerated procedure well:  yes Pressure held, no bleeding noted, dressing applied Instructions given to the pt regarding wound care and bleeding.  Other:  Arshi Duarte J 09/11/2012 1:13 PM

## 2012-09-11 NOTE — H&P (Signed)
Adele Barthel, MD [facility] [place] Office: [phone] Pager: [phone]  09/11/2012, 1:34 PM

## 2012-09-19 DIAGNOSIS — N186 End stage renal disease: Secondary | ICD-10-CM | POA: Diagnosis not present

## 2012-09-27 DIAGNOSIS — R5383 Other fatigue: Secondary | ICD-10-CM | POA: Diagnosis not present

## 2012-09-27 DIAGNOSIS — N186 End stage renal disease: Secondary | ICD-10-CM | POA: Diagnosis not present

## 2012-09-27 DIAGNOSIS — R0609 Other forms of dyspnea: Secondary | ICD-10-CM | POA: Diagnosis not present

## 2012-09-27 DIAGNOSIS — G2581 Restless legs syndrome: Secondary | ICD-10-CM | POA: Diagnosis not present

## 2012-09-27 DIAGNOSIS — R0989 Other specified symptoms and signs involving the circulatory and respiratory systems: Secondary | ICD-10-CM | POA: Diagnosis not present

## 2012-09-27 DIAGNOSIS — R5381 Other malaise: Secondary | ICD-10-CM | POA: Diagnosis not present

## 2012-09-28 DIAGNOSIS — D631 Anemia in chronic kidney disease: Secondary | ICD-10-CM | POA: Diagnosis not present

## 2012-09-28 DIAGNOSIS — E8779 Other fluid overload: Secondary | ICD-10-CM | POA: Diagnosis not present

## 2012-09-28 DIAGNOSIS — I10 Essential (primary) hypertension: Secondary | ICD-10-CM | POA: Diagnosis not present

## 2012-09-28 DIAGNOSIS — N186 End stage renal disease: Secondary | ICD-10-CM | POA: Diagnosis not present

## 2012-10-07 DIAGNOSIS — Z0000 Encounter for general adult medical examination without abnormal findings: Secondary | ICD-10-CM | POA: Diagnosis not present

## 2012-10-07 DIAGNOSIS — Z6830 Body mass index (BMI) 30.0-30.9, adult: Secondary | ICD-10-CM | POA: Diagnosis not present

## 2012-10-08 DIAGNOSIS — G4761 Periodic limb movement disorder: Secondary | ICD-10-CM | POA: Diagnosis not present

## 2012-10-08 DIAGNOSIS — R0609 Other forms of dyspnea: Secondary | ICD-10-CM | POA: Diagnosis not present

## 2012-10-08 DIAGNOSIS — G4736 Sleep related hypoventilation in conditions classified elsewhere: Secondary | ICD-10-CM | POA: Diagnosis not present

## 2012-10-08 DIAGNOSIS — R0989 Other specified symptoms and signs involving the circulatory and respiratory systems: Secondary | ICD-10-CM | POA: Diagnosis not present

## 2012-10-12 DIAGNOSIS — R5381 Other malaise: Secondary | ICD-10-CM | POA: Diagnosis not present

## 2012-10-25 DIAGNOSIS — N186 End stage renal disease: Secondary | ICD-10-CM | POA: Diagnosis not present

## 2012-10-28 DIAGNOSIS — E0590 Thyrotoxicosis, unspecified without thyrotoxic crisis or storm: Secondary | ICD-10-CM | POA: Diagnosis not present

## 2012-10-28 DIAGNOSIS — D631 Anemia in chronic kidney disease: Secondary | ICD-10-CM | POA: Diagnosis not present

## 2012-10-28 DIAGNOSIS — N186 End stage renal disease: Secondary | ICD-10-CM | POA: Diagnosis not present

## 2012-10-28 DIAGNOSIS — E291 Testicular hypofunction: Secondary | ICD-10-CM | POA: Diagnosis not present

## 2012-10-29 DIAGNOSIS — G479 Sleep disorder, unspecified: Secondary | ICD-10-CM | POA: Diagnosis not present

## 2012-10-29 DIAGNOSIS — R9431 Abnormal electrocardiogram [ECG] [EKG]: Secondary | ICD-10-CM | POA: Diagnosis not present

## 2012-10-29 DIAGNOSIS — G4736 Sleep related hypoventilation in conditions classified elsewhere: Secondary | ICD-10-CM | POA: Diagnosis not present

## 2012-10-29 DIAGNOSIS — I120 Hypertensive chronic kidney disease with stage 5 chronic kidney disease or end stage renal disease: Secondary | ICD-10-CM | POA: Diagnosis not present

## 2012-11-08 ENCOUNTER — Institutional Professional Consult (permissible substitution): Payer: Medicare Other | Admitting: Pulmonary Disease

## 2012-11-25 DIAGNOSIS — N186 End stage renal disease: Secondary | ICD-10-CM | POA: Diagnosis not present

## 2012-11-27 DIAGNOSIS — N186 End stage renal disease: Secondary | ICD-10-CM | POA: Diagnosis not present

## 2012-11-27 DIAGNOSIS — D631 Anemia in chronic kidney disease: Secondary | ICD-10-CM | POA: Diagnosis not present

## 2012-12-05 ENCOUNTER — Encounter: Payer: Self-pay | Admitting: Pulmonary Disease

## 2012-12-05 ENCOUNTER — Ambulatory Visit (INDEPENDENT_AMBULATORY_CARE_PROVIDER_SITE_OTHER): Payer: Medicare Other | Admitting: Pulmonary Disease

## 2012-12-05 VITALS — BP 148/84 | HR 79 | Temp 97.8°F | Ht 75.5 in | Wt 253.6 lb

## 2012-12-05 DIAGNOSIS — G478 Other sleep disorders: Secondary | ICD-10-CM

## 2012-12-05 NOTE — Patient Instructions (Addendum)
Stay on your requip for your restless legs, and followup with your neurologist for breakthru symptoms. You did have a drop in your blood oxygen level at night, but not for a prolonged period of time.  This will improve with weight loss and smoking cessation. Work on no napping during the day, and do not stay in bed more than 30-38min if you cannot initiate sleep. If chronic pain in your leg continues to be an issue, can discuss further management with your primary md.

## 2012-12-05 NOTE — Assessment & Plan Note (Signed)
The patient has a history of significant sleep disruption that I think is multifactorial.  He has the restless leg syndrome related to his end-stage renal disease, and is continuing to have breakthrough symptoms despite being on a dopamine agonist.  He is being followed by a neurologist for this, and I have asked him to followup to discuss further treatment options.  He did not have sleep apnea on his sleep study, and no evidence for obesity hypoventilation with a fairly normal end tidal CO2 during the study.  He did have mild to moderate oxygen desaturation during the night, probably related to his obesity and airway inflammation from ongoing smoking.  He is not overly excited about wearing oxygen nocturnally, and I really don't think it is absolutely medical necessary at this point.  I would consider treatment with nocturnal oxygen if he continues to smoke, and if he developed pulmonary symptoms that are significant.  Finally, he clearly has an element of sleep disruption related to chronic pain, as well as an element of insomnia.  I have reviewed good sleep hygiene with him, and asked him to try and avoid napping during his dialysis days.  I also reviewed stimulus control therapy with him as a part of cognitive behavioral therapy.

## 2012-12-05 NOTE — Progress Notes (Signed)
Subjective:    Patient ID: Marco Arnold, male    DOB: 1965/11/04, 47 y.o.   MRN: [id number]  HPI The patient is a 47 year old male who I've been asked to see for significant sleep disruption.  He has had a sleep study in February of this year which showed no sleep apnea, a normal end-tidal CO2, but did have mild to moderate oxygen desaturation for 59 minutes during the night.  He was also noted to have large numbers of periodic limb movements.  The patient is currently being followed by neurology for his movement disorder, and is taking a dopamine agonist but continues to have breakthrough symptoms.  The patient states it is sometimes difficult for him to get to sleep, and has frequent awakenings during the night.  Sometimes this is due to his breakthrough limb movements, but he also has chronic pain involving the left hip and upper leg.  He does not feel rested in the mornings upon arising, and will often take a 1-1/2 hour nap after his hemodialysis sessions 3 days a week.  The patient is a long-term smoker, but does not have frequent episodes of acute asthmatic bronchitis.   Sleep Questionnaire What time do you typically go to bed?( Between what hours) 11pm 11pm at 1127 on 12/05/12 by Danella Maiers, CMA How long does it take you to fall asleep? 1 hr 1 hr at 1127 on 12/05/12 by Ivy Lynn Mabe, CMA How many times during the night do you wake up? 4 4 at 1127 on 12/05/12 by Danella Maiers, CMA What time do you get out of bed to start your day? 0500 0500 at 1127 on 12/05/12 by Danella Maiers, CMA Do you drive or operate heavy machinery in your occupation? No No at 1127 on 12/05/12 by Danella Maiers, CMA How much has your weight changed (up or down) over the past two years? (In pounds) 40 lb (18.144 kg)40 lb (18.144 kg) 40lb increase at 1127 on 12/05/12 by Ivy Lynn Mabe, CMA Have you ever had a sleep study before? Yes Yes at 1127 on 12/05/12 by Danella Maiers, CMA If yes, location of study?  [facility] at 1127 on 12/05/12 by Danella Maiers, CMA If yes, date of study? 09/2012 09/2012 at 1127 on 12/05/12 by Danella Maiers, CMA Do you currently use CPAP? No No at 1127 on 12/05/12 by Ivy Lynn Mabe, CMA Do you wear oxygen at any time? No No at 1127 on 12/05/12 by Ivy Lynn Mabe, CMA   Review of Systems  Constitutional: Negative for fever and unexpected weight change.  HENT: Negative for ear pain, nosebleeds, congestion, sore throat, rhinorrhea, sneezing, trouble swallowing, dental problem, postnasal drip and sinus pressure.        Allergies  Eyes: Negative for redness and itching.  Respiratory: Positive for cough, shortness of breath and wheezing. Negative for chest tightness.   Cardiovascular: Negative for palpitations and leg swelling.  Gastrointestinal: Negative for nausea and vomiting.  Genitourinary: Negative for dysuria.  Musculoskeletal: Negative for joint swelling.  Skin: Negative for rash.  Neurological: Negative for headaches.  Hematological: Does not bruise/bleed easily.  Psychiatric/Behavioral: Positive for dysphoric mood. Sleep disturbance: restless legs. The patient is not nervous/anxious.        Objective:   Physical Exam Constitutional:  Obese male, no acute distress  HENT:  Nares patent without discharge  Oropharynx without exudate, palate and uvula are elongated.  Eyes:  Perrla, eomi, no scleral icterus  Neck:  No JVD, no TMG  Cardiovascular:  Normal rate, regular rhythm, no rubs or gallops.  No murmurs        Intact distal pulses  Pulmonary :  Normal breath sounds, no stridor or respiratory distress   No rales, rhonchi, or wheezing  Abdominal:  Soft, nondistended, bowel sounds present.  No tenderness noted.   Musculoskeletal:  No significant lower extremity edema noted.  Lymph Nodes:  No cervical lymphadenopathy noted  Skin:  No cyanosis noted  Neurologic:  Alert, appropriate, moves all 4 extremities without  obvious deficit.         Assessment & Plan:

## 2012-12-25 DIAGNOSIS — N186 End stage renal disease: Secondary | ICD-10-CM | POA: Diagnosis not present

## 2012-12-27 DIAGNOSIS — D509 Iron deficiency anemia, unspecified: Secondary | ICD-10-CM | POA: Diagnosis not present

## 2012-12-27 DIAGNOSIS — D631 Anemia in chronic kidney disease: Secondary | ICD-10-CM | POA: Diagnosis not present

## 2012-12-27 DIAGNOSIS — N186 End stage renal disease: Secondary | ICD-10-CM | POA: Diagnosis not present

## 2012-12-30 DIAGNOSIS — N189 Chronic kidney disease, unspecified: Secondary | ICD-10-CM | POA: Diagnosis not present

## 2012-12-30 DIAGNOSIS — N529 Male erectile dysfunction, unspecified: Secondary | ICD-10-CM | POA: Diagnosis not present

## 2012-12-31 ENCOUNTER — Encounter (HOSPITAL_COMMUNITY): Payer: Self-pay | Admitting: Pharmacy Technician

## 2012-12-31 ENCOUNTER — Other Ambulatory Visit: Payer: Self-pay | Admitting: *Deleted

## 2013-01-01 MED ORDER — SODIUM CHLORIDE 0.9 % IJ SOLN: 3.0000 mL | INTRAMUSCULAR | Status: DC | PRN

## 2013-01-02 ENCOUNTER — Encounter (HOSPITAL_COMMUNITY)
Admission: RE | Disposition: A | Discharge status: Home / Self Care | Payer: Self-pay | Source: Ambulatory Visit | Attending: Vascular Surgery

## 2013-01-02 ENCOUNTER — Ambulatory Visit (HOSPITAL_COMMUNITY)
Admission: RE | Admit: 2013-01-02 | Discharge: 2013-01-02 | Disposition: A | Discharge status: Home / Self Care | Payer: Medicare Other | Source: Ambulatory Visit | Attending: Vascular Surgery | Admitting: Vascular Surgery

## 2013-01-02 DIAGNOSIS — N186 End stage renal disease: Secondary | ICD-10-CM | POA: Insufficient documentation

## 2013-01-02 DIAGNOSIS — Z79899 Other long term (current) drug therapy: Secondary | ICD-10-CM | POA: Insufficient documentation

## 2013-01-02 DIAGNOSIS — F17200 Nicotine dependence, unspecified, uncomplicated: Secondary | ICD-10-CM | POA: Insufficient documentation

## 2013-01-02 DIAGNOSIS — I120 Hypertensive chronic kidney disease with stage 5 chronic kidney disease or end stage renal disease: Secondary | ICD-10-CM | POA: Insufficient documentation

## 2013-01-02 DIAGNOSIS — T82898A Other specified complication of vascular prosthetic devices, implants and grafts, initial encounter: Secondary | ICD-10-CM | POA: Diagnosis not present

## 2013-01-02 HISTORY — PX: SHUNTOGRAM: SHX5491

## 2013-01-02 LAB — POCT I-STAT, CHEM 8
BUN: 52 mg/dL — ABNORMAL HIGH (ref 6–23)
Calcium, Ion: 1.14 mmol/L (ref 1.12–1.23)
Chloride: 98 mEq/L (ref 96–112)
Creatinine, Ser: 11.2 mg/dL — ABNORMAL HIGH (ref 0.50–1.35)
Glucose, Bld: 112 mg/dL — ABNORMAL HIGH (ref 70–99)
HCT: 36 % — ABNORMAL LOW (ref 39.0–52.0)
Hemoglobin: 12.2 g/dL — ABNORMAL LOW (ref 13.0–17.0)
Potassium: 3.8 mEq/L (ref 3.5–5.1)
Sodium: 135 mEq/L (ref 135–145)
TCO2: 27 mmol/L (ref 0–100)

## 2013-01-02 SURGERY — ASSESSMENT, SHUNT FUNCTION, WITH CONTRAST RADIOGRAPHIC STUDY
Anesthesia: LOCAL | Laterality: Left

## 2013-01-02 MED ORDER — LIDOCAINE HCL (PF) 1 % IJ SOLN
INTRAMUSCULAR | Status: AC
  Filled 2013-01-02: qty 30

## 2013-01-02 MED ORDER — HEPARIN (PORCINE) IN NACL 2-0.9 UNIT/ML-% IJ SOLN
INTRAMUSCULAR | Status: AC
  Filled 2013-01-02: qty 500

## 2013-01-02 NOTE — Op Note (Signed)
OPERATIVE NOTE   PROCEDURE: 1. left basilic vein transposition cannulation under ultrasound guidance 2. left arm fistulogram  PRE-OPERATIVE DIAGNOSIS: Malfunctioning left basilic vein transposition   POST-OPERATIVE DIAGNOSIS: same as above   SURGEON: Adele Barthel, MD  ANESTHESIA: local  ESTIMATED BLOOD LOSS: 5 cc  FINDING(S): 1. Widely patent fistula throughout  2. Widely patent central venous system  SPECIMEN(S):  None  CONTRAST: 30 cc  INDICATIONS: Marco Arnold is a 47 y.o. male who  presents with malfunctioning left basilic vein transposition.  The patient is scheduled for left arm fistulogram.  The patient is aware the risks include but are not limited to: bleeding, infection, thrombosis of the cannulated access, and possible anaphylactic reaction to the contrast.  The patient is aware of the risks of the procedure and elects to proceed forward.  DESCRIPTION: After full informed written consent was obtained, the patient was brought back to the angiography suite and placed supine upon the angiography table.  The patient was connected to monitoring equipment.  The left arm was prepped and draped in the standard fashion for a left arm fistulogram.  Under ultrasound guidance, the left basilic vein transposition was cannulated with a micropuncture needle.  The microwire was advanced into the fistula and the needle was exchanged for the a microsheath, which was lodged 2 cm into the access.  The wire was removed and the sheath was connected to the IV extension tubing.  Hand injections were completed to image the access from the antecubitum up to the level of axilla.  The central venous structures were also imaged by hand injections.  Based on the images, this patient will need: nothing.  A 4-0 Monocryl purse-string suture was sewn around the sheath.  The sheath was removed while tying down the suture.  A sterile bandage was applied to the puncture site.  COMPLICATIONS: none  CONDITION:  stable  Adele Barthel, MD [facility] [place] Office: [phone] Pager: [phone]  01/02/2013 10:12 AM

## 2013-01-02 NOTE — H&P (Signed)
[facility] [place]  Brief History and Physical  History of Present Illness  Marco Arnold is a 47 y.o. male who presents with chief complaint: drop in flow rates in L BVT.  The patient presents today for L fistulogram, possible intervention.    Past Medical History  Diagnosis Date  . Hypertension     dr  Tobie Lords      @ [facility]  med  . Chronic kidney disease     not started dialysis  yet    Past Surgical History  Procedure Laterality Date  . Av fistula placement  11/10/2011    Procedure: ARTERIOVENOUS (AV) FISTULA CREATION;  Surgeon: Mal Misty, MD;  Location: [facility];  Service: Vascular;  Laterality: Left;  Ultrasound guided  . Insertion of dialysis catheter  02/09/2012    Procedure: INSERTION OF DIALYSIS CATHETER;  Surgeon: Mal Misty, MD;  Location: [facility];  Service: Vascular;  Laterality: N/A;    History   Social History  . Marital Status: Divorced    Spouse Name: N/A    Number of Children: N/A  . Years of Education: N/A   Occupational History  . disabled    Social History Main Topics  . Smoking status: Current Every Day Smoker -- 1.00 packs/day for 35 years    Types: Cigarettes  . Smokeless tobacco: Never Used     Comment: pt states that he is trying to quit--is trying to cut back at the current time  . Alcohol Use: Yes     Comment: occ  . Drug Use: No  . Sexually Active: Not on file   Other Topics Concern  . Not on file   Social History Narrative  . No narrative on file    Family History  Problem Relation Age of Onset  . Cancer Mother   . Diabetes Father   . Hypertension Father   . Cancer - Lung Paternal Grandfather     No current facility-administered medications on file prior to encounter.   Current Outpatient Prescriptions on File Prior to Encounter  Medication Sig Dispense Refill  . amLODipine (NORVASC) 5 MG tablet Take 5 mg by mouth daily.       . calcium carbonate (TUMS - DOSED IN MG ELEMENTAL CALCIUM) 500 MG  chewable tablet Chew 3 tablets by mouth 5 (five) times daily. 3 times a day With meals and with 2 snacks      . furosemide (LASIX) 80 MG tablet Take 1 tablet by mouth as needed. With increased fluid      . Multiple Vitamins-Minerals (MULTIVITAMINS THER. W/MINERALS) TABS Take 1 tablet by mouth daily.      . pantoprazole (PROTONIX) 40 MG tablet Take 40 mg by mouth as needed (for stomach).       Marland Kitchen rOPINIRole (REQUIP) 0.25 MG tablet Take 0.75 tablets by mouth at bedtime.         No Known Allergies  Review of Systems: As listed above, otherwise negative.  Physical Examination  Filed Vitals:   01/02/13 0713  BP: 126/84  Pulse: 80  Temp: 98.2 F (36.8 C)  TempSrc: Oral  Resp: 20  Height: 6' 3.5" (1.918 m)  Weight: 250 lb (113.399 kg)  SpO2: 98%    General: A&O x 3, WDWN  Pulmonary: Sym exp, good air movt, CTAB, no rales, rhonchi, & wheezing  Cardiac: RRR, Nl S1, S2, no Murmurs, rubs or gallops  Gastrointestinal: soft, NTND, -G/R, - HSM, - masses, - CVAT B  Musculoskeletal: M/S 5/5 throughout , Extremities without ischemic changes , palpable thrill and ausc. bruit in L arm  Laboratory See iStat  Medical Decision Making  Marco Arnold is a 47 y.o. male who presents with: L BVT malfunction.   The patient is scheduled for: L arm fistulogram, possible intervention I discussed with the patient the nature of angiographic procedures, especially the limited patencies of any endovascular intervention.  The patient is aware of that the risks of an angiographic procedure include but are not limited to: bleeding, infection, access site complications, renal failure, embolization, rupture of vessel, dissection, possible need for emergent surgical intervention, possible need for surgical procedures to treat the patient's pathology, and stroke and death.    The patient is aware of the risks and agrees to proceed.  Adele Barthel, MD [facility] [place] Office:  [phone] Pager: [phone]  01/02/2013, 8:26 AM

## 2013-01-25 DIAGNOSIS — N186 End stage renal disease: Secondary | ICD-10-CM | POA: Diagnosis not present

## 2013-01-27 DIAGNOSIS — D509 Iron deficiency anemia, unspecified: Secondary | ICD-10-CM | POA: Diagnosis not present

## 2013-01-27 DIAGNOSIS — N186 End stage renal disease: Secondary | ICD-10-CM | POA: Diagnosis not present

## 2013-01-30 ENCOUNTER — Ambulatory Visit: Payer: Self-pay | Admitting: Neurology

## 2013-02-24 DIAGNOSIS — N186 End stage renal disease: Secondary | ICD-10-CM | POA: Diagnosis not present

## 2013-02-26 DIAGNOSIS — N186 End stage renal disease: Secondary | ICD-10-CM | POA: Diagnosis not present

## 2013-02-26 DIAGNOSIS — D509 Iron deficiency anemia, unspecified: Secondary | ICD-10-CM | POA: Diagnosis not present

## 2013-02-26 DIAGNOSIS — D631 Anemia in chronic kidney disease: Secondary | ICD-10-CM | POA: Diagnosis not present

## 2013-03-18 DIAGNOSIS — N529 Male erectile dysfunction, unspecified: Secondary | ICD-10-CM | POA: Diagnosis not present

## 2013-03-18 DIAGNOSIS — N189 Chronic kidney disease, unspecified: Secondary | ICD-10-CM | POA: Diagnosis not present

## 2013-03-18 DIAGNOSIS — N4830 Priapism, unspecified: Secondary | ICD-10-CM | POA: Diagnosis not present

## 2013-03-27 DIAGNOSIS — N186 End stage renal disease: Secondary | ICD-10-CM | POA: Diagnosis not present

## 2013-03-28 DIAGNOSIS — N186 End stage renal disease: Secondary | ICD-10-CM | POA: Diagnosis not present

## 2013-03-28 DIAGNOSIS — D509 Iron deficiency anemia, unspecified: Secondary | ICD-10-CM | POA: Diagnosis not present

## 2013-03-28 DIAGNOSIS — D631 Anemia in chronic kidney disease: Secondary | ICD-10-CM | POA: Diagnosis not present

## 2013-04-27 DIAGNOSIS — N186 End stage renal disease: Secondary | ICD-10-CM | POA: Diagnosis not present

## 2013-04-28 DIAGNOSIS — N186 End stage renal disease: Secondary | ICD-10-CM | POA: Diagnosis not present

## 2013-04-28 DIAGNOSIS — D631 Anemia in chronic kidney disease: Secondary | ICD-10-CM | POA: Diagnosis not present

## 2013-05-27 DIAGNOSIS — N186 End stage renal disease: Secondary | ICD-10-CM | POA: Diagnosis not present

## 2013-05-28 DIAGNOSIS — D509 Iron deficiency anemia, unspecified: Secondary | ICD-10-CM | POA: Diagnosis not present

## 2013-05-28 DIAGNOSIS — D631 Anemia in chronic kidney disease: Secondary | ICD-10-CM | POA: Diagnosis not present

## 2013-05-28 DIAGNOSIS — Z23 Encounter for immunization: Secondary | ICD-10-CM | POA: Diagnosis not present

## 2013-05-28 DIAGNOSIS — N186 End stage renal disease: Secondary | ICD-10-CM | POA: Diagnosis not present

## 2013-06-27 DIAGNOSIS — N186 End stage renal disease: Secondary | ICD-10-CM | POA: Diagnosis not present

## 2013-06-30 DIAGNOSIS — N186 End stage renal disease: Secondary | ICD-10-CM | POA: Diagnosis not present

## 2013-06-30 DIAGNOSIS — D631 Anemia in chronic kidney disease: Secondary | ICD-10-CM | POA: Diagnosis not present

## 2013-07-27 DIAGNOSIS — N186 End stage renal disease: Secondary | ICD-10-CM | POA: Diagnosis not present

## 2013-07-28 DIAGNOSIS — D631 Anemia in chronic kidney disease: Secondary | ICD-10-CM | POA: Diagnosis not present

## 2013-07-28 DIAGNOSIS — D509 Iron deficiency anemia, unspecified: Secondary | ICD-10-CM | POA: Diagnosis not present

## 2013-07-28 DIAGNOSIS — N186 End stage renal disease: Secondary | ICD-10-CM | POA: Diagnosis not present

## 2013-08-27 DIAGNOSIS — N186 End stage renal disease: Secondary | ICD-10-CM | POA: Diagnosis not present

## 2013-08-29 DIAGNOSIS — N186 End stage renal disease: Secondary | ICD-10-CM | POA: Diagnosis not present

## 2013-08-29 DIAGNOSIS — N039 Chronic nephritic syndrome with unspecified morphologic changes: Secondary | ICD-10-CM | POA: Diagnosis not present

## 2013-08-29 DIAGNOSIS — D631 Anemia in chronic kidney disease: Secondary | ICD-10-CM | POA: Diagnosis not present

## 2013-08-29 DIAGNOSIS — D509 Iron deficiency anemia, unspecified: Secondary | ICD-10-CM | POA: Diagnosis not present

## 2013-09-24 DIAGNOSIS — N186 End stage renal disease: Secondary | ICD-10-CM | POA: Diagnosis not present

## 2013-09-27 DIAGNOSIS — N186 End stage renal disease: Secondary | ICD-10-CM | POA: Diagnosis not present

## 2013-09-29 DIAGNOSIS — N186 End stage renal disease: Secondary | ICD-10-CM | POA: Diagnosis not present

## 2013-09-29 DIAGNOSIS — D509 Iron deficiency anemia, unspecified: Secondary | ICD-10-CM | POA: Diagnosis not present

## 2013-09-29 DIAGNOSIS — D631 Anemia in chronic kidney disease: Secondary | ICD-10-CM | POA: Diagnosis not present

## 2013-10-25 DIAGNOSIS — N186 End stage renal disease: Secondary | ICD-10-CM | POA: Diagnosis not present

## 2013-10-27 DIAGNOSIS — D631 Anemia in chronic kidney disease: Secondary | ICD-10-CM | POA: Diagnosis not present

## 2013-10-27 DIAGNOSIS — N039 Chronic nephritic syndrome with unspecified morphologic changes: Secondary | ICD-10-CM | POA: Diagnosis not present

## 2013-10-27 DIAGNOSIS — D509 Iron deficiency anemia, unspecified: Secondary | ICD-10-CM | POA: Diagnosis not present

## 2013-10-27 DIAGNOSIS — N186 End stage renal disease: Secondary | ICD-10-CM | POA: Diagnosis not present

## 2013-11-25 DIAGNOSIS — N186 End stage renal disease: Secondary | ICD-10-CM | POA: Diagnosis not present

## 2013-11-26 DIAGNOSIS — N186 End stage renal disease: Secondary | ICD-10-CM | POA: Diagnosis not present

## 2013-11-26 DIAGNOSIS — D509 Iron deficiency anemia, unspecified: Secondary | ICD-10-CM | POA: Diagnosis not present

## 2013-11-26 DIAGNOSIS — N2581 Secondary hyperparathyroidism of renal origin: Secondary | ICD-10-CM | POA: Diagnosis not present

## 2013-11-26 DIAGNOSIS — D631 Anemia in chronic kidney disease: Secondary | ICD-10-CM | POA: Diagnosis not present

## 2013-12-17 DIAGNOSIS — N186 End stage renal disease: Secondary | ICD-10-CM | POA: Diagnosis not present

## 2013-12-25 DIAGNOSIS — N186 End stage renal disease: Secondary | ICD-10-CM | POA: Diagnosis not present

## 2013-12-26 DIAGNOSIS — N039 Chronic nephritic syndrome with unspecified morphologic changes: Secondary | ICD-10-CM | POA: Diagnosis not present

## 2013-12-26 DIAGNOSIS — N2581 Secondary hyperparathyroidism of renal origin: Secondary | ICD-10-CM | POA: Diagnosis not present

## 2013-12-26 DIAGNOSIS — D631 Anemia in chronic kidney disease: Secondary | ICD-10-CM | POA: Diagnosis not present

## 2013-12-26 DIAGNOSIS — D509 Iron deficiency anemia, unspecified: Secondary | ICD-10-CM | POA: Diagnosis not present

## 2013-12-26 DIAGNOSIS — N186 End stage renal disease: Secondary | ICD-10-CM | POA: Diagnosis not present

## 2014-01-21 DIAGNOSIS — N186 End stage renal disease: Secondary | ICD-10-CM | POA: Diagnosis not present

## 2014-01-25 DIAGNOSIS — N186 End stage renal disease: Secondary | ICD-10-CM | POA: Diagnosis not present

## 2014-01-26 DIAGNOSIS — D509 Iron deficiency anemia, unspecified: Secondary | ICD-10-CM | POA: Diagnosis not present

## 2014-01-26 DIAGNOSIS — N2581 Secondary hyperparathyroidism of renal origin: Secondary | ICD-10-CM | POA: Diagnosis not present

## 2014-01-26 DIAGNOSIS — N186 End stage renal disease: Secondary | ICD-10-CM | POA: Diagnosis not present

## 2014-01-26 DIAGNOSIS — D631 Anemia in chronic kidney disease: Secondary | ICD-10-CM | POA: Diagnosis not present

## 2014-02-18 DIAGNOSIS — N186 End stage renal disease: Secondary | ICD-10-CM | POA: Diagnosis not present

## 2014-02-24 DIAGNOSIS — N186 End stage renal disease: Secondary | ICD-10-CM | POA: Diagnosis not present

## 2014-02-25 DIAGNOSIS — N2581 Secondary hyperparathyroidism of renal origin: Secondary | ICD-10-CM | POA: Diagnosis not present

## 2014-02-25 DIAGNOSIS — N186 End stage renal disease: Secondary | ICD-10-CM | POA: Diagnosis not present

## 2014-02-25 DIAGNOSIS — D509 Iron deficiency anemia, unspecified: Secondary | ICD-10-CM | POA: Diagnosis not present

## 2014-03-27 DIAGNOSIS — N186 End stage renal disease: Secondary | ICD-10-CM | POA: Diagnosis not present

## 2014-03-30 DIAGNOSIS — N2581 Secondary hyperparathyroidism of renal origin: Secondary | ICD-10-CM | POA: Diagnosis not present

## 2014-03-30 DIAGNOSIS — N186 End stage renal disease: Secondary | ICD-10-CM | POA: Diagnosis not present

## 2014-03-30 DIAGNOSIS — Z23 Encounter for immunization: Secondary | ICD-10-CM | POA: Diagnosis not present

## 2014-03-30 DIAGNOSIS — D509 Iron deficiency anemia, unspecified: Secondary | ICD-10-CM | POA: Diagnosis not present

## 2014-04-01 DIAGNOSIS — Z23 Encounter for immunization: Secondary | ICD-10-CM | POA: Diagnosis not present

## 2014-04-01 DIAGNOSIS — N186 End stage renal disease: Secondary | ICD-10-CM | POA: Diagnosis not present

## 2014-04-01 DIAGNOSIS — N2581 Secondary hyperparathyroidism of renal origin: Secondary | ICD-10-CM | POA: Diagnosis not present

## 2014-04-01 DIAGNOSIS — D509 Iron deficiency anemia, unspecified: Secondary | ICD-10-CM | POA: Diagnosis not present

## 2014-04-03 DIAGNOSIS — D509 Iron deficiency anemia, unspecified: Secondary | ICD-10-CM | POA: Diagnosis not present

## 2014-04-03 DIAGNOSIS — N186 End stage renal disease: Secondary | ICD-10-CM | POA: Diagnosis not present

## 2014-04-03 DIAGNOSIS — N2581 Secondary hyperparathyroidism of renal origin: Secondary | ICD-10-CM | POA: Diagnosis not present

## 2014-04-03 DIAGNOSIS — Z23 Encounter for immunization: Secondary | ICD-10-CM | POA: Diagnosis not present

## 2014-04-06 DIAGNOSIS — N186 End stage renal disease: Secondary | ICD-10-CM | POA: Diagnosis not present

## 2014-04-06 DIAGNOSIS — D509 Iron deficiency anemia, unspecified: Secondary | ICD-10-CM | POA: Diagnosis not present

## 2014-04-06 DIAGNOSIS — N2581 Secondary hyperparathyroidism of renal origin: Secondary | ICD-10-CM | POA: Diagnosis not present

## 2014-04-06 DIAGNOSIS — Z23 Encounter for immunization: Secondary | ICD-10-CM | POA: Diagnosis not present

## 2014-04-08 DIAGNOSIS — N186 End stage renal disease: Secondary | ICD-10-CM | POA: Diagnosis not present

## 2014-04-08 DIAGNOSIS — Z23 Encounter for immunization: Secondary | ICD-10-CM | POA: Diagnosis not present

## 2014-04-08 DIAGNOSIS — D509 Iron deficiency anemia, unspecified: Secondary | ICD-10-CM | POA: Diagnosis not present

## 2014-04-08 DIAGNOSIS — N2581 Secondary hyperparathyroidism of renal origin: Secondary | ICD-10-CM | POA: Diagnosis not present

## 2014-04-10 DIAGNOSIS — Z23 Encounter for immunization: Secondary | ICD-10-CM | POA: Diagnosis not present

## 2014-04-10 DIAGNOSIS — N2581 Secondary hyperparathyroidism of renal origin: Secondary | ICD-10-CM | POA: Diagnosis not present

## 2014-04-10 DIAGNOSIS — N186 End stage renal disease: Secondary | ICD-10-CM | POA: Diagnosis not present

## 2014-04-10 DIAGNOSIS — D509 Iron deficiency anemia, unspecified: Secondary | ICD-10-CM | POA: Diagnosis not present

## 2014-04-13 DIAGNOSIS — N186 End stage renal disease: Secondary | ICD-10-CM | POA: Diagnosis not present

## 2014-04-13 DIAGNOSIS — N2581 Secondary hyperparathyroidism of renal origin: Secondary | ICD-10-CM | POA: Diagnosis not present

## 2014-04-13 DIAGNOSIS — Z23 Encounter for immunization: Secondary | ICD-10-CM | POA: Diagnosis not present

## 2014-04-13 DIAGNOSIS — D509 Iron deficiency anemia, unspecified: Secondary | ICD-10-CM | POA: Diagnosis not present

## 2014-04-15 DIAGNOSIS — Z23 Encounter for immunization: Secondary | ICD-10-CM | POA: Diagnosis not present

## 2014-04-15 DIAGNOSIS — N186 End stage renal disease: Secondary | ICD-10-CM | POA: Diagnosis not present

## 2014-04-15 DIAGNOSIS — N2581 Secondary hyperparathyroidism of renal origin: Secondary | ICD-10-CM | POA: Diagnosis not present

## 2014-04-15 DIAGNOSIS — D509 Iron deficiency anemia, unspecified: Secondary | ICD-10-CM | POA: Diagnosis not present

## 2014-04-17 DIAGNOSIS — Z23 Encounter for immunization: Secondary | ICD-10-CM | POA: Diagnosis not present

## 2014-04-17 DIAGNOSIS — N186 End stage renal disease: Secondary | ICD-10-CM | POA: Diagnosis not present

## 2014-04-17 DIAGNOSIS — D509 Iron deficiency anemia, unspecified: Secondary | ICD-10-CM | POA: Diagnosis not present

## 2014-04-17 DIAGNOSIS — N2581 Secondary hyperparathyroidism of renal origin: Secondary | ICD-10-CM | POA: Diagnosis not present

## 2014-04-20 DIAGNOSIS — N2581 Secondary hyperparathyroidism of renal origin: Secondary | ICD-10-CM | POA: Diagnosis not present

## 2014-04-20 DIAGNOSIS — Z23 Encounter for immunization: Secondary | ICD-10-CM | POA: Diagnosis not present

## 2014-04-20 DIAGNOSIS — D509 Iron deficiency anemia, unspecified: Secondary | ICD-10-CM | POA: Diagnosis not present

## 2014-04-20 DIAGNOSIS — N186 End stage renal disease: Secondary | ICD-10-CM | POA: Diagnosis not present

## 2014-04-22 DIAGNOSIS — Z23 Encounter for immunization: Secondary | ICD-10-CM | POA: Diagnosis not present

## 2014-04-22 DIAGNOSIS — N2581 Secondary hyperparathyroidism of renal origin: Secondary | ICD-10-CM | POA: Diagnosis not present

## 2014-04-22 DIAGNOSIS — N186 End stage renal disease: Secondary | ICD-10-CM | POA: Diagnosis not present

## 2014-04-22 DIAGNOSIS — D509 Iron deficiency anemia, unspecified: Secondary | ICD-10-CM | POA: Diagnosis not present

## 2014-04-24 DIAGNOSIS — Z23 Encounter for immunization: Secondary | ICD-10-CM | POA: Diagnosis not present

## 2014-04-24 DIAGNOSIS — N2581 Secondary hyperparathyroidism of renal origin: Secondary | ICD-10-CM | POA: Diagnosis not present

## 2014-04-24 DIAGNOSIS — D509 Iron deficiency anemia, unspecified: Secondary | ICD-10-CM | POA: Diagnosis not present

## 2014-04-24 DIAGNOSIS — N186 End stage renal disease: Secondary | ICD-10-CM | POA: Diagnosis not present

## 2014-04-27 DIAGNOSIS — D509 Iron deficiency anemia, unspecified: Secondary | ICD-10-CM | POA: Diagnosis not present

## 2014-04-27 DIAGNOSIS — N2581 Secondary hyperparathyroidism of renal origin: Secondary | ICD-10-CM | POA: Diagnosis not present

## 2014-04-27 DIAGNOSIS — Z23 Encounter for immunization: Secondary | ICD-10-CM | POA: Diagnosis not present

## 2014-04-27 DIAGNOSIS — N186 End stage renal disease: Secondary | ICD-10-CM | POA: Diagnosis not present

## 2014-04-29 DIAGNOSIS — Z23 Encounter for immunization: Secondary | ICD-10-CM | POA: Diagnosis not present

## 2014-04-29 DIAGNOSIS — N186 End stage renal disease: Secondary | ICD-10-CM | POA: Diagnosis not present

## 2014-04-29 DIAGNOSIS — E0590 Thyrotoxicosis, unspecified without thyrotoxic crisis or storm: Secondary | ICD-10-CM | POA: Diagnosis not present

## 2014-04-29 DIAGNOSIS — N2581 Secondary hyperparathyroidism of renal origin: Secondary | ICD-10-CM | POA: Diagnosis not present

## 2014-04-29 DIAGNOSIS — D631 Anemia in chronic kidney disease: Secondary | ICD-10-CM | POA: Diagnosis not present

## 2014-04-29 DIAGNOSIS — D509 Iron deficiency anemia, unspecified: Secondary | ICD-10-CM | POA: Diagnosis not present

## 2014-05-01 DIAGNOSIS — D509 Iron deficiency anemia, unspecified: Secondary | ICD-10-CM | POA: Diagnosis not present

## 2014-05-01 DIAGNOSIS — N039 Chronic nephritic syndrome with unspecified morphologic changes: Secondary | ICD-10-CM | POA: Diagnosis not present

## 2014-05-01 DIAGNOSIS — N2581 Secondary hyperparathyroidism of renal origin: Secondary | ICD-10-CM | POA: Diagnosis not present

## 2014-05-01 DIAGNOSIS — N186 End stage renal disease: Secondary | ICD-10-CM | POA: Diagnosis not present

## 2014-05-01 DIAGNOSIS — D631 Anemia in chronic kidney disease: Secondary | ICD-10-CM | POA: Diagnosis not present

## 2014-05-01 DIAGNOSIS — Z23 Encounter for immunization: Secondary | ICD-10-CM | POA: Diagnosis not present

## 2014-05-04 DIAGNOSIS — N186 End stage renal disease: Secondary | ICD-10-CM | POA: Diagnosis not present

## 2014-05-04 DIAGNOSIS — N2581 Secondary hyperparathyroidism of renal origin: Secondary | ICD-10-CM | POA: Diagnosis not present

## 2014-05-04 DIAGNOSIS — D631 Anemia in chronic kidney disease: Secondary | ICD-10-CM | POA: Diagnosis not present

## 2014-05-04 DIAGNOSIS — Z23 Encounter for immunization: Secondary | ICD-10-CM | POA: Diagnosis not present

## 2014-05-04 DIAGNOSIS — D509 Iron deficiency anemia, unspecified: Secondary | ICD-10-CM | POA: Diagnosis not present

## 2014-05-06 DIAGNOSIS — N186 End stage renal disease: Secondary | ICD-10-CM | POA: Diagnosis not present

## 2014-05-06 DIAGNOSIS — N039 Chronic nephritic syndrome with unspecified morphologic changes: Secondary | ICD-10-CM | POA: Diagnosis not present

## 2014-05-06 DIAGNOSIS — D631 Anemia in chronic kidney disease: Secondary | ICD-10-CM | POA: Diagnosis not present

## 2014-05-06 DIAGNOSIS — N2581 Secondary hyperparathyroidism of renal origin: Secondary | ICD-10-CM | POA: Diagnosis not present

## 2014-05-06 DIAGNOSIS — Z23 Encounter for immunization: Secondary | ICD-10-CM | POA: Diagnosis not present

## 2014-05-06 DIAGNOSIS — D509 Iron deficiency anemia, unspecified: Secondary | ICD-10-CM | POA: Diagnosis not present

## 2014-05-08 DIAGNOSIS — N186 End stage renal disease: Secondary | ICD-10-CM | POA: Diagnosis not present

## 2014-05-08 DIAGNOSIS — Z23 Encounter for immunization: Secondary | ICD-10-CM | POA: Diagnosis not present

## 2014-05-08 DIAGNOSIS — D509 Iron deficiency anemia, unspecified: Secondary | ICD-10-CM | POA: Diagnosis not present

## 2014-05-08 DIAGNOSIS — N2581 Secondary hyperparathyroidism of renal origin: Secondary | ICD-10-CM | POA: Diagnosis not present

## 2014-05-08 DIAGNOSIS — D631 Anemia in chronic kidney disease: Secondary | ICD-10-CM | POA: Diagnosis not present

## 2014-05-11 DIAGNOSIS — D509 Iron deficiency anemia, unspecified: Secondary | ICD-10-CM | POA: Diagnosis not present

## 2014-05-11 DIAGNOSIS — N186 End stage renal disease: Secondary | ICD-10-CM | POA: Diagnosis not present

## 2014-05-11 DIAGNOSIS — D631 Anemia in chronic kidney disease: Secondary | ICD-10-CM | POA: Diagnosis not present

## 2014-05-11 DIAGNOSIS — N039 Chronic nephritic syndrome with unspecified morphologic changes: Secondary | ICD-10-CM | POA: Diagnosis not present

## 2014-05-11 DIAGNOSIS — N2581 Secondary hyperparathyroidism of renal origin: Secondary | ICD-10-CM | POA: Diagnosis not present

## 2014-05-11 DIAGNOSIS — Z23 Encounter for immunization: Secondary | ICD-10-CM | POA: Diagnosis not present

## 2014-05-13 DIAGNOSIS — N186 End stage renal disease: Secondary | ICD-10-CM | POA: Diagnosis not present

## 2014-05-13 DIAGNOSIS — Z23 Encounter for immunization: Secondary | ICD-10-CM | POA: Diagnosis not present

## 2014-05-13 DIAGNOSIS — N2581 Secondary hyperparathyroidism of renal origin: Secondary | ICD-10-CM | POA: Diagnosis not present

## 2014-05-13 DIAGNOSIS — D509 Iron deficiency anemia, unspecified: Secondary | ICD-10-CM | POA: Diagnosis not present

## 2014-05-13 DIAGNOSIS — D631 Anemia in chronic kidney disease: Secondary | ICD-10-CM | POA: Diagnosis not present

## 2014-05-15 DIAGNOSIS — N2581 Secondary hyperparathyroidism of renal origin: Secondary | ICD-10-CM | POA: Diagnosis not present

## 2014-05-15 DIAGNOSIS — D631 Anemia in chronic kidney disease: Secondary | ICD-10-CM | POA: Diagnosis not present

## 2014-05-15 DIAGNOSIS — N186 End stage renal disease: Secondary | ICD-10-CM | POA: Diagnosis not present

## 2014-05-15 DIAGNOSIS — D509 Iron deficiency anemia, unspecified: Secondary | ICD-10-CM | POA: Diagnosis not present

## 2014-05-15 DIAGNOSIS — Z23 Encounter for immunization: Secondary | ICD-10-CM | POA: Diagnosis not present

## 2014-05-18 DIAGNOSIS — N2581 Secondary hyperparathyroidism of renal origin: Secondary | ICD-10-CM | POA: Diagnosis not present

## 2014-05-18 DIAGNOSIS — D631 Anemia in chronic kidney disease: Secondary | ICD-10-CM | POA: Diagnosis not present

## 2014-05-18 DIAGNOSIS — N039 Chronic nephritic syndrome with unspecified morphologic changes: Secondary | ICD-10-CM | POA: Diagnosis not present

## 2014-05-18 DIAGNOSIS — Z23 Encounter for immunization: Secondary | ICD-10-CM | POA: Diagnosis not present

## 2014-05-18 DIAGNOSIS — N186 End stage renal disease: Secondary | ICD-10-CM | POA: Diagnosis not present

## 2014-05-18 DIAGNOSIS — D509 Iron deficiency anemia, unspecified: Secondary | ICD-10-CM | POA: Diagnosis not present

## 2014-05-20 DIAGNOSIS — D631 Anemia in chronic kidney disease: Secondary | ICD-10-CM | POA: Diagnosis not present

## 2014-05-20 DIAGNOSIS — D509 Iron deficiency anemia, unspecified: Secondary | ICD-10-CM | POA: Diagnosis not present

## 2014-05-20 DIAGNOSIS — Z23 Encounter for immunization: Secondary | ICD-10-CM | POA: Diagnosis not present

## 2014-05-20 DIAGNOSIS — N186 End stage renal disease: Secondary | ICD-10-CM | POA: Diagnosis not present

## 2014-05-20 DIAGNOSIS — N2581 Secondary hyperparathyroidism of renal origin: Secondary | ICD-10-CM | POA: Diagnosis not present

## 2014-05-22 DIAGNOSIS — Z23 Encounter for immunization: Secondary | ICD-10-CM | POA: Diagnosis not present

## 2014-05-22 DIAGNOSIS — D631 Anemia in chronic kidney disease: Secondary | ICD-10-CM | POA: Diagnosis not present

## 2014-05-22 DIAGNOSIS — N2581 Secondary hyperparathyroidism of renal origin: Secondary | ICD-10-CM | POA: Diagnosis not present

## 2014-05-22 DIAGNOSIS — D509 Iron deficiency anemia, unspecified: Secondary | ICD-10-CM | POA: Diagnosis not present

## 2014-05-22 DIAGNOSIS — N186 End stage renal disease: Secondary | ICD-10-CM | POA: Diagnosis not present

## 2014-05-25 DIAGNOSIS — D631 Anemia in chronic kidney disease: Secondary | ICD-10-CM | POA: Diagnosis not present

## 2014-05-25 DIAGNOSIS — N039 Chronic nephritic syndrome with unspecified morphologic changes: Secondary | ICD-10-CM | POA: Diagnosis not present

## 2014-05-25 DIAGNOSIS — D509 Iron deficiency anemia, unspecified: Secondary | ICD-10-CM | POA: Diagnosis not present

## 2014-05-25 DIAGNOSIS — Z23 Encounter for immunization: Secondary | ICD-10-CM | POA: Diagnosis not present

## 2014-05-25 DIAGNOSIS — N2581 Secondary hyperparathyroidism of renal origin: Secondary | ICD-10-CM | POA: Diagnosis not present

## 2014-05-25 DIAGNOSIS — N186 End stage renal disease: Secondary | ICD-10-CM | POA: Diagnosis not present

## 2014-05-27 DIAGNOSIS — D631 Anemia in chronic kidney disease: Secondary | ICD-10-CM | POA: Diagnosis not present

## 2014-05-27 DIAGNOSIS — N039 Chronic nephritic syndrome with unspecified morphologic changes: Secondary | ICD-10-CM | POA: Diagnosis not present

## 2014-05-27 DIAGNOSIS — N2581 Secondary hyperparathyroidism of renal origin: Secondary | ICD-10-CM | POA: Diagnosis not present

## 2014-05-27 DIAGNOSIS — Z23 Encounter for immunization: Secondary | ICD-10-CM | POA: Diagnosis not present

## 2014-05-27 DIAGNOSIS — D509 Iron deficiency anemia, unspecified: Secondary | ICD-10-CM | POA: Diagnosis not present

## 2014-05-27 DIAGNOSIS — N186 End stage renal disease: Secondary | ICD-10-CM | POA: Diagnosis not present

## 2014-05-29 DIAGNOSIS — Z23 Encounter for immunization: Secondary | ICD-10-CM | POA: Diagnosis not present

## 2014-05-29 DIAGNOSIS — D631 Anemia in chronic kidney disease: Secondary | ICD-10-CM | POA: Diagnosis not present

## 2014-05-29 DIAGNOSIS — N186 End stage renal disease: Secondary | ICD-10-CM | POA: Diagnosis not present

## 2014-05-29 DIAGNOSIS — N2581 Secondary hyperparathyroidism of renal origin: Secondary | ICD-10-CM | POA: Diagnosis not present

## 2014-06-11 ENCOUNTER — Other Ambulatory Visit: Payer: Self-pay | Admitting: Neurology

## 2014-06-27 DIAGNOSIS — N186 End stage renal disease: Secondary | ICD-10-CM | POA: Diagnosis not present

## 2014-06-27 DIAGNOSIS — Z992 Dependence on renal dialysis: Secondary | ICD-10-CM | POA: Diagnosis not present

## 2014-06-29 DIAGNOSIS — N2581 Secondary hyperparathyroidism of renal origin: Secondary | ICD-10-CM | POA: Diagnosis not present

## 2014-06-29 DIAGNOSIS — N186 End stage renal disease: Secondary | ICD-10-CM | POA: Diagnosis not present

## 2014-06-29 DIAGNOSIS — D631 Anemia in chronic kidney disease: Secondary | ICD-10-CM | POA: Diagnosis not present

## 2014-07-27 DIAGNOSIS — Z992 Dependence on renal dialysis: Secondary | ICD-10-CM | POA: Diagnosis not present

## 2014-07-27 DIAGNOSIS — N186 End stage renal disease: Secondary | ICD-10-CM | POA: Diagnosis not present

## 2014-07-29 DIAGNOSIS — N186 End stage renal disease: Secondary | ICD-10-CM | POA: Diagnosis not present

## 2014-07-29 DIAGNOSIS — N2581 Secondary hyperparathyroidism of renal origin: Secondary | ICD-10-CM | POA: Diagnosis not present

## 2014-08-06 ENCOUNTER — Encounter (HOSPITAL_COMMUNITY): Payer: Self-pay | Admitting: Vascular Surgery

## 2014-08-27 DIAGNOSIS — N186 End stage renal disease: Secondary | ICD-10-CM | POA: Diagnosis not present

## 2014-08-27 DIAGNOSIS — Z992 Dependence on renal dialysis: Secondary | ICD-10-CM | POA: Diagnosis not present

## 2014-08-29 DIAGNOSIS — N186 End stage renal disease: Secondary | ICD-10-CM | POA: Diagnosis not present

## 2014-08-29 DIAGNOSIS — N2581 Secondary hyperparathyroidism of renal origin: Secondary | ICD-10-CM | POA: Diagnosis not present

## 2014-08-31 DIAGNOSIS — N186 End stage renal disease: Secondary | ICD-10-CM | POA: Diagnosis not present

## 2014-08-31 DIAGNOSIS — N2581 Secondary hyperparathyroidism of renal origin: Secondary | ICD-10-CM | POA: Diagnosis not present

## 2014-09-02 DIAGNOSIS — N186 End stage renal disease: Secondary | ICD-10-CM | POA: Diagnosis not present

## 2014-09-02 DIAGNOSIS — N2581 Secondary hyperparathyroidism of renal origin: Secondary | ICD-10-CM | POA: Diagnosis not present

## 2014-09-04 DIAGNOSIS — N186 End stage renal disease: Secondary | ICD-10-CM | POA: Diagnosis not present

## 2014-09-04 DIAGNOSIS — N2581 Secondary hyperparathyroidism of renal origin: Secondary | ICD-10-CM | POA: Diagnosis not present

## 2014-09-07 DIAGNOSIS — N186 End stage renal disease: Secondary | ICD-10-CM | POA: Diagnosis not present

## 2014-09-07 DIAGNOSIS — N2581 Secondary hyperparathyroidism of renal origin: Secondary | ICD-10-CM | POA: Diagnosis not present

## 2014-09-09 DIAGNOSIS — N186 End stage renal disease: Secondary | ICD-10-CM | POA: Diagnosis not present

## 2014-09-09 DIAGNOSIS — N2581 Secondary hyperparathyroidism of renal origin: Secondary | ICD-10-CM | POA: Diagnosis not present

## 2014-09-11 DIAGNOSIS — N2581 Secondary hyperparathyroidism of renal origin: Secondary | ICD-10-CM | POA: Diagnosis not present

## 2014-09-11 DIAGNOSIS — N186 End stage renal disease: Secondary | ICD-10-CM | POA: Diagnosis not present

## 2014-09-14 DIAGNOSIS — N2581 Secondary hyperparathyroidism of renal origin: Secondary | ICD-10-CM | POA: Diagnosis not present

## 2014-09-14 DIAGNOSIS — N186 End stage renal disease: Secondary | ICD-10-CM | POA: Diagnosis not present

## 2014-09-16 DIAGNOSIS — N186 End stage renal disease: Secondary | ICD-10-CM | POA: Diagnosis not present

## 2014-09-16 DIAGNOSIS — N2581 Secondary hyperparathyroidism of renal origin: Secondary | ICD-10-CM | POA: Diagnosis not present

## 2014-09-18 DIAGNOSIS — N186 End stage renal disease: Secondary | ICD-10-CM | POA: Diagnosis not present

## 2014-09-18 DIAGNOSIS — N2581 Secondary hyperparathyroidism of renal origin: Secondary | ICD-10-CM | POA: Diagnosis not present

## 2014-09-21 DIAGNOSIS — N2581 Secondary hyperparathyroidism of renal origin: Secondary | ICD-10-CM | POA: Diagnosis not present

## 2014-09-21 DIAGNOSIS — N186 End stage renal disease: Secondary | ICD-10-CM | POA: Diagnosis not present

## 2014-09-23 DIAGNOSIS — N2581 Secondary hyperparathyroidism of renal origin: Secondary | ICD-10-CM | POA: Diagnosis not present

## 2014-09-23 DIAGNOSIS — N186 End stage renal disease: Secondary | ICD-10-CM | POA: Diagnosis not present

## 2014-09-25 DIAGNOSIS — N2581 Secondary hyperparathyroidism of renal origin: Secondary | ICD-10-CM | POA: Diagnosis not present

## 2014-09-25 DIAGNOSIS — N186 End stage renal disease: Secondary | ICD-10-CM | POA: Diagnosis not present

## 2014-09-27 DIAGNOSIS — Z992 Dependence on renal dialysis: Secondary | ICD-10-CM | POA: Diagnosis not present

## 2014-09-27 DIAGNOSIS — N186 End stage renal disease: Secondary | ICD-10-CM | POA: Diagnosis not present

## 2014-09-28 DIAGNOSIS — Z23 Encounter for immunization: Secondary | ICD-10-CM | POA: Diagnosis not present

## 2014-09-28 DIAGNOSIS — D509 Iron deficiency anemia, unspecified: Secondary | ICD-10-CM | POA: Diagnosis not present

## 2014-09-28 DIAGNOSIS — N2581 Secondary hyperparathyroidism of renal origin: Secondary | ICD-10-CM | POA: Diagnosis not present

## 2014-09-28 DIAGNOSIS — N186 End stage renal disease: Secondary | ICD-10-CM | POA: Diagnosis not present

## 2014-09-28 DIAGNOSIS — D631 Anemia in chronic kidney disease: Secondary | ICD-10-CM | POA: Diagnosis not present

## 2014-09-30 DIAGNOSIS — N2581 Secondary hyperparathyroidism of renal origin: Secondary | ICD-10-CM | POA: Diagnosis not present

## 2014-09-30 DIAGNOSIS — D631 Anemia in chronic kidney disease: Secondary | ICD-10-CM | POA: Diagnosis not present

## 2014-09-30 DIAGNOSIS — D509 Iron deficiency anemia, unspecified: Secondary | ICD-10-CM | POA: Diagnosis not present

## 2014-09-30 DIAGNOSIS — Z23 Encounter for immunization: Secondary | ICD-10-CM | POA: Diagnosis not present

## 2014-09-30 DIAGNOSIS — N186 End stage renal disease: Secondary | ICD-10-CM | POA: Diagnosis not present

## 2014-10-02 DIAGNOSIS — N2581 Secondary hyperparathyroidism of renal origin: Secondary | ICD-10-CM | POA: Diagnosis not present

## 2014-10-02 DIAGNOSIS — N186 End stage renal disease: Secondary | ICD-10-CM | POA: Diagnosis not present

## 2014-10-02 DIAGNOSIS — Z23 Encounter for immunization: Secondary | ICD-10-CM | POA: Diagnosis not present

## 2014-10-02 DIAGNOSIS — D631 Anemia in chronic kidney disease: Secondary | ICD-10-CM | POA: Diagnosis not present

## 2014-10-02 DIAGNOSIS — D509 Iron deficiency anemia, unspecified: Secondary | ICD-10-CM | POA: Diagnosis not present

## 2014-10-05 DIAGNOSIS — N186 End stage renal disease: Secondary | ICD-10-CM | POA: Diagnosis not present

## 2014-10-05 DIAGNOSIS — D509 Iron deficiency anemia, unspecified: Secondary | ICD-10-CM | POA: Diagnosis not present

## 2014-10-05 DIAGNOSIS — D631 Anemia in chronic kidney disease: Secondary | ICD-10-CM | POA: Diagnosis not present

## 2014-10-05 DIAGNOSIS — Z23 Encounter for immunization: Secondary | ICD-10-CM | POA: Diagnosis not present

## 2014-10-05 DIAGNOSIS — N2581 Secondary hyperparathyroidism of renal origin: Secondary | ICD-10-CM | POA: Diagnosis not present

## 2014-10-07 DIAGNOSIS — D509 Iron deficiency anemia, unspecified: Secondary | ICD-10-CM | POA: Diagnosis not present

## 2014-10-07 DIAGNOSIS — N186 End stage renal disease: Secondary | ICD-10-CM | POA: Diagnosis not present

## 2014-10-07 DIAGNOSIS — Z23 Encounter for immunization: Secondary | ICD-10-CM | POA: Diagnosis not present

## 2014-10-07 DIAGNOSIS — N2581 Secondary hyperparathyroidism of renal origin: Secondary | ICD-10-CM | POA: Diagnosis not present

## 2014-10-07 DIAGNOSIS — D631 Anemia in chronic kidney disease: Secondary | ICD-10-CM | POA: Diagnosis not present

## 2014-10-09 DIAGNOSIS — N2581 Secondary hyperparathyroidism of renal origin: Secondary | ICD-10-CM | POA: Diagnosis not present

## 2014-10-09 DIAGNOSIS — N186 End stage renal disease: Secondary | ICD-10-CM | POA: Diagnosis not present

## 2014-10-09 DIAGNOSIS — D631 Anemia in chronic kidney disease: Secondary | ICD-10-CM | POA: Diagnosis not present

## 2014-10-09 DIAGNOSIS — Z23 Encounter for immunization: Secondary | ICD-10-CM | POA: Diagnosis not present

## 2014-10-09 DIAGNOSIS — D509 Iron deficiency anemia, unspecified: Secondary | ICD-10-CM | POA: Diagnosis not present

## 2014-10-12 DIAGNOSIS — Z23 Encounter for immunization: Secondary | ICD-10-CM | POA: Diagnosis not present

## 2014-10-12 DIAGNOSIS — D631 Anemia in chronic kidney disease: Secondary | ICD-10-CM | POA: Diagnosis not present

## 2014-10-12 DIAGNOSIS — N2581 Secondary hyperparathyroidism of renal origin: Secondary | ICD-10-CM | POA: Diagnosis not present

## 2014-10-12 DIAGNOSIS — N186 End stage renal disease: Secondary | ICD-10-CM | POA: Diagnosis not present

## 2014-10-12 DIAGNOSIS — D509 Iron deficiency anemia, unspecified: Secondary | ICD-10-CM | POA: Diagnosis not present

## 2014-10-14 DIAGNOSIS — Z23 Encounter for immunization: Secondary | ICD-10-CM | POA: Diagnosis not present

## 2014-10-14 DIAGNOSIS — D631 Anemia in chronic kidney disease: Secondary | ICD-10-CM | POA: Diagnosis not present

## 2014-10-14 DIAGNOSIS — D509 Iron deficiency anemia, unspecified: Secondary | ICD-10-CM | POA: Diagnosis not present

## 2014-10-14 DIAGNOSIS — N186 End stage renal disease: Secondary | ICD-10-CM | POA: Diagnosis not present

## 2014-10-14 DIAGNOSIS — N2581 Secondary hyperparathyroidism of renal origin: Secondary | ICD-10-CM | POA: Diagnosis not present

## 2014-10-16 DIAGNOSIS — N2581 Secondary hyperparathyroidism of renal origin: Secondary | ICD-10-CM | POA: Diagnosis not present

## 2014-10-16 DIAGNOSIS — D631 Anemia in chronic kidney disease: Secondary | ICD-10-CM | POA: Diagnosis not present

## 2014-10-16 DIAGNOSIS — D509 Iron deficiency anemia, unspecified: Secondary | ICD-10-CM | POA: Diagnosis not present

## 2014-10-16 DIAGNOSIS — N186 End stage renal disease: Secondary | ICD-10-CM | POA: Diagnosis not present

## 2014-10-16 DIAGNOSIS — Z23 Encounter for immunization: Secondary | ICD-10-CM | POA: Diagnosis not present

## 2014-10-19 DIAGNOSIS — Z23 Encounter for immunization: Secondary | ICD-10-CM | POA: Diagnosis not present

## 2014-10-19 DIAGNOSIS — D631 Anemia in chronic kidney disease: Secondary | ICD-10-CM | POA: Diagnosis not present

## 2014-10-19 DIAGNOSIS — N186 End stage renal disease: Secondary | ICD-10-CM | POA: Diagnosis not present

## 2014-10-19 DIAGNOSIS — N2581 Secondary hyperparathyroidism of renal origin: Secondary | ICD-10-CM | POA: Diagnosis not present

## 2014-10-19 DIAGNOSIS — D509 Iron deficiency anemia, unspecified: Secondary | ICD-10-CM | POA: Diagnosis not present

## 2014-10-21 DIAGNOSIS — D631 Anemia in chronic kidney disease: Secondary | ICD-10-CM | POA: Diagnosis not present

## 2014-10-21 DIAGNOSIS — N2581 Secondary hyperparathyroidism of renal origin: Secondary | ICD-10-CM | POA: Diagnosis not present

## 2014-10-21 DIAGNOSIS — D509 Iron deficiency anemia, unspecified: Secondary | ICD-10-CM | POA: Diagnosis not present

## 2014-10-21 DIAGNOSIS — Z23 Encounter for immunization: Secondary | ICD-10-CM | POA: Diagnosis not present

## 2014-10-21 DIAGNOSIS — N186 End stage renal disease: Secondary | ICD-10-CM | POA: Diagnosis not present

## 2014-10-23 DIAGNOSIS — D509 Iron deficiency anemia, unspecified: Secondary | ICD-10-CM | POA: Diagnosis not present

## 2014-10-23 DIAGNOSIS — Z23 Encounter for immunization: Secondary | ICD-10-CM | POA: Diagnosis not present

## 2014-10-23 DIAGNOSIS — N186 End stage renal disease: Secondary | ICD-10-CM | POA: Diagnosis not present

## 2014-10-23 DIAGNOSIS — D631 Anemia in chronic kidney disease: Secondary | ICD-10-CM | POA: Diagnosis not present

## 2014-10-23 DIAGNOSIS — N2581 Secondary hyperparathyroidism of renal origin: Secondary | ICD-10-CM | POA: Diagnosis not present

## 2014-10-26 DIAGNOSIS — N186 End stage renal disease: Secondary | ICD-10-CM | POA: Diagnosis not present

## 2014-10-26 DIAGNOSIS — D509 Iron deficiency anemia, unspecified: Secondary | ICD-10-CM | POA: Diagnosis not present

## 2014-10-26 DIAGNOSIS — N2581 Secondary hyperparathyroidism of renal origin: Secondary | ICD-10-CM | POA: Diagnosis not present

## 2014-10-26 DIAGNOSIS — D631 Anemia in chronic kidney disease: Secondary | ICD-10-CM | POA: Diagnosis not present

## 2014-10-26 DIAGNOSIS — Z23 Encounter for immunization: Secondary | ICD-10-CM | POA: Diagnosis not present

## 2014-10-26 DIAGNOSIS — Z992 Dependence on renal dialysis: Secondary | ICD-10-CM | POA: Diagnosis not present

## 2014-10-28 DIAGNOSIS — N186 End stage renal disease: Secondary | ICD-10-CM | POA: Diagnosis not present

## 2014-10-28 DIAGNOSIS — D631 Anemia in chronic kidney disease: Secondary | ICD-10-CM | POA: Diagnosis not present

## 2014-10-28 DIAGNOSIS — N2581 Secondary hyperparathyroidism of renal origin: Secondary | ICD-10-CM | POA: Diagnosis not present

## 2014-10-30 DIAGNOSIS — N186 End stage renal disease: Secondary | ICD-10-CM | POA: Diagnosis not present

## 2014-10-30 DIAGNOSIS — D631 Anemia in chronic kidney disease: Secondary | ICD-10-CM | POA: Diagnosis not present

## 2014-10-30 DIAGNOSIS — N2581 Secondary hyperparathyroidism of renal origin: Secondary | ICD-10-CM | POA: Diagnosis not present

## 2014-11-02 DIAGNOSIS — N186 End stage renal disease: Secondary | ICD-10-CM | POA: Diagnosis not present

## 2014-11-02 DIAGNOSIS — D631 Anemia in chronic kidney disease: Secondary | ICD-10-CM | POA: Diagnosis not present

## 2014-11-02 DIAGNOSIS — N2581 Secondary hyperparathyroidism of renal origin: Secondary | ICD-10-CM | POA: Diagnosis not present

## 2014-11-04 DIAGNOSIS — D631 Anemia in chronic kidney disease: Secondary | ICD-10-CM | POA: Diagnosis not present

## 2014-11-04 DIAGNOSIS — N2581 Secondary hyperparathyroidism of renal origin: Secondary | ICD-10-CM | POA: Diagnosis not present

## 2014-11-04 DIAGNOSIS — N186 End stage renal disease: Secondary | ICD-10-CM | POA: Diagnosis not present

## 2014-11-06 DIAGNOSIS — N186 End stage renal disease: Secondary | ICD-10-CM | POA: Diagnosis not present

## 2014-11-06 DIAGNOSIS — N2581 Secondary hyperparathyroidism of renal origin: Secondary | ICD-10-CM | POA: Diagnosis not present

## 2014-11-06 DIAGNOSIS — D631 Anemia in chronic kidney disease: Secondary | ICD-10-CM | POA: Diagnosis not present

## 2014-11-09 DIAGNOSIS — N2581 Secondary hyperparathyroidism of renal origin: Secondary | ICD-10-CM | POA: Diagnosis not present

## 2014-11-09 DIAGNOSIS — D631 Anemia in chronic kidney disease: Secondary | ICD-10-CM | POA: Diagnosis not present

## 2014-11-09 DIAGNOSIS — N186 End stage renal disease: Secondary | ICD-10-CM | POA: Diagnosis not present

## 2014-11-11 DIAGNOSIS — D631 Anemia in chronic kidney disease: Secondary | ICD-10-CM | POA: Diagnosis not present

## 2014-11-11 DIAGNOSIS — N186 End stage renal disease: Secondary | ICD-10-CM | POA: Diagnosis not present

## 2014-11-11 DIAGNOSIS — N2581 Secondary hyperparathyroidism of renal origin: Secondary | ICD-10-CM | POA: Diagnosis not present

## 2014-11-13 DIAGNOSIS — D631 Anemia in chronic kidney disease: Secondary | ICD-10-CM | POA: Diagnosis not present

## 2014-11-13 DIAGNOSIS — N2581 Secondary hyperparathyroidism of renal origin: Secondary | ICD-10-CM | POA: Diagnosis not present

## 2014-11-13 DIAGNOSIS — N186 End stage renal disease: Secondary | ICD-10-CM | POA: Diagnosis not present

## 2014-11-16 DIAGNOSIS — N2581 Secondary hyperparathyroidism of renal origin: Secondary | ICD-10-CM | POA: Diagnosis not present

## 2014-11-16 DIAGNOSIS — D631 Anemia in chronic kidney disease: Secondary | ICD-10-CM | POA: Diagnosis not present

## 2014-11-16 DIAGNOSIS — N186 End stage renal disease: Secondary | ICD-10-CM | POA: Diagnosis not present

## 2014-11-18 DIAGNOSIS — D631 Anemia in chronic kidney disease: Secondary | ICD-10-CM | POA: Diagnosis not present

## 2014-11-18 DIAGNOSIS — N2581 Secondary hyperparathyroidism of renal origin: Secondary | ICD-10-CM | POA: Diagnosis not present

## 2014-11-18 DIAGNOSIS — N186 End stage renal disease: Secondary | ICD-10-CM | POA: Diagnosis not present

## 2014-11-20 DIAGNOSIS — D631 Anemia in chronic kidney disease: Secondary | ICD-10-CM | POA: Diagnosis not present

## 2014-11-20 DIAGNOSIS — N186 End stage renal disease: Secondary | ICD-10-CM | POA: Diagnosis not present

## 2014-11-20 DIAGNOSIS — N2581 Secondary hyperparathyroidism of renal origin: Secondary | ICD-10-CM | POA: Diagnosis not present

## 2014-11-23 DIAGNOSIS — D631 Anemia in chronic kidney disease: Secondary | ICD-10-CM | POA: Diagnosis not present

## 2014-11-23 DIAGNOSIS — N2581 Secondary hyperparathyroidism of renal origin: Secondary | ICD-10-CM | POA: Diagnosis not present

## 2014-11-23 DIAGNOSIS — N186 End stage renal disease: Secondary | ICD-10-CM | POA: Diagnosis not present

## 2014-11-25 DIAGNOSIS — D631 Anemia in chronic kidney disease: Secondary | ICD-10-CM | POA: Diagnosis not present

## 2014-11-25 DIAGNOSIS — N186 End stage renal disease: Secondary | ICD-10-CM | POA: Diagnosis not present

## 2014-11-25 DIAGNOSIS — N2581 Secondary hyperparathyroidism of renal origin: Secondary | ICD-10-CM | POA: Diagnosis not present

## 2014-11-26 DIAGNOSIS — N6452 Nipple discharge: Secondary | ICD-10-CM | POA: Diagnosis not present

## 2014-11-26 DIAGNOSIS — N186 End stage renal disease: Secondary | ICD-10-CM | POA: Diagnosis not present

## 2014-11-26 DIAGNOSIS — N62 Hypertrophy of breast: Secondary | ICD-10-CM | POA: Diagnosis not present

## 2014-11-26 DIAGNOSIS — N644 Mastodynia: Secondary | ICD-10-CM | POA: Diagnosis not present

## 2014-11-26 DIAGNOSIS — Z992 Dependence on renal dialysis: Secondary | ICD-10-CM | POA: Diagnosis not present

## 2014-11-27 DIAGNOSIS — D631 Anemia in chronic kidney disease: Secondary | ICD-10-CM | POA: Diagnosis not present

## 2014-11-27 DIAGNOSIS — N186 End stage renal disease: Secondary | ICD-10-CM | POA: Diagnosis not present

## 2014-11-27 DIAGNOSIS — N2581 Secondary hyperparathyroidism of renal origin: Secondary | ICD-10-CM | POA: Diagnosis not present

## 2014-11-30 DIAGNOSIS — N2581 Secondary hyperparathyroidism of renal origin: Secondary | ICD-10-CM | POA: Diagnosis not present

## 2014-11-30 DIAGNOSIS — N186 End stage renal disease: Secondary | ICD-10-CM | POA: Diagnosis not present

## 2014-11-30 DIAGNOSIS — D631 Anemia in chronic kidney disease: Secondary | ICD-10-CM | POA: Diagnosis not present

## 2014-12-02 DIAGNOSIS — N186 End stage renal disease: Secondary | ICD-10-CM | POA: Diagnosis not present

## 2014-12-02 DIAGNOSIS — D631 Anemia in chronic kidney disease: Secondary | ICD-10-CM | POA: Diagnosis not present

## 2014-12-02 DIAGNOSIS — N2581 Secondary hyperparathyroidism of renal origin: Secondary | ICD-10-CM | POA: Diagnosis not present

## 2014-12-04 DIAGNOSIS — D631 Anemia in chronic kidney disease: Secondary | ICD-10-CM | POA: Diagnosis not present

## 2014-12-04 DIAGNOSIS — N186 End stage renal disease: Secondary | ICD-10-CM | POA: Diagnosis not present

## 2014-12-04 DIAGNOSIS — N2581 Secondary hyperparathyroidism of renal origin: Secondary | ICD-10-CM | POA: Diagnosis not present

## 2014-12-07 DIAGNOSIS — D631 Anemia in chronic kidney disease: Secondary | ICD-10-CM | POA: Diagnosis not present

## 2014-12-07 DIAGNOSIS — N2581 Secondary hyperparathyroidism of renal origin: Secondary | ICD-10-CM | POA: Diagnosis not present

## 2014-12-07 DIAGNOSIS — N186 End stage renal disease: Secondary | ICD-10-CM | POA: Diagnosis not present

## 2014-12-09 DIAGNOSIS — D631 Anemia in chronic kidney disease: Secondary | ICD-10-CM | POA: Diagnosis not present

## 2014-12-09 DIAGNOSIS — N2581 Secondary hyperparathyroidism of renal origin: Secondary | ICD-10-CM | POA: Diagnosis not present

## 2014-12-09 DIAGNOSIS — N186 End stage renal disease: Secondary | ICD-10-CM | POA: Diagnosis not present

## 2014-12-11 DIAGNOSIS — D631 Anemia in chronic kidney disease: Secondary | ICD-10-CM | POA: Diagnosis not present

## 2014-12-11 DIAGNOSIS — N2581 Secondary hyperparathyroidism of renal origin: Secondary | ICD-10-CM | POA: Diagnosis not present

## 2014-12-11 DIAGNOSIS — N186 End stage renal disease: Secondary | ICD-10-CM | POA: Diagnosis not present

## 2014-12-14 DIAGNOSIS — D631 Anemia in chronic kidney disease: Secondary | ICD-10-CM | POA: Diagnosis not present

## 2014-12-14 DIAGNOSIS — N2581 Secondary hyperparathyroidism of renal origin: Secondary | ICD-10-CM | POA: Diagnosis not present

## 2014-12-14 DIAGNOSIS — N186 End stage renal disease: Secondary | ICD-10-CM | POA: Diagnosis not present

## 2014-12-16 DIAGNOSIS — D631 Anemia in chronic kidney disease: Secondary | ICD-10-CM | POA: Diagnosis not present

## 2014-12-16 DIAGNOSIS — N2581 Secondary hyperparathyroidism of renal origin: Secondary | ICD-10-CM | POA: Diagnosis not present

## 2014-12-16 DIAGNOSIS — N186 End stage renal disease: Secondary | ICD-10-CM | POA: Diagnosis not present

## 2014-12-18 DIAGNOSIS — N2581 Secondary hyperparathyroidism of renal origin: Secondary | ICD-10-CM | POA: Diagnosis not present

## 2014-12-18 DIAGNOSIS — D631 Anemia in chronic kidney disease: Secondary | ICD-10-CM | POA: Diagnosis not present

## 2014-12-18 DIAGNOSIS — N186 End stage renal disease: Secondary | ICD-10-CM | POA: Diagnosis not present

## 2014-12-21 DIAGNOSIS — D631 Anemia in chronic kidney disease: Secondary | ICD-10-CM | POA: Diagnosis not present

## 2014-12-21 DIAGNOSIS — N2581 Secondary hyperparathyroidism of renal origin: Secondary | ICD-10-CM | POA: Diagnosis not present

## 2014-12-21 DIAGNOSIS — N186 End stage renal disease: Secondary | ICD-10-CM | POA: Diagnosis not present

## 2014-12-23 DIAGNOSIS — D631 Anemia in chronic kidney disease: Secondary | ICD-10-CM | POA: Diagnosis not present

## 2014-12-23 DIAGNOSIS — N186 End stage renal disease: Secondary | ICD-10-CM | POA: Diagnosis not present

## 2014-12-23 DIAGNOSIS — N2581 Secondary hyperparathyroidism of renal origin: Secondary | ICD-10-CM | POA: Diagnosis not present

## 2014-12-25 DIAGNOSIS — N186 End stage renal disease: Secondary | ICD-10-CM | POA: Diagnosis not present

## 2014-12-25 DIAGNOSIS — D631 Anemia in chronic kidney disease: Secondary | ICD-10-CM | POA: Diagnosis not present

## 2014-12-25 DIAGNOSIS — N2581 Secondary hyperparathyroidism of renal origin: Secondary | ICD-10-CM | POA: Diagnosis not present

## 2014-12-26 DIAGNOSIS — I120 Hypertensive chronic kidney disease with stage 5 chronic kidney disease or end stage renal disease: Secondary | ICD-10-CM | POA: Diagnosis not present

## 2014-12-26 DIAGNOSIS — N186 End stage renal disease: Secondary | ICD-10-CM | POA: Diagnosis not present

## 2014-12-26 DIAGNOSIS — Z992 Dependence on renal dialysis: Secondary | ICD-10-CM | POA: Diagnosis not present

## 2014-12-28 DIAGNOSIS — N186 End stage renal disease: Secondary | ICD-10-CM | POA: Diagnosis not present

## 2014-12-28 DIAGNOSIS — N2581 Secondary hyperparathyroidism of renal origin: Secondary | ICD-10-CM | POA: Diagnosis not present

## 2014-12-28 DIAGNOSIS — D631 Anemia in chronic kidney disease: Secondary | ICD-10-CM | POA: Diagnosis not present

## 2014-12-30 DIAGNOSIS — N2581 Secondary hyperparathyroidism of renal origin: Secondary | ICD-10-CM | POA: Diagnosis not present

## 2014-12-30 DIAGNOSIS — N186 End stage renal disease: Secondary | ICD-10-CM | POA: Diagnosis not present

## 2014-12-30 DIAGNOSIS — D631 Anemia in chronic kidney disease: Secondary | ICD-10-CM | POA: Diagnosis not present

## 2015-01-01 DIAGNOSIS — D631 Anemia in chronic kidney disease: Secondary | ICD-10-CM | POA: Diagnosis not present

## 2015-01-01 DIAGNOSIS — N186 End stage renal disease: Secondary | ICD-10-CM | POA: Diagnosis not present

## 2015-01-01 DIAGNOSIS — N2581 Secondary hyperparathyroidism of renal origin: Secondary | ICD-10-CM | POA: Diagnosis not present

## 2015-01-04 DIAGNOSIS — N186 End stage renal disease: Secondary | ICD-10-CM | POA: Diagnosis not present

## 2015-01-04 DIAGNOSIS — D631 Anemia in chronic kidney disease: Secondary | ICD-10-CM | POA: Diagnosis not present

## 2015-01-04 DIAGNOSIS — N2581 Secondary hyperparathyroidism of renal origin: Secondary | ICD-10-CM | POA: Diagnosis not present

## 2015-01-06 DIAGNOSIS — N186 End stage renal disease: Secondary | ICD-10-CM | POA: Diagnosis not present

## 2015-01-06 DIAGNOSIS — D631 Anemia in chronic kidney disease: Secondary | ICD-10-CM | POA: Diagnosis not present

## 2015-01-06 DIAGNOSIS — N2581 Secondary hyperparathyroidism of renal origin: Secondary | ICD-10-CM | POA: Diagnosis not present

## 2015-01-08 DIAGNOSIS — N186 End stage renal disease: Secondary | ICD-10-CM | POA: Diagnosis not present

## 2015-01-11 DIAGNOSIS — N2581 Secondary hyperparathyroidism of renal origin: Secondary | ICD-10-CM | POA: Diagnosis not present

## 2015-01-11 DIAGNOSIS — N186 End stage renal disease: Secondary | ICD-10-CM | POA: Diagnosis not present

## 2015-01-11 DIAGNOSIS — D631 Anemia in chronic kidney disease: Secondary | ICD-10-CM | POA: Diagnosis not present

## 2015-01-13 DIAGNOSIS — N2581 Secondary hyperparathyroidism of renal origin: Secondary | ICD-10-CM | POA: Diagnosis not present

## 2015-01-13 DIAGNOSIS — N186 End stage renal disease: Secondary | ICD-10-CM | POA: Diagnosis not present

## 2015-01-13 DIAGNOSIS — D631 Anemia in chronic kidney disease: Secondary | ICD-10-CM | POA: Diagnosis not present

## 2015-01-15 DIAGNOSIS — D631 Anemia in chronic kidney disease: Secondary | ICD-10-CM | POA: Diagnosis not present

## 2015-01-15 DIAGNOSIS — N186 End stage renal disease: Secondary | ICD-10-CM | POA: Diagnosis not present

## 2015-01-15 DIAGNOSIS — N2581 Secondary hyperparathyroidism of renal origin: Secondary | ICD-10-CM | POA: Diagnosis not present

## 2015-01-18 DIAGNOSIS — N186 End stage renal disease: Secondary | ICD-10-CM | POA: Diagnosis not present

## 2015-01-18 DIAGNOSIS — N2581 Secondary hyperparathyroidism of renal origin: Secondary | ICD-10-CM | POA: Diagnosis not present

## 2015-01-18 DIAGNOSIS — D631 Anemia in chronic kidney disease: Secondary | ICD-10-CM | POA: Diagnosis not present

## 2015-01-20 DIAGNOSIS — N186 End stage renal disease: Secondary | ICD-10-CM | POA: Diagnosis not present

## 2015-01-20 DIAGNOSIS — N2581 Secondary hyperparathyroidism of renal origin: Secondary | ICD-10-CM | POA: Diagnosis not present

## 2015-01-20 DIAGNOSIS — D631 Anemia in chronic kidney disease: Secondary | ICD-10-CM | POA: Diagnosis not present

## 2015-01-22 DIAGNOSIS — N186 End stage renal disease: Secondary | ICD-10-CM | POA: Diagnosis not present

## 2015-01-22 DIAGNOSIS — D631 Anemia in chronic kidney disease: Secondary | ICD-10-CM | POA: Diagnosis not present

## 2015-01-22 DIAGNOSIS — N2581 Secondary hyperparathyroidism of renal origin: Secondary | ICD-10-CM | POA: Diagnosis not present

## 2015-01-25 DIAGNOSIS — D631 Anemia in chronic kidney disease: Secondary | ICD-10-CM | POA: Diagnosis not present

## 2015-01-25 DIAGNOSIS — N2581 Secondary hyperparathyroidism of renal origin: Secondary | ICD-10-CM | POA: Diagnosis not present

## 2015-01-25 DIAGNOSIS — N186 End stage renal disease: Secondary | ICD-10-CM | POA: Diagnosis not present

## 2015-01-26 DIAGNOSIS — I120 Hypertensive chronic kidney disease with stage 5 chronic kidney disease or end stage renal disease: Secondary | ICD-10-CM | POA: Diagnosis not present

## 2015-01-26 DIAGNOSIS — N186 End stage renal disease: Secondary | ICD-10-CM | POA: Diagnosis not present

## 2015-01-26 DIAGNOSIS — Z992 Dependence on renal dialysis: Secondary | ICD-10-CM | POA: Diagnosis not present

## 2015-01-27 DIAGNOSIS — D631 Anemia in chronic kidney disease: Secondary | ICD-10-CM | POA: Diagnosis not present

## 2015-01-27 DIAGNOSIS — N186 End stage renal disease: Secondary | ICD-10-CM | POA: Diagnosis not present

## 2015-01-27 DIAGNOSIS — N2581 Secondary hyperparathyroidism of renal origin: Secondary | ICD-10-CM | POA: Diagnosis not present

## 2015-01-29 DIAGNOSIS — D631 Anemia in chronic kidney disease: Secondary | ICD-10-CM | POA: Diagnosis not present

## 2015-01-29 DIAGNOSIS — N2581 Secondary hyperparathyroidism of renal origin: Secondary | ICD-10-CM | POA: Diagnosis not present

## 2015-01-29 DIAGNOSIS — N186 End stage renal disease: Secondary | ICD-10-CM | POA: Diagnosis not present

## 2015-02-01 DIAGNOSIS — N2581 Secondary hyperparathyroidism of renal origin: Secondary | ICD-10-CM | POA: Diagnosis not present

## 2015-02-01 DIAGNOSIS — N186 End stage renal disease: Secondary | ICD-10-CM | POA: Diagnosis not present

## 2015-02-01 DIAGNOSIS — D631 Anemia in chronic kidney disease: Secondary | ICD-10-CM | POA: Diagnosis not present

## 2015-02-02 DIAGNOSIS — E291 Testicular hypofunction: Secondary | ICD-10-CM | POA: Diagnosis not present

## 2015-02-02 DIAGNOSIS — N186 End stage renal disease: Secondary | ICD-10-CM | POA: Diagnosis not present

## 2015-02-02 DIAGNOSIS — R51 Headache: Secondary | ICD-10-CM | POA: Diagnosis not present

## 2015-02-02 DIAGNOSIS — E221 Hyperprolactinemia: Secondary | ICD-10-CM | POA: Diagnosis not present

## 2015-02-03 ENCOUNTER — Other Ambulatory Visit: Payer: Self-pay | Admitting: Internal Medicine

## 2015-02-03 DIAGNOSIS — N186 End stage renal disease: Secondary | ICD-10-CM | POA: Diagnosis not present

## 2015-02-03 DIAGNOSIS — N2581 Secondary hyperparathyroidism of renal origin: Secondary | ICD-10-CM | POA: Diagnosis not present

## 2015-02-03 DIAGNOSIS — Z0101 Encounter for examination of eyes and vision with abnormal findings: Secondary | ICD-10-CM

## 2015-02-03 DIAGNOSIS — D631 Anemia in chronic kidney disease: Secondary | ICD-10-CM | POA: Diagnosis not present

## 2015-02-03 DIAGNOSIS — E221 Hyperprolactinemia: Secondary | ICD-10-CM

## 2015-02-04 DIAGNOSIS — E221 Hyperprolactinemia: Secondary | ICD-10-CM | POA: Diagnosis not present

## 2015-02-04 DIAGNOSIS — E291 Testicular hypofunction: Secondary | ICD-10-CM | POA: Diagnosis not present

## 2015-02-05 DIAGNOSIS — D631 Anemia in chronic kidney disease: Secondary | ICD-10-CM | POA: Diagnosis not present

## 2015-02-05 DIAGNOSIS — N2581 Secondary hyperparathyroidism of renal origin: Secondary | ICD-10-CM | POA: Diagnosis not present

## 2015-02-05 DIAGNOSIS — N186 End stage renal disease: Secondary | ICD-10-CM | POA: Diagnosis not present

## 2015-02-08 DIAGNOSIS — D631 Anemia in chronic kidney disease: Secondary | ICD-10-CM | POA: Diagnosis not present

## 2015-02-08 DIAGNOSIS — N186 End stage renal disease: Secondary | ICD-10-CM | POA: Diagnosis not present

## 2015-02-08 DIAGNOSIS — N2581 Secondary hyperparathyroidism of renal origin: Secondary | ICD-10-CM | POA: Diagnosis not present

## 2015-02-10 DIAGNOSIS — N2581 Secondary hyperparathyroidism of renal origin: Secondary | ICD-10-CM | POA: Diagnosis not present

## 2015-02-10 DIAGNOSIS — D631 Anemia in chronic kidney disease: Secondary | ICD-10-CM | POA: Diagnosis not present

## 2015-02-10 DIAGNOSIS — N186 End stage renal disease: Secondary | ICD-10-CM | POA: Diagnosis not present

## 2015-02-12 DIAGNOSIS — D631 Anemia in chronic kidney disease: Secondary | ICD-10-CM | POA: Diagnosis not present

## 2015-02-12 DIAGNOSIS — N186 End stage renal disease: Secondary | ICD-10-CM | POA: Diagnosis not present

## 2015-02-12 DIAGNOSIS — N2581 Secondary hyperparathyroidism of renal origin: Secondary | ICD-10-CM | POA: Diagnosis not present

## 2015-02-15 DIAGNOSIS — N186 End stage renal disease: Secondary | ICD-10-CM | POA: Diagnosis not present

## 2015-02-15 DIAGNOSIS — D631 Anemia in chronic kidney disease: Secondary | ICD-10-CM | POA: Diagnosis not present

## 2015-02-15 DIAGNOSIS — N2581 Secondary hyperparathyroidism of renal origin: Secondary | ICD-10-CM | POA: Diagnosis not present

## 2015-02-17 DIAGNOSIS — D631 Anemia in chronic kidney disease: Secondary | ICD-10-CM | POA: Diagnosis not present

## 2015-02-17 DIAGNOSIS — N186 End stage renal disease: Secondary | ICD-10-CM | POA: Diagnosis not present

## 2015-02-17 DIAGNOSIS — N2581 Secondary hyperparathyroidism of renal origin: Secondary | ICD-10-CM | POA: Diagnosis not present

## 2015-02-18 ENCOUNTER — Ambulatory Visit
Admission: RE | Admit: 2015-02-18 | Discharge: 2015-02-18 | Disposition: A | Discharge status: Home / Self Care | Payer: Medicare Other | Source: Ambulatory Visit | Attending: Internal Medicine | Admitting: Internal Medicine

## 2015-02-18 DIAGNOSIS — N186 End stage renal disease: Secondary | ICD-10-CM

## 2015-02-18 DIAGNOSIS — E221 Hyperprolactinemia: Secondary | ICD-10-CM | POA: Diagnosis not present

## 2015-02-19 DIAGNOSIS — D631 Anemia in chronic kidney disease: Secondary | ICD-10-CM | POA: Diagnosis not present

## 2015-02-19 DIAGNOSIS — N2581 Secondary hyperparathyroidism of renal origin: Secondary | ICD-10-CM | POA: Diagnosis not present

## 2015-02-19 DIAGNOSIS — N186 End stage renal disease: Secondary | ICD-10-CM | POA: Diagnosis not present

## 2015-02-22 DIAGNOSIS — N186 End stage renal disease: Secondary | ICD-10-CM | POA: Diagnosis not present

## 2015-02-22 DIAGNOSIS — N2581 Secondary hyperparathyroidism of renal origin: Secondary | ICD-10-CM | POA: Diagnosis not present

## 2015-02-22 DIAGNOSIS — D631 Anemia in chronic kidney disease: Secondary | ICD-10-CM | POA: Diagnosis not present

## 2015-02-24 DIAGNOSIS — N2581 Secondary hyperparathyroidism of renal origin: Secondary | ICD-10-CM | POA: Diagnosis not present

## 2015-02-24 DIAGNOSIS — D631 Anemia in chronic kidney disease: Secondary | ICD-10-CM | POA: Diagnosis not present

## 2015-02-24 DIAGNOSIS — N186 End stage renal disease: Secondary | ICD-10-CM | POA: Diagnosis not present

## 2015-02-25 DIAGNOSIS — Z992 Dependence on renal dialysis: Secondary | ICD-10-CM | POA: Diagnosis not present

## 2015-02-25 DIAGNOSIS — N186 End stage renal disease: Secondary | ICD-10-CM | POA: Diagnosis not present

## 2015-02-25 DIAGNOSIS — I120 Hypertensive chronic kidney disease with stage 5 chronic kidney disease or end stage renal disease: Secondary | ICD-10-CM | POA: Diagnosis not present

## 2015-02-26 DIAGNOSIS — D631 Anemia in chronic kidney disease: Secondary | ICD-10-CM | POA: Diagnosis not present

## 2015-02-26 DIAGNOSIS — N186 End stage renal disease: Secondary | ICD-10-CM | POA: Diagnosis not present

## 2015-02-26 DIAGNOSIS — N2581 Secondary hyperparathyroidism of renal origin: Secondary | ICD-10-CM | POA: Diagnosis not present

## 2015-03-01 DIAGNOSIS — N186 End stage renal disease: Secondary | ICD-10-CM | POA: Diagnosis not present

## 2015-03-01 DIAGNOSIS — N2581 Secondary hyperparathyroidism of renal origin: Secondary | ICD-10-CM | POA: Diagnosis not present

## 2015-03-01 DIAGNOSIS — D631 Anemia in chronic kidney disease: Secondary | ICD-10-CM | POA: Diagnosis not present

## 2015-03-03 DIAGNOSIS — N186 End stage renal disease: Secondary | ICD-10-CM | POA: Diagnosis not present

## 2015-03-03 DIAGNOSIS — D631 Anemia in chronic kidney disease: Secondary | ICD-10-CM | POA: Diagnosis not present

## 2015-03-03 DIAGNOSIS — N2581 Secondary hyperparathyroidism of renal origin: Secondary | ICD-10-CM | POA: Diagnosis not present

## 2015-03-04 DIAGNOSIS — R768 Other specified abnormal immunological findings in serum: Secondary | ICD-10-CM | POA: Diagnosis not present

## 2015-03-05 DIAGNOSIS — N186 End stage renal disease: Secondary | ICD-10-CM | POA: Diagnosis not present

## 2015-03-05 DIAGNOSIS — N2581 Secondary hyperparathyroidism of renal origin: Secondary | ICD-10-CM | POA: Diagnosis not present

## 2015-03-05 DIAGNOSIS — D631 Anemia in chronic kidney disease: Secondary | ICD-10-CM | POA: Diagnosis not present

## 2015-03-08 DIAGNOSIS — N186 End stage renal disease: Secondary | ICD-10-CM | POA: Diagnosis not present

## 2015-03-08 DIAGNOSIS — N2581 Secondary hyperparathyroidism of renal origin: Secondary | ICD-10-CM | POA: Diagnosis not present

## 2015-03-08 DIAGNOSIS — D631 Anemia in chronic kidney disease: Secondary | ICD-10-CM | POA: Diagnosis not present

## 2015-03-09 DIAGNOSIS — N186 End stage renal disease: Secondary | ICD-10-CM | POA: Diagnosis not present

## 2015-03-09 DIAGNOSIS — E221 Hyperprolactinemia: Secondary | ICD-10-CM | POA: Diagnosis not present

## 2015-03-09 DIAGNOSIS — N6452 Nipple discharge: Secondary | ICD-10-CM | POA: Diagnosis not present

## 2015-03-09 DIAGNOSIS — E291 Testicular hypofunction: Secondary | ICD-10-CM | POA: Diagnosis not present

## 2015-03-10 DIAGNOSIS — N2581 Secondary hyperparathyroidism of renal origin: Secondary | ICD-10-CM | POA: Diagnosis not present

## 2015-03-10 DIAGNOSIS — N186 End stage renal disease: Secondary | ICD-10-CM | POA: Diagnosis not present

## 2015-03-10 DIAGNOSIS — D631 Anemia in chronic kidney disease: Secondary | ICD-10-CM | POA: Diagnosis not present

## 2015-03-11 DIAGNOSIS — Z1389 Encounter for screening for other disorder: Secondary | ICD-10-CM | POA: Diagnosis not present

## 2015-03-11 DIAGNOSIS — J209 Acute bronchitis, unspecified: Secondary | ICD-10-CM | POA: Diagnosis not present

## 2015-03-11 DIAGNOSIS — Z6833 Body mass index (BMI) 33.0-33.9, adult: Secondary | ICD-10-CM | POA: Diagnosis not present

## 2015-03-12 DIAGNOSIS — D631 Anemia in chronic kidney disease: Secondary | ICD-10-CM | POA: Diagnosis not present

## 2015-03-12 DIAGNOSIS — N186 End stage renal disease: Secondary | ICD-10-CM | POA: Diagnosis not present

## 2015-03-12 DIAGNOSIS — N2581 Secondary hyperparathyroidism of renal origin: Secondary | ICD-10-CM | POA: Diagnosis not present

## 2015-03-15 DIAGNOSIS — N2581 Secondary hyperparathyroidism of renal origin: Secondary | ICD-10-CM | POA: Diagnosis not present

## 2015-03-15 DIAGNOSIS — D631 Anemia in chronic kidney disease: Secondary | ICD-10-CM | POA: Diagnosis not present

## 2015-03-15 DIAGNOSIS — N186 End stage renal disease: Secondary | ICD-10-CM | POA: Diagnosis not present

## 2015-03-17 DIAGNOSIS — D631 Anemia in chronic kidney disease: Secondary | ICD-10-CM | POA: Diagnosis not present

## 2015-03-17 DIAGNOSIS — N186 End stage renal disease: Secondary | ICD-10-CM | POA: Diagnosis not present

## 2015-03-17 DIAGNOSIS — N2581 Secondary hyperparathyroidism of renal origin: Secondary | ICD-10-CM | POA: Diagnosis not present

## 2015-03-19 DIAGNOSIS — N2581 Secondary hyperparathyroidism of renal origin: Secondary | ICD-10-CM | POA: Diagnosis not present

## 2015-03-19 DIAGNOSIS — D631 Anemia in chronic kidney disease: Secondary | ICD-10-CM | POA: Diagnosis not present

## 2015-03-19 DIAGNOSIS — N186 End stage renal disease: Secondary | ICD-10-CM | POA: Diagnosis not present

## 2015-03-22 DIAGNOSIS — N186 End stage renal disease: Secondary | ICD-10-CM | POA: Diagnosis not present

## 2015-03-22 DIAGNOSIS — N2581 Secondary hyperparathyroidism of renal origin: Secondary | ICD-10-CM | POA: Diagnosis not present

## 2015-03-22 DIAGNOSIS — D631 Anemia in chronic kidney disease: Secondary | ICD-10-CM | POA: Diagnosis not present

## 2015-03-24 DIAGNOSIS — D631 Anemia in chronic kidney disease: Secondary | ICD-10-CM | POA: Diagnosis not present

## 2015-03-24 DIAGNOSIS — N2581 Secondary hyperparathyroidism of renal origin: Secondary | ICD-10-CM | POA: Diagnosis not present

## 2015-03-24 DIAGNOSIS — N186 End stage renal disease: Secondary | ICD-10-CM | POA: Diagnosis not present

## 2015-03-26 DIAGNOSIS — N186 End stage renal disease: Secondary | ICD-10-CM | POA: Diagnosis not present

## 2015-03-26 DIAGNOSIS — D631 Anemia in chronic kidney disease: Secondary | ICD-10-CM | POA: Diagnosis not present

## 2015-03-26 DIAGNOSIS — N2581 Secondary hyperparathyroidism of renal origin: Secondary | ICD-10-CM | POA: Diagnosis not present

## 2015-03-28 DIAGNOSIS — N186 End stage renal disease: Secondary | ICD-10-CM | POA: Diagnosis not present

## 2015-03-28 DIAGNOSIS — Z992 Dependence on renal dialysis: Secondary | ICD-10-CM | POA: Diagnosis not present

## 2015-03-28 DIAGNOSIS — I120 Hypertensive chronic kidney disease with stage 5 chronic kidney disease or end stage renal disease: Secondary | ICD-10-CM | POA: Diagnosis not present

## 2015-03-29 DIAGNOSIS — N2581 Secondary hyperparathyroidism of renal origin: Secondary | ICD-10-CM | POA: Diagnosis not present

## 2015-03-29 DIAGNOSIS — N186 End stage renal disease: Secondary | ICD-10-CM | POA: Diagnosis not present

## 2015-03-29 DIAGNOSIS — D631 Anemia in chronic kidney disease: Secondary | ICD-10-CM | POA: Diagnosis not present

## 2015-03-29 DIAGNOSIS — D509 Iron deficiency anemia, unspecified: Secondary | ICD-10-CM | POA: Diagnosis not present

## 2015-03-31 DIAGNOSIS — D509 Iron deficiency anemia, unspecified: Secondary | ICD-10-CM | POA: Diagnosis not present

## 2015-03-31 DIAGNOSIS — N2581 Secondary hyperparathyroidism of renal origin: Secondary | ICD-10-CM | POA: Diagnosis not present

## 2015-03-31 DIAGNOSIS — N186 End stage renal disease: Secondary | ICD-10-CM | POA: Diagnosis not present

## 2015-03-31 DIAGNOSIS — D631 Anemia in chronic kidney disease: Secondary | ICD-10-CM | POA: Diagnosis not present

## 2015-04-02 DIAGNOSIS — N186 End stage renal disease: Secondary | ICD-10-CM | POA: Diagnosis not present

## 2015-04-02 DIAGNOSIS — D631 Anemia in chronic kidney disease: Secondary | ICD-10-CM | POA: Diagnosis not present

## 2015-04-02 DIAGNOSIS — N2581 Secondary hyperparathyroidism of renal origin: Secondary | ICD-10-CM | POA: Diagnosis not present

## 2015-04-02 DIAGNOSIS — D509 Iron deficiency anemia, unspecified: Secondary | ICD-10-CM | POA: Diagnosis not present

## 2015-04-05 DIAGNOSIS — N2581 Secondary hyperparathyroidism of renal origin: Secondary | ICD-10-CM | POA: Diagnosis not present

## 2015-04-05 DIAGNOSIS — D631 Anemia in chronic kidney disease: Secondary | ICD-10-CM | POA: Diagnosis not present

## 2015-04-05 DIAGNOSIS — D509 Iron deficiency anemia, unspecified: Secondary | ICD-10-CM | POA: Diagnosis not present

## 2015-04-05 DIAGNOSIS — N186 End stage renal disease: Secondary | ICD-10-CM | POA: Diagnosis not present

## 2015-04-06 DIAGNOSIS — E221 Hyperprolactinemia: Secondary | ICD-10-CM | POA: Diagnosis not present

## 2015-04-07 DIAGNOSIS — D631 Anemia in chronic kidney disease: Secondary | ICD-10-CM | POA: Diagnosis not present

## 2015-04-07 DIAGNOSIS — N186 End stage renal disease: Secondary | ICD-10-CM | POA: Diagnosis not present

## 2015-04-07 DIAGNOSIS — D509 Iron deficiency anemia, unspecified: Secondary | ICD-10-CM | POA: Diagnosis not present

## 2015-04-07 DIAGNOSIS — N2581 Secondary hyperparathyroidism of renal origin: Secondary | ICD-10-CM | POA: Diagnosis not present

## 2015-04-09 DIAGNOSIS — N186 End stage renal disease: Secondary | ICD-10-CM | POA: Diagnosis not present

## 2015-04-09 DIAGNOSIS — D631 Anemia in chronic kidney disease: Secondary | ICD-10-CM | POA: Diagnosis not present

## 2015-04-09 DIAGNOSIS — D509 Iron deficiency anemia, unspecified: Secondary | ICD-10-CM | POA: Diagnosis not present

## 2015-04-09 DIAGNOSIS — N2581 Secondary hyperparathyroidism of renal origin: Secondary | ICD-10-CM | POA: Diagnosis not present

## 2015-04-12 DIAGNOSIS — D509 Iron deficiency anemia, unspecified: Secondary | ICD-10-CM | POA: Diagnosis not present

## 2015-04-12 DIAGNOSIS — D631 Anemia in chronic kidney disease: Secondary | ICD-10-CM | POA: Diagnosis not present

## 2015-04-12 DIAGNOSIS — N186 End stage renal disease: Secondary | ICD-10-CM | POA: Diagnosis not present

## 2015-04-12 DIAGNOSIS — N2581 Secondary hyperparathyroidism of renal origin: Secondary | ICD-10-CM | POA: Diagnosis not present

## 2015-04-14 DIAGNOSIS — N2581 Secondary hyperparathyroidism of renal origin: Secondary | ICD-10-CM | POA: Diagnosis not present

## 2015-04-14 DIAGNOSIS — N186 End stage renal disease: Secondary | ICD-10-CM | POA: Diagnosis not present

## 2015-04-14 DIAGNOSIS — D509 Iron deficiency anemia, unspecified: Secondary | ICD-10-CM | POA: Diagnosis not present

## 2015-04-14 DIAGNOSIS — D631 Anemia in chronic kidney disease: Secondary | ICD-10-CM | POA: Diagnosis not present

## 2015-04-16 DIAGNOSIS — D509 Iron deficiency anemia, unspecified: Secondary | ICD-10-CM | POA: Diagnosis not present

## 2015-04-16 DIAGNOSIS — D631 Anemia in chronic kidney disease: Secondary | ICD-10-CM | POA: Diagnosis not present

## 2015-04-16 DIAGNOSIS — N186 End stage renal disease: Secondary | ICD-10-CM | POA: Diagnosis not present

## 2015-04-16 DIAGNOSIS — N2581 Secondary hyperparathyroidism of renal origin: Secondary | ICD-10-CM | POA: Diagnosis not present

## 2015-04-19 DIAGNOSIS — D631 Anemia in chronic kidney disease: Secondary | ICD-10-CM | POA: Diagnosis not present

## 2015-04-19 DIAGNOSIS — N186 End stage renal disease: Secondary | ICD-10-CM | POA: Diagnosis not present

## 2015-04-19 DIAGNOSIS — N2581 Secondary hyperparathyroidism of renal origin: Secondary | ICD-10-CM | POA: Diagnosis not present

## 2015-04-19 DIAGNOSIS — D509 Iron deficiency anemia, unspecified: Secondary | ICD-10-CM | POA: Diagnosis not present

## 2015-04-21 DIAGNOSIS — D509 Iron deficiency anemia, unspecified: Secondary | ICD-10-CM | POA: Diagnosis not present

## 2015-04-21 DIAGNOSIS — N186 End stage renal disease: Secondary | ICD-10-CM | POA: Diagnosis not present

## 2015-04-21 DIAGNOSIS — N2581 Secondary hyperparathyroidism of renal origin: Secondary | ICD-10-CM | POA: Diagnosis not present

## 2015-04-21 DIAGNOSIS — D631 Anemia in chronic kidney disease: Secondary | ICD-10-CM | POA: Diagnosis not present

## 2015-04-23 DIAGNOSIS — N2581 Secondary hyperparathyroidism of renal origin: Secondary | ICD-10-CM | POA: Diagnosis not present

## 2015-04-23 DIAGNOSIS — N186 End stage renal disease: Secondary | ICD-10-CM | POA: Diagnosis not present

## 2015-04-23 DIAGNOSIS — D631 Anemia in chronic kidney disease: Secondary | ICD-10-CM | POA: Diagnosis not present

## 2015-04-23 DIAGNOSIS — D509 Iron deficiency anemia, unspecified: Secondary | ICD-10-CM | POA: Diagnosis not present

## 2015-04-26 DIAGNOSIS — D509 Iron deficiency anemia, unspecified: Secondary | ICD-10-CM | POA: Diagnosis not present

## 2015-04-26 DIAGNOSIS — D631 Anemia in chronic kidney disease: Secondary | ICD-10-CM | POA: Diagnosis not present

## 2015-04-26 DIAGNOSIS — N2581 Secondary hyperparathyroidism of renal origin: Secondary | ICD-10-CM | POA: Diagnosis not present

## 2015-04-26 DIAGNOSIS — N186 End stage renal disease: Secondary | ICD-10-CM | POA: Diagnosis not present

## 2015-04-28 DIAGNOSIS — D631 Anemia in chronic kidney disease: Secondary | ICD-10-CM | POA: Diagnosis not present

## 2015-04-28 DIAGNOSIS — D509 Iron deficiency anemia, unspecified: Secondary | ICD-10-CM | POA: Diagnosis not present

## 2015-04-28 DIAGNOSIS — N186 End stage renal disease: Secondary | ICD-10-CM | POA: Diagnosis not present

## 2015-04-28 DIAGNOSIS — I120 Hypertensive chronic kidney disease with stage 5 chronic kidney disease or end stage renal disease: Secondary | ICD-10-CM | POA: Diagnosis not present

## 2015-04-28 DIAGNOSIS — Z992 Dependence on renal dialysis: Secondary | ICD-10-CM | POA: Diagnosis not present

## 2015-04-28 DIAGNOSIS — N2581 Secondary hyperparathyroidism of renal origin: Secondary | ICD-10-CM | POA: Diagnosis not present

## 2015-04-30 DIAGNOSIS — N2581 Secondary hyperparathyroidism of renal origin: Secondary | ICD-10-CM | POA: Diagnosis not present

## 2015-04-30 DIAGNOSIS — N186 End stage renal disease: Secondary | ICD-10-CM | POA: Diagnosis not present

## 2015-04-30 DIAGNOSIS — D631 Anemia in chronic kidney disease: Secondary | ICD-10-CM | POA: Diagnosis not present

## 2015-04-30 DIAGNOSIS — Z23 Encounter for immunization: Secondary | ICD-10-CM | POA: Diagnosis not present

## 2015-04-30 DIAGNOSIS — D509 Iron deficiency anemia, unspecified: Secondary | ICD-10-CM | POA: Diagnosis not present

## 2015-05-03 DIAGNOSIS — N2581 Secondary hyperparathyroidism of renal origin: Secondary | ICD-10-CM | POA: Diagnosis not present

## 2015-05-03 DIAGNOSIS — D509 Iron deficiency anemia, unspecified: Secondary | ICD-10-CM | POA: Diagnosis not present

## 2015-05-03 DIAGNOSIS — D631 Anemia in chronic kidney disease: Secondary | ICD-10-CM | POA: Diagnosis not present

## 2015-05-03 DIAGNOSIS — Z23 Encounter for immunization: Secondary | ICD-10-CM | POA: Diagnosis not present

## 2015-05-03 DIAGNOSIS — N186 End stage renal disease: Secondary | ICD-10-CM | POA: Diagnosis not present

## 2015-05-05 DIAGNOSIS — Z23 Encounter for immunization: Secondary | ICD-10-CM | POA: Diagnosis not present

## 2015-05-05 DIAGNOSIS — N2581 Secondary hyperparathyroidism of renal origin: Secondary | ICD-10-CM | POA: Diagnosis not present

## 2015-05-05 DIAGNOSIS — N186 End stage renal disease: Secondary | ICD-10-CM | POA: Diagnosis not present

## 2015-05-05 DIAGNOSIS — D631 Anemia in chronic kidney disease: Secondary | ICD-10-CM | POA: Diagnosis not present

## 2015-05-05 DIAGNOSIS — D509 Iron deficiency anemia, unspecified: Secondary | ICD-10-CM | POA: Diagnosis not present

## 2015-05-07 DIAGNOSIS — N186 End stage renal disease: Secondary | ICD-10-CM | POA: Diagnosis not present

## 2015-05-07 DIAGNOSIS — Z23 Encounter for immunization: Secondary | ICD-10-CM | POA: Diagnosis not present

## 2015-05-07 DIAGNOSIS — N2581 Secondary hyperparathyroidism of renal origin: Secondary | ICD-10-CM | POA: Diagnosis not present

## 2015-05-07 DIAGNOSIS — D631 Anemia in chronic kidney disease: Secondary | ICD-10-CM | POA: Diagnosis not present

## 2015-05-07 DIAGNOSIS — D509 Iron deficiency anemia, unspecified: Secondary | ICD-10-CM | POA: Diagnosis not present

## 2015-05-10 DIAGNOSIS — D631 Anemia in chronic kidney disease: Secondary | ICD-10-CM | POA: Diagnosis not present

## 2015-05-10 DIAGNOSIS — N186 End stage renal disease: Secondary | ICD-10-CM | POA: Diagnosis not present

## 2015-05-10 DIAGNOSIS — Z23 Encounter for immunization: Secondary | ICD-10-CM | POA: Diagnosis not present

## 2015-05-10 DIAGNOSIS — N2581 Secondary hyperparathyroidism of renal origin: Secondary | ICD-10-CM | POA: Diagnosis not present

## 2015-05-10 DIAGNOSIS — D509 Iron deficiency anemia, unspecified: Secondary | ICD-10-CM | POA: Diagnosis not present

## 2015-05-12 DIAGNOSIS — D631 Anemia in chronic kidney disease: Secondary | ICD-10-CM | POA: Diagnosis not present

## 2015-05-12 DIAGNOSIS — D509 Iron deficiency anemia, unspecified: Secondary | ICD-10-CM | POA: Diagnosis not present

## 2015-05-12 DIAGNOSIS — N186 End stage renal disease: Secondary | ICD-10-CM | POA: Diagnosis not present

## 2015-05-12 DIAGNOSIS — N2581 Secondary hyperparathyroidism of renal origin: Secondary | ICD-10-CM | POA: Diagnosis not present

## 2015-05-12 DIAGNOSIS — Z23 Encounter for immunization: Secondary | ICD-10-CM | POA: Diagnosis not present

## 2015-05-14 DIAGNOSIS — D509 Iron deficiency anemia, unspecified: Secondary | ICD-10-CM | POA: Diagnosis not present

## 2015-05-14 DIAGNOSIS — Z23 Encounter for immunization: Secondary | ICD-10-CM | POA: Diagnosis not present

## 2015-05-14 DIAGNOSIS — D631 Anemia in chronic kidney disease: Secondary | ICD-10-CM | POA: Diagnosis not present

## 2015-05-14 DIAGNOSIS — N186 End stage renal disease: Secondary | ICD-10-CM | POA: Diagnosis not present

## 2015-05-14 DIAGNOSIS — N2581 Secondary hyperparathyroidism of renal origin: Secondary | ICD-10-CM | POA: Diagnosis not present

## 2015-05-17 DIAGNOSIS — D631 Anemia in chronic kidney disease: Secondary | ICD-10-CM | POA: Diagnosis not present

## 2015-05-17 DIAGNOSIS — N2581 Secondary hyperparathyroidism of renal origin: Secondary | ICD-10-CM | POA: Diagnosis not present

## 2015-05-17 DIAGNOSIS — Z23 Encounter for immunization: Secondary | ICD-10-CM | POA: Diagnosis not present

## 2015-05-17 DIAGNOSIS — D509 Iron deficiency anemia, unspecified: Secondary | ICD-10-CM | POA: Diagnosis not present

## 2015-05-17 DIAGNOSIS — N186 End stage renal disease: Secondary | ICD-10-CM | POA: Diagnosis not present

## 2015-05-19 DIAGNOSIS — N186 End stage renal disease: Secondary | ICD-10-CM | POA: Diagnosis not present

## 2015-05-19 DIAGNOSIS — D631 Anemia in chronic kidney disease: Secondary | ICD-10-CM | POA: Diagnosis not present

## 2015-05-19 DIAGNOSIS — D509 Iron deficiency anemia, unspecified: Secondary | ICD-10-CM | POA: Diagnosis not present

## 2015-05-19 DIAGNOSIS — N2581 Secondary hyperparathyroidism of renal origin: Secondary | ICD-10-CM | POA: Diagnosis not present

## 2015-05-19 DIAGNOSIS — Z23 Encounter for immunization: Secondary | ICD-10-CM | POA: Diagnosis not present

## 2015-05-21 DIAGNOSIS — D509 Iron deficiency anemia, unspecified: Secondary | ICD-10-CM | POA: Diagnosis not present

## 2015-05-21 DIAGNOSIS — D631 Anemia in chronic kidney disease: Secondary | ICD-10-CM | POA: Diagnosis not present

## 2015-05-21 DIAGNOSIS — N186 End stage renal disease: Secondary | ICD-10-CM | POA: Diagnosis not present

## 2015-05-21 DIAGNOSIS — Z23 Encounter for immunization: Secondary | ICD-10-CM | POA: Diagnosis not present

## 2015-05-21 DIAGNOSIS — N2581 Secondary hyperparathyroidism of renal origin: Secondary | ICD-10-CM | POA: Diagnosis not present

## 2015-05-24 DIAGNOSIS — N2581 Secondary hyperparathyroidism of renal origin: Secondary | ICD-10-CM | POA: Diagnosis not present

## 2015-05-24 DIAGNOSIS — D509 Iron deficiency anemia, unspecified: Secondary | ICD-10-CM | POA: Diagnosis not present

## 2015-05-24 DIAGNOSIS — D631 Anemia in chronic kidney disease: Secondary | ICD-10-CM | POA: Diagnosis not present

## 2015-05-24 DIAGNOSIS — Z23 Encounter for immunization: Secondary | ICD-10-CM | POA: Diagnosis not present

## 2015-05-24 DIAGNOSIS — N186 End stage renal disease: Secondary | ICD-10-CM | POA: Diagnosis not present

## 2015-05-26 DIAGNOSIS — N2581 Secondary hyperparathyroidism of renal origin: Secondary | ICD-10-CM | POA: Diagnosis not present

## 2015-05-26 DIAGNOSIS — D509 Iron deficiency anemia, unspecified: Secondary | ICD-10-CM | POA: Diagnosis not present

## 2015-05-26 DIAGNOSIS — N186 End stage renal disease: Secondary | ICD-10-CM | POA: Diagnosis not present

## 2015-05-26 DIAGNOSIS — D631 Anemia in chronic kidney disease: Secondary | ICD-10-CM | POA: Diagnosis not present

## 2015-05-26 DIAGNOSIS — Z23 Encounter for immunization: Secondary | ICD-10-CM | POA: Diagnosis not present

## 2015-05-28 DIAGNOSIS — Z23 Encounter for immunization: Secondary | ICD-10-CM | POA: Diagnosis not present

## 2015-05-28 DIAGNOSIS — N2581 Secondary hyperparathyroidism of renal origin: Secondary | ICD-10-CM | POA: Diagnosis not present

## 2015-05-28 DIAGNOSIS — D631 Anemia in chronic kidney disease: Secondary | ICD-10-CM | POA: Diagnosis not present

## 2015-05-28 DIAGNOSIS — Z992 Dependence on renal dialysis: Secondary | ICD-10-CM | POA: Diagnosis not present

## 2015-05-28 DIAGNOSIS — I120 Hypertensive chronic kidney disease with stage 5 chronic kidney disease or end stage renal disease: Secondary | ICD-10-CM | POA: Diagnosis not present

## 2015-05-28 DIAGNOSIS — N186 End stage renal disease: Secondary | ICD-10-CM | POA: Diagnosis not present

## 2015-05-28 DIAGNOSIS — D509 Iron deficiency anemia, unspecified: Secondary | ICD-10-CM | POA: Diagnosis not present

## 2015-05-31 DIAGNOSIS — D631 Anemia in chronic kidney disease: Secondary | ICD-10-CM | POA: Diagnosis not present

## 2015-05-31 DIAGNOSIS — N2581 Secondary hyperparathyroidism of renal origin: Secondary | ICD-10-CM | POA: Diagnosis not present

## 2015-05-31 DIAGNOSIS — N186 End stage renal disease: Secondary | ICD-10-CM | POA: Diagnosis not present

## 2015-05-31 DIAGNOSIS — D509 Iron deficiency anemia, unspecified: Secondary | ICD-10-CM | POA: Diagnosis not present

## 2015-06-02 DIAGNOSIS — D509 Iron deficiency anemia, unspecified: Secondary | ICD-10-CM | POA: Diagnosis not present

## 2015-06-02 DIAGNOSIS — N186 End stage renal disease: Secondary | ICD-10-CM | POA: Diagnosis not present

## 2015-06-02 DIAGNOSIS — N2581 Secondary hyperparathyroidism of renal origin: Secondary | ICD-10-CM | POA: Diagnosis not present

## 2015-06-02 DIAGNOSIS — D631 Anemia in chronic kidney disease: Secondary | ICD-10-CM | POA: Diagnosis not present

## 2015-06-04 DIAGNOSIS — D631 Anemia in chronic kidney disease: Secondary | ICD-10-CM | POA: Diagnosis not present

## 2015-06-04 DIAGNOSIS — D509 Iron deficiency anemia, unspecified: Secondary | ICD-10-CM | POA: Diagnosis not present

## 2015-06-04 DIAGNOSIS — N2581 Secondary hyperparathyroidism of renal origin: Secondary | ICD-10-CM | POA: Diagnosis not present

## 2015-06-04 DIAGNOSIS — N186 End stage renal disease: Secondary | ICD-10-CM | POA: Diagnosis not present

## 2015-06-07 DIAGNOSIS — N2581 Secondary hyperparathyroidism of renal origin: Secondary | ICD-10-CM | POA: Diagnosis not present

## 2015-06-07 DIAGNOSIS — D509 Iron deficiency anemia, unspecified: Secondary | ICD-10-CM | POA: Diagnosis not present

## 2015-06-07 DIAGNOSIS — D631 Anemia in chronic kidney disease: Secondary | ICD-10-CM | POA: Diagnosis not present

## 2015-06-07 DIAGNOSIS — N186 End stage renal disease: Secondary | ICD-10-CM | POA: Diagnosis not present

## 2015-06-09 DIAGNOSIS — N2581 Secondary hyperparathyroidism of renal origin: Secondary | ICD-10-CM | POA: Diagnosis not present

## 2015-06-09 DIAGNOSIS — D509 Iron deficiency anemia, unspecified: Secondary | ICD-10-CM | POA: Diagnosis not present

## 2015-06-09 DIAGNOSIS — D631 Anemia in chronic kidney disease: Secondary | ICD-10-CM | POA: Diagnosis not present

## 2015-06-09 DIAGNOSIS — N186 End stage renal disease: Secondary | ICD-10-CM | POA: Diagnosis not present

## 2015-06-11 DIAGNOSIS — N2581 Secondary hyperparathyroidism of renal origin: Secondary | ICD-10-CM | POA: Diagnosis not present

## 2015-06-11 DIAGNOSIS — D631 Anemia in chronic kidney disease: Secondary | ICD-10-CM | POA: Diagnosis not present

## 2015-06-11 DIAGNOSIS — D509 Iron deficiency anemia, unspecified: Secondary | ICD-10-CM | POA: Diagnosis not present

## 2015-06-11 DIAGNOSIS — N186 End stage renal disease: Secondary | ICD-10-CM | POA: Diagnosis not present

## 2015-06-14 DIAGNOSIS — N2581 Secondary hyperparathyroidism of renal origin: Secondary | ICD-10-CM | POA: Diagnosis not present

## 2015-06-14 DIAGNOSIS — D631 Anemia in chronic kidney disease: Secondary | ICD-10-CM | POA: Diagnosis not present

## 2015-06-14 DIAGNOSIS — N186 End stage renal disease: Secondary | ICD-10-CM | POA: Diagnosis not present

## 2015-06-14 DIAGNOSIS — D509 Iron deficiency anemia, unspecified: Secondary | ICD-10-CM | POA: Diagnosis not present

## 2015-06-16 DIAGNOSIS — N2581 Secondary hyperparathyroidism of renal origin: Secondary | ICD-10-CM | POA: Diagnosis not present

## 2015-06-16 DIAGNOSIS — N186 End stage renal disease: Secondary | ICD-10-CM | POA: Diagnosis not present

## 2015-06-16 DIAGNOSIS — D631 Anemia in chronic kidney disease: Secondary | ICD-10-CM | POA: Diagnosis not present

## 2015-06-16 DIAGNOSIS — D509 Iron deficiency anemia, unspecified: Secondary | ICD-10-CM | POA: Diagnosis not present

## 2015-06-18 DIAGNOSIS — N2581 Secondary hyperparathyroidism of renal origin: Secondary | ICD-10-CM | POA: Diagnosis not present

## 2015-06-18 DIAGNOSIS — D509 Iron deficiency anemia, unspecified: Secondary | ICD-10-CM | POA: Diagnosis not present

## 2015-06-18 DIAGNOSIS — D631 Anemia in chronic kidney disease: Secondary | ICD-10-CM | POA: Diagnosis not present

## 2015-06-18 DIAGNOSIS — N186 End stage renal disease: Secondary | ICD-10-CM | POA: Diagnosis not present

## 2015-06-21 DIAGNOSIS — D631 Anemia in chronic kidney disease: Secondary | ICD-10-CM | POA: Diagnosis not present

## 2015-06-21 DIAGNOSIS — N186 End stage renal disease: Secondary | ICD-10-CM | POA: Diagnosis not present

## 2015-06-21 DIAGNOSIS — E221 Hyperprolactinemia: Secondary | ICD-10-CM | POA: Diagnosis not present

## 2015-06-21 DIAGNOSIS — N2581 Secondary hyperparathyroidism of renal origin: Secondary | ICD-10-CM | POA: Diagnosis not present

## 2015-06-21 DIAGNOSIS — D509 Iron deficiency anemia, unspecified: Secondary | ICD-10-CM | POA: Diagnosis not present

## 2015-06-23 DIAGNOSIS — N2581 Secondary hyperparathyroidism of renal origin: Secondary | ICD-10-CM | POA: Diagnosis not present

## 2015-06-23 DIAGNOSIS — D631 Anemia in chronic kidney disease: Secondary | ICD-10-CM | POA: Diagnosis not present

## 2015-06-23 DIAGNOSIS — D509 Iron deficiency anemia, unspecified: Secondary | ICD-10-CM | POA: Diagnosis not present

## 2015-06-23 DIAGNOSIS — N186 End stage renal disease: Secondary | ICD-10-CM | POA: Diagnosis not present

## 2015-06-25 DIAGNOSIS — D509 Iron deficiency anemia, unspecified: Secondary | ICD-10-CM | POA: Diagnosis not present

## 2015-06-25 DIAGNOSIS — D631 Anemia in chronic kidney disease: Secondary | ICD-10-CM | POA: Diagnosis not present

## 2015-06-25 DIAGNOSIS — N186 End stage renal disease: Secondary | ICD-10-CM | POA: Diagnosis not present

## 2015-06-25 DIAGNOSIS — N2581 Secondary hyperparathyroidism of renal origin: Secondary | ICD-10-CM | POA: Diagnosis not present

## 2015-06-28 DIAGNOSIS — N186 End stage renal disease: Secondary | ICD-10-CM | POA: Diagnosis not present

## 2015-06-28 DIAGNOSIS — D509 Iron deficiency anemia, unspecified: Secondary | ICD-10-CM | POA: Diagnosis not present

## 2015-06-28 DIAGNOSIS — Z992 Dependence on renal dialysis: Secondary | ICD-10-CM | POA: Diagnosis not present

## 2015-06-28 DIAGNOSIS — D631 Anemia in chronic kidney disease: Secondary | ICD-10-CM | POA: Diagnosis not present

## 2015-06-28 DIAGNOSIS — N2581 Secondary hyperparathyroidism of renal origin: Secondary | ICD-10-CM | POA: Diagnosis not present

## 2015-06-28 DIAGNOSIS — I120 Hypertensive chronic kidney disease with stage 5 chronic kidney disease or end stage renal disease: Secondary | ICD-10-CM | POA: Diagnosis not present

## 2015-06-30 DIAGNOSIS — D509 Iron deficiency anemia, unspecified: Secondary | ICD-10-CM | POA: Diagnosis not present

## 2015-06-30 DIAGNOSIS — N2581 Secondary hyperparathyroidism of renal origin: Secondary | ICD-10-CM | POA: Diagnosis not present

## 2015-06-30 DIAGNOSIS — D631 Anemia in chronic kidney disease: Secondary | ICD-10-CM | POA: Diagnosis not present

## 2015-06-30 DIAGNOSIS — N186 End stage renal disease: Secondary | ICD-10-CM | POA: Diagnosis not present

## 2015-07-02 DIAGNOSIS — D509 Iron deficiency anemia, unspecified: Secondary | ICD-10-CM | POA: Diagnosis not present

## 2015-07-02 DIAGNOSIS — N186 End stage renal disease: Secondary | ICD-10-CM | POA: Diagnosis not present

## 2015-07-02 DIAGNOSIS — N2581 Secondary hyperparathyroidism of renal origin: Secondary | ICD-10-CM | POA: Diagnosis not present

## 2015-07-02 DIAGNOSIS — D631 Anemia in chronic kidney disease: Secondary | ICD-10-CM | POA: Diagnosis not present

## 2015-07-05 DIAGNOSIS — D509 Iron deficiency anemia, unspecified: Secondary | ICD-10-CM | POA: Diagnosis not present

## 2015-07-05 DIAGNOSIS — D631 Anemia in chronic kidney disease: Secondary | ICD-10-CM | POA: Diagnosis not present

## 2015-07-05 DIAGNOSIS — N2581 Secondary hyperparathyroidism of renal origin: Secondary | ICD-10-CM | POA: Diagnosis not present

## 2015-07-05 DIAGNOSIS — N186 End stage renal disease: Secondary | ICD-10-CM | POA: Diagnosis not present

## 2015-07-06 DIAGNOSIS — N186 End stage renal disease: Secondary | ICD-10-CM | POA: Diagnosis not present

## 2015-07-06 DIAGNOSIS — E221 Hyperprolactinemia: Secondary | ICD-10-CM | POA: Diagnosis not present

## 2015-07-06 DIAGNOSIS — E291 Testicular hypofunction: Secondary | ICD-10-CM | POA: Diagnosis not present

## 2015-07-07 DIAGNOSIS — N186 End stage renal disease: Secondary | ICD-10-CM | POA: Diagnosis not present

## 2015-07-07 DIAGNOSIS — D631 Anemia in chronic kidney disease: Secondary | ICD-10-CM | POA: Diagnosis not present

## 2015-07-07 DIAGNOSIS — D509 Iron deficiency anemia, unspecified: Secondary | ICD-10-CM | POA: Diagnosis not present

## 2015-07-07 DIAGNOSIS — N2581 Secondary hyperparathyroidism of renal origin: Secondary | ICD-10-CM | POA: Diagnosis not present

## 2015-07-09 DIAGNOSIS — D631 Anemia in chronic kidney disease: Secondary | ICD-10-CM | POA: Diagnosis not present

## 2015-07-09 DIAGNOSIS — N2581 Secondary hyperparathyroidism of renal origin: Secondary | ICD-10-CM | POA: Diagnosis not present

## 2015-07-09 DIAGNOSIS — D509 Iron deficiency anemia, unspecified: Secondary | ICD-10-CM | POA: Diagnosis not present

## 2015-07-09 DIAGNOSIS — N186 End stage renal disease: Secondary | ICD-10-CM | POA: Diagnosis not present

## 2015-07-12 DIAGNOSIS — N2581 Secondary hyperparathyroidism of renal origin: Secondary | ICD-10-CM | POA: Diagnosis not present

## 2015-07-12 DIAGNOSIS — N186 End stage renal disease: Secondary | ICD-10-CM | POA: Diagnosis not present

## 2015-07-12 DIAGNOSIS — D631 Anemia in chronic kidney disease: Secondary | ICD-10-CM | POA: Diagnosis not present

## 2015-07-12 DIAGNOSIS — D509 Iron deficiency anemia, unspecified: Secondary | ICD-10-CM | POA: Diagnosis not present

## 2015-07-14 DIAGNOSIS — D631 Anemia in chronic kidney disease: Secondary | ICD-10-CM | POA: Diagnosis not present

## 2015-07-14 DIAGNOSIS — N2581 Secondary hyperparathyroidism of renal origin: Secondary | ICD-10-CM | POA: Diagnosis not present

## 2015-07-14 DIAGNOSIS — D509 Iron deficiency anemia, unspecified: Secondary | ICD-10-CM | POA: Diagnosis not present

## 2015-07-14 DIAGNOSIS — N186 End stage renal disease: Secondary | ICD-10-CM | POA: Diagnosis not present

## 2015-07-16 DIAGNOSIS — N186 End stage renal disease: Secondary | ICD-10-CM | POA: Diagnosis not present

## 2015-07-16 DIAGNOSIS — N2581 Secondary hyperparathyroidism of renal origin: Secondary | ICD-10-CM | POA: Diagnosis not present

## 2015-07-16 DIAGNOSIS — D631 Anemia in chronic kidney disease: Secondary | ICD-10-CM | POA: Diagnosis not present

## 2015-07-16 DIAGNOSIS — D509 Iron deficiency anemia, unspecified: Secondary | ICD-10-CM | POA: Diagnosis not present

## 2015-07-19 DIAGNOSIS — N186 End stage renal disease: Secondary | ICD-10-CM | POA: Diagnosis not present

## 2015-07-19 DIAGNOSIS — D509 Iron deficiency anemia, unspecified: Secondary | ICD-10-CM | POA: Diagnosis not present

## 2015-07-19 DIAGNOSIS — D631 Anemia in chronic kidney disease: Secondary | ICD-10-CM | POA: Diagnosis not present

## 2015-07-19 DIAGNOSIS — N2581 Secondary hyperparathyroidism of renal origin: Secondary | ICD-10-CM | POA: Diagnosis not present

## 2015-07-20 DIAGNOSIS — E221 Hyperprolactinemia: Secondary | ICD-10-CM | POA: Diagnosis not present

## 2015-07-21 DIAGNOSIS — N2581 Secondary hyperparathyroidism of renal origin: Secondary | ICD-10-CM | POA: Diagnosis not present

## 2015-07-21 DIAGNOSIS — N186 End stage renal disease: Secondary | ICD-10-CM | POA: Diagnosis not present

## 2015-07-21 DIAGNOSIS — D631 Anemia in chronic kidney disease: Secondary | ICD-10-CM | POA: Diagnosis not present

## 2015-07-21 DIAGNOSIS — D509 Iron deficiency anemia, unspecified: Secondary | ICD-10-CM | POA: Diagnosis not present

## 2015-07-24 DIAGNOSIS — N186 End stage renal disease: Secondary | ICD-10-CM | POA: Diagnosis not present

## 2015-07-24 DIAGNOSIS — D509 Iron deficiency anemia, unspecified: Secondary | ICD-10-CM | POA: Diagnosis not present

## 2015-07-24 DIAGNOSIS — D631 Anemia in chronic kidney disease: Secondary | ICD-10-CM | POA: Diagnosis not present

## 2015-07-24 DIAGNOSIS — N2581 Secondary hyperparathyroidism of renal origin: Secondary | ICD-10-CM | POA: Diagnosis not present

## 2015-07-26 DIAGNOSIS — D631 Anemia in chronic kidney disease: Secondary | ICD-10-CM | POA: Diagnosis not present

## 2015-07-26 DIAGNOSIS — D509 Iron deficiency anemia, unspecified: Secondary | ICD-10-CM | POA: Diagnosis not present

## 2015-07-26 DIAGNOSIS — N2581 Secondary hyperparathyroidism of renal origin: Secondary | ICD-10-CM | POA: Diagnosis not present

## 2015-07-26 DIAGNOSIS — N186 End stage renal disease: Secondary | ICD-10-CM | POA: Diagnosis not present

## 2015-07-28 DIAGNOSIS — N186 End stage renal disease: Secondary | ICD-10-CM | POA: Diagnosis not present

## 2015-07-28 DIAGNOSIS — D509 Iron deficiency anemia, unspecified: Secondary | ICD-10-CM | POA: Diagnosis not present

## 2015-07-28 DIAGNOSIS — Z992 Dependence on renal dialysis: Secondary | ICD-10-CM | POA: Diagnosis not present

## 2015-07-28 DIAGNOSIS — I120 Hypertensive chronic kidney disease with stage 5 chronic kidney disease or end stage renal disease: Secondary | ICD-10-CM | POA: Diagnosis not present

## 2015-07-28 DIAGNOSIS — D631 Anemia in chronic kidney disease: Secondary | ICD-10-CM | POA: Diagnosis not present

## 2015-07-28 DIAGNOSIS — N2581 Secondary hyperparathyroidism of renal origin: Secondary | ICD-10-CM | POA: Diagnosis not present

## 2015-07-30 DIAGNOSIS — N186 End stage renal disease: Secondary | ICD-10-CM | POA: Diagnosis not present

## 2015-07-30 DIAGNOSIS — D509 Iron deficiency anemia, unspecified: Secondary | ICD-10-CM | POA: Diagnosis not present

## 2015-07-30 DIAGNOSIS — N2581 Secondary hyperparathyroidism of renal origin: Secondary | ICD-10-CM | POA: Diagnosis not present

## 2015-07-30 DIAGNOSIS — D631 Anemia in chronic kidney disease: Secondary | ICD-10-CM | POA: Diagnosis not present

## 2015-08-02 DIAGNOSIS — N186 End stage renal disease: Secondary | ICD-10-CM | POA: Diagnosis not present

## 2015-08-02 DIAGNOSIS — D631 Anemia in chronic kidney disease: Secondary | ICD-10-CM | POA: Diagnosis not present

## 2015-08-02 DIAGNOSIS — D509 Iron deficiency anemia, unspecified: Secondary | ICD-10-CM | POA: Diagnosis not present

## 2015-08-02 DIAGNOSIS — N2581 Secondary hyperparathyroidism of renal origin: Secondary | ICD-10-CM | POA: Diagnosis not present

## 2015-08-04 DIAGNOSIS — D631 Anemia in chronic kidney disease: Secondary | ICD-10-CM | POA: Diagnosis not present

## 2015-08-04 DIAGNOSIS — N186 End stage renal disease: Secondary | ICD-10-CM | POA: Diagnosis not present

## 2015-08-04 DIAGNOSIS — N2581 Secondary hyperparathyroidism of renal origin: Secondary | ICD-10-CM | POA: Diagnosis not present

## 2015-08-04 DIAGNOSIS — D509 Iron deficiency anemia, unspecified: Secondary | ICD-10-CM | POA: Diagnosis not present

## 2015-08-06 DIAGNOSIS — D631 Anemia in chronic kidney disease: Secondary | ICD-10-CM | POA: Diagnosis not present

## 2015-08-06 DIAGNOSIS — D509 Iron deficiency anemia, unspecified: Secondary | ICD-10-CM | POA: Diagnosis not present

## 2015-08-06 DIAGNOSIS — N186 End stage renal disease: Secondary | ICD-10-CM | POA: Diagnosis not present

## 2015-08-06 DIAGNOSIS — N2581 Secondary hyperparathyroidism of renal origin: Secondary | ICD-10-CM | POA: Diagnosis not present

## 2015-08-09 DIAGNOSIS — N186 End stage renal disease: Secondary | ICD-10-CM | POA: Diagnosis not present

## 2015-08-09 DIAGNOSIS — N2581 Secondary hyperparathyroidism of renal origin: Secondary | ICD-10-CM | POA: Diagnosis not present

## 2015-08-09 DIAGNOSIS — D509 Iron deficiency anemia, unspecified: Secondary | ICD-10-CM | POA: Diagnosis not present

## 2015-08-09 DIAGNOSIS — D631 Anemia in chronic kidney disease: Secondary | ICD-10-CM | POA: Diagnosis not present

## 2015-08-11 DIAGNOSIS — D509 Iron deficiency anemia, unspecified: Secondary | ICD-10-CM | POA: Diagnosis not present

## 2015-08-11 DIAGNOSIS — N2581 Secondary hyperparathyroidism of renal origin: Secondary | ICD-10-CM | POA: Diagnosis not present

## 2015-08-11 DIAGNOSIS — D631 Anemia in chronic kidney disease: Secondary | ICD-10-CM | POA: Diagnosis not present

## 2015-08-11 DIAGNOSIS — N186 End stage renal disease: Secondary | ICD-10-CM | POA: Diagnosis not present

## 2015-08-13 DIAGNOSIS — N2581 Secondary hyperparathyroidism of renal origin: Secondary | ICD-10-CM | POA: Diagnosis not present

## 2015-08-13 DIAGNOSIS — D631 Anemia in chronic kidney disease: Secondary | ICD-10-CM | POA: Diagnosis not present

## 2015-08-13 DIAGNOSIS — N186 End stage renal disease: Secondary | ICD-10-CM | POA: Diagnosis not present

## 2015-08-13 DIAGNOSIS — D509 Iron deficiency anemia, unspecified: Secondary | ICD-10-CM | POA: Diagnosis not present

## 2015-08-16 DIAGNOSIS — D631 Anemia in chronic kidney disease: Secondary | ICD-10-CM | POA: Diagnosis not present

## 2015-08-16 DIAGNOSIS — N2581 Secondary hyperparathyroidism of renal origin: Secondary | ICD-10-CM | POA: Diagnosis not present

## 2015-08-16 DIAGNOSIS — D509 Iron deficiency anemia, unspecified: Secondary | ICD-10-CM | POA: Diagnosis not present

## 2015-08-16 DIAGNOSIS — N186 End stage renal disease: Secondary | ICD-10-CM | POA: Diagnosis not present

## 2015-08-18 DIAGNOSIS — D631 Anemia in chronic kidney disease: Secondary | ICD-10-CM | POA: Diagnosis not present

## 2015-08-18 DIAGNOSIS — D509 Iron deficiency anemia, unspecified: Secondary | ICD-10-CM | POA: Diagnosis not present

## 2015-08-18 DIAGNOSIS — N186 End stage renal disease: Secondary | ICD-10-CM | POA: Diagnosis not present

## 2015-08-18 DIAGNOSIS — N2581 Secondary hyperparathyroidism of renal origin: Secondary | ICD-10-CM | POA: Diagnosis not present

## 2015-08-20 DIAGNOSIS — D631 Anemia in chronic kidney disease: Secondary | ICD-10-CM | POA: Diagnosis not present

## 2015-08-20 DIAGNOSIS — D509 Iron deficiency anemia, unspecified: Secondary | ICD-10-CM | POA: Diagnosis not present

## 2015-08-20 DIAGNOSIS — N186 End stage renal disease: Secondary | ICD-10-CM | POA: Diagnosis not present

## 2015-08-20 DIAGNOSIS — N2581 Secondary hyperparathyroidism of renal origin: Secondary | ICD-10-CM | POA: Diagnosis not present

## 2015-08-23 DIAGNOSIS — D631 Anemia in chronic kidney disease: Secondary | ICD-10-CM | POA: Diagnosis not present

## 2015-08-23 DIAGNOSIS — N186 End stage renal disease: Secondary | ICD-10-CM | POA: Diagnosis not present

## 2015-08-23 DIAGNOSIS — N2581 Secondary hyperparathyroidism of renal origin: Secondary | ICD-10-CM | POA: Diagnosis not present

## 2015-08-23 DIAGNOSIS — D509 Iron deficiency anemia, unspecified: Secondary | ICD-10-CM | POA: Diagnosis not present

## 2015-08-25 DIAGNOSIS — N2581 Secondary hyperparathyroidism of renal origin: Secondary | ICD-10-CM | POA: Diagnosis not present

## 2015-08-25 DIAGNOSIS — D631 Anemia in chronic kidney disease: Secondary | ICD-10-CM | POA: Diagnosis not present

## 2015-08-25 DIAGNOSIS — N186 End stage renal disease: Secondary | ICD-10-CM | POA: Diagnosis not present

## 2015-08-25 DIAGNOSIS — D509 Iron deficiency anemia, unspecified: Secondary | ICD-10-CM | POA: Diagnosis not present

## 2015-08-27 DIAGNOSIS — D509 Iron deficiency anemia, unspecified: Secondary | ICD-10-CM | POA: Diagnosis not present

## 2015-08-27 DIAGNOSIS — E221 Hyperprolactinemia: Secondary | ICD-10-CM | POA: Diagnosis not present

## 2015-08-27 DIAGNOSIS — D631 Anemia in chronic kidney disease: Secondary | ICD-10-CM | POA: Diagnosis not present

## 2015-08-27 DIAGNOSIS — N2581 Secondary hyperparathyroidism of renal origin: Secondary | ICD-10-CM | POA: Diagnosis not present

## 2015-08-27 DIAGNOSIS — N186 End stage renal disease: Secondary | ICD-10-CM | POA: Diagnosis not present

## 2015-08-28 DIAGNOSIS — I120 Hypertensive chronic kidney disease with stage 5 chronic kidney disease or end stage renal disease: Secondary | ICD-10-CM | POA: Diagnosis not present

## 2015-08-28 DIAGNOSIS — N186 End stage renal disease: Secondary | ICD-10-CM | POA: Diagnosis not present

## 2015-08-28 DIAGNOSIS — Z992 Dependence on renal dialysis: Secondary | ICD-10-CM | POA: Diagnosis not present

## 2015-08-30 DIAGNOSIS — N2581 Secondary hyperparathyroidism of renal origin: Secondary | ICD-10-CM | POA: Diagnosis not present

## 2015-08-30 DIAGNOSIS — D631 Anemia in chronic kidney disease: Secondary | ICD-10-CM | POA: Diagnosis not present

## 2015-08-30 DIAGNOSIS — N186 End stage renal disease: Secondary | ICD-10-CM | POA: Diagnosis not present

## 2015-08-30 DIAGNOSIS — D509 Iron deficiency anemia, unspecified: Secondary | ICD-10-CM | POA: Diagnosis not present

## 2015-09-01 DIAGNOSIS — D631 Anemia in chronic kidney disease: Secondary | ICD-10-CM | POA: Diagnosis not present

## 2015-09-01 DIAGNOSIS — D509 Iron deficiency anemia, unspecified: Secondary | ICD-10-CM | POA: Diagnosis not present

## 2015-09-01 DIAGNOSIS — N186 End stage renal disease: Secondary | ICD-10-CM | POA: Diagnosis not present

## 2015-09-01 DIAGNOSIS — N2581 Secondary hyperparathyroidism of renal origin: Secondary | ICD-10-CM | POA: Diagnosis not present

## 2015-09-03 DIAGNOSIS — N186 End stage renal disease: Secondary | ICD-10-CM | POA: Diagnosis not present

## 2015-09-03 DIAGNOSIS — D509 Iron deficiency anemia, unspecified: Secondary | ICD-10-CM | POA: Diagnosis not present

## 2015-09-03 DIAGNOSIS — N2581 Secondary hyperparathyroidism of renal origin: Secondary | ICD-10-CM | POA: Diagnosis not present

## 2015-09-03 DIAGNOSIS — D631 Anemia in chronic kidney disease: Secondary | ICD-10-CM | POA: Diagnosis not present

## 2015-09-06 DIAGNOSIS — D631 Anemia in chronic kidney disease: Secondary | ICD-10-CM | POA: Diagnosis not present

## 2015-09-06 DIAGNOSIS — N186 End stage renal disease: Secondary | ICD-10-CM | POA: Diagnosis not present

## 2015-09-06 DIAGNOSIS — N2581 Secondary hyperparathyroidism of renal origin: Secondary | ICD-10-CM | POA: Diagnosis not present

## 2015-09-06 DIAGNOSIS — D509 Iron deficiency anemia, unspecified: Secondary | ICD-10-CM | POA: Diagnosis not present

## 2015-09-08 DIAGNOSIS — D631 Anemia in chronic kidney disease: Secondary | ICD-10-CM | POA: Diagnosis not present

## 2015-09-08 DIAGNOSIS — N186 End stage renal disease: Secondary | ICD-10-CM | POA: Diagnosis not present

## 2015-09-08 DIAGNOSIS — N2581 Secondary hyperparathyroidism of renal origin: Secondary | ICD-10-CM | POA: Diagnosis not present

## 2015-09-08 DIAGNOSIS — D509 Iron deficiency anemia, unspecified: Secondary | ICD-10-CM | POA: Diagnosis not present

## 2015-09-10 DIAGNOSIS — N186 End stage renal disease: Secondary | ICD-10-CM | POA: Diagnosis not present

## 2015-09-10 DIAGNOSIS — D631 Anemia in chronic kidney disease: Secondary | ICD-10-CM | POA: Diagnosis not present

## 2015-09-10 DIAGNOSIS — D509 Iron deficiency anemia, unspecified: Secondary | ICD-10-CM | POA: Diagnosis not present

## 2015-09-10 DIAGNOSIS — N2581 Secondary hyperparathyroidism of renal origin: Secondary | ICD-10-CM | POA: Diagnosis not present

## 2015-09-13 DIAGNOSIS — N2581 Secondary hyperparathyroidism of renal origin: Secondary | ICD-10-CM | POA: Diagnosis not present

## 2015-09-13 DIAGNOSIS — D509 Iron deficiency anemia, unspecified: Secondary | ICD-10-CM | POA: Diagnosis not present

## 2015-09-13 DIAGNOSIS — D631 Anemia in chronic kidney disease: Secondary | ICD-10-CM | POA: Diagnosis not present

## 2015-09-13 DIAGNOSIS — N186 End stage renal disease: Secondary | ICD-10-CM | POA: Diagnosis not present

## 2015-09-14 DIAGNOSIS — M542 Cervicalgia: Secondary | ICD-10-CM | POA: Diagnosis not present

## 2015-09-14 DIAGNOSIS — M545 Low back pain: Secondary | ICD-10-CM | POA: Diagnosis not present

## 2015-09-15 DIAGNOSIS — N2581 Secondary hyperparathyroidism of renal origin: Secondary | ICD-10-CM | POA: Diagnosis not present

## 2015-09-15 DIAGNOSIS — D631 Anemia in chronic kidney disease: Secondary | ICD-10-CM | POA: Diagnosis not present

## 2015-09-15 DIAGNOSIS — D509 Iron deficiency anemia, unspecified: Secondary | ICD-10-CM | POA: Diagnosis not present

## 2015-09-15 DIAGNOSIS — N186 End stage renal disease: Secondary | ICD-10-CM | POA: Diagnosis not present

## 2015-09-17 DIAGNOSIS — N2581 Secondary hyperparathyroidism of renal origin: Secondary | ICD-10-CM | POA: Diagnosis not present

## 2015-09-17 DIAGNOSIS — D631 Anemia in chronic kidney disease: Secondary | ICD-10-CM | POA: Diagnosis not present

## 2015-09-17 DIAGNOSIS — D509 Iron deficiency anemia, unspecified: Secondary | ICD-10-CM | POA: Diagnosis not present

## 2015-09-17 DIAGNOSIS — N186 End stage renal disease: Secondary | ICD-10-CM | POA: Diagnosis not present

## 2015-09-20 DIAGNOSIS — N2581 Secondary hyperparathyroidism of renal origin: Secondary | ICD-10-CM | POA: Diagnosis not present

## 2015-09-20 DIAGNOSIS — D631 Anemia in chronic kidney disease: Secondary | ICD-10-CM | POA: Diagnosis not present

## 2015-09-20 DIAGNOSIS — D509 Iron deficiency anemia, unspecified: Secondary | ICD-10-CM | POA: Diagnosis not present

## 2015-09-20 DIAGNOSIS — N186 End stage renal disease: Secondary | ICD-10-CM | POA: Diagnosis not present

## 2015-09-21 DIAGNOSIS — M542 Cervicalgia: Secondary | ICD-10-CM | POA: Diagnosis not present

## 2015-09-21 DIAGNOSIS — M545 Low back pain: Secondary | ICD-10-CM | POA: Diagnosis not present

## 2015-09-22 DIAGNOSIS — N186 End stage renal disease: Secondary | ICD-10-CM | POA: Diagnosis not present

## 2015-09-22 DIAGNOSIS — D509 Iron deficiency anemia, unspecified: Secondary | ICD-10-CM | POA: Diagnosis not present

## 2015-09-22 DIAGNOSIS — D631 Anemia in chronic kidney disease: Secondary | ICD-10-CM | POA: Diagnosis not present

## 2015-09-22 DIAGNOSIS — N2581 Secondary hyperparathyroidism of renal origin: Secondary | ICD-10-CM | POA: Diagnosis not present

## 2015-09-23 DIAGNOSIS — E221 Hyperprolactinemia: Secondary | ICD-10-CM | POA: Diagnosis not present

## 2015-09-23 DIAGNOSIS — M545 Low back pain: Secondary | ICD-10-CM | POA: Diagnosis not present

## 2015-09-24 DIAGNOSIS — D509 Iron deficiency anemia, unspecified: Secondary | ICD-10-CM | POA: Diagnosis not present

## 2015-09-24 DIAGNOSIS — N186 End stage renal disease: Secondary | ICD-10-CM | POA: Diagnosis not present

## 2015-09-24 DIAGNOSIS — N2581 Secondary hyperparathyroidism of renal origin: Secondary | ICD-10-CM | POA: Diagnosis not present

## 2015-09-24 DIAGNOSIS — D631 Anemia in chronic kidney disease: Secondary | ICD-10-CM | POA: Diagnosis not present

## 2015-09-27 DIAGNOSIS — N186 End stage renal disease: Secondary | ICD-10-CM | POA: Diagnosis not present

## 2015-09-27 DIAGNOSIS — D509 Iron deficiency anemia, unspecified: Secondary | ICD-10-CM | POA: Diagnosis not present

## 2015-09-27 DIAGNOSIS — N2581 Secondary hyperparathyroidism of renal origin: Secondary | ICD-10-CM | POA: Diagnosis not present

## 2015-09-27 DIAGNOSIS — D631 Anemia in chronic kidney disease: Secondary | ICD-10-CM | POA: Diagnosis not present

## 2015-09-28 DIAGNOSIS — I120 Hypertensive chronic kidney disease with stage 5 chronic kidney disease or end stage renal disease: Secondary | ICD-10-CM | POA: Diagnosis not present

## 2015-09-28 DIAGNOSIS — Z992 Dependence on renal dialysis: Secondary | ICD-10-CM | POA: Diagnosis not present

## 2015-09-28 DIAGNOSIS — N186 End stage renal disease: Secondary | ICD-10-CM | POA: Diagnosis not present

## 2015-09-29 DIAGNOSIS — N2581 Secondary hyperparathyroidism of renal origin: Secondary | ICD-10-CM | POA: Diagnosis not present

## 2015-09-29 DIAGNOSIS — N186 End stage renal disease: Secondary | ICD-10-CM | POA: Diagnosis not present

## 2015-09-29 DIAGNOSIS — D509 Iron deficiency anemia, unspecified: Secondary | ICD-10-CM | POA: Diagnosis not present

## 2015-09-29 DIAGNOSIS — D631 Anemia in chronic kidney disease: Secondary | ICD-10-CM | POA: Diagnosis not present

## 2015-09-30 DIAGNOSIS — M9902 Segmental and somatic dysfunction of thoracic region: Secondary | ICD-10-CM | POA: Diagnosis not present

## 2015-09-30 DIAGNOSIS — M5387 Other specified dorsopathies, lumbosacral region: Secondary | ICD-10-CM | POA: Diagnosis not present

## 2015-09-30 DIAGNOSIS — M9905 Segmental and somatic dysfunction of pelvic region: Secondary | ICD-10-CM | POA: Diagnosis not present

## 2015-09-30 DIAGNOSIS — M461 Sacroiliitis, not elsewhere classified: Secondary | ICD-10-CM | POA: Diagnosis not present

## 2015-09-30 DIAGNOSIS — M5126 Other intervertebral disc displacement, lumbar region: Secondary | ICD-10-CM | POA: Diagnosis not present

## 2015-09-30 DIAGNOSIS — M5125 Other intervertebral disc displacement, thoracolumbar region: Secondary | ICD-10-CM | POA: Diagnosis not present

## 2015-09-30 DIAGNOSIS — M9904 Segmental and somatic dysfunction of sacral region: Secondary | ICD-10-CM | POA: Diagnosis not present

## 2015-09-30 DIAGNOSIS — M9903 Segmental and somatic dysfunction of lumbar region: Secondary | ICD-10-CM | POA: Diagnosis not present

## 2015-10-01 DIAGNOSIS — N2581 Secondary hyperparathyroidism of renal origin: Secondary | ICD-10-CM | POA: Diagnosis not present

## 2015-10-01 DIAGNOSIS — D631 Anemia in chronic kidney disease: Secondary | ICD-10-CM | POA: Diagnosis not present

## 2015-10-01 DIAGNOSIS — D509 Iron deficiency anemia, unspecified: Secondary | ICD-10-CM | POA: Diagnosis not present

## 2015-10-01 DIAGNOSIS — N186 End stage renal disease: Secondary | ICD-10-CM | POA: Diagnosis not present

## 2015-10-04 DIAGNOSIS — D631 Anemia in chronic kidney disease: Secondary | ICD-10-CM | POA: Diagnosis not present

## 2015-10-04 DIAGNOSIS — N2581 Secondary hyperparathyroidism of renal origin: Secondary | ICD-10-CM | POA: Diagnosis not present

## 2015-10-04 DIAGNOSIS — D509 Iron deficiency anemia, unspecified: Secondary | ICD-10-CM | POA: Diagnosis not present

## 2015-10-04 DIAGNOSIS — N186 End stage renal disease: Secondary | ICD-10-CM | POA: Diagnosis not present

## 2015-10-05 DIAGNOSIS — M5126 Other intervertebral disc displacement, lumbar region: Secondary | ICD-10-CM | POA: Diagnosis not present

## 2015-10-05 DIAGNOSIS — M5125 Other intervertebral disc displacement, thoracolumbar region: Secondary | ICD-10-CM | POA: Diagnosis not present

## 2015-10-05 DIAGNOSIS — M9905 Segmental and somatic dysfunction of pelvic region: Secondary | ICD-10-CM | POA: Diagnosis not present

## 2015-10-05 DIAGNOSIS — M50322 Other cervical disc degeneration at C5-C6 level: Secondary | ICD-10-CM | POA: Diagnosis not present

## 2015-10-05 DIAGNOSIS — M9904 Segmental and somatic dysfunction of sacral region: Secondary | ICD-10-CM | POA: Diagnosis not present

## 2015-10-05 DIAGNOSIS — M9903 Segmental and somatic dysfunction of lumbar region: Secondary | ICD-10-CM | POA: Diagnosis not present

## 2015-10-05 DIAGNOSIS — M5387 Other specified dorsopathies, lumbosacral region: Secondary | ICD-10-CM | POA: Diagnosis not present

## 2015-10-05 DIAGNOSIS — M461 Sacroiliitis, not elsewhere classified: Secondary | ICD-10-CM | POA: Diagnosis not present

## 2015-10-05 DIAGNOSIS — M9902 Segmental and somatic dysfunction of thoracic region: Secondary | ICD-10-CM | POA: Diagnosis not present

## 2015-10-05 DIAGNOSIS — M9901 Segmental and somatic dysfunction of cervical region: Secondary | ICD-10-CM | POA: Diagnosis not present

## 2015-10-06 DIAGNOSIS — N2581 Secondary hyperparathyroidism of renal origin: Secondary | ICD-10-CM | POA: Diagnosis not present

## 2015-10-06 DIAGNOSIS — D509 Iron deficiency anemia, unspecified: Secondary | ICD-10-CM | POA: Diagnosis not present

## 2015-10-06 DIAGNOSIS — D631 Anemia in chronic kidney disease: Secondary | ICD-10-CM | POA: Diagnosis not present

## 2015-10-06 DIAGNOSIS — M9905 Segmental and somatic dysfunction of pelvic region: Secondary | ICD-10-CM | POA: Diagnosis not present

## 2015-10-06 DIAGNOSIS — N186 End stage renal disease: Secondary | ICD-10-CM | POA: Diagnosis not present

## 2015-10-06 DIAGNOSIS — M9902 Segmental and somatic dysfunction of thoracic region: Secondary | ICD-10-CM | POA: Diagnosis not present

## 2015-10-06 DIAGNOSIS — M5126 Other intervertebral disc displacement, lumbar region: Secondary | ICD-10-CM | POA: Diagnosis not present

## 2015-10-06 DIAGNOSIS — M9901 Segmental and somatic dysfunction of cervical region: Secondary | ICD-10-CM | POA: Diagnosis not present

## 2015-10-06 DIAGNOSIS — M5387 Other specified dorsopathies, lumbosacral region: Secondary | ICD-10-CM | POA: Diagnosis not present

## 2015-10-06 DIAGNOSIS — M9904 Segmental and somatic dysfunction of sacral region: Secondary | ICD-10-CM | POA: Diagnosis not present

## 2015-10-06 DIAGNOSIS — M461 Sacroiliitis, not elsewhere classified: Secondary | ICD-10-CM | POA: Diagnosis not present

## 2015-10-06 DIAGNOSIS — M5125 Other intervertebral disc displacement, thoracolumbar region: Secondary | ICD-10-CM | POA: Diagnosis not present

## 2015-10-06 DIAGNOSIS — M9903 Segmental and somatic dysfunction of lumbar region: Secondary | ICD-10-CM | POA: Diagnosis not present

## 2015-10-06 DIAGNOSIS — M50322 Other cervical disc degeneration at C5-C6 level: Secondary | ICD-10-CM | POA: Diagnosis not present

## 2015-10-07 DIAGNOSIS — M9905 Segmental and somatic dysfunction of pelvic region: Secondary | ICD-10-CM | POA: Diagnosis not present

## 2015-10-07 DIAGNOSIS — M5126 Other intervertebral disc displacement, lumbar region: Secondary | ICD-10-CM | POA: Diagnosis not present

## 2015-10-07 DIAGNOSIS — M9904 Segmental and somatic dysfunction of sacral region: Secondary | ICD-10-CM | POA: Diagnosis not present

## 2015-10-07 DIAGNOSIS — M9903 Segmental and somatic dysfunction of lumbar region: Secondary | ICD-10-CM | POA: Diagnosis not present

## 2015-10-07 DIAGNOSIS — M5387 Other specified dorsopathies, lumbosacral region: Secondary | ICD-10-CM | POA: Diagnosis not present

## 2015-10-07 DIAGNOSIS — M9901 Segmental and somatic dysfunction of cervical region: Secondary | ICD-10-CM | POA: Diagnosis not present

## 2015-10-07 DIAGNOSIS — M9902 Segmental and somatic dysfunction of thoracic region: Secondary | ICD-10-CM | POA: Diagnosis not present

## 2015-10-07 DIAGNOSIS — M50322 Other cervical disc degeneration at C5-C6 level: Secondary | ICD-10-CM | POA: Diagnosis not present

## 2015-10-07 DIAGNOSIS — M5125 Other intervertebral disc displacement, thoracolumbar region: Secondary | ICD-10-CM | POA: Diagnosis not present

## 2015-10-07 DIAGNOSIS — M461 Sacroiliitis, not elsewhere classified: Secondary | ICD-10-CM | POA: Diagnosis not present

## 2015-10-08 DIAGNOSIS — D509 Iron deficiency anemia, unspecified: Secondary | ICD-10-CM | POA: Diagnosis not present

## 2015-10-08 DIAGNOSIS — N186 End stage renal disease: Secondary | ICD-10-CM | POA: Diagnosis not present

## 2015-10-08 DIAGNOSIS — N2581 Secondary hyperparathyroidism of renal origin: Secondary | ICD-10-CM | POA: Diagnosis not present

## 2015-10-08 DIAGNOSIS — D631 Anemia in chronic kidney disease: Secondary | ICD-10-CM | POA: Diagnosis not present

## 2015-10-11 DIAGNOSIS — D631 Anemia in chronic kidney disease: Secondary | ICD-10-CM | POA: Diagnosis not present

## 2015-10-11 DIAGNOSIS — D509 Iron deficiency anemia, unspecified: Secondary | ICD-10-CM | POA: Diagnosis not present

## 2015-10-11 DIAGNOSIS — N186 End stage renal disease: Secondary | ICD-10-CM | POA: Diagnosis not present

## 2015-10-11 DIAGNOSIS — N2581 Secondary hyperparathyroidism of renal origin: Secondary | ICD-10-CM | POA: Diagnosis not present

## 2015-10-13 DIAGNOSIS — N2581 Secondary hyperparathyroidism of renal origin: Secondary | ICD-10-CM | POA: Diagnosis not present

## 2015-10-13 DIAGNOSIS — D631 Anemia in chronic kidney disease: Secondary | ICD-10-CM | POA: Diagnosis not present

## 2015-10-13 DIAGNOSIS — N186 End stage renal disease: Secondary | ICD-10-CM | POA: Diagnosis not present

## 2015-10-13 DIAGNOSIS — D509 Iron deficiency anemia, unspecified: Secondary | ICD-10-CM | POA: Diagnosis not present

## 2015-10-14 DIAGNOSIS — M9904 Segmental and somatic dysfunction of sacral region: Secondary | ICD-10-CM | POA: Diagnosis not present

## 2015-10-14 DIAGNOSIS — M9905 Segmental and somatic dysfunction of pelvic region: Secondary | ICD-10-CM | POA: Diagnosis not present

## 2015-10-14 DIAGNOSIS — M5387 Other specified dorsopathies, lumbosacral region: Secondary | ICD-10-CM | POA: Diagnosis not present

## 2015-10-14 DIAGNOSIS — M9901 Segmental and somatic dysfunction of cervical region: Secondary | ICD-10-CM | POA: Diagnosis not present

## 2015-10-14 DIAGNOSIS — M50322 Other cervical disc degeneration at C5-C6 level: Secondary | ICD-10-CM | POA: Diagnosis not present

## 2015-10-14 DIAGNOSIS — M5126 Other intervertebral disc displacement, lumbar region: Secondary | ICD-10-CM | POA: Diagnosis not present

## 2015-10-14 DIAGNOSIS — M9902 Segmental and somatic dysfunction of thoracic region: Secondary | ICD-10-CM | POA: Diagnosis not present

## 2015-10-14 DIAGNOSIS — M9903 Segmental and somatic dysfunction of lumbar region: Secondary | ICD-10-CM | POA: Diagnosis not present

## 2015-10-14 DIAGNOSIS — M461 Sacroiliitis, not elsewhere classified: Secondary | ICD-10-CM | POA: Diagnosis not present

## 2015-10-14 DIAGNOSIS — M5125 Other intervertebral disc displacement, thoracolumbar region: Secondary | ICD-10-CM | POA: Diagnosis not present

## 2015-10-15 DIAGNOSIS — D631 Anemia in chronic kidney disease: Secondary | ICD-10-CM | POA: Diagnosis not present

## 2015-10-15 DIAGNOSIS — D509 Iron deficiency anemia, unspecified: Secondary | ICD-10-CM | POA: Diagnosis not present

## 2015-10-15 DIAGNOSIS — N186 End stage renal disease: Secondary | ICD-10-CM | POA: Diagnosis not present

## 2015-10-15 DIAGNOSIS — N2581 Secondary hyperparathyroidism of renal origin: Secondary | ICD-10-CM | POA: Diagnosis not present

## 2015-10-18 DIAGNOSIS — D509 Iron deficiency anemia, unspecified: Secondary | ICD-10-CM | POA: Diagnosis not present

## 2015-10-18 DIAGNOSIS — N186 End stage renal disease: Secondary | ICD-10-CM | POA: Diagnosis not present

## 2015-10-18 DIAGNOSIS — D631 Anemia in chronic kidney disease: Secondary | ICD-10-CM | POA: Diagnosis not present

## 2015-10-18 DIAGNOSIS — N2581 Secondary hyperparathyroidism of renal origin: Secondary | ICD-10-CM | POA: Diagnosis not present

## 2015-10-19 DIAGNOSIS — M50322 Other cervical disc degeneration at C5-C6 level: Secondary | ICD-10-CM | POA: Diagnosis not present

## 2015-10-19 DIAGNOSIS — M9903 Segmental and somatic dysfunction of lumbar region: Secondary | ICD-10-CM | POA: Diagnosis not present

## 2015-10-19 DIAGNOSIS — N186 End stage renal disease: Secondary | ICD-10-CM | POA: Diagnosis not present

## 2015-10-19 DIAGNOSIS — M9902 Segmental and somatic dysfunction of thoracic region: Secondary | ICD-10-CM | POA: Diagnosis not present

## 2015-10-19 DIAGNOSIS — M5387 Other specified dorsopathies, lumbosacral region: Secondary | ICD-10-CM | POA: Diagnosis not present

## 2015-10-19 DIAGNOSIS — M5125 Other intervertebral disc displacement, thoracolumbar region: Secondary | ICD-10-CM | POA: Diagnosis not present

## 2015-10-19 DIAGNOSIS — M9905 Segmental and somatic dysfunction of pelvic region: Secondary | ICD-10-CM | POA: Diagnosis not present

## 2015-10-19 DIAGNOSIS — Z992 Dependence on renal dialysis: Secondary | ICD-10-CM | POA: Diagnosis not present

## 2015-10-19 DIAGNOSIS — I871 Compression of vein: Secondary | ICD-10-CM | POA: Diagnosis not present

## 2015-10-19 DIAGNOSIS — M9904 Segmental and somatic dysfunction of sacral region: Secondary | ICD-10-CM | POA: Diagnosis not present

## 2015-10-19 DIAGNOSIS — M5126 Other intervertebral disc displacement, lumbar region: Secondary | ICD-10-CM | POA: Diagnosis not present

## 2015-10-19 DIAGNOSIS — M461 Sacroiliitis, not elsewhere classified: Secondary | ICD-10-CM | POA: Diagnosis not present

## 2015-10-19 DIAGNOSIS — T82858A Stenosis of vascular prosthetic devices, implants and grafts, initial encounter: Secondary | ICD-10-CM | POA: Diagnosis not present

## 2015-10-19 DIAGNOSIS — M9901 Segmental and somatic dysfunction of cervical region: Secondary | ICD-10-CM | POA: Diagnosis not present

## 2015-10-20 DIAGNOSIS — N2581 Secondary hyperparathyroidism of renal origin: Secondary | ICD-10-CM | POA: Diagnosis not present

## 2015-10-20 DIAGNOSIS — N186 End stage renal disease: Secondary | ICD-10-CM | POA: Diagnosis not present

## 2015-10-20 DIAGNOSIS — D509 Iron deficiency anemia, unspecified: Secondary | ICD-10-CM | POA: Diagnosis not present

## 2015-10-20 DIAGNOSIS — D631 Anemia in chronic kidney disease: Secondary | ICD-10-CM | POA: Diagnosis not present

## 2015-10-21 DIAGNOSIS — M9902 Segmental and somatic dysfunction of thoracic region: Secondary | ICD-10-CM | POA: Diagnosis not present

## 2015-10-21 DIAGNOSIS — M9905 Segmental and somatic dysfunction of pelvic region: Secondary | ICD-10-CM | POA: Diagnosis not present

## 2015-10-21 DIAGNOSIS — M50322 Other cervical disc degeneration at C5-C6 level: Secondary | ICD-10-CM | POA: Diagnosis not present

## 2015-10-21 DIAGNOSIS — M9901 Segmental and somatic dysfunction of cervical region: Secondary | ICD-10-CM | POA: Diagnosis not present

## 2015-10-21 DIAGNOSIS — M461 Sacroiliitis, not elsewhere classified: Secondary | ICD-10-CM | POA: Diagnosis not present

## 2015-10-21 DIAGNOSIS — M5387 Other specified dorsopathies, lumbosacral region: Secondary | ICD-10-CM | POA: Diagnosis not present

## 2015-10-21 DIAGNOSIS — M9904 Segmental and somatic dysfunction of sacral region: Secondary | ICD-10-CM | POA: Diagnosis not present

## 2015-10-21 DIAGNOSIS — M5126 Other intervertebral disc displacement, lumbar region: Secondary | ICD-10-CM | POA: Diagnosis not present

## 2015-10-21 DIAGNOSIS — M9903 Segmental and somatic dysfunction of lumbar region: Secondary | ICD-10-CM | POA: Diagnosis not present

## 2015-10-21 DIAGNOSIS — M5125 Other intervertebral disc displacement, thoracolumbar region: Secondary | ICD-10-CM | POA: Diagnosis not present

## 2015-10-22 DIAGNOSIS — N186 End stage renal disease: Secondary | ICD-10-CM | POA: Diagnosis not present

## 2015-10-22 DIAGNOSIS — D631 Anemia in chronic kidney disease: Secondary | ICD-10-CM | POA: Diagnosis not present

## 2015-10-22 DIAGNOSIS — D509 Iron deficiency anemia, unspecified: Secondary | ICD-10-CM | POA: Diagnosis not present

## 2015-10-22 DIAGNOSIS — N2581 Secondary hyperparathyroidism of renal origin: Secondary | ICD-10-CM | POA: Diagnosis not present

## 2015-10-25 DIAGNOSIS — N2581 Secondary hyperparathyroidism of renal origin: Secondary | ICD-10-CM | POA: Diagnosis not present

## 2015-10-25 DIAGNOSIS — D631 Anemia in chronic kidney disease: Secondary | ICD-10-CM | POA: Diagnosis not present

## 2015-10-25 DIAGNOSIS — N186 End stage renal disease: Secondary | ICD-10-CM | POA: Diagnosis not present

## 2015-10-25 DIAGNOSIS — D509 Iron deficiency anemia, unspecified: Secondary | ICD-10-CM | POA: Diagnosis not present

## 2015-10-26 DIAGNOSIS — Z992 Dependence on renal dialysis: Secondary | ICD-10-CM | POA: Diagnosis not present

## 2015-10-26 DIAGNOSIS — I120 Hypertensive chronic kidney disease with stage 5 chronic kidney disease or end stage renal disease: Secondary | ICD-10-CM | POA: Diagnosis not present

## 2015-10-26 DIAGNOSIS — E221 Hyperprolactinemia: Secondary | ICD-10-CM | POA: Diagnosis not present

## 2015-10-26 DIAGNOSIS — N186 End stage renal disease: Secondary | ICD-10-CM | POA: Diagnosis not present

## 2015-10-27 DIAGNOSIS — N186 End stage renal disease: Secondary | ICD-10-CM | POA: Diagnosis not present

## 2015-10-27 DIAGNOSIS — D509 Iron deficiency anemia, unspecified: Secondary | ICD-10-CM | POA: Diagnosis not present

## 2015-10-27 DIAGNOSIS — D631 Anemia in chronic kidney disease: Secondary | ICD-10-CM | POA: Diagnosis not present

## 2015-10-27 DIAGNOSIS — N2581 Secondary hyperparathyroidism of renal origin: Secondary | ICD-10-CM | POA: Diagnosis not present

## 2015-10-28 DIAGNOSIS — M9903 Segmental and somatic dysfunction of lumbar region: Secondary | ICD-10-CM | POA: Diagnosis not present

## 2015-10-28 DIAGNOSIS — M5387 Other specified dorsopathies, lumbosacral region: Secondary | ICD-10-CM | POA: Diagnosis not present

## 2015-10-28 DIAGNOSIS — M9901 Segmental and somatic dysfunction of cervical region: Secondary | ICD-10-CM | POA: Diagnosis not present

## 2015-10-28 DIAGNOSIS — M9902 Segmental and somatic dysfunction of thoracic region: Secondary | ICD-10-CM | POA: Diagnosis not present

## 2015-10-28 DIAGNOSIS — M50322 Other cervical disc degeneration at C5-C6 level: Secondary | ICD-10-CM | POA: Diagnosis not present

## 2015-10-28 DIAGNOSIS — M461 Sacroiliitis, not elsewhere classified: Secondary | ICD-10-CM | POA: Diagnosis not present

## 2015-10-28 DIAGNOSIS — M9905 Segmental and somatic dysfunction of pelvic region: Secondary | ICD-10-CM | POA: Diagnosis not present

## 2015-10-28 DIAGNOSIS — M5126 Other intervertebral disc displacement, lumbar region: Secondary | ICD-10-CM | POA: Diagnosis not present

## 2015-10-28 DIAGNOSIS — M5125 Other intervertebral disc displacement, thoracolumbar region: Secondary | ICD-10-CM | POA: Diagnosis not present

## 2015-10-28 DIAGNOSIS — M9904 Segmental and somatic dysfunction of sacral region: Secondary | ICD-10-CM | POA: Diagnosis not present

## 2015-10-29 DIAGNOSIS — N2581 Secondary hyperparathyroidism of renal origin: Secondary | ICD-10-CM | POA: Diagnosis not present

## 2015-10-29 DIAGNOSIS — N186 End stage renal disease: Secondary | ICD-10-CM | POA: Diagnosis not present

## 2015-10-29 DIAGNOSIS — D631 Anemia in chronic kidney disease: Secondary | ICD-10-CM | POA: Diagnosis not present

## 2015-10-29 DIAGNOSIS — D509 Iron deficiency anemia, unspecified: Secondary | ICD-10-CM | POA: Diagnosis not present

## 2015-11-01 DIAGNOSIS — D509 Iron deficiency anemia, unspecified: Secondary | ICD-10-CM | POA: Diagnosis not present

## 2015-11-01 DIAGNOSIS — D631 Anemia in chronic kidney disease: Secondary | ICD-10-CM | POA: Diagnosis not present

## 2015-11-01 DIAGNOSIS — N2581 Secondary hyperparathyroidism of renal origin: Secondary | ICD-10-CM | POA: Diagnosis not present

## 2015-11-01 DIAGNOSIS — N186 End stage renal disease: Secondary | ICD-10-CM | POA: Diagnosis not present

## 2015-11-02 DIAGNOSIS — M5126 Other intervertebral disc displacement, lumbar region: Secondary | ICD-10-CM | POA: Diagnosis not present

## 2015-11-02 DIAGNOSIS — M461 Sacroiliitis, not elsewhere classified: Secondary | ICD-10-CM | POA: Diagnosis not present

## 2015-11-02 DIAGNOSIS — M9901 Segmental and somatic dysfunction of cervical region: Secondary | ICD-10-CM | POA: Diagnosis not present

## 2015-11-02 DIAGNOSIS — M9903 Segmental and somatic dysfunction of lumbar region: Secondary | ICD-10-CM | POA: Diagnosis not present

## 2015-11-02 DIAGNOSIS — M9902 Segmental and somatic dysfunction of thoracic region: Secondary | ICD-10-CM | POA: Diagnosis not present

## 2015-11-02 DIAGNOSIS — M9904 Segmental and somatic dysfunction of sacral region: Secondary | ICD-10-CM | POA: Diagnosis not present

## 2015-11-02 DIAGNOSIS — M5125 Other intervertebral disc displacement, thoracolumbar region: Secondary | ICD-10-CM | POA: Diagnosis not present

## 2015-11-02 DIAGNOSIS — M50322 Other cervical disc degeneration at C5-C6 level: Secondary | ICD-10-CM | POA: Diagnosis not present

## 2015-11-02 DIAGNOSIS — M5387 Other specified dorsopathies, lumbosacral region: Secondary | ICD-10-CM | POA: Diagnosis not present

## 2015-11-02 DIAGNOSIS — M9905 Segmental and somatic dysfunction of pelvic region: Secondary | ICD-10-CM | POA: Diagnosis not present

## 2015-11-03 DIAGNOSIS — N2581 Secondary hyperparathyroidism of renal origin: Secondary | ICD-10-CM | POA: Diagnosis not present

## 2015-11-03 DIAGNOSIS — D631 Anemia in chronic kidney disease: Secondary | ICD-10-CM | POA: Diagnosis not present

## 2015-11-03 DIAGNOSIS — N186 End stage renal disease: Secondary | ICD-10-CM | POA: Diagnosis not present

## 2015-11-03 DIAGNOSIS — D509 Iron deficiency anemia, unspecified: Secondary | ICD-10-CM | POA: Diagnosis not present

## 2015-11-05 DIAGNOSIS — D631 Anemia in chronic kidney disease: Secondary | ICD-10-CM | POA: Diagnosis not present

## 2015-11-05 DIAGNOSIS — N2581 Secondary hyperparathyroidism of renal origin: Secondary | ICD-10-CM | POA: Diagnosis not present

## 2015-11-05 DIAGNOSIS — D509 Iron deficiency anemia, unspecified: Secondary | ICD-10-CM | POA: Diagnosis not present

## 2015-11-05 DIAGNOSIS — N186 End stage renal disease: Secondary | ICD-10-CM | POA: Diagnosis not present

## 2015-11-08 DIAGNOSIS — N186 End stage renal disease: Secondary | ICD-10-CM | POA: Diagnosis not present

## 2015-11-08 DIAGNOSIS — D509 Iron deficiency anemia, unspecified: Secondary | ICD-10-CM | POA: Diagnosis not present

## 2015-11-08 DIAGNOSIS — N2581 Secondary hyperparathyroidism of renal origin: Secondary | ICD-10-CM | POA: Diagnosis not present

## 2015-11-08 DIAGNOSIS — D631 Anemia in chronic kidney disease: Secondary | ICD-10-CM | POA: Diagnosis not present

## 2015-11-09 DIAGNOSIS — M9903 Segmental and somatic dysfunction of lumbar region: Secondary | ICD-10-CM | POA: Diagnosis not present

## 2015-11-09 DIAGNOSIS — M5125 Other intervertebral disc displacement, thoracolumbar region: Secondary | ICD-10-CM | POA: Diagnosis not present

## 2015-11-09 DIAGNOSIS — M461 Sacroiliitis, not elsewhere classified: Secondary | ICD-10-CM | POA: Diagnosis not present

## 2015-11-09 DIAGNOSIS — M5126 Other intervertebral disc displacement, lumbar region: Secondary | ICD-10-CM | POA: Diagnosis not present

## 2015-11-09 DIAGNOSIS — M9902 Segmental and somatic dysfunction of thoracic region: Secondary | ICD-10-CM | POA: Diagnosis not present

## 2015-11-09 DIAGNOSIS — M9904 Segmental and somatic dysfunction of sacral region: Secondary | ICD-10-CM | POA: Diagnosis not present

## 2015-11-09 DIAGNOSIS — M5387 Other specified dorsopathies, lumbosacral region: Secondary | ICD-10-CM | POA: Diagnosis not present

## 2015-11-09 DIAGNOSIS — M50322 Other cervical disc degeneration at C5-C6 level: Secondary | ICD-10-CM | POA: Diagnosis not present

## 2015-11-09 DIAGNOSIS — M9905 Segmental and somatic dysfunction of pelvic region: Secondary | ICD-10-CM | POA: Diagnosis not present

## 2015-11-09 DIAGNOSIS — M9901 Segmental and somatic dysfunction of cervical region: Secondary | ICD-10-CM | POA: Diagnosis not present

## 2015-11-10 DIAGNOSIS — D631 Anemia in chronic kidney disease: Secondary | ICD-10-CM | POA: Diagnosis not present

## 2015-11-10 DIAGNOSIS — N2581 Secondary hyperparathyroidism of renal origin: Secondary | ICD-10-CM | POA: Diagnosis not present

## 2015-11-10 DIAGNOSIS — D509 Iron deficiency anemia, unspecified: Secondary | ICD-10-CM | POA: Diagnosis not present

## 2015-11-10 DIAGNOSIS — N186 End stage renal disease: Secondary | ICD-10-CM | POA: Diagnosis not present

## 2015-11-12 DIAGNOSIS — D509 Iron deficiency anemia, unspecified: Secondary | ICD-10-CM | POA: Diagnosis not present

## 2015-11-12 DIAGNOSIS — N2581 Secondary hyperparathyroidism of renal origin: Secondary | ICD-10-CM | POA: Diagnosis not present

## 2015-11-12 DIAGNOSIS — D631 Anemia in chronic kidney disease: Secondary | ICD-10-CM | POA: Diagnosis not present

## 2015-11-12 DIAGNOSIS — N186 End stage renal disease: Secondary | ICD-10-CM | POA: Diagnosis not present

## 2015-11-15 DIAGNOSIS — D509 Iron deficiency anemia, unspecified: Secondary | ICD-10-CM | POA: Diagnosis not present

## 2015-11-15 DIAGNOSIS — D631 Anemia in chronic kidney disease: Secondary | ICD-10-CM | POA: Diagnosis not present

## 2015-11-15 DIAGNOSIS — N2581 Secondary hyperparathyroidism of renal origin: Secondary | ICD-10-CM | POA: Diagnosis not present

## 2015-11-15 DIAGNOSIS — N186 End stage renal disease: Secondary | ICD-10-CM | POA: Diagnosis not present

## 2015-11-16 DIAGNOSIS — M9902 Segmental and somatic dysfunction of thoracic region: Secondary | ICD-10-CM | POA: Diagnosis not present

## 2015-11-16 DIAGNOSIS — M9901 Segmental and somatic dysfunction of cervical region: Secondary | ICD-10-CM | POA: Diagnosis not present

## 2015-11-16 DIAGNOSIS — M5387 Other specified dorsopathies, lumbosacral region: Secondary | ICD-10-CM | POA: Diagnosis not present

## 2015-11-16 DIAGNOSIS — M9905 Segmental and somatic dysfunction of pelvic region: Secondary | ICD-10-CM | POA: Diagnosis not present

## 2015-11-16 DIAGNOSIS — M5125 Other intervertebral disc displacement, thoracolumbar region: Secondary | ICD-10-CM | POA: Diagnosis not present

## 2015-11-16 DIAGNOSIS — M461 Sacroiliitis, not elsewhere classified: Secondary | ICD-10-CM | POA: Diagnosis not present

## 2015-11-16 DIAGNOSIS — M50322 Other cervical disc degeneration at C5-C6 level: Secondary | ICD-10-CM | POA: Diagnosis not present

## 2015-11-16 DIAGNOSIS — M5126 Other intervertebral disc displacement, lumbar region: Secondary | ICD-10-CM | POA: Diagnosis not present

## 2015-11-16 DIAGNOSIS — M9904 Segmental and somatic dysfunction of sacral region: Secondary | ICD-10-CM | POA: Diagnosis not present

## 2015-11-16 DIAGNOSIS — M9903 Segmental and somatic dysfunction of lumbar region: Secondary | ICD-10-CM | POA: Diagnosis not present

## 2015-11-17 DIAGNOSIS — D509 Iron deficiency anemia, unspecified: Secondary | ICD-10-CM | POA: Diagnosis not present

## 2015-11-17 DIAGNOSIS — N2581 Secondary hyperparathyroidism of renal origin: Secondary | ICD-10-CM | POA: Diagnosis not present

## 2015-11-17 DIAGNOSIS — D631 Anemia in chronic kidney disease: Secondary | ICD-10-CM | POA: Diagnosis not present

## 2015-11-17 DIAGNOSIS — N186 End stage renal disease: Secondary | ICD-10-CM | POA: Diagnosis not present

## 2015-11-19 DIAGNOSIS — D509 Iron deficiency anemia, unspecified: Secondary | ICD-10-CM | POA: Diagnosis not present

## 2015-11-19 DIAGNOSIS — N2581 Secondary hyperparathyroidism of renal origin: Secondary | ICD-10-CM | POA: Diagnosis not present

## 2015-11-19 DIAGNOSIS — N186 End stage renal disease: Secondary | ICD-10-CM | POA: Diagnosis not present

## 2015-11-19 DIAGNOSIS — D631 Anemia in chronic kidney disease: Secondary | ICD-10-CM | POA: Diagnosis not present

## 2015-11-22 DIAGNOSIS — D509 Iron deficiency anemia, unspecified: Secondary | ICD-10-CM | POA: Diagnosis not present

## 2015-11-22 DIAGNOSIS — N186 End stage renal disease: Secondary | ICD-10-CM | POA: Diagnosis not present

## 2015-11-22 DIAGNOSIS — D631 Anemia in chronic kidney disease: Secondary | ICD-10-CM | POA: Diagnosis not present

## 2015-11-22 DIAGNOSIS — N2581 Secondary hyperparathyroidism of renal origin: Secondary | ICD-10-CM | POA: Diagnosis not present

## 2015-11-24 DIAGNOSIS — N186 End stage renal disease: Secondary | ICD-10-CM | POA: Diagnosis not present

## 2015-11-24 DIAGNOSIS — N2581 Secondary hyperparathyroidism of renal origin: Secondary | ICD-10-CM | POA: Diagnosis not present

## 2015-11-24 DIAGNOSIS — D509 Iron deficiency anemia, unspecified: Secondary | ICD-10-CM | POA: Diagnosis not present

## 2015-11-24 DIAGNOSIS — D631 Anemia in chronic kidney disease: Secondary | ICD-10-CM | POA: Diagnosis not present

## 2015-11-26 DIAGNOSIS — N2581 Secondary hyperparathyroidism of renal origin: Secondary | ICD-10-CM | POA: Diagnosis not present

## 2015-11-26 DIAGNOSIS — I120 Hypertensive chronic kidney disease with stage 5 chronic kidney disease or end stage renal disease: Secondary | ICD-10-CM | POA: Diagnosis not present

## 2015-11-26 DIAGNOSIS — D631 Anemia in chronic kidney disease: Secondary | ICD-10-CM | POA: Diagnosis not present

## 2015-11-26 DIAGNOSIS — Z992 Dependence on renal dialysis: Secondary | ICD-10-CM | POA: Diagnosis not present

## 2015-11-26 DIAGNOSIS — N186 End stage renal disease: Secondary | ICD-10-CM | POA: Diagnosis not present

## 2015-11-26 DIAGNOSIS — D509 Iron deficiency anemia, unspecified: Secondary | ICD-10-CM | POA: Diagnosis not present

## 2015-11-29 DIAGNOSIS — N186 End stage renal disease: Secondary | ICD-10-CM | POA: Diagnosis not present

## 2015-11-29 DIAGNOSIS — D509 Iron deficiency anemia, unspecified: Secondary | ICD-10-CM | POA: Diagnosis not present

## 2015-11-29 DIAGNOSIS — D631 Anemia in chronic kidney disease: Secondary | ICD-10-CM | POA: Diagnosis not present

## 2015-11-29 DIAGNOSIS — N2581 Secondary hyperparathyroidism of renal origin: Secondary | ICD-10-CM | POA: Diagnosis not present

## 2015-11-30 DIAGNOSIS — M5387 Other specified dorsopathies, lumbosacral region: Secondary | ICD-10-CM | POA: Diagnosis not present

## 2015-11-30 DIAGNOSIS — M5126 Other intervertebral disc displacement, lumbar region: Secondary | ICD-10-CM | POA: Diagnosis not present

## 2015-11-30 DIAGNOSIS — M9904 Segmental and somatic dysfunction of sacral region: Secondary | ICD-10-CM | POA: Diagnosis not present

## 2015-11-30 DIAGNOSIS — M9905 Segmental and somatic dysfunction of pelvic region: Secondary | ICD-10-CM | POA: Diagnosis not present

## 2015-11-30 DIAGNOSIS — M461 Sacroiliitis, not elsewhere classified: Secondary | ICD-10-CM | POA: Diagnosis not present

## 2015-11-30 DIAGNOSIS — M5125 Other intervertebral disc displacement, thoracolumbar region: Secondary | ICD-10-CM | POA: Diagnosis not present

## 2015-11-30 DIAGNOSIS — M50322 Other cervical disc degeneration at C5-C6 level: Secondary | ICD-10-CM | POA: Diagnosis not present

## 2015-11-30 DIAGNOSIS — M9901 Segmental and somatic dysfunction of cervical region: Secondary | ICD-10-CM | POA: Diagnosis not present

## 2015-11-30 DIAGNOSIS — M9903 Segmental and somatic dysfunction of lumbar region: Secondary | ICD-10-CM | POA: Diagnosis not present

## 2015-11-30 DIAGNOSIS — M9902 Segmental and somatic dysfunction of thoracic region: Secondary | ICD-10-CM | POA: Diagnosis not present

## 2015-12-01 DIAGNOSIS — D631 Anemia in chronic kidney disease: Secondary | ICD-10-CM | POA: Diagnosis not present

## 2015-12-01 DIAGNOSIS — N2581 Secondary hyperparathyroidism of renal origin: Secondary | ICD-10-CM | POA: Diagnosis not present

## 2015-12-01 DIAGNOSIS — D509 Iron deficiency anemia, unspecified: Secondary | ICD-10-CM | POA: Diagnosis not present

## 2015-12-01 DIAGNOSIS — N186 End stage renal disease: Secondary | ICD-10-CM | POA: Diagnosis not present

## 2015-12-03 DIAGNOSIS — D509 Iron deficiency anemia, unspecified: Secondary | ICD-10-CM | POA: Diagnosis not present

## 2015-12-03 DIAGNOSIS — N186 End stage renal disease: Secondary | ICD-10-CM | POA: Diagnosis not present

## 2015-12-03 DIAGNOSIS — D631 Anemia in chronic kidney disease: Secondary | ICD-10-CM | POA: Diagnosis not present

## 2015-12-03 DIAGNOSIS — N2581 Secondary hyperparathyroidism of renal origin: Secondary | ICD-10-CM | POA: Diagnosis not present

## 2015-12-06 DIAGNOSIS — D631 Anemia in chronic kidney disease: Secondary | ICD-10-CM | POA: Diagnosis not present

## 2015-12-06 DIAGNOSIS — N186 End stage renal disease: Secondary | ICD-10-CM | POA: Diagnosis not present

## 2015-12-06 DIAGNOSIS — N2581 Secondary hyperparathyroidism of renal origin: Secondary | ICD-10-CM | POA: Diagnosis not present

## 2015-12-06 DIAGNOSIS — D509 Iron deficiency anemia, unspecified: Secondary | ICD-10-CM | POA: Diagnosis not present

## 2015-12-08 DIAGNOSIS — D509 Iron deficiency anemia, unspecified: Secondary | ICD-10-CM | POA: Diagnosis not present

## 2015-12-08 DIAGNOSIS — N2581 Secondary hyperparathyroidism of renal origin: Secondary | ICD-10-CM | POA: Diagnosis not present

## 2015-12-08 DIAGNOSIS — N186 End stage renal disease: Secondary | ICD-10-CM | POA: Diagnosis not present

## 2015-12-08 DIAGNOSIS — D631 Anemia in chronic kidney disease: Secondary | ICD-10-CM | POA: Diagnosis not present

## 2015-12-10 DIAGNOSIS — D509 Iron deficiency anemia, unspecified: Secondary | ICD-10-CM | POA: Diagnosis not present

## 2015-12-10 DIAGNOSIS — N2581 Secondary hyperparathyroidism of renal origin: Secondary | ICD-10-CM | POA: Diagnosis not present

## 2015-12-10 DIAGNOSIS — N186 End stage renal disease: Secondary | ICD-10-CM | POA: Diagnosis not present

## 2015-12-10 DIAGNOSIS — D631 Anemia in chronic kidney disease: Secondary | ICD-10-CM | POA: Diagnosis not present

## 2015-12-13 DIAGNOSIS — D509 Iron deficiency anemia, unspecified: Secondary | ICD-10-CM | POA: Diagnosis not present

## 2015-12-13 DIAGNOSIS — N2581 Secondary hyperparathyroidism of renal origin: Secondary | ICD-10-CM | POA: Diagnosis not present

## 2015-12-13 DIAGNOSIS — D631 Anemia in chronic kidney disease: Secondary | ICD-10-CM | POA: Diagnosis not present

## 2015-12-13 DIAGNOSIS — N186 End stage renal disease: Secondary | ICD-10-CM | POA: Diagnosis not present

## 2015-12-15 DIAGNOSIS — D509 Iron deficiency anemia, unspecified: Secondary | ICD-10-CM | POA: Diagnosis not present

## 2015-12-15 DIAGNOSIS — N2581 Secondary hyperparathyroidism of renal origin: Secondary | ICD-10-CM | POA: Diagnosis not present

## 2015-12-15 DIAGNOSIS — N186 End stage renal disease: Secondary | ICD-10-CM | POA: Diagnosis not present

## 2015-12-15 DIAGNOSIS — D631 Anemia in chronic kidney disease: Secondary | ICD-10-CM | POA: Diagnosis not present

## 2015-12-17 DIAGNOSIS — N186 End stage renal disease: Secondary | ICD-10-CM | POA: Diagnosis not present

## 2015-12-17 DIAGNOSIS — D631 Anemia in chronic kidney disease: Secondary | ICD-10-CM | POA: Diagnosis not present

## 2015-12-17 DIAGNOSIS — N2581 Secondary hyperparathyroidism of renal origin: Secondary | ICD-10-CM | POA: Diagnosis not present

## 2015-12-17 DIAGNOSIS — D509 Iron deficiency anemia, unspecified: Secondary | ICD-10-CM | POA: Diagnosis not present

## 2015-12-20 DIAGNOSIS — D631 Anemia in chronic kidney disease: Secondary | ICD-10-CM | POA: Diagnosis not present

## 2015-12-20 DIAGNOSIS — D509 Iron deficiency anemia, unspecified: Secondary | ICD-10-CM | POA: Diagnosis not present

## 2015-12-20 DIAGNOSIS — N186 End stage renal disease: Secondary | ICD-10-CM | POA: Diagnosis not present

## 2015-12-20 DIAGNOSIS — N2581 Secondary hyperparathyroidism of renal origin: Secondary | ICD-10-CM | POA: Diagnosis not present

## 2015-12-22 DIAGNOSIS — N186 End stage renal disease: Secondary | ICD-10-CM | POA: Diagnosis not present

## 2015-12-22 DIAGNOSIS — D509 Iron deficiency anemia, unspecified: Secondary | ICD-10-CM | POA: Diagnosis not present

## 2015-12-22 DIAGNOSIS — N2581 Secondary hyperparathyroidism of renal origin: Secondary | ICD-10-CM | POA: Diagnosis not present

## 2015-12-22 DIAGNOSIS — D631 Anemia in chronic kidney disease: Secondary | ICD-10-CM | POA: Diagnosis not present

## 2015-12-24 DIAGNOSIS — N2581 Secondary hyperparathyroidism of renal origin: Secondary | ICD-10-CM | POA: Diagnosis not present

## 2015-12-24 DIAGNOSIS — N186 End stage renal disease: Secondary | ICD-10-CM | POA: Diagnosis not present

## 2015-12-24 DIAGNOSIS — D631 Anemia in chronic kidney disease: Secondary | ICD-10-CM | POA: Diagnosis not present

## 2015-12-24 DIAGNOSIS — D509 Iron deficiency anemia, unspecified: Secondary | ICD-10-CM | POA: Diagnosis not present

## 2015-12-26 DIAGNOSIS — Z992 Dependence on renal dialysis: Secondary | ICD-10-CM | POA: Diagnosis not present

## 2015-12-26 DIAGNOSIS — N186 End stage renal disease: Secondary | ICD-10-CM | POA: Diagnosis not present

## 2015-12-26 DIAGNOSIS — I120 Hypertensive chronic kidney disease with stage 5 chronic kidney disease or end stage renal disease: Secondary | ICD-10-CM | POA: Diagnosis not present

## 2015-12-27 DIAGNOSIS — N2581 Secondary hyperparathyroidism of renal origin: Secondary | ICD-10-CM | POA: Diagnosis not present

## 2015-12-27 DIAGNOSIS — D509 Iron deficiency anemia, unspecified: Secondary | ICD-10-CM | POA: Diagnosis not present

## 2015-12-27 DIAGNOSIS — N186 End stage renal disease: Secondary | ICD-10-CM | POA: Diagnosis not present

## 2015-12-27 DIAGNOSIS — D631 Anemia in chronic kidney disease: Secondary | ICD-10-CM | POA: Diagnosis not present

## 2015-12-29 DIAGNOSIS — N2581 Secondary hyperparathyroidism of renal origin: Secondary | ICD-10-CM | POA: Diagnosis not present

## 2015-12-29 DIAGNOSIS — D631 Anemia in chronic kidney disease: Secondary | ICD-10-CM | POA: Diagnosis not present

## 2015-12-29 DIAGNOSIS — D509 Iron deficiency anemia, unspecified: Secondary | ICD-10-CM | POA: Diagnosis not present

## 2015-12-29 DIAGNOSIS — N186 End stage renal disease: Secondary | ICD-10-CM | POA: Diagnosis not present

## 2015-12-31 DIAGNOSIS — N186 End stage renal disease: Secondary | ICD-10-CM | POA: Diagnosis not present

## 2015-12-31 DIAGNOSIS — D509 Iron deficiency anemia, unspecified: Secondary | ICD-10-CM | POA: Diagnosis not present

## 2015-12-31 DIAGNOSIS — D631 Anemia in chronic kidney disease: Secondary | ICD-10-CM | POA: Diagnosis not present

## 2015-12-31 DIAGNOSIS — N2581 Secondary hyperparathyroidism of renal origin: Secondary | ICD-10-CM | POA: Diagnosis not present

## 2016-01-03 DIAGNOSIS — D509 Iron deficiency anemia, unspecified: Secondary | ICD-10-CM | POA: Diagnosis not present

## 2016-01-03 DIAGNOSIS — N2581 Secondary hyperparathyroidism of renal origin: Secondary | ICD-10-CM | POA: Diagnosis not present

## 2016-01-03 DIAGNOSIS — D631 Anemia in chronic kidney disease: Secondary | ICD-10-CM | POA: Diagnosis not present

## 2016-01-03 DIAGNOSIS — N186 End stage renal disease: Secondary | ICD-10-CM | POA: Diagnosis not present

## 2016-01-04 DIAGNOSIS — E221 Hyperprolactinemia: Secondary | ICD-10-CM | POA: Diagnosis not present

## 2016-01-05 DIAGNOSIS — D631 Anemia in chronic kidney disease: Secondary | ICD-10-CM | POA: Diagnosis not present

## 2016-01-05 DIAGNOSIS — N2581 Secondary hyperparathyroidism of renal origin: Secondary | ICD-10-CM | POA: Diagnosis not present

## 2016-01-05 DIAGNOSIS — N186 End stage renal disease: Secondary | ICD-10-CM | POA: Diagnosis not present

## 2016-01-05 DIAGNOSIS — D509 Iron deficiency anemia, unspecified: Secondary | ICD-10-CM | POA: Diagnosis not present

## 2016-01-06 DIAGNOSIS — E221 Hyperprolactinemia: Secondary | ICD-10-CM | POA: Diagnosis not present

## 2016-01-06 DIAGNOSIS — E291 Testicular hypofunction: Secondary | ICD-10-CM | POA: Diagnosis not present

## 2016-01-06 DIAGNOSIS — N186 End stage renal disease: Secondary | ICD-10-CM | POA: Diagnosis not present

## 2016-01-07 DIAGNOSIS — N2581 Secondary hyperparathyroidism of renal origin: Secondary | ICD-10-CM | POA: Diagnosis not present

## 2016-01-07 DIAGNOSIS — D631 Anemia in chronic kidney disease: Secondary | ICD-10-CM | POA: Diagnosis not present

## 2016-01-07 DIAGNOSIS — D509 Iron deficiency anemia, unspecified: Secondary | ICD-10-CM | POA: Diagnosis not present

## 2016-01-07 DIAGNOSIS — N186 End stage renal disease: Secondary | ICD-10-CM | POA: Diagnosis not present

## 2016-01-10 DIAGNOSIS — N186 End stage renal disease: Secondary | ICD-10-CM | POA: Diagnosis not present

## 2016-01-10 DIAGNOSIS — D509 Iron deficiency anemia, unspecified: Secondary | ICD-10-CM | POA: Diagnosis not present

## 2016-01-10 DIAGNOSIS — N2581 Secondary hyperparathyroidism of renal origin: Secondary | ICD-10-CM | POA: Diagnosis not present

## 2016-01-10 DIAGNOSIS — D631 Anemia in chronic kidney disease: Secondary | ICD-10-CM | POA: Diagnosis not present

## 2016-01-12 DIAGNOSIS — N2581 Secondary hyperparathyroidism of renal origin: Secondary | ICD-10-CM | POA: Diagnosis not present

## 2016-01-12 DIAGNOSIS — D631 Anemia in chronic kidney disease: Secondary | ICD-10-CM | POA: Diagnosis not present

## 2016-01-12 DIAGNOSIS — N186 End stage renal disease: Secondary | ICD-10-CM | POA: Diagnosis not present

## 2016-01-12 DIAGNOSIS — D509 Iron deficiency anemia, unspecified: Secondary | ICD-10-CM | POA: Diagnosis not present

## 2016-01-14 DIAGNOSIS — D631 Anemia in chronic kidney disease: Secondary | ICD-10-CM | POA: Diagnosis not present

## 2016-01-14 DIAGNOSIS — N186 End stage renal disease: Secondary | ICD-10-CM | POA: Diagnosis not present

## 2016-01-14 DIAGNOSIS — D509 Iron deficiency anemia, unspecified: Secondary | ICD-10-CM | POA: Diagnosis not present

## 2016-01-14 DIAGNOSIS — N2581 Secondary hyperparathyroidism of renal origin: Secondary | ICD-10-CM | POA: Diagnosis not present

## 2016-01-17 DIAGNOSIS — D631 Anemia in chronic kidney disease: Secondary | ICD-10-CM | POA: Diagnosis not present

## 2016-01-17 DIAGNOSIS — D509 Iron deficiency anemia, unspecified: Secondary | ICD-10-CM | POA: Diagnosis not present

## 2016-01-17 DIAGNOSIS — N186 End stage renal disease: Secondary | ICD-10-CM | POA: Diagnosis not present

## 2016-01-17 DIAGNOSIS — N2581 Secondary hyperparathyroidism of renal origin: Secondary | ICD-10-CM | POA: Diagnosis not present

## 2016-01-19 DIAGNOSIS — D509 Iron deficiency anemia, unspecified: Secondary | ICD-10-CM | POA: Diagnosis not present

## 2016-01-19 DIAGNOSIS — N186 End stage renal disease: Secondary | ICD-10-CM | POA: Diagnosis not present

## 2016-01-19 DIAGNOSIS — D631 Anemia in chronic kidney disease: Secondary | ICD-10-CM | POA: Diagnosis not present

## 2016-01-19 DIAGNOSIS — N2581 Secondary hyperparathyroidism of renal origin: Secondary | ICD-10-CM | POA: Diagnosis not present

## 2016-01-21 DIAGNOSIS — D631 Anemia in chronic kidney disease: Secondary | ICD-10-CM | POA: Diagnosis not present

## 2016-01-21 DIAGNOSIS — N186 End stage renal disease: Secondary | ICD-10-CM | POA: Diagnosis not present

## 2016-01-21 DIAGNOSIS — D509 Iron deficiency anemia, unspecified: Secondary | ICD-10-CM | POA: Diagnosis not present

## 2016-01-21 DIAGNOSIS — N2581 Secondary hyperparathyroidism of renal origin: Secondary | ICD-10-CM | POA: Diagnosis not present

## 2016-01-24 DIAGNOSIS — D631 Anemia in chronic kidney disease: Secondary | ICD-10-CM | POA: Diagnosis not present

## 2016-01-24 DIAGNOSIS — N2581 Secondary hyperparathyroidism of renal origin: Secondary | ICD-10-CM | POA: Diagnosis not present

## 2016-01-24 DIAGNOSIS — N186 End stage renal disease: Secondary | ICD-10-CM | POA: Diagnosis not present

## 2016-01-24 DIAGNOSIS — D509 Iron deficiency anemia, unspecified: Secondary | ICD-10-CM | POA: Diagnosis not present

## 2016-01-26 DIAGNOSIS — D509 Iron deficiency anemia, unspecified: Secondary | ICD-10-CM | POA: Diagnosis not present

## 2016-01-26 DIAGNOSIS — I120 Hypertensive chronic kidney disease with stage 5 chronic kidney disease or end stage renal disease: Secondary | ICD-10-CM | POA: Diagnosis not present

## 2016-01-26 DIAGNOSIS — N2581 Secondary hyperparathyroidism of renal origin: Secondary | ICD-10-CM | POA: Diagnosis not present

## 2016-01-26 DIAGNOSIS — D631 Anemia in chronic kidney disease: Secondary | ICD-10-CM | POA: Diagnosis not present

## 2016-01-26 DIAGNOSIS — Z992 Dependence on renal dialysis: Secondary | ICD-10-CM | POA: Diagnosis not present

## 2016-01-26 DIAGNOSIS — N186 End stage renal disease: Secondary | ICD-10-CM | POA: Diagnosis not present

## 2016-01-28 DIAGNOSIS — N2581 Secondary hyperparathyroidism of renal origin: Secondary | ICD-10-CM | POA: Diagnosis not present

## 2016-01-28 DIAGNOSIS — N186 End stage renal disease: Secondary | ICD-10-CM | POA: Diagnosis not present

## 2016-01-28 DIAGNOSIS — D509 Iron deficiency anemia, unspecified: Secondary | ICD-10-CM | POA: Diagnosis not present

## 2016-01-28 DIAGNOSIS — D631 Anemia in chronic kidney disease: Secondary | ICD-10-CM | POA: Diagnosis not present

## 2016-01-31 DIAGNOSIS — D631 Anemia in chronic kidney disease: Secondary | ICD-10-CM | POA: Diagnosis not present

## 2016-01-31 DIAGNOSIS — N2581 Secondary hyperparathyroidism of renal origin: Secondary | ICD-10-CM | POA: Diagnosis not present

## 2016-01-31 DIAGNOSIS — N186 End stage renal disease: Secondary | ICD-10-CM | POA: Diagnosis not present

## 2016-01-31 DIAGNOSIS — D509 Iron deficiency anemia, unspecified: Secondary | ICD-10-CM | POA: Diagnosis not present

## 2016-02-02 DIAGNOSIS — D509 Iron deficiency anemia, unspecified: Secondary | ICD-10-CM | POA: Diagnosis not present

## 2016-02-02 DIAGNOSIS — N2581 Secondary hyperparathyroidism of renal origin: Secondary | ICD-10-CM | POA: Diagnosis not present

## 2016-02-02 DIAGNOSIS — D631 Anemia in chronic kidney disease: Secondary | ICD-10-CM | POA: Diagnosis not present

## 2016-02-02 DIAGNOSIS — N186 End stage renal disease: Secondary | ICD-10-CM | POA: Diagnosis not present

## 2016-02-04 DIAGNOSIS — D631 Anemia in chronic kidney disease: Secondary | ICD-10-CM | POA: Diagnosis not present

## 2016-02-04 DIAGNOSIS — N186 End stage renal disease: Secondary | ICD-10-CM | POA: Diagnosis not present

## 2016-02-04 DIAGNOSIS — N2581 Secondary hyperparathyroidism of renal origin: Secondary | ICD-10-CM | POA: Diagnosis not present

## 2016-02-04 DIAGNOSIS — D509 Iron deficiency anemia, unspecified: Secondary | ICD-10-CM | POA: Diagnosis not present

## 2016-02-07 DIAGNOSIS — N186 End stage renal disease: Secondary | ICD-10-CM | POA: Diagnosis not present

## 2016-02-07 DIAGNOSIS — D509 Iron deficiency anemia, unspecified: Secondary | ICD-10-CM | POA: Diagnosis not present

## 2016-02-07 DIAGNOSIS — D631 Anemia in chronic kidney disease: Secondary | ICD-10-CM | POA: Diagnosis not present

## 2016-02-07 DIAGNOSIS — N2581 Secondary hyperparathyroidism of renal origin: Secondary | ICD-10-CM | POA: Diagnosis not present

## 2016-02-09 DIAGNOSIS — N186 End stage renal disease: Secondary | ICD-10-CM | POA: Diagnosis not present

## 2016-02-09 DIAGNOSIS — D631 Anemia in chronic kidney disease: Secondary | ICD-10-CM | POA: Diagnosis not present

## 2016-02-09 DIAGNOSIS — N2581 Secondary hyperparathyroidism of renal origin: Secondary | ICD-10-CM | POA: Diagnosis not present

## 2016-02-09 DIAGNOSIS — D509 Iron deficiency anemia, unspecified: Secondary | ICD-10-CM | POA: Diagnosis not present

## 2016-02-11 DIAGNOSIS — D631 Anemia in chronic kidney disease: Secondary | ICD-10-CM | POA: Diagnosis not present

## 2016-02-11 DIAGNOSIS — N186 End stage renal disease: Secondary | ICD-10-CM | POA: Diagnosis not present

## 2016-02-11 DIAGNOSIS — N2581 Secondary hyperparathyroidism of renal origin: Secondary | ICD-10-CM | POA: Diagnosis not present

## 2016-02-11 DIAGNOSIS — D509 Iron deficiency anemia, unspecified: Secondary | ICD-10-CM | POA: Diagnosis not present

## 2016-02-14 DIAGNOSIS — N2581 Secondary hyperparathyroidism of renal origin: Secondary | ICD-10-CM | POA: Diagnosis not present

## 2016-02-14 DIAGNOSIS — D631 Anemia in chronic kidney disease: Secondary | ICD-10-CM | POA: Diagnosis not present

## 2016-02-14 DIAGNOSIS — N186 End stage renal disease: Secondary | ICD-10-CM | POA: Diagnosis not present

## 2016-02-14 DIAGNOSIS — D509 Iron deficiency anemia, unspecified: Secondary | ICD-10-CM | POA: Diagnosis not present

## 2016-02-16 DIAGNOSIS — D631 Anemia in chronic kidney disease: Secondary | ICD-10-CM | POA: Diagnosis not present

## 2016-02-16 DIAGNOSIS — N2581 Secondary hyperparathyroidism of renal origin: Secondary | ICD-10-CM | POA: Diagnosis not present

## 2016-02-16 DIAGNOSIS — N186 End stage renal disease: Secondary | ICD-10-CM | POA: Diagnosis not present

## 2016-02-16 DIAGNOSIS — D509 Iron deficiency anemia, unspecified: Secondary | ICD-10-CM | POA: Diagnosis not present

## 2016-02-18 DIAGNOSIS — D509 Iron deficiency anemia, unspecified: Secondary | ICD-10-CM | POA: Diagnosis not present

## 2016-02-18 DIAGNOSIS — N186 End stage renal disease: Secondary | ICD-10-CM | POA: Diagnosis not present

## 2016-02-18 DIAGNOSIS — N2581 Secondary hyperparathyroidism of renal origin: Secondary | ICD-10-CM | POA: Diagnosis not present

## 2016-02-18 DIAGNOSIS — D631 Anemia in chronic kidney disease: Secondary | ICD-10-CM | POA: Diagnosis not present

## 2016-02-21 DIAGNOSIS — N186 End stage renal disease: Secondary | ICD-10-CM | POA: Diagnosis not present

## 2016-02-21 DIAGNOSIS — D509 Iron deficiency anemia, unspecified: Secondary | ICD-10-CM | POA: Diagnosis not present

## 2016-02-21 DIAGNOSIS — N2581 Secondary hyperparathyroidism of renal origin: Secondary | ICD-10-CM | POA: Diagnosis not present

## 2016-02-21 DIAGNOSIS — D631 Anemia in chronic kidney disease: Secondary | ICD-10-CM | POA: Diagnosis not present

## 2016-02-23 DIAGNOSIS — N186 End stage renal disease: Secondary | ICD-10-CM | POA: Diagnosis not present

## 2016-02-23 DIAGNOSIS — D631 Anemia in chronic kidney disease: Secondary | ICD-10-CM | POA: Diagnosis not present

## 2016-02-23 DIAGNOSIS — N2581 Secondary hyperparathyroidism of renal origin: Secondary | ICD-10-CM | POA: Diagnosis not present

## 2016-02-23 DIAGNOSIS — D509 Iron deficiency anemia, unspecified: Secondary | ICD-10-CM | POA: Diagnosis not present

## 2016-02-25 DIAGNOSIS — D509 Iron deficiency anemia, unspecified: Secondary | ICD-10-CM | POA: Diagnosis not present

## 2016-02-25 DIAGNOSIS — N186 End stage renal disease: Secondary | ICD-10-CM | POA: Diagnosis not present

## 2016-02-25 DIAGNOSIS — Z992 Dependence on renal dialysis: Secondary | ICD-10-CM | POA: Diagnosis not present

## 2016-02-25 DIAGNOSIS — I120 Hypertensive chronic kidney disease with stage 5 chronic kidney disease or end stage renal disease: Secondary | ICD-10-CM | POA: Diagnosis not present

## 2016-02-25 DIAGNOSIS — D631 Anemia in chronic kidney disease: Secondary | ICD-10-CM | POA: Diagnosis not present

## 2016-02-25 DIAGNOSIS — N2581 Secondary hyperparathyroidism of renal origin: Secondary | ICD-10-CM | POA: Diagnosis not present

## 2016-02-28 DIAGNOSIS — D631 Anemia in chronic kidney disease: Secondary | ICD-10-CM | POA: Diagnosis not present

## 2016-02-28 DIAGNOSIS — D509 Iron deficiency anemia, unspecified: Secondary | ICD-10-CM | POA: Diagnosis not present

## 2016-02-28 DIAGNOSIS — N2581 Secondary hyperparathyroidism of renal origin: Secondary | ICD-10-CM | POA: Diagnosis not present

## 2016-02-28 DIAGNOSIS — N186 End stage renal disease: Secondary | ICD-10-CM | POA: Diagnosis not present

## 2016-03-01 DIAGNOSIS — N186 End stage renal disease: Secondary | ICD-10-CM | POA: Diagnosis not present

## 2016-03-01 DIAGNOSIS — N2581 Secondary hyperparathyroidism of renal origin: Secondary | ICD-10-CM | POA: Diagnosis not present

## 2016-03-01 DIAGNOSIS — D631 Anemia in chronic kidney disease: Secondary | ICD-10-CM | POA: Diagnosis not present

## 2016-03-01 DIAGNOSIS — D509 Iron deficiency anemia, unspecified: Secondary | ICD-10-CM | POA: Diagnosis not present

## 2016-03-03 DIAGNOSIS — D509 Iron deficiency anemia, unspecified: Secondary | ICD-10-CM | POA: Diagnosis not present

## 2016-03-03 DIAGNOSIS — N186 End stage renal disease: Secondary | ICD-10-CM | POA: Diagnosis not present

## 2016-03-03 DIAGNOSIS — N2581 Secondary hyperparathyroidism of renal origin: Secondary | ICD-10-CM | POA: Diagnosis not present

## 2016-03-03 DIAGNOSIS — D631 Anemia in chronic kidney disease: Secondary | ICD-10-CM | POA: Diagnosis not present

## 2016-03-06 DIAGNOSIS — N2581 Secondary hyperparathyroidism of renal origin: Secondary | ICD-10-CM | POA: Diagnosis not present

## 2016-03-06 DIAGNOSIS — D631 Anemia in chronic kidney disease: Secondary | ICD-10-CM | POA: Diagnosis not present

## 2016-03-06 DIAGNOSIS — D509 Iron deficiency anemia, unspecified: Secondary | ICD-10-CM | POA: Diagnosis not present

## 2016-03-06 DIAGNOSIS — N186 End stage renal disease: Secondary | ICD-10-CM | POA: Diagnosis not present

## 2016-03-08 DIAGNOSIS — D631 Anemia in chronic kidney disease: Secondary | ICD-10-CM | POA: Diagnosis not present

## 2016-03-08 DIAGNOSIS — N186 End stage renal disease: Secondary | ICD-10-CM | POA: Diagnosis not present

## 2016-03-08 DIAGNOSIS — D509 Iron deficiency anemia, unspecified: Secondary | ICD-10-CM | POA: Diagnosis not present

## 2016-03-08 DIAGNOSIS — N2581 Secondary hyperparathyroidism of renal origin: Secondary | ICD-10-CM | POA: Diagnosis not present

## 2016-03-10 DIAGNOSIS — D631 Anemia in chronic kidney disease: Secondary | ICD-10-CM | POA: Diagnosis not present

## 2016-03-10 DIAGNOSIS — N2581 Secondary hyperparathyroidism of renal origin: Secondary | ICD-10-CM | POA: Diagnosis not present

## 2016-03-10 DIAGNOSIS — D509 Iron deficiency anemia, unspecified: Secondary | ICD-10-CM | POA: Diagnosis not present

## 2016-03-10 DIAGNOSIS — N186 End stage renal disease: Secondary | ICD-10-CM | POA: Diagnosis not present

## 2016-03-13 DIAGNOSIS — N2581 Secondary hyperparathyroidism of renal origin: Secondary | ICD-10-CM | POA: Diagnosis not present

## 2016-03-13 DIAGNOSIS — D509 Iron deficiency anemia, unspecified: Secondary | ICD-10-CM | POA: Diagnosis not present

## 2016-03-13 DIAGNOSIS — N186 End stage renal disease: Secondary | ICD-10-CM | POA: Diagnosis not present

## 2016-03-13 DIAGNOSIS — D631 Anemia in chronic kidney disease: Secondary | ICD-10-CM | POA: Diagnosis not present

## 2016-03-15 DIAGNOSIS — D631 Anemia in chronic kidney disease: Secondary | ICD-10-CM | POA: Diagnosis not present

## 2016-03-15 DIAGNOSIS — D509 Iron deficiency anemia, unspecified: Secondary | ICD-10-CM | POA: Diagnosis not present

## 2016-03-15 DIAGNOSIS — N2581 Secondary hyperparathyroidism of renal origin: Secondary | ICD-10-CM | POA: Diagnosis not present

## 2016-03-15 DIAGNOSIS — N186 End stage renal disease: Secondary | ICD-10-CM | POA: Diagnosis not present

## 2016-03-17 DIAGNOSIS — N186 End stage renal disease: Secondary | ICD-10-CM | POA: Diagnosis not present

## 2016-03-17 DIAGNOSIS — D631 Anemia in chronic kidney disease: Secondary | ICD-10-CM | POA: Diagnosis not present

## 2016-03-17 DIAGNOSIS — N2581 Secondary hyperparathyroidism of renal origin: Secondary | ICD-10-CM | POA: Diagnosis not present

## 2016-03-17 DIAGNOSIS — D509 Iron deficiency anemia, unspecified: Secondary | ICD-10-CM | POA: Diagnosis not present

## 2016-03-20 DIAGNOSIS — D509 Iron deficiency anemia, unspecified: Secondary | ICD-10-CM | POA: Diagnosis not present

## 2016-03-20 DIAGNOSIS — N2581 Secondary hyperparathyroidism of renal origin: Secondary | ICD-10-CM | POA: Diagnosis not present

## 2016-03-20 DIAGNOSIS — N186 End stage renal disease: Secondary | ICD-10-CM | POA: Diagnosis not present

## 2016-03-20 DIAGNOSIS — D631 Anemia in chronic kidney disease: Secondary | ICD-10-CM | POA: Diagnosis not present

## 2016-03-22 DIAGNOSIS — N186 End stage renal disease: Secondary | ICD-10-CM | POA: Diagnosis not present

## 2016-03-22 DIAGNOSIS — D631 Anemia in chronic kidney disease: Secondary | ICD-10-CM | POA: Diagnosis not present

## 2016-03-22 DIAGNOSIS — N2581 Secondary hyperparathyroidism of renal origin: Secondary | ICD-10-CM | POA: Diagnosis not present

## 2016-03-22 DIAGNOSIS — D509 Iron deficiency anemia, unspecified: Secondary | ICD-10-CM | POA: Diagnosis not present

## 2016-03-24 DIAGNOSIS — N2581 Secondary hyperparathyroidism of renal origin: Secondary | ICD-10-CM | POA: Diagnosis not present

## 2016-03-24 DIAGNOSIS — D631 Anemia in chronic kidney disease: Secondary | ICD-10-CM | POA: Diagnosis not present

## 2016-03-24 DIAGNOSIS — D509 Iron deficiency anemia, unspecified: Secondary | ICD-10-CM | POA: Diagnosis not present

## 2016-03-24 DIAGNOSIS — N186 End stage renal disease: Secondary | ICD-10-CM | POA: Diagnosis not present

## 2016-03-27 DIAGNOSIS — N2581 Secondary hyperparathyroidism of renal origin: Secondary | ICD-10-CM | POA: Diagnosis not present

## 2016-03-27 DIAGNOSIS — D631 Anemia in chronic kidney disease: Secondary | ICD-10-CM | POA: Diagnosis not present

## 2016-03-27 DIAGNOSIS — Z992 Dependence on renal dialysis: Secondary | ICD-10-CM | POA: Diagnosis not present

## 2016-03-27 DIAGNOSIS — D509 Iron deficiency anemia, unspecified: Secondary | ICD-10-CM | POA: Diagnosis not present

## 2016-03-27 DIAGNOSIS — I120 Hypertensive chronic kidney disease with stage 5 chronic kidney disease or end stage renal disease: Secondary | ICD-10-CM | POA: Diagnosis not present

## 2016-03-27 DIAGNOSIS — N186 End stage renal disease: Secondary | ICD-10-CM | POA: Diagnosis not present

## 2016-03-29 DIAGNOSIS — D631 Anemia in chronic kidney disease: Secondary | ICD-10-CM | POA: Diagnosis not present

## 2016-03-29 DIAGNOSIS — N186 End stage renal disease: Secondary | ICD-10-CM | POA: Diagnosis not present

## 2016-03-29 DIAGNOSIS — D509 Iron deficiency anemia, unspecified: Secondary | ICD-10-CM | POA: Diagnosis not present

## 2016-03-29 DIAGNOSIS — N2581 Secondary hyperparathyroidism of renal origin: Secondary | ICD-10-CM | POA: Diagnosis not present

## 2016-03-31 DIAGNOSIS — D631 Anemia in chronic kidney disease: Secondary | ICD-10-CM | POA: Diagnosis not present

## 2016-03-31 DIAGNOSIS — N186 End stage renal disease: Secondary | ICD-10-CM | POA: Diagnosis not present

## 2016-03-31 DIAGNOSIS — D509 Iron deficiency anemia, unspecified: Secondary | ICD-10-CM | POA: Diagnosis not present

## 2016-03-31 DIAGNOSIS — N2581 Secondary hyperparathyroidism of renal origin: Secondary | ICD-10-CM | POA: Diagnosis not present

## 2016-04-03 DIAGNOSIS — N2581 Secondary hyperparathyroidism of renal origin: Secondary | ICD-10-CM | POA: Diagnosis not present

## 2016-04-03 DIAGNOSIS — D631 Anemia in chronic kidney disease: Secondary | ICD-10-CM | POA: Diagnosis not present

## 2016-04-03 DIAGNOSIS — N186 End stage renal disease: Secondary | ICD-10-CM | POA: Diagnosis not present

## 2016-04-03 DIAGNOSIS — D509 Iron deficiency anemia, unspecified: Secondary | ICD-10-CM | POA: Diagnosis not present

## 2016-04-05 DIAGNOSIS — N186 End stage renal disease: Secondary | ICD-10-CM | POA: Diagnosis not present

## 2016-04-05 DIAGNOSIS — D509 Iron deficiency anemia, unspecified: Secondary | ICD-10-CM | POA: Diagnosis not present

## 2016-04-05 DIAGNOSIS — N2581 Secondary hyperparathyroidism of renal origin: Secondary | ICD-10-CM | POA: Diagnosis not present

## 2016-04-05 DIAGNOSIS — D631 Anemia in chronic kidney disease: Secondary | ICD-10-CM | POA: Diagnosis not present

## 2016-04-07 DIAGNOSIS — D631 Anemia in chronic kidney disease: Secondary | ICD-10-CM | POA: Diagnosis not present

## 2016-04-07 DIAGNOSIS — N2581 Secondary hyperparathyroidism of renal origin: Secondary | ICD-10-CM | POA: Diagnosis not present

## 2016-04-07 DIAGNOSIS — N186 End stage renal disease: Secondary | ICD-10-CM | POA: Diagnosis not present

## 2016-04-07 DIAGNOSIS — D509 Iron deficiency anemia, unspecified: Secondary | ICD-10-CM | POA: Diagnosis not present

## 2016-04-10 DIAGNOSIS — D631 Anemia in chronic kidney disease: Secondary | ICD-10-CM | POA: Diagnosis not present

## 2016-04-10 DIAGNOSIS — N2581 Secondary hyperparathyroidism of renal origin: Secondary | ICD-10-CM | POA: Diagnosis not present

## 2016-04-10 DIAGNOSIS — D509 Iron deficiency anemia, unspecified: Secondary | ICD-10-CM | POA: Diagnosis not present

## 2016-04-10 DIAGNOSIS — N186 End stage renal disease: Secondary | ICD-10-CM | POA: Diagnosis not present

## 2016-04-12 DIAGNOSIS — D509 Iron deficiency anemia, unspecified: Secondary | ICD-10-CM | POA: Diagnosis not present

## 2016-04-12 DIAGNOSIS — N2581 Secondary hyperparathyroidism of renal origin: Secondary | ICD-10-CM | POA: Diagnosis not present

## 2016-04-12 DIAGNOSIS — D631 Anemia in chronic kidney disease: Secondary | ICD-10-CM | POA: Diagnosis not present

## 2016-04-12 DIAGNOSIS — N186 End stage renal disease: Secondary | ICD-10-CM | POA: Diagnosis not present

## 2016-04-14 DIAGNOSIS — N2581 Secondary hyperparathyroidism of renal origin: Secondary | ICD-10-CM | POA: Diagnosis not present

## 2016-04-14 DIAGNOSIS — N186 End stage renal disease: Secondary | ICD-10-CM | POA: Diagnosis not present

## 2016-04-14 DIAGNOSIS — D631 Anemia in chronic kidney disease: Secondary | ICD-10-CM | POA: Diagnosis not present

## 2016-04-14 DIAGNOSIS — D509 Iron deficiency anemia, unspecified: Secondary | ICD-10-CM | POA: Diagnosis not present

## 2016-04-17 DIAGNOSIS — N186 End stage renal disease: Secondary | ICD-10-CM | POA: Diagnosis not present

## 2016-04-17 DIAGNOSIS — N2581 Secondary hyperparathyroidism of renal origin: Secondary | ICD-10-CM | POA: Diagnosis not present

## 2016-04-17 DIAGNOSIS — D631 Anemia in chronic kidney disease: Secondary | ICD-10-CM | POA: Diagnosis not present

## 2016-04-17 DIAGNOSIS — D509 Iron deficiency anemia, unspecified: Secondary | ICD-10-CM | POA: Diagnosis not present

## 2016-04-19 DIAGNOSIS — N2581 Secondary hyperparathyroidism of renal origin: Secondary | ICD-10-CM | POA: Diagnosis not present

## 2016-04-19 DIAGNOSIS — D631 Anemia in chronic kidney disease: Secondary | ICD-10-CM | POA: Diagnosis not present

## 2016-04-19 DIAGNOSIS — D509 Iron deficiency anemia, unspecified: Secondary | ICD-10-CM | POA: Diagnosis not present

## 2016-04-19 DIAGNOSIS — N186 End stage renal disease: Secondary | ICD-10-CM | POA: Diagnosis not present

## 2016-04-21 DIAGNOSIS — D509 Iron deficiency anemia, unspecified: Secondary | ICD-10-CM | POA: Diagnosis not present

## 2016-04-21 DIAGNOSIS — D631 Anemia in chronic kidney disease: Secondary | ICD-10-CM | POA: Diagnosis not present

## 2016-04-21 DIAGNOSIS — N2581 Secondary hyperparathyroidism of renal origin: Secondary | ICD-10-CM | POA: Diagnosis not present

## 2016-04-21 DIAGNOSIS — N186 End stage renal disease: Secondary | ICD-10-CM | POA: Diagnosis not present

## 2016-04-24 DIAGNOSIS — N2581 Secondary hyperparathyroidism of renal origin: Secondary | ICD-10-CM | POA: Diagnosis not present

## 2016-04-24 DIAGNOSIS — D509 Iron deficiency anemia, unspecified: Secondary | ICD-10-CM | POA: Diagnosis not present

## 2016-04-24 DIAGNOSIS — N186 End stage renal disease: Secondary | ICD-10-CM | POA: Diagnosis not present

## 2016-04-24 DIAGNOSIS — D631 Anemia in chronic kidney disease: Secondary | ICD-10-CM | POA: Diagnosis not present

## 2016-04-26 DIAGNOSIS — D631 Anemia in chronic kidney disease: Secondary | ICD-10-CM | POA: Diagnosis not present

## 2016-04-26 DIAGNOSIS — D509 Iron deficiency anemia, unspecified: Secondary | ICD-10-CM | POA: Diagnosis not present

## 2016-04-26 DIAGNOSIS — N186 End stage renal disease: Secondary | ICD-10-CM | POA: Diagnosis not present

## 2016-04-26 DIAGNOSIS — N2581 Secondary hyperparathyroidism of renal origin: Secondary | ICD-10-CM | POA: Diagnosis not present

## 2016-04-27 DIAGNOSIS — N186 End stage renal disease: Secondary | ICD-10-CM | POA: Diagnosis not present

## 2016-04-27 DIAGNOSIS — I120 Hypertensive chronic kidney disease with stage 5 chronic kidney disease or end stage renal disease: Secondary | ICD-10-CM | POA: Diagnosis not present

## 2016-04-27 DIAGNOSIS — Z992 Dependence on renal dialysis: Secondary | ICD-10-CM | POA: Diagnosis not present

## 2016-04-28 DIAGNOSIS — N186 End stage renal disease: Secondary | ICD-10-CM | POA: Diagnosis not present

## 2016-05-01 DIAGNOSIS — Z23 Encounter for immunization: Secondary | ICD-10-CM | POA: Diagnosis not present

## 2016-05-01 DIAGNOSIS — N2581 Secondary hyperparathyroidism of renal origin: Secondary | ICD-10-CM | POA: Diagnosis not present

## 2016-05-01 DIAGNOSIS — D631 Anemia in chronic kidney disease: Secondary | ICD-10-CM | POA: Diagnosis not present

## 2016-05-01 DIAGNOSIS — N186 End stage renal disease: Secondary | ICD-10-CM | POA: Diagnosis not present

## 2016-05-03 DIAGNOSIS — N2581 Secondary hyperparathyroidism of renal origin: Secondary | ICD-10-CM | POA: Diagnosis not present

## 2016-05-03 DIAGNOSIS — N186 End stage renal disease: Secondary | ICD-10-CM | POA: Diagnosis not present

## 2016-05-03 DIAGNOSIS — Z23 Encounter for immunization: Secondary | ICD-10-CM | POA: Diagnosis not present

## 2016-05-03 DIAGNOSIS — D631 Anemia in chronic kidney disease: Secondary | ICD-10-CM | POA: Diagnosis not present

## 2016-05-05 DIAGNOSIS — D631 Anemia in chronic kidney disease: Secondary | ICD-10-CM | POA: Diagnosis not present

## 2016-05-05 DIAGNOSIS — Z23 Encounter for immunization: Secondary | ICD-10-CM | POA: Diagnosis not present

## 2016-05-05 DIAGNOSIS — N186 End stage renal disease: Secondary | ICD-10-CM | POA: Diagnosis not present

## 2016-05-05 DIAGNOSIS — N2581 Secondary hyperparathyroidism of renal origin: Secondary | ICD-10-CM | POA: Diagnosis not present

## 2016-05-08 DIAGNOSIS — D631 Anemia in chronic kidney disease: Secondary | ICD-10-CM | POA: Diagnosis not present

## 2016-05-08 DIAGNOSIS — N2581 Secondary hyperparathyroidism of renal origin: Secondary | ICD-10-CM | POA: Diagnosis not present

## 2016-05-08 DIAGNOSIS — Z23 Encounter for immunization: Secondary | ICD-10-CM | POA: Diagnosis not present

## 2016-05-08 DIAGNOSIS — N186 End stage renal disease: Secondary | ICD-10-CM | POA: Diagnosis not present

## 2016-05-10 DIAGNOSIS — D631 Anemia in chronic kidney disease: Secondary | ICD-10-CM | POA: Diagnosis not present

## 2016-05-10 DIAGNOSIS — Z23 Encounter for immunization: Secondary | ICD-10-CM | POA: Diagnosis not present

## 2016-05-10 DIAGNOSIS — N2581 Secondary hyperparathyroidism of renal origin: Secondary | ICD-10-CM | POA: Diagnosis not present

## 2016-05-10 DIAGNOSIS — N186 End stage renal disease: Secondary | ICD-10-CM | POA: Diagnosis not present

## 2016-05-12 DIAGNOSIS — N186 End stage renal disease: Secondary | ICD-10-CM | POA: Diagnosis not present

## 2016-05-12 DIAGNOSIS — D631 Anemia in chronic kidney disease: Secondary | ICD-10-CM | POA: Diagnosis not present

## 2016-05-12 DIAGNOSIS — N2581 Secondary hyperparathyroidism of renal origin: Secondary | ICD-10-CM | POA: Diagnosis not present

## 2016-05-12 DIAGNOSIS — Z23 Encounter for immunization: Secondary | ICD-10-CM | POA: Diagnosis not present

## 2016-05-15 DIAGNOSIS — D631 Anemia in chronic kidney disease: Secondary | ICD-10-CM | POA: Diagnosis not present

## 2016-05-15 DIAGNOSIS — Z23 Encounter for immunization: Secondary | ICD-10-CM | POA: Diagnosis not present

## 2016-05-15 DIAGNOSIS — N2581 Secondary hyperparathyroidism of renal origin: Secondary | ICD-10-CM | POA: Diagnosis not present

## 2016-05-15 DIAGNOSIS — N186 End stage renal disease: Secondary | ICD-10-CM | POA: Diagnosis not present

## 2016-05-17 DIAGNOSIS — N186 End stage renal disease: Secondary | ICD-10-CM | POA: Diagnosis not present

## 2016-05-17 DIAGNOSIS — N2581 Secondary hyperparathyroidism of renal origin: Secondary | ICD-10-CM | POA: Diagnosis not present

## 2016-05-17 DIAGNOSIS — D631 Anemia in chronic kidney disease: Secondary | ICD-10-CM | POA: Diagnosis not present

## 2016-05-17 DIAGNOSIS — Z23 Encounter for immunization: Secondary | ICD-10-CM | POA: Diagnosis not present

## 2016-05-19 DIAGNOSIS — N186 End stage renal disease: Secondary | ICD-10-CM | POA: Diagnosis not present

## 2016-05-19 DIAGNOSIS — N2581 Secondary hyperparathyroidism of renal origin: Secondary | ICD-10-CM | POA: Diagnosis not present

## 2016-05-19 DIAGNOSIS — D631 Anemia in chronic kidney disease: Secondary | ICD-10-CM | POA: Diagnosis not present

## 2016-05-19 DIAGNOSIS — Z23 Encounter for immunization: Secondary | ICD-10-CM | POA: Diagnosis not present

## 2016-05-22 DIAGNOSIS — N2581 Secondary hyperparathyroidism of renal origin: Secondary | ICD-10-CM | POA: Diagnosis not present

## 2016-05-22 DIAGNOSIS — D631 Anemia in chronic kidney disease: Secondary | ICD-10-CM | POA: Diagnosis not present

## 2016-05-22 DIAGNOSIS — N186 End stage renal disease: Secondary | ICD-10-CM | POA: Diagnosis not present

## 2016-05-22 DIAGNOSIS — Z23 Encounter for immunization: Secondary | ICD-10-CM | POA: Diagnosis not present

## 2016-05-24 DIAGNOSIS — D631 Anemia in chronic kidney disease: Secondary | ICD-10-CM | POA: Diagnosis not present

## 2016-05-24 DIAGNOSIS — N2581 Secondary hyperparathyroidism of renal origin: Secondary | ICD-10-CM | POA: Diagnosis not present

## 2016-05-24 DIAGNOSIS — Z23 Encounter for immunization: Secondary | ICD-10-CM | POA: Diagnosis not present

## 2016-05-24 DIAGNOSIS — N186 End stage renal disease: Secondary | ICD-10-CM | POA: Diagnosis not present

## 2016-05-26 DIAGNOSIS — N2581 Secondary hyperparathyroidism of renal origin: Secondary | ICD-10-CM | POA: Diagnosis not present

## 2016-05-26 DIAGNOSIS — Z23 Encounter for immunization: Secondary | ICD-10-CM | POA: Diagnosis not present

## 2016-05-26 DIAGNOSIS — N186 End stage renal disease: Secondary | ICD-10-CM | POA: Diagnosis not present

## 2016-05-26 DIAGNOSIS — D631 Anemia in chronic kidney disease: Secondary | ICD-10-CM | POA: Diagnosis not present

## 2016-05-27 DIAGNOSIS — N186 End stage renal disease: Secondary | ICD-10-CM | POA: Diagnosis not present

## 2016-05-27 DIAGNOSIS — Z992 Dependence on renal dialysis: Secondary | ICD-10-CM | POA: Diagnosis not present

## 2016-05-27 DIAGNOSIS — I120 Hypertensive chronic kidney disease with stage 5 chronic kidney disease or end stage renal disease: Secondary | ICD-10-CM | POA: Diagnosis not present

## 2016-05-29 DIAGNOSIS — D631 Anemia in chronic kidney disease: Secondary | ICD-10-CM | POA: Diagnosis not present

## 2016-05-29 DIAGNOSIS — N2581 Secondary hyperparathyroidism of renal origin: Secondary | ICD-10-CM | POA: Diagnosis not present

## 2016-05-29 DIAGNOSIS — N186 End stage renal disease: Secondary | ICD-10-CM | POA: Diagnosis not present

## 2016-05-31 DIAGNOSIS — D631 Anemia in chronic kidney disease: Secondary | ICD-10-CM | POA: Diagnosis not present

## 2016-05-31 DIAGNOSIS — N2581 Secondary hyperparathyroidism of renal origin: Secondary | ICD-10-CM | POA: Diagnosis not present

## 2016-05-31 DIAGNOSIS — N186 End stage renal disease: Secondary | ICD-10-CM | POA: Diagnosis not present

## 2016-06-02 DIAGNOSIS — D631 Anemia in chronic kidney disease: Secondary | ICD-10-CM | POA: Diagnosis not present

## 2016-06-02 DIAGNOSIS — N2581 Secondary hyperparathyroidism of renal origin: Secondary | ICD-10-CM | POA: Diagnosis not present

## 2016-06-02 DIAGNOSIS — N186 End stage renal disease: Secondary | ICD-10-CM | POA: Diagnosis not present

## 2016-06-05 DIAGNOSIS — N186 End stage renal disease: Secondary | ICD-10-CM | POA: Diagnosis not present

## 2016-06-05 DIAGNOSIS — N2581 Secondary hyperparathyroidism of renal origin: Secondary | ICD-10-CM | POA: Diagnosis not present

## 2016-06-05 DIAGNOSIS — D631 Anemia in chronic kidney disease: Secondary | ICD-10-CM | POA: Diagnosis not present

## 2016-06-07 DIAGNOSIS — D631 Anemia in chronic kidney disease: Secondary | ICD-10-CM | POA: Diagnosis not present

## 2016-06-07 DIAGNOSIS — N186 End stage renal disease: Secondary | ICD-10-CM | POA: Diagnosis not present

## 2016-06-07 DIAGNOSIS — N2581 Secondary hyperparathyroidism of renal origin: Secondary | ICD-10-CM | POA: Diagnosis not present

## 2016-06-09 DIAGNOSIS — D631 Anemia in chronic kidney disease: Secondary | ICD-10-CM | POA: Diagnosis not present

## 2016-06-09 DIAGNOSIS — N186 End stage renal disease: Secondary | ICD-10-CM | POA: Diagnosis not present

## 2016-06-09 DIAGNOSIS — N2581 Secondary hyperparathyroidism of renal origin: Secondary | ICD-10-CM | POA: Diagnosis not present

## 2016-06-12 DIAGNOSIS — N2581 Secondary hyperparathyroidism of renal origin: Secondary | ICD-10-CM | POA: Diagnosis not present

## 2016-06-12 DIAGNOSIS — N186 End stage renal disease: Secondary | ICD-10-CM | POA: Diagnosis not present

## 2016-06-12 DIAGNOSIS — D631 Anemia in chronic kidney disease: Secondary | ICD-10-CM | POA: Diagnosis not present

## 2016-06-14 DIAGNOSIS — N186 End stage renal disease: Secondary | ICD-10-CM | POA: Diagnosis not present

## 2016-06-14 DIAGNOSIS — D631 Anemia in chronic kidney disease: Secondary | ICD-10-CM | POA: Diagnosis not present

## 2016-06-14 DIAGNOSIS — N2581 Secondary hyperparathyroidism of renal origin: Secondary | ICD-10-CM | POA: Diagnosis not present

## 2016-06-16 DIAGNOSIS — D631 Anemia in chronic kidney disease: Secondary | ICD-10-CM | POA: Diagnosis not present

## 2016-06-16 DIAGNOSIS — N186 End stage renal disease: Secondary | ICD-10-CM | POA: Diagnosis not present

## 2016-06-16 DIAGNOSIS — N2581 Secondary hyperparathyroidism of renal origin: Secondary | ICD-10-CM | POA: Diagnosis not present

## 2016-06-19 DIAGNOSIS — N186 End stage renal disease: Secondary | ICD-10-CM | POA: Diagnosis not present

## 2016-06-19 DIAGNOSIS — N2581 Secondary hyperparathyroidism of renal origin: Secondary | ICD-10-CM | POA: Diagnosis not present

## 2016-06-19 DIAGNOSIS — D631 Anemia in chronic kidney disease: Secondary | ICD-10-CM | POA: Diagnosis not present

## 2016-06-21 DIAGNOSIS — N2581 Secondary hyperparathyroidism of renal origin: Secondary | ICD-10-CM | POA: Diagnosis not present

## 2016-06-21 DIAGNOSIS — N186 End stage renal disease: Secondary | ICD-10-CM | POA: Diagnosis not present

## 2016-06-21 DIAGNOSIS — D631 Anemia in chronic kidney disease: Secondary | ICD-10-CM | POA: Diagnosis not present

## 2016-06-23 DIAGNOSIS — N186 End stage renal disease: Secondary | ICD-10-CM | POA: Diagnosis not present

## 2016-06-23 DIAGNOSIS — D631 Anemia in chronic kidney disease: Secondary | ICD-10-CM | POA: Diagnosis not present

## 2016-06-23 DIAGNOSIS — N2581 Secondary hyperparathyroidism of renal origin: Secondary | ICD-10-CM | POA: Diagnosis not present

## 2016-06-26 DIAGNOSIS — D631 Anemia in chronic kidney disease: Secondary | ICD-10-CM | POA: Diagnosis not present

## 2016-06-26 DIAGNOSIS — N2581 Secondary hyperparathyroidism of renal origin: Secondary | ICD-10-CM | POA: Diagnosis not present

## 2016-06-26 DIAGNOSIS — N186 End stage renal disease: Secondary | ICD-10-CM | POA: Diagnosis not present

## 2016-06-27 DIAGNOSIS — I120 Hypertensive chronic kidney disease with stage 5 chronic kidney disease or end stage renal disease: Secondary | ICD-10-CM | POA: Diagnosis not present

## 2016-06-27 DIAGNOSIS — Z992 Dependence on renal dialysis: Secondary | ICD-10-CM | POA: Diagnosis not present

## 2016-06-27 DIAGNOSIS — N186 End stage renal disease: Secondary | ICD-10-CM | POA: Diagnosis not present

## 2016-06-28 DIAGNOSIS — D631 Anemia in chronic kidney disease: Secondary | ICD-10-CM | POA: Diagnosis not present

## 2016-06-28 DIAGNOSIS — D509 Iron deficiency anemia, unspecified: Secondary | ICD-10-CM | POA: Diagnosis not present

## 2016-06-28 DIAGNOSIS — N2581 Secondary hyperparathyroidism of renal origin: Secondary | ICD-10-CM | POA: Diagnosis not present

## 2016-06-28 DIAGNOSIS — N186 End stage renal disease: Secondary | ICD-10-CM | POA: Diagnosis not present

## 2016-06-30 DIAGNOSIS — D509 Iron deficiency anemia, unspecified: Secondary | ICD-10-CM | POA: Diagnosis not present

## 2016-06-30 DIAGNOSIS — N2581 Secondary hyperparathyroidism of renal origin: Secondary | ICD-10-CM | POA: Diagnosis not present

## 2016-06-30 DIAGNOSIS — D631 Anemia in chronic kidney disease: Secondary | ICD-10-CM | POA: Diagnosis not present

## 2016-06-30 DIAGNOSIS — N186 End stage renal disease: Secondary | ICD-10-CM | POA: Diagnosis not present

## 2016-07-03 DIAGNOSIS — N2581 Secondary hyperparathyroidism of renal origin: Secondary | ICD-10-CM | POA: Diagnosis not present

## 2016-07-03 DIAGNOSIS — N186 End stage renal disease: Secondary | ICD-10-CM | POA: Diagnosis not present

## 2016-07-03 DIAGNOSIS — D509 Iron deficiency anemia, unspecified: Secondary | ICD-10-CM | POA: Diagnosis not present

## 2016-07-03 DIAGNOSIS — D631 Anemia in chronic kidney disease: Secondary | ICD-10-CM | POA: Diagnosis not present

## 2016-07-05 DIAGNOSIS — N2581 Secondary hyperparathyroidism of renal origin: Secondary | ICD-10-CM | POA: Diagnosis not present

## 2016-07-05 DIAGNOSIS — D631 Anemia in chronic kidney disease: Secondary | ICD-10-CM | POA: Diagnosis not present

## 2016-07-05 DIAGNOSIS — N186 End stage renal disease: Secondary | ICD-10-CM | POA: Diagnosis not present

## 2016-07-05 DIAGNOSIS — D509 Iron deficiency anemia, unspecified: Secondary | ICD-10-CM | POA: Diagnosis not present

## 2016-07-06 DIAGNOSIS — T82858D Stenosis of vascular prosthetic devices, implants and grafts, subsequent encounter: Secondary | ICD-10-CM | POA: Diagnosis not present

## 2016-07-06 DIAGNOSIS — Z992 Dependence on renal dialysis: Secondary | ICD-10-CM | POA: Diagnosis not present

## 2016-07-06 DIAGNOSIS — I871 Compression of vein: Secondary | ICD-10-CM | POA: Diagnosis not present

## 2016-07-06 DIAGNOSIS — N186 End stage renal disease: Secondary | ICD-10-CM | POA: Diagnosis not present

## 2016-07-07 DIAGNOSIS — N2581 Secondary hyperparathyroidism of renal origin: Secondary | ICD-10-CM | POA: Diagnosis not present

## 2016-07-07 DIAGNOSIS — D509 Iron deficiency anemia, unspecified: Secondary | ICD-10-CM | POA: Diagnosis not present

## 2016-07-07 DIAGNOSIS — N186 End stage renal disease: Secondary | ICD-10-CM | POA: Diagnosis not present

## 2016-07-07 DIAGNOSIS — D631 Anemia in chronic kidney disease: Secondary | ICD-10-CM | POA: Diagnosis not present

## 2016-07-10 DIAGNOSIS — D631 Anemia in chronic kidney disease: Secondary | ICD-10-CM | POA: Diagnosis not present

## 2016-07-10 DIAGNOSIS — D509 Iron deficiency anemia, unspecified: Secondary | ICD-10-CM | POA: Diagnosis not present

## 2016-07-10 DIAGNOSIS — N186 End stage renal disease: Secondary | ICD-10-CM | POA: Diagnosis not present

## 2016-07-10 DIAGNOSIS — N2581 Secondary hyperparathyroidism of renal origin: Secondary | ICD-10-CM | POA: Diagnosis not present

## 2016-07-11 DIAGNOSIS — R109 Unspecified abdominal pain: Secondary | ICD-10-CM | POA: Diagnosis not present

## 2016-07-11 DIAGNOSIS — K625 Hemorrhage of anus and rectum: Secondary | ICD-10-CM | POA: Diagnosis not present

## 2016-07-11 DIAGNOSIS — M545 Low back pain: Secondary | ICD-10-CM | POA: Diagnosis not present

## 2016-07-11 DIAGNOSIS — N410 Acute prostatitis: Secondary | ICD-10-CM | POA: Diagnosis not present

## 2016-07-12 DIAGNOSIS — D631 Anemia in chronic kidney disease: Secondary | ICD-10-CM | POA: Diagnosis not present

## 2016-07-12 DIAGNOSIS — D509 Iron deficiency anemia, unspecified: Secondary | ICD-10-CM | POA: Diagnosis not present

## 2016-07-12 DIAGNOSIS — N2581 Secondary hyperparathyroidism of renal origin: Secondary | ICD-10-CM | POA: Diagnosis not present

## 2016-07-12 DIAGNOSIS — N186 End stage renal disease: Secondary | ICD-10-CM | POA: Diagnosis not present

## 2016-07-14 DIAGNOSIS — N2581 Secondary hyperparathyroidism of renal origin: Secondary | ICD-10-CM | POA: Diagnosis not present

## 2016-07-14 DIAGNOSIS — D631 Anemia in chronic kidney disease: Secondary | ICD-10-CM | POA: Diagnosis not present

## 2016-07-14 DIAGNOSIS — D509 Iron deficiency anemia, unspecified: Secondary | ICD-10-CM | POA: Diagnosis not present

## 2016-07-14 DIAGNOSIS — N186 End stage renal disease: Secondary | ICD-10-CM | POA: Diagnosis not present

## 2016-07-16 DIAGNOSIS — R1032 Left lower quadrant pain: Secondary | ICD-10-CM | POA: Diagnosis not present

## 2016-07-16 DIAGNOSIS — F17210 Nicotine dependence, cigarettes, uncomplicated: Secondary | ICD-10-CM | POA: Diagnosis present

## 2016-07-16 DIAGNOSIS — Z6832 Body mass index (BMI) 32.0-32.9, adult: Secondary | ICD-10-CM | POA: Diagnosis not present

## 2016-07-16 DIAGNOSIS — N2581 Secondary hyperparathyroidism of renal origin: Secondary | ICD-10-CM | POA: Diagnosis not present

## 2016-07-16 DIAGNOSIS — I120 Hypertensive chronic kidney disease with stage 5 chronic kidney disease or end stage renal disease: Secondary | ICD-10-CM | POA: Diagnosis not present

## 2016-07-16 DIAGNOSIS — Z6834 Body mass index (BMI) 34.0-34.9, adult: Secondary | ICD-10-CM | POA: Diagnosis not present

## 2016-07-16 DIAGNOSIS — K5721 Diverticulitis of large intestine with perforation and abscess with bleeding: Secondary | ICD-10-CM | POA: Diagnosis not present

## 2016-07-16 DIAGNOSIS — R918 Other nonspecific abnormal finding of lung field: Secondary | ICD-10-CM | POA: Diagnosis not present

## 2016-07-16 DIAGNOSIS — I10 Essential (primary) hypertension: Secondary | ICD-10-CM | POA: Diagnosis not present

## 2016-07-16 DIAGNOSIS — N186 End stage renal disease: Secondary | ICD-10-CM | POA: Diagnosis present

## 2016-07-16 DIAGNOSIS — E669 Obesity, unspecified: Secondary | ICD-10-CM | POA: Diagnosis present

## 2016-07-16 DIAGNOSIS — K5733 Diverticulitis of large intestine without perforation or abscess with bleeding: Secondary | ICD-10-CM | POA: Diagnosis not present

## 2016-07-16 DIAGNOSIS — D72829 Elevated white blood cell count, unspecified: Secondary | ICD-10-CM | POA: Diagnosis not present

## 2016-07-16 DIAGNOSIS — J449 Chronic obstructive pulmonary disease, unspecified: Secondary | ICD-10-CM | POA: Diagnosis not present

## 2016-07-16 DIAGNOSIS — K5780 Diverticulitis of intestine, part unspecified, with perforation and abscess without bleeding: Secondary | ICD-10-CM | POA: Diagnosis not present

## 2016-07-16 DIAGNOSIS — Z992 Dependence on renal dialysis: Secondary | ICD-10-CM | POA: Diagnosis not present

## 2016-07-16 DIAGNOSIS — K625 Hemorrhage of anus and rectum: Secondary | ICD-10-CM | POA: Diagnosis not present

## 2016-07-16 DIAGNOSIS — D509 Iron deficiency anemia, unspecified: Secondary | ICD-10-CM | POA: Diagnosis not present

## 2016-07-16 DIAGNOSIS — I081 Rheumatic disorders of both mitral and tricuspid valves: Secondary | ICD-10-CM | POA: Diagnosis not present

## 2016-07-16 DIAGNOSIS — D649 Anemia, unspecified: Secondary | ICD-10-CM | POA: Diagnosis not present

## 2016-07-16 DIAGNOSIS — K651 Peritoneal abscess: Secondary | ICD-10-CM | POA: Diagnosis not present

## 2016-07-16 DIAGNOSIS — I517 Cardiomegaly: Secondary | ICD-10-CM | POA: Diagnosis not present

## 2016-07-16 DIAGNOSIS — E875 Hyperkalemia: Secondary | ICD-10-CM | POA: Diagnosis present

## 2016-07-16 DIAGNOSIS — K5732 Diverticulitis of large intestine without perforation or abscess without bleeding: Secondary | ICD-10-CM | POA: Diagnosis not present

## 2016-07-16 DIAGNOSIS — D631 Anemia in chronic kidney disease: Secondary | ICD-10-CM | POA: Diagnosis not present

## 2016-07-16 DIAGNOSIS — K649 Unspecified hemorrhoids: Secondary | ICD-10-CM | POA: Diagnosis not present

## 2016-07-16 DIAGNOSIS — K5730 Diverticulosis of large intestine without perforation or abscess without bleeding: Secondary | ICD-10-CM | POA: Diagnosis not present

## 2016-07-27 DIAGNOSIS — N186 End stage renal disease: Secondary | ICD-10-CM | POA: Diagnosis not present

## 2016-07-27 DIAGNOSIS — Z992 Dependence on renal dialysis: Secondary | ICD-10-CM | POA: Diagnosis not present

## 2016-07-27 DIAGNOSIS — I120 Hypertensive chronic kidney disease with stage 5 chronic kidney disease or end stage renal disease: Secondary | ICD-10-CM | POA: Diagnosis not present

## 2016-07-28 DIAGNOSIS — D509 Iron deficiency anemia, unspecified: Secondary | ICD-10-CM | POA: Diagnosis not present

## 2016-07-28 DIAGNOSIS — D631 Anemia in chronic kidney disease: Secondary | ICD-10-CM | POA: Diagnosis not present

## 2016-07-28 DIAGNOSIS — N2581 Secondary hyperparathyroidism of renal origin: Secondary | ICD-10-CM | POA: Diagnosis not present

## 2016-07-28 DIAGNOSIS — N186 End stage renal disease: Secondary | ICD-10-CM | POA: Diagnosis not present

## 2016-07-31 DIAGNOSIS — N2581 Secondary hyperparathyroidism of renal origin: Secondary | ICD-10-CM | POA: Diagnosis not present

## 2016-07-31 DIAGNOSIS — N186 End stage renal disease: Secondary | ICD-10-CM | POA: Diagnosis not present

## 2016-07-31 DIAGNOSIS — D631 Anemia in chronic kidney disease: Secondary | ICD-10-CM | POA: Diagnosis not present

## 2016-07-31 DIAGNOSIS — D509 Iron deficiency anemia, unspecified: Secondary | ICD-10-CM | POA: Diagnosis not present

## 2016-08-02 DIAGNOSIS — K5720 Diverticulitis of large intestine with perforation and abscess without bleeding: Secondary | ICD-10-CM | POA: Diagnosis not present

## 2016-08-02 DIAGNOSIS — E669 Obesity, unspecified: Secondary | ICD-10-CM | POA: Diagnosis not present

## 2016-08-02 DIAGNOSIS — I10 Essential (primary) hypertension: Secondary | ICD-10-CM | POA: Diagnosis not present

## 2016-08-02 DIAGNOSIS — Z6832 Body mass index (BMI) 32.0-32.9, adult: Secondary | ICD-10-CM | POA: Diagnosis not present

## 2016-08-02 DIAGNOSIS — Z1389 Encounter for screening for other disorder: Secondary | ICD-10-CM | POA: Diagnosis not present

## 2016-08-02 DIAGNOSIS — N186 End stage renal disease: Secondary | ICD-10-CM | POA: Diagnosis not present

## 2016-08-02 DIAGNOSIS — N2581 Secondary hyperparathyroidism of renal origin: Secondary | ICD-10-CM | POA: Diagnosis not present

## 2016-08-02 DIAGNOSIS — D631 Anemia in chronic kidney disease: Secondary | ICD-10-CM | POA: Diagnosis not present

## 2016-08-02 DIAGNOSIS — D509 Iron deficiency anemia, unspecified: Secondary | ICD-10-CM | POA: Diagnosis not present

## 2016-08-03 DIAGNOSIS — Z8719 Personal history of other diseases of the digestive system: Secondary | ICD-10-CM | POA: Diagnosis not present

## 2016-08-04 DIAGNOSIS — D509 Iron deficiency anemia, unspecified: Secondary | ICD-10-CM | POA: Diagnosis not present

## 2016-08-04 DIAGNOSIS — N186 End stage renal disease: Secondary | ICD-10-CM | POA: Diagnosis not present

## 2016-08-04 DIAGNOSIS — D631 Anemia in chronic kidney disease: Secondary | ICD-10-CM | POA: Diagnosis not present

## 2016-08-04 DIAGNOSIS — N2581 Secondary hyperparathyroidism of renal origin: Secondary | ICD-10-CM | POA: Diagnosis not present

## 2016-08-07 DIAGNOSIS — N186 End stage renal disease: Secondary | ICD-10-CM | POA: Diagnosis not present

## 2016-08-07 DIAGNOSIS — D631 Anemia in chronic kidney disease: Secondary | ICD-10-CM | POA: Diagnosis not present

## 2016-08-07 DIAGNOSIS — N2581 Secondary hyperparathyroidism of renal origin: Secondary | ICD-10-CM | POA: Diagnosis not present

## 2016-08-07 DIAGNOSIS — D509 Iron deficiency anemia, unspecified: Secondary | ICD-10-CM | POA: Diagnosis not present

## 2016-08-09 DIAGNOSIS — D509 Iron deficiency anemia, unspecified: Secondary | ICD-10-CM | POA: Diagnosis not present

## 2016-08-09 DIAGNOSIS — N186 End stage renal disease: Secondary | ICD-10-CM | POA: Diagnosis not present

## 2016-08-09 DIAGNOSIS — N2581 Secondary hyperparathyroidism of renal origin: Secondary | ICD-10-CM | POA: Diagnosis not present

## 2016-08-09 DIAGNOSIS — D631 Anemia in chronic kidney disease: Secondary | ICD-10-CM | POA: Diagnosis not present

## 2016-08-11 DIAGNOSIS — N186 End stage renal disease: Secondary | ICD-10-CM | POA: Diagnosis not present

## 2016-08-11 DIAGNOSIS — D509 Iron deficiency anemia, unspecified: Secondary | ICD-10-CM | POA: Diagnosis not present

## 2016-08-11 DIAGNOSIS — N2581 Secondary hyperparathyroidism of renal origin: Secondary | ICD-10-CM | POA: Diagnosis not present

## 2016-08-11 DIAGNOSIS — D631 Anemia in chronic kidney disease: Secondary | ICD-10-CM | POA: Diagnosis not present

## 2016-08-14 DIAGNOSIS — D631 Anemia in chronic kidney disease: Secondary | ICD-10-CM | POA: Diagnosis not present

## 2016-08-14 DIAGNOSIS — N2581 Secondary hyperparathyroidism of renal origin: Secondary | ICD-10-CM | POA: Diagnosis not present

## 2016-08-14 DIAGNOSIS — D509 Iron deficiency anemia, unspecified: Secondary | ICD-10-CM | POA: Diagnosis not present

## 2016-08-14 DIAGNOSIS — N186 End stage renal disease: Secondary | ICD-10-CM | POA: Diagnosis not present

## 2016-08-15 ENCOUNTER — Emergency Department (HOSPITAL_COMMUNITY): Payer: Medicare Other

## 2016-08-15 ENCOUNTER — Encounter (HOSPITAL_COMMUNITY): Payer: Self-pay

## 2016-08-15 ENCOUNTER — Emergency Department (HOSPITAL_COMMUNITY)
Admission: EM | Admit: 2016-08-15 | Discharge: 2016-08-15 | Disposition: A | Discharge status: Home / Self Care | Payer: Medicare Other | Attending: Emergency Medicine | Admitting: Emergency Medicine

## 2016-08-15 DIAGNOSIS — N186 End stage renal disease: Secondary | ICD-10-CM | POA: Diagnosis not present

## 2016-08-15 DIAGNOSIS — Z992 Dependence on renal dialysis: Secondary | ICD-10-CM | POA: Insufficient documentation

## 2016-08-15 DIAGNOSIS — Z79899 Other long term (current) drug therapy: Secondary | ICD-10-CM | POA: Diagnosis not present

## 2016-08-15 DIAGNOSIS — R0602 Shortness of breath: Secondary | ICD-10-CM | POA: Diagnosis not present

## 2016-08-15 DIAGNOSIS — R079 Chest pain, unspecified: Secondary | ICD-10-CM

## 2016-08-15 DIAGNOSIS — Z87891 Personal history of nicotine dependence: Secondary | ICD-10-CM | POA: Insufficient documentation

## 2016-08-15 DIAGNOSIS — I120 Hypertensive chronic kidney disease with stage 5 chronic kidney disease or end stage renal disease: Secondary | ICD-10-CM | POA: Insufficient documentation

## 2016-08-15 LAB — BASIC METABOLIC PANEL
Anion gap: 12 (ref 5–15)
BUN: 36 mg/dL — ABNORMAL HIGH (ref 6–20)
CO2: 31 mmol/L (ref 22–32)
Calcium: 10.1 mg/dL (ref 8.9–10.3)
Chloride: 97 mmol/L — ABNORMAL LOW (ref 101–111)
Creatinine, Ser: 9.47 mg/dL — ABNORMAL HIGH (ref 0.61–1.24)
GFR calc Af Amer: 7 mL/min — ABNORMAL LOW (ref >60)
GFR calc non Af Amer: 6 mL/min — ABNORMAL LOW (ref >60)
Glucose, Bld: 96 mg/dL (ref 65–99)
Potassium: 5.3 mmol/L — ABNORMAL HIGH (ref 3.5–5.1)
Sodium: 140 mmol/L (ref 135–145)

## 2016-08-15 LAB — I-STAT TROPONIN, ED
Troponin i, poc: 0.05 ng/mL (ref 0.00–0.08)
Troponin i, poc: 0.05 ng/mL (ref 0.00–0.08)

## 2016-08-15 LAB — CBC
HCT: 27.3 % — ABNORMAL LOW (ref 39.0–52.0)
Hemoglobin: 8.9 g/dL — ABNORMAL LOW (ref 13.0–17.0)
MCH: 30.6 pg (ref 26.0–34.0)
MCHC: 32.6 g/dL (ref 30.0–36.0)
MCV: 93.8 fL (ref 78.0–100.0)
Platelets: 136 10*3/uL — ABNORMAL LOW (ref 150–400)
RBC: 2.91 MIL/uL — ABNORMAL LOW (ref 4.22–5.81)
RDW: 16.7 % — ABNORMAL HIGH (ref 11.5–15.5)
WBC: 5.8 10*3/uL (ref 4.0–10.5)

## 2016-08-15 NOTE — Discharge Planning (Signed)
Pt up for discharge. EDCM reviewed chart for possible CM needs.  No needs identified or communicated.  

## 2016-08-15 NOTE — ED Triage Notes (Signed)
Per Pt, Pt was discharged from the hospital one week ago with diverticulitis. Pt was treated., Since the stay, pt has reported increased SOB and Chest tightness. Hx of smoking, HTN, Dialysis - MWF. Pt quit smoking while in the hospital. Pt reports weight loss due to the hospital and dialysis attempting to lower dry weight.

## 2016-08-15 NOTE — ED Notes (Signed)
Placed patient on the monitor into a gown

## 2016-08-15 NOTE — ED Provider Notes (Signed)
[place] DEPT Provider Note   CSN: [id number] Arrival date & time: 08/15/16  8469   History   Chief Complaint Chief Complaint  Patient presents with  . Chest Pain  . Shortness of Breath    HPI Marco Arnold is a 50 y.o. male.  Patient with PMH of HTN, GERD, h/o tobacco abuse, and ESRD on HD presenting with one week history of shortness of breath at night. Patient states that he is compliant with HD sessions MWF and a new dry weight is being established. He went to HD day before ED visit and an extra 1.4L was taken off; he states that he took a nap afterwards and did not experience shortness of breath but it did occur last night when going to sleep. Patient states he naps in his bed with same amount of pillows as at night and no other environmental changes. He feels relief when sitting up but is unable to get to sleep in that position. Patient endorses some lower chest tightness but no pain, and does not endorse associated symptoms of nausea, vomiting, diaphoresis, headache, vision or hearing changes. He denies acid reflux symptoms, increased swelling in legs or abdomen or change in his chronic cough.   Recent stress test in Sept 2017 shows no ischemic changes. Echo on 07/17/2016 shows EF 500-55% mildly dilated LV and trivial pericardial effusion without mention of diastolic function.       Past Medical History:  Diagnosis Date  . Chronic kidney disease    not started dialysis  yet  . Hypertension    dr  Tobie Lords      @ [facility]  med    Patient Active Problem List   Diagnosis Date Noted  . Other sleep disturbances 12/05/2012  . End stage renal disease ([place]) 12/05/2011  . ESRD (end stage renal disease) ([place]) 11/11/2011  . Anemia of chronic disease 11/11/2011  . Hyperparathyroidism, secondary renal ([place]) 11/11/2011  . HTN (hypertension) 11/11/2011  . Acute kidney injury ([place]) 11/06/2011    Past Surgical History:  Procedure Laterality Date  . AV FISTULA PLACEMENT   11/10/2011   Procedure: ARTERIOVENOUS (AV) FISTULA CREATION;  Surgeon: Mal Misty, MD;  Location: [place];  Service: Vascular;  Laterality: Left;  Ultrasound guided  . INSERTION OF DIALYSIS CATHETER  02/09/2012   Procedure: INSERTION OF DIALYSIS CATHETER;  Surgeon: Mal Misty, MD;  Location: [place];  Service: Vascular;  Laterality: N/A;  . SHUNTOGRAM Left 06/03/2012   Procedure: Earney Mallet;  Surgeon: Conrad Rutland, MD;  Location: [facility] CATH LAB;  Service: Cardiovascular;  Laterality: Left;  . SHUNTOGRAM Left 01/02/2013   Procedure: SHUNTOGRAM;  Surgeon: Conrad [place], MD;  Location: [facility] CATH LAB;  Service: Cardiovascular;  Laterality: Left;       Home Medications    Prior to Admission medications   Medication Sig Start Date End Date Taking? Authorizing Provider  amLODipine (NORVASC) 10 MG tablet Take 10 mg by mouth daily. 07/13/16  Yes Historical Provider, MD  cabergoline (DOSTINEX) 0.5 MG tablet Take 2 mg by mouth See admin instructions. Twice weekly 07/13/16  Yes Historical Provider, MD  cloNIDine (CATAPRES) 0.1 MG tablet Take 0.2 mg by mouth daily. 07/13/16  Yes Historical Provider, MD  cyclobenzaprine (FLEXERIL) 10 MG tablet Take 10 mg by mouth 2 (two) times daily as needed for muscle spasms.    Yes Historical Provider, MD  gabapentin (NEURONTIN) 100 MG capsule Take 300 mg by mouth at bedtime. 07/13/16  Yes Historical Provider, MD  losartan (COZAAR) 100 MG tablet Take 100 mg by mouth daily.   Yes Historical Provider, MD  pantoprazole (PROTONIX) 40 MG tablet Take 40 mg by mouth as needed (for stomach).  12/04/12  Yes Historical Provider, MD  sevelamer carbonate (RENVELA) 800 MG tablet Take 3,200 mg by mouth 3 (three) times daily.   Yes Historical Provider, MD  sildenafil (REVATIO) 20 MG tablet Take 60 mg by mouth daily as needed.   Yes Historical Provider, MD  VELPHORO 500 MG chewable tablet Chew 2,000 mg by mouth 3 (three) times daily. 07/13/16  Yes Historical Provider, MD  amLODipine  (NORVASC) 5 MG tablet Take 5 mg by mouth daily.  10/26/12   Historical Provider, MD  calcium carbonate (TUMS - DOSED IN MG ELEMENTAL CALCIUM) 500 MG chewable tablet Chew 3 tablets by mouth 5 (five) times daily. 3 times a day With meals and with 2 snacks    Historical Provider, MD  furosemide (LASIX) 80 MG tablet Take 1 tablet by mouth as needed. With increased fluid 11/15/12   Historical Provider, MD  Multiple Vitamins-Minerals (MULTIVITAMINS THER. W/MINERALS) TABS Take 1 tablet by mouth daily.    Historical Provider, MD  rOPINIRole (REQUIP) 0.25 MG tablet Take 0.75 tablets by mouth at bedtime.  10/29/12   Historical Provider, MD    Family History Family History  Problem Relation Age of Onset  . Cancer Mother   . Diabetes Father   . Hypertension Father   . Cancer - Lung Paternal Grandfather     Social History Social History  Substance Use Topics  . Smoking status: Former Smoker    Packs/day: 1.00    Years: 35.00    Types: Cigarettes  . Smokeless tobacco: Never Used     Comment: pt states that he is trying to quit--is trying to cut back at the current time  . Alcohol use Yes     Comment: occ     Allergies   Patient has no known allergies.   Review of Systems Review of Systems  Constitutional: Negative for activity change, appetite change, chills, diaphoresis, fatigue, fever and unexpected weight change.  HENT: Negative for congestion, rhinorrhea and sinus pressure.   Eyes: Negative for visual disturbance.  Respiratory: Positive for cough (chronic unchanged with no increased sputum production), chest tightness and shortness of breath. Negative for wheezing.   Cardiovascular: Negative for chest pain and leg swelling.  Gastrointestinal: Negative for abdominal distention, abdominal pain, constipation, diarrhea, nausea and vomiting.  Musculoskeletal: Negative for arthralgias.  Skin: Negative for rash and wound.  Neurological: Negative for dizziness, tremors, speech difficulty,  weakness, light-headedness, numbness and headaches.  Psychiatric/Behavioral: Negative for behavioral problems, confusion and decreased concentration.     Physical Exam Updated Vital Signs BP 160/91   Pulse 75   Temp 97.9 F (36.6 C) (Oral)   Resp 11   Ht 6' 3.5" (1.918 m)   Wt 124.7 kg   SpO2 99%   BMI 33.92 kg/m   Physical Exam  Constitutional: He is oriented to person, place, and time. He appears well-developed and well-nourished. No distress.  HENT:  Head: Normocephalic and atraumatic.  Mouth/Throat: Oropharynx is clear and moist.  Eyes: Conjunctivae and EOM are normal. Pupils are equal, round, and reactive to light.  Neck: Normal range of motion. Neck supple.  Cardiovascular: Normal rate, regular rhythm and intact distal pulses.  Exam reveals no gallop and no friction rub.   No murmur heard. Pulmonary/Chest: Effort normal and breath sounds normal. No stridor. No  respiratory distress. He has no wheezes. He has no rales. He exhibits no tenderness.  Abdominal: Soft. Bowel sounds are normal. He exhibits distension. He exhibits no mass. There is no tenderness. There is no guarding.  Musculoskeletal: Normal range of motion. He exhibits edema (minimal LE edema). He exhibits no tenderness.  Neurological: He is alert and oriented to person, place, and time.  Skin: Skin is warm and dry. Capillary refill takes less than 2 seconds. He is not diaphoretic. No erythema.  Psychiatric: He has a normal mood and affect. His behavior is normal. Judgment and thought content normal.  Nursing note and vitals reviewed.    ED Treatments / Results  Labs (all labs ordered are listed, but only abnormal results are displayed) Labs Reviewed  BASIC METABOLIC PANEL - Abnormal; Notable for the following:       Result Value   Potassium 5.3 (*)    Chloride 97 (*)    BUN 36 (*)    Creatinine, Ser 9.47 (*)    GFR calc non Af Amer 6 (*)    GFR calc Af Amer 7 (*)    All other components within normal  limits  CBC - Abnormal; Notable for the following:    RBC 2.91 (*)    Hemoglobin 8.9 (*)    HCT 27.3 (*)    RDW 16.7 (*)    Platelets 136 (*)    All other components within normal limits  I-STAT TROPOININ, ED  I-STAT TROPOININ, ED    EKG  EKG Interpretation  Date/Time:  Tuesday August 15 2016 08:59:02 EST Ventricular Rate:  77 PR Interval:  158 QRS Duration: 152 QT Interval:  456 QTC Calculation: 516 R Axis:   -57 Text Interpretation:  Normal sinus rhythm Right bundle branch block Left anterior fascicular block * Bifascicular block * Minimal voltage criteria for LVH, may be normal variant Septal infarct , age undetermined Abnormal ECG Confirmed by [facility] MD, PEDRO [phone]) on 08/15/2016 9:11:22 AM       Radiology Dg Chest Port 1 View  Result Date: 08/15/2016 CLINICAL DATA:  Chest pain.  Shortness of breath. EXAM: PORTABLE CHEST 1 VIEW COMPARISON:  07/19/2016 FINDINGS: Borderline cardiopericardial enlargement, accentuated by technique. Perihilar interstitial cuffing and Kerley lines. Possible small left effusion. No definite air bronchogram. IMPRESSION: Interstitial process favoring mild edema over bronchitis. Electronically Signed   By: Monte Fantasia M.D.   On: 08/15/2016 10:36    Procedures Procedures (including critical care time)  Medications Ordered in ED Medications - No data to display   Initial Impression / Assessment and Plan / ED Course  I have reviewed the triage vital signs and the nursing notes.  Pertinent labs & imaging results that were available during my care of the patient were reviewed by me and considered in my medical decision making (see chart for details).  Clinical Course    Mr. Lucier is a 50yo male with PMH of HTN, GERD, ESRD on HD MWF, and h/o recent tobacco abuse presenting with shortness of breath at night. On arrival patient was afebrile, normotensive, normocardic and saturating in mid to high 90's on room air. Patient did demonstrate  shortness of breath when lying <30 degrees in bed, but maintained oxygen saturation. Exam was negative for rales or rhonchi, and he had minimal LE edema. EKG was unchanged from previous; bedside echo revealed good wall motion and stable trace pericardial effusion. CXR revealed pulmonary congestion. CBC was unremarkable compared to previous and BMP was unremarkable for ESRD -  K 5.3. In setting of multiple risk factors troponins were obtained and were negative x 2. Workup, signs and symptoms consistent with fluid overload. Patient is currently working with dialysis center and his nephrologist to establish new dry weight and has likely not reached it yet. Patient scheduled for HD tomorrow; no emergent need for dialysis earlier than tomorrow is indicated at this time.   Patient agrees with plan to discharge and f/u with HD tomorrow. Strict return precautions like increasing shortness of breath and chest pain were discussed and agreed upon with patient. On d/c patient's VS were stable. Patient had no further questions or concerns.   Final Clinical Impressions(s) / ED Diagnoses   Final diagnoses:  Shortness of breath   New Prescriptions Discharge Medication List as of 08/15/2016  1:57 PM       Alphonzo Grieve, MD 08/16/16 [place], MD 08/16/16 [phone]

## 2016-08-15 NOTE — Discharge Instructions (Signed)
We evaluated you for shortness of breath. Your workup did not reveal a heart problem at this time; your chest x ray did show some fluid. Please follow up with your regularly scheduled dialysis session tomorrow as you may be able to remove more fluid which could help your breathing.   I've provided contact info for a local cardiology group who can continue your heart work up.   If you develop chest pain, worsening shortness of breath, please come back to get re-evaluated.

## 2016-08-16 DIAGNOSIS — D509 Iron deficiency anemia, unspecified: Secondary | ICD-10-CM | POA: Diagnosis not present

## 2016-08-16 DIAGNOSIS — D631 Anemia in chronic kidney disease: Secondary | ICD-10-CM | POA: Diagnosis not present

## 2016-08-16 DIAGNOSIS — N2581 Secondary hyperparathyroidism of renal origin: Secondary | ICD-10-CM | POA: Diagnosis not present

## 2016-08-16 DIAGNOSIS — N186 End stage renal disease: Secondary | ICD-10-CM | POA: Diagnosis not present

## 2016-08-18 DIAGNOSIS — D631 Anemia in chronic kidney disease: Secondary | ICD-10-CM | POA: Diagnosis not present

## 2016-08-18 DIAGNOSIS — N186 End stage renal disease: Secondary | ICD-10-CM | POA: Diagnosis not present

## 2016-08-18 DIAGNOSIS — N2581 Secondary hyperparathyroidism of renal origin: Secondary | ICD-10-CM | POA: Diagnosis not present

## 2016-08-18 DIAGNOSIS — D509 Iron deficiency anemia, unspecified: Secondary | ICD-10-CM | POA: Diagnosis not present

## 2016-08-20 DIAGNOSIS — N2581 Secondary hyperparathyroidism of renal origin: Secondary | ICD-10-CM | POA: Diagnosis not present

## 2016-08-20 DIAGNOSIS — D509 Iron deficiency anemia, unspecified: Secondary | ICD-10-CM | POA: Diagnosis not present

## 2016-08-20 DIAGNOSIS — N186 End stage renal disease: Secondary | ICD-10-CM | POA: Diagnosis not present

## 2016-08-20 DIAGNOSIS — D631 Anemia in chronic kidney disease: Secondary | ICD-10-CM | POA: Diagnosis not present

## 2016-08-23 DIAGNOSIS — D631 Anemia in chronic kidney disease: Secondary | ICD-10-CM | POA: Diagnosis not present

## 2016-08-23 DIAGNOSIS — N2581 Secondary hyperparathyroidism of renal origin: Secondary | ICD-10-CM | POA: Diagnosis not present

## 2016-08-23 DIAGNOSIS — N186 End stage renal disease: Secondary | ICD-10-CM | POA: Diagnosis not present

## 2016-08-23 DIAGNOSIS — D509 Iron deficiency anemia, unspecified: Secondary | ICD-10-CM | POA: Diagnosis not present

## 2016-08-25 DIAGNOSIS — D631 Anemia in chronic kidney disease: Secondary | ICD-10-CM | POA: Diagnosis not present

## 2016-08-25 DIAGNOSIS — D509 Iron deficiency anemia, unspecified: Secondary | ICD-10-CM | POA: Diagnosis not present

## 2016-08-25 DIAGNOSIS — N2581 Secondary hyperparathyroidism of renal origin: Secondary | ICD-10-CM | POA: Diagnosis not present

## 2016-08-25 DIAGNOSIS — N186 End stage renal disease: Secondary | ICD-10-CM | POA: Diagnosis not present

## 2016-08-27 DIAGNOSIS — I120 Hypertensive chronic kidney disease with stage 5 chronic kidney disease or end stage renal disease: Secondary | ICD-10-CM | POA: Diagnosis not present

## 2016-08-27 DIAGNOSIS — D631 Anemia in chronic kidney disease: Secondary | ICD-10-CM | POA: Diagnosis not present

## 2016-08-27 DIAGNOSIS — N186 End stage renal disease: Secondary | ICD-10-CM | POA: Diagnosis not present

## 2016-08-27 DIAGNOSIS — D509 Iron deficiency anemia, unspecified: Secondary | ICD-10-CM | POA: Diagnosis not present

## 2016-08-27 DIAGNOSIS — Z992 Dependence on renal dialysis: Secondary | ICD-10-CM | POA: Diagnosis not present

## 2016-08-27 DIAGNOSIS — N2581 Secondary hyperparathyroidism of renal origin: Secondary | ICD-10-CM | POA: Diagnosis not present

## 2016-08-30 DIAGNOSIS — N186 End stage renal disease: Secondary | ICD-10-CM | POA: Diagnosis not present

## 2016-08-30 DIAGNOSIS — D509 Iron deficiency anemia, unspecified: Secondary | ICD-10-CM | POA: Diagnosis not present

## 2016-08-30 DIAGNOSIS — D631 Anemia in chronic kidney disease: Secondary | ICD-10-CM | POA: Diagnosis not present

## 2016-08-30 DIAGNOSIS — N2581 Secondary hyperparathyroidism of renal origin: Secondary | ICD-10-CM | POA: Diagnosis not present

## 2016-09-01 DIAGNOSIS — D509 Iron deficiency anemia, unspecified: Secondary | ICD-10-CM | POA: Diagnosis not present

## 2016-09-01 DIAGNOSIS — N186 End stage renal disease: Secondary | ICD-10-CM | POA: Diagnosis not present

## 2016-09-01 DIAGNOSIS — D631 Anemia in chronic kidney disease: Secondary | ICD-10-CM | POA: Diagnosis not present

## 2016-09-01 DIAGNOSIS — N2581 Secondary hyperparathyroidism of renal origin: Secondary | ICD-10-CM | POA: Diagnosis not present

## 2016-09-04 DIAGNOSIS — N186 End stage renal disease: Secondary | ICD-10-CM | POA: Diagnosis not present

## 2016-09-04 DIAGNOSIS — D509 Iron deficiency anemia, unspecified: Secondary | ICD-10-CM | POA: Diagnosis not present

## 2016-09-04 DIAGNOSIS — N2581 Secondary hyperparathyroidism of renal origin: Secondary | ICD-10-CM | POA: Diagnosis not present

## 2016-09-04 DIAGNOSIS — D631 Anemia in chronic kidney disease: Secondary | ICD-10-CM | POA: Diagnosis not present

## 2016-09-06 DIAGNOSIS — D631 Anemia in chronic kidney disease: Secondary | ICD-10-CM | POA: Diagnosis not present

## 2016-09-06 DIAGNOSIS — N2581 Secondary hyperparathyroidism of renal origin: Secondary | ICD-10-CM | POA: Diagnosis not present

## 2016-09-06 DIAGNOSIS — D509 Iron deficiency anemia, unspecified: Secondary | ICD-10-CM | POA: Diagnosis not present

## 2016-09-06 DIAGNOSIS — N186 End stage renal disease: Secondary | ICD-10-CM | POA: Diagnosis not present

## 2016-09-08 DIAGNOSIS — D631 Anemia in chronic kidney disease: Secondary | ICD-10-CM | POA: Diagnosis not present

## 2016-09-08 DIAGNOSIS — N2581 Secondary hyperparathyroidism of renal origin: Secondary | ICD-10-CM | POA: Diagnosis not present

## 2016-09-08 DIAGNOSIS — D509 Iron deficiency anemia, unspecified: Secondary | ICD-10-CM | POA: Diagnosis not present

## 2016-09-08 DIAGNOSIS — N186 End stage renal disease: Secondary | ICD-10-CM | POA: Diagnosis not present

## 2016-09-11 DIAGNOSIS — N2581 Secondary hyperparathyroidism of renal origin: Secondary | ICD-10-CM | POA: Diagnosis not present

## 2016-09-11 DIAGNOSIS — D509 Iron deficiency anemia, unspecified: Secondary | ICD-10-CM | POA: Diagnosis not present

## 2016-09-11 DIAGNOSIS — N186 End stage renal disease: Secondary | ICD-10-CM | POA: Diagnosis not present

## 2016-09-11 DIAGNOSIS — D631 Anemia in chronic kidney disease: Secondary | ICD-10-CM | POA: Diagnosis not present

## 2016-09-13 DIAGNOSIS — D509 Iron deficiency anemia, unspecified: Secondary | ICD-10-CM | POA: Diagnosis not present

## 2016-09-13 DIAGNOSIS — N186 End stage renal disease: Secondary | ICD-10-CM | POA: Diagnosis not present

## 2016-09-13 DIAGNOSIS — N2581 Secondary hyperparathyroidism of renal origin: Secondary | ICD-10-CM | POA: Diagnosis not present

## 2016-09-13 DIAGNOSIS — D631 Anemia in chronic kidney disease: Secondary | ICD-10-CM | POA: Diagnosis not present

## 2016-09-15 DIAGNOSIS — D509 Iron deficiency anemia, unspecified: Secondary | ICD-10-CM | POA: Diagnosis not present

## 2016-09-15 DIAGNOSIS — D631 Anemia in chronic kidney disease: Secondary | ICD-10-CM | POA: Diagnosis not present

## 2016-09-15 DIAGNOSIS — N2581 Secondary hyperparathyroidism of renal origin: Secondary | ICD-10-CM | POA: Diagnosis not present

## 2016-09-15 DIAGNOSIS — N186 End stage renal disease: Secondary | ICD-10-CM | POA: Diagnosis not present

## 2016-09-18 DIAGNOSIS — N186 End stage renal disease: Secondary | ICD-10-CM | POA: Diagnosis not present

## 2016-09-18 DIAGNOSIS — D631 Anemia in chronic kidney disease: Secondary | ICD-10-CM | POA: Diagnosis not present

## 2016-09-18 DIAGNOSIS — D509 Iron deficiency anemia, unspecified: Secondary | ICD-10-CM | POA: Diagnosis not present

## 2016-09-18 DIAGNOSIS — N2581 Secondary hyperparathyroidism of renal origin: Secondary | ICD-10-CM | POA: Diagnosis not present

## 2016-09-20 DIAGNOSIS — N2581 Secondary hyperparathyroidism of renal origin: Secondary | ICD-10-CM | POA: Diagnosis not present

## 2016-09-20 DIAGNOSIS — D509 Iron deficiency anemia, unspecified: Secondary | ICD-10-CM | POA: Diagnosis not present

## 2016-09-20 DIAGNOSIS — N186 End stage renal disease: Secondary | ICD-10-CM | POA: Diagnosis not present

## 2016-09-20 DIAGNOSIS — D631 Anemia in chronic kidney disease: Secondary | ICD-10-CM | POA: Diagnosis not present

## 2016-09-22 DIAGNOSIS — N186 End stage renal disease: Secondary | ICD-10-CM | POA: Diagnosis not present

## 2016-09-22 DIAGNOSIS — N2581 Secondary hyperparathyroidism of renal origin: Secondary | ICD-10-CM | POA: Diagnosis not present

## 2016-09-22 DIAGNOSIS — D631 Anemia in chronic kidney disease: Secondary | ICD-10-CM | POA: Diagnosis not present

## 2016-09-22 DIAGNOSIS — D509 Iron deficiency anemia, unspecified: Secondary | ICD-10-CM | POA: Diagnosis not present

## 2016-09-25 DIAGNOSIS — N2581 Secondary hyperparathyroidism of renal origin: Secondary | ICD-10-CM | POA: Diagnosis not present

## 2016-09-25 DIAGNOSIS — D631 Anemia in chronic kidney disease: Secondary | ICD-10-CM | POA: Diagnosis not present

## 2016-09-25 DIAGNOSIS — D509 Iron deficiency anemia, unspecified: Secondary | ICD-10-CM | POA: Diagnosis not present

## 2016-09-25 DIAGNOSIS — N186 End stage renal disease: Secondary | ICD-10-CM | POA: Diagnosis not present

## 2016-09-27 DIAGNOSIS — D509 Iron deficiency anemia, unspecified: Secondary | ICD-10-CM | POA: Diagnosis not present

## 2016-09-27 DIAGNOSIS — I120 Hypertensive chronic kidney disease with stage 5 chronic kidney disease or end stage renal disease: Secondary | ICD-10-CM | POA: Diagnosis not present

## 2016-09-27 DIAGNOSIS — D631 Anemia in chronic kidney disease: Secondary | ICD-10-CM | POA: Diagnosis not present

## 2016-09-27 DIAGNOSIS — Z992 Dependence on renal dialysis: Secondary | ICD-10-CM | POA: Diagnosis not present

## 2016-09-27 DIAGNOSIS — N2581 Secondary hyperparathyroidism of renal origin: Secondary | ICD-10-CM | POA: Diagnosis not present

## 2016-09-27 DIAGNOSIS — N186 End stage renal disease: Secondary | ICD-10-CM | POA: Diagnosis not present

## 2016-09-29 DIAGNOSIS — N2581 Secondary hyperparathyroidism of renal origin: Secondary | ICD-10-CM | POA: Diagnosis not present

## 2016-09-29 DIAGNOSIS — N186 End stage renal disease: Secondary | ICD-10-CM | POA: Diagnosis not present

## 2016-09-29 DIAGNOSIS — D509 Iron deficiency anemia, unspecified: Secondary | ICD-10-CM | POA: Diagnosis not present

## 2016-09-29 DIAGNOSIS — D631 Anemia in chronic kidney disease: Secondary | ICD-10-CM | POA: Diagnosis not present

## 2016-10-02 DIAGNOSIS — D509 Iron deficiency anemia, unspecified: Secondary | ICD-10-CM | POA: Diagnosis not present

## 2016-10-02 DIAGNOSIS — N186 End stage renal disease: Secondary | ICD-10-CM | POA: Diagnosis not present

## 2016-10-02 DIAGNOSIS — D631 Anemia in chronic kidney disease: Secondary | ICD-10-CM | POA: Diagnosis not present

## 2016-10-02 DIAGNOSIS — N2581 Secondary hyperparathyroidism of renal origin: Secondary | ICD-10-CM | POA: Diagnosis not present

## 2016-10-04 DIAGNOSIS — D509 Iron deficiency anemia, unspecified: Secondary | ICD-10-CM | POA: Diagnosis not present

## 2016-10-04 DIAGNOSIS — N2581 Secondary hyperparathyroidism of renal origin: Secondary | ICD-10-CM | POA: Diagnosis not present

## 2016-10-04 DIAGNOSIS — D631 Anemia in chronic kidney disease: Secondary | ICD-10-CM | POA: Diagnosis not present

## 2016-10-04 DIAGNOSIS — N186 End stage renal disease: Secondary | ICD-10-CM | POA: Diagnosis not present

## 2016-10-06 DIAGNOSIS — N2581 Secondary hyperparathyroidism of renal origin: Secondary | ICD-10-CM | POA: Diagnosis not present

## 2016-10-06 DIAGNOSIS — N186 End stage renal disease: Secondary | ICD-10-CM | POA: Diagnosis not present

## 2016-10-06 DIAGNOSIS — D631 Anemia in chronic kidney disease: Secondary | ICD-10-CM | POA: Diagnosis not present

## 2016-10-06 DIAGNOSIS — D509 Iron deficiency anemia, unspecified: Secondary | ICD-10-CM | POA: Diagnosis not present

## 2016-10-09 DIAGNOSIS — M9903 Segmental and somatic dysfunction of lumbar region: Secondary | ICD-10-CM | POA: Diagnosis not present

## 2016-10-09 DIAGNOSIS — M9905 Segmental and somatic dysfunction of pelvic region: Secondary | ICD-10-CM | POA: Diagnosis not present

## 2016-10-09 DIAGNOSIS — M9902 Segmental and somatic dysfunction of thoracic region: Secondary | ICD-10-CM | POA: Diagnosis not present

## 2016-10-09 DIAGNOSIS — M50322 Other cervical disc degeneration at C5-C6 level: Secondary | ICD-10-CM | POA: Diagnosis not present

## 2016-10-09 DIAGNOSIS — M461 Sacroiliitis, not elsewhere classified: Secondary | ICD-10-CM | POA: Diagnosis not present

## 2016-10-09 DIAGNOSIS — M9901 Segmental and somatic dysfunction of cervical region: Secondary | ICD-10-CM | POA: Diagnosis not present

## 2016-10-09 DIAGNOSIS — M9904 Segmental and somatic dysfunction of sacral region: Secondary | ICD-10-CM | POA: Diagnosis not present

## 2016-10-09 DIAGNOSIS — N186 End stage renal disease: Secondary | ICD-10-CM | POA: Diagnosis not present

## 2016-10-09 DIAGNOSIS — D631 Anemia in chronic kidney disease: Secondary | ICD-10-CM | POA: Diagnosis not present

## 2016-10-09 DIAGNOSIS — M5125 Other intervertebral disc displacement, thoracolumbar region: Secondary | ICD-10-CM | POA: Diagnosis not present

## 2016-10-09 DIAGNOSIS — D509 Iron deficiency anemia, unspecified: Secondary | ICD-10-CM | POA: Diagnosis not present

## 2016-10-09 DIAGNOSIS — M5126 Other intervertebral disc displacement, lumbar region: Secondary | ICD-10-CM | POA: Diagnosis not present

## 2016-10-09 DIAGNOSIS — M5387 Other specified dorsopathies, lumbosacral region: Secondary | ICD-10-CM | POA: Diagnosis not present

## 2016-10-09 DIAGNOSIS — N2581 Secondary hyperparathyroidism of renal origin: Secondary | ICD-10-CM | POA: Diagnosis not present

## 2016-10-11 DIAGNOSIS — D509 Iron deficiency anemia, unspecified: Secondary | ICD-10-CM | POA: Diagnosis not present

## 2016-10-11 DIAGNOSIS — D631 Anemia in chronic kidney disease: Secondary | ICD-10-CM | POA: Diagnosis not present

## 2016-10-11 DIAGNOSIS — N2581 Secondary hyperparathyroidism of renal origin: Secondary | ICD-10-CM | POA: Diagnosis not present

## 2016-10-11 DIAGNOSIS — N186 End stage renal disease: Secondary | ICD-10-CM | POA: Diagnosis not present

## 2016-10-12 DIAGNOSIS — M9904 Segmental and somatic dysfunction of sacral region: Secondary | ICD-10-CM | POA: Diagnosis not present

## 2016-10-12 DIAGNOSIS — M50322 Other cervical disc degeneration at C5-C6 level: Secondary | ICD-10-CM | POA: Diagnosis not present

## 2016-10-12 DIAGNOSIS — M5125 Other intervertebral disc displacement, thoracolumbar region: Secondary | ICD-10-CM | POA: Diagnosis not present

## 2016-10-12 DIAGNOSIS — M461 Sacroiliitis, not elsewhere classified: Secondary | ICD-10-CM | POA: Diagnosis not present

## 2016-10-12 DIAGNOSIS — M9901 Segmental and somatic dysfunction of cervical region: Secondary | ICD-10-CM | POA: Diagnosis not present

## 2016-10-12 DIAGNOSIS — M9902 Segmental and somatic dysfunction of thoracic region: Secondary | ICD-10-CM | POA: Diagnosis not present

## 2016-10-12 DIAGNOSIS — M5126 Other intervertebral disc displacement, lumbar region: Secondary | ICD-10-CM | POA: Diagnosis not present

## 2016-10-12 DIAGNOSIS — M5387 Other specified dorsopathies, lumbosacral region: Secondary | ICD-10-CM | POA: Diagnosis not present

## 2016-10-12 DIAGNOSIS — M9905 Segmental and somatic dysfunction of pelvic region: Secondary | ICD-10-CM | POA: Diagnosis not present

## 2016-10-12 DIAGNOSIS — M9903 Segmental and somatic dysfunction of lumbar region: Secondary | ICD-10-CM | POA: Diagnosis not present

## 2016-10-13 DIAGNOSIS — D631 Anemia in chronic kidney disease: Secondary | ICD-10-CM | POA: Diagnosis not present

## 2016-10-13 DIAGNOSIS — D509 Iron deficiency anemia, unspecified: Secondary | ICD-10-CM | POA: Diagnosis not present

## 2016-10-13 DIAGNOSIS — N186 End stage renal disease: Secondary | ICD-10-CM | POA: Diagnosis not present

## 2016-10-13 DIAGNOSIS — N2581 Secondary hyperparathyroidism of renal origin: Secondary | ICD-10-CM | POA: Diagnosis not present

## 2016-10-16 DIAGNOSIS — D631 Anemia in chronic kidney disease: Secondary | ICD-10-CM | POA: Diagnosis not present

## 2016-10-16 DIAGNOSIS — N186 End stage renal disease: Secondary | ICD-10-CM | POA: Diagnosis not present

## 2016-10-16 DIAGNOSIS — N2581 Secondary hyperparathyroidism of renal origin: Secondary | ICD-10-CM | POA: Diagnosis not present

## 2016-10-16 DIAGNOSIS — D509 Iron deficiency anemia, unspecified: Secondary | ICD-10-CM | POA: Diagnosis not present

## 2016-10-17 DIAGNOSIS — M9901 Segmental and somatic dysfunction of cervical region: Secondary | ICD-10-CM | POA: Diagnosis not present

## 2016-10-17 DIAGNOSIS — M9903 Segmental and somatic dysfunction of lumbar region: Secondary | ICD-10-CM | POA: Diagnosis not present

## 2016-10-17 DIAGNOSIS — M5125 Other intervertebral disc displacement, thoracolumbar region: Secondary | ICD-10-CM | POA: Diagnosis not present

## 2016-10-17 DIAGNOSIS — M5126 Other intervertebral disc displacement, lumbar region: Secondary | ICD-10-CM | POA: Diagnosis not present

## 2016-10-17 DIAGNOSIS — M5387 Other specified dorsopathies, lumbosacral region: Secondary | ICD-10-CM | POA: Diagnosis not present

## 2016-10-17 DIAGNOSIS — M9902 Segmental and somatic dysfunction of thoracic region: Secondary | ICD-10-CM | POA: Diagnosis not present

## 2016-10-17 DIAGNOSIS — M461 Sacroiliitis, not elsewhere classified: Secondary | ICD-10-CM | POA: Diagnosis not present

## 2016-10-17 DIAGNOSIS — M9904 Segmental and somatic dysfunction of sacral region: Secondary | ICD-10-CM | POA: Diagnosis not present

## 2016-10-17 DIAGNOSIS — M9905 Segmental and somatic dysfunction of pelvic region: Secondary | ICD-10-CM | POA: Diagnosis not present

## 2016-10-17 DIAGNOSIS — M50322 Other cervical disc degeneration at C5-C6 level: Secondary | ICD-10-CM | POA: Diagnosis not present

## 2016-10-18 DIAGNOSIS — D631 Anemia in chronic kidney disease: Secondary | ICD-10-CM | POA: Diagnosis not present

## 2016-10-18 DIAGNOSIS — D509 Iron deficiency anemia, unspecified: Secondary | ICD-10-CM | POA: Diagnosis not present

## 2016-10-18 DIAGNOSIS — N186 End stage renal disease: Secondary | ICD-10-CM | POA: Diagnosis not present

## 2016-10-18 DIAGNOSIS — N2581 Secondary hyperparathyroidism of renal origin: Secondary | ICD-10-CM | POA: Diagnosis not present

## 2016-10-19 DIAGNOSIS — M461 Sacroiliitis, not elsewhere classified: Secondary | ICD-10-CM | POA: Diagnosis not present

## 2016-10-19 DIAGNOSIS — M9902 Segmental and somatic dysfunction of thoracic region: Secondary | ICD-10-CM | POA: Diagnosis not present

## 2016-10-19 DIAGNOSIS — M5387 Other specified dorsopathies, lumbosacral region: Secondary | ICD-10-CM | POA: Diagnosis not present

## 2016-10-19 DIAGNOSIS — M5125 Other intervertebral disc displacement, thoracolumbar region: Secondary | ICD-10-CM | POA: Diagnosis not present

## 2016-10-19 DIAGNOSIS — M9904 Segmental and somatic dysfunction of sacral region: Secondary | ICD-10-CM | POA: Diagnosis not present

## 2016-10-19 DIAGNOSIS — M9901 Segmental and somatic dysfunction of cervical region: Secondary | ICD-10-CM | POA: Diagnosis not present

## 2016-10-19 DIAGNOSIS — M9903 Segmental and somatic dysfunction of lumbar region: Secondary | ICD-10-CM | POA: Diagnosis not present

## 2016-10-19 DIAGNOSIS — M9905 Segmental and somatic dysfunction of pelvic region: Secondary | ICD-10-CM | POA: Diagnosis not present

## 2016-10-19 DIAGNOSIS — M50322 Other cervical disc degeneration at C5-C6 level: Secondary | ICD-10-CM | POA: Diagnosis not present

## 2016-10-19 DIAGNOSIS — M5126 Other intervertebral disc displacement, lumbar region: Secondary | ICD-10-CM | POA: Diagnosis not present

## 2016-10-20 DIAGNOSIS — D509 Iron deficiency anemia, unspecified: Secondary | ICD-10-CM | POA: Diagnosis not present

## 2016-10-20 DIAGNOSIS — N2581 Secondary hyperparathyroidism of renal origin: Secondary | ICD-10-CM | POA: Diagnosis not present

## 2016-10-20 DIAGNOSIS — N186 End stage renal disease: Secondary | ICD-10-CM | POA: Diagnosis not present

## 2016-10-20 DIAGNOSIS — D631 Anemia in chronic kidney disease: Secondary | ICD-10-CM | POA: Diagnosis not present

## 2016-10-23 DIAGNOSIS — N186 End stage renal disease: Secondary | ICD-10-CM | POA: Diagnosis not present

## 2016-10-23 DIAGNOSIS — D631 Anemia in chronic kidney disease: Secondary | ICD-10-CM | POA: Diagnosis not present

## 2016-10-23 DIAGNOSIS — D509 Iron deficiency anemia, unspecified: Secondary | ICD-10-CM | POA: Diagnosis not present

## 2016-10-23 DIAGNOSIS — N2581 Secondary hyperparathyroidism of renal origin: Secondary | ICD-10-CM | POA: Diagnosis not present

## 2016-10-25 DIAGNOSIS — N2581 Secondary hyperparathyroidism of renal origin: Secondary | ICD-10-CM | POA: Diagnosis not present

## 2016-10-25 DIAGNOSIS — D631 Anemia in chronic kidney disease: Secondary | ICD-10-CM | POA: Diagnosis not present

## 2016-10-25 DIAGNOSIS — D509 Iron deficiency anemia, unspecified: Secondary | ICD-10-CM | POA: Diagnosis not present

## 2016-10-25 DIAGNOSIS — I120 Hypertensive chronic kidney disease with stage 5 chronic kidney disease or end stage renal disease: Secondary | ICD-10-CM | POA: Diagnosis not present

## 2016-10-25 DIAGNOSIS — Z992 Dependence on renal dialysis: Secondary | ICD-10-CM | POA: Diagnosis not present

## 2016-10-25 DIAGNOSIS — N186 End stage renal disease: Secondary | ICD-10-CM | POA: Diagnosis not present

## 2016-10-26 DIAGNOSIS — M50322 Other cervical disc degeneration at C5-C6 level: Secondary | ICD-10-CM | POA: Diagnosis not present

## 2016-10-26 DIAGNOSIS — M9901 Segmental and somatic dysfunction of cervical region: Secondary | ICD-10-CM | POA: Diagnosis not present

## 2016-10-26 DIAGNOSIS — M5125 Other intervertebral disc displacement, thoracolumbar region: Secondary | ICD-10-CM | POA: Diagnosis not present

## 2016-10-26 DIAGNOSIS — M9904 Segmental and somatic dysfunction of sacral region: Secondary | ICD-10-CM | POA: Diagnosis not present

## 2016-10-26 DIAGNOSIS — M5126 Other intervertebral disc displacement, lumbar region: Secondary | ICD-10-CM | POA: Diagnosis not present

## 2016-10-26 DIAGNOSIS — M461 Sacroiliitis, not elsewhere classified: Secondary | ICD-10-CM | POA: Diagnosis not present

## 2016-10-26 DIAGNOSIS — M5387 Other specified dorsopathies, lumbosacral region: Secondary | ICD-10-CM | POA: Diagnosis not present

## 2016-10-26 DIAGNOSIS — M9905 Segmental and somatic dysfunction of pelvic region: Secondary | ICD-10-CM | POA: Diagnosis not present

## 2016-10-26 DIAGNOSIS — M9902 Segmental and somatic dysfunction of thoracic region: Secondary | ICD-10-CM | POA: Diagnosis not present

## 2016-10-26 DIAGNOSIS — M9903 Segmental and somatic dysfunction of lumbar region: Secondary | ICD-10-CM | POA: Diagnosis not present

## 2016-10-27 DIAGNOSIS — D509 Iron deficiency anemia, unspecified: Secondary | ICD-10-CM | POA: Diagnosis not present

## 2016-10-27 DIAGNOSIS — N186 End stage renal disease: Secondary | ICD-10-CM | POA: Diagnosis not present

## 2016-10-27 DIAGNOSIS — D631 Anemia in chronic kidney disease: Secondary | ICD-10-CM | POA: Diagnosis not present

## 2016-10-27 DIAGNOSIS — N2581 Secondary hyperparathyroidism of renal origin: Secondary | ICD-10-CM | POA: Diagnosis not present

## 2016-10-30 DIAGNOSIS — N186 End stage renal disease: Secondary | ICD-10-CM | POA: Diagnosis not present

## 2016-10-30 DIAGNOSIS — D631 Anemia in chronic kidney disease: Secondary | ICD-10-CM | POA: Diagnosis not present

## 2016-10-30 DIAGNOSIS — N2581 Secondary hyperparathyroidism of renal origin: Secondary | ICD-10-CM | POA: Diagnosis not present

## 2016-10-30 DIAGNOSIS — D509 Iron deficiency anemia, unspecified: Secondary | ICD-10-CM | POA: Diagnosis not present

## 2016-11-01 DIAGNOSIS — N2581 Secondary hyperparathyroidism of renal origin: Secondary | ICD-10-CM | POA: Diagnosis not present

## 2016-11-01 DIAGNOSIS — D509 Iron deficiency anemia, unspecified: Secondary | ICD-10-CM | POA: Diagnosis not present

## 2016-11-01 DIAGNOSIS — D631 Anemia in chronic kidney disease: Secondary | ICD-10-CM | POA: Diagnosis not present

## 2016-11-01 DIAGNOSIS — N186 End stage renal disease: Secondary | ICD-10-CM | POA: Diagnosis not present

## 2016-11-02 DIAGNOSIS — M461 Sacroiliitis, not elsewhere classified: Secondary | ICD-10-CM | POA: Diagnosis not present

## 2016-11-02 DIAGNOSIS — M9904 Segmental and somatic dysfunction of sacral region: Secondary | ICD-10-CM | POA: Diagnosis not present

## 2016-11-02 DIAGNOSIS — M9905 Segmental and somatic dysfunction of pelvic region: Secondary | ICD-10-CM | POA: Diagnosis not present

## 2016-11-02 DIAGNOSIS — M5125 Other intervertebral disc displacement, thoracolumbar region: Secondary | ICD-10-CM | POA: Diagnosis not present

## 2016-11-02 DIAGNOSIS — M5387 Other specified dorsopathies, lumbosacral region: Secondary | ICD-10-CM | POA: Diagnosis not present

## 2016-11-02 DIAGNOSIS — M50322 Other cervical disc degeneration at C5-C6 level: Secondary | ICD-10-CM | POA: Diagnosis not present

## 2016-11-02 DIAGNOSIS — M9903 Segmental and somatic dysfunction of lumbar region: Secondary | ICD-10-CM | POA: Diagnosis not present

## 2016-11-02 DIAGNOSIS — M9902 Segmental and somatic dysfunction of thoracic region: Secondary | ICD-10-CM | POA: Diagnosis not present

## 2016-11-02 DIAGNOSIS — M9901 Segmental and somatic dysfunction of cervical region: Secondary | ICD-10-CM | POA: Diagnosis not present

## 2016-11-02 DIAGNOSIS — M5126 Other intervertebral disc displacement, lumbar region: Secondary | ICD-10-CM | POA: Diagnosis not present

## 2016-11-03 DIAGNOSIS — D631 Anemia in chronic kidney disease: Secondary | ICD-10-CM | POA: Diagnosis not present

## 2016-11-03 DIAGNOSIS — N186 End stage renal disease: Secondary | ICD-10-CM | POA: Diagnosis not present

## 2016-11-03 DIAGNOSIS — N2581 Secondary hyperparathyroidism of renal origin: Secondary | ICD-10-CM | POA: Diagnosis not present

## 2016-11-03 DIAGNOSIS — D509 Iron deficiency anemia, unspecified: Secondary | ICD-10-CM | POA: Diagnosis not present

## 2016-11-06 DIAGNOSIS — D509 Iron deficiency anemia, unspecified: Secondary | ICD-10-CM | POA: Diagnosis not present

## 2016-11-06 DIAGNOSIS — N2581 Secondary hyperparathyroidism of renal origin: Secondary | ICD-10-CM | POA: Diagnosis not present

## 2016-11-06 DIAGNOSIS — D631 Anemia in chronic kidney disease: Secondary | ICD-10-CM | POA: Diagnosis not present

## 2016-11-06 DIAGNOSIS — N186 End stage renal disease: Secondary | ICD-10-CM | POA: Diagnosis not present

## 2016-11-08 DIAGNOSIS — N186 End stage renal disease: Secondary | ICD-10-CM | POA: Diagnosis not present

## 2016-11-08 DIAGNOSIS — D631 Anemia in chronic kidney disease: Secondary | ICD-10-CM | POA: Diagnosis not present

## 2016-11-08 DIAGNOSIS — N2581 Secondary hyperparathyroidism of renal origin: Secondary | ICD-10-CM | POA: Diagnosis not present

## 2016-11-08 DIAGNOSIS — D509 Iron deficiency anemia, unspecified: Secondary | ICD-10-CM | POA: Diagnosis not present

## 2016-11-09 DIAGNOSIS — M9905 Segmental and somatic dysfunction of pelvic region: Secondary | ICD-10-CM | POA: Diagnosis not present

## 2016-11-09 DIAGNOSIS — M5126 Other intervertebral disc displacement, lumbar region: Secondary | ICD-10-CM | POA: Diagnosis not present

## 2016-11-09 DIAGNOSIS — M9901 Segmental and somatic dysfunction of cervical region: Secondary | ICD-10-CM | POA: Diagnosis not present

## 2016-11-09 DIAGNOSIS — M5387 Other specified dorsopathies, lumbosacral region: Secondary | ICD-10-CM | POA: Diagnosis not present

## 2016-11-09 DIAGNOSIS — M9904 Segmental and somatic dysfunction of sacral region: Secondary | ICD-10-CM | POA: Diagnosis not present

## 2016-11-09 DIAGNOSIS — M5125 Other intervertebral disc displacement, thoracolumbar region: Secondary | ICD-10-CM | POA: Diagnosis not present

## 2016-11-09 DIAGNOSIS — M461 Sacroiliitis, not elsewhere classified: Secondary | ICD-10-CM | POA: Diagnosis not present

## 2016-11-09 DIAGNOSIS — M9903 Segmental and somatic dysfunction of lumbar region: Secondary | ICD-10-CM | POA: Diagnosis not present

## 2016-11-09 DIAGNOSIS — M9902 Segmental and somatic dysfunction of thoracic region: Secondary | ICD-10-CM | POA: Diagnosis not present

## 2016-11-09 DIAGNOSIS — M50322 Other cervical disc degeneration at C5-C6 level: Secondary | ICD-10-CM | POA: Diagnosis not present

## 2016-11-10 DIAGNOSIS — D509 Iron deficiency anemia, unspecified: Secondary | ICD-10-CM | POA: Diagnosis not present

## 2016-11-10 DIAGNOSIS — D631 Anemia in chronic kidney disease: Secondary | ICD-10-CM | POA: Diagnosis not present

## 2016-11-10 DIAGNOSIS — N2581 Secondary hyperparathyroidism of renal origin: Secondary | ICD-10-CM | POA: Diagnosis not present

## 2016-11-10 DIAGNOSIS — N186 End stage renal disease: Secondary | ICD-10-CM | POA: Diagnosis not present

## 2016-11-13 DIAGNOSIS — N2581 Secondary hyperparathyroidism of renal origin: Secondary | ICD-10-CM | POA: Diagnosis not present

## 2016-11-13 DIAGNOSIS — N186 End stage renal disease: Secondary | ICD-10-CM | POA: Diagnosis not present

## 2016-11-13 DIAGNOSIS — D631 Anemia in chronic kidney disease: Secondary | ICD-10-CM | POA: Diagnosis not present

## 2016-11-13 DIAGNOSIS — D509 Iron deficiency anemia, unspecified: Secondary | ICD-10-CM | POA: Diagnosis not present

## 2016-11-15 DIAGNOSIS — N186 End stage renal disease: Secondary | ICD-10-CM | POA: Diagnosis not present

## 2016-11-15 DIAGNOSIS — D631 Anemia in chronic kidney disease: Secondary | ICD-10-CM | POA: Diagnosis not present

## 2016-11-15 DIAGNOSIS — D509 Iron deficiency anemia, unspecified: Secondary | ICD-10-CM | POA: Diagnosis not present

## 2016-11-15 DIAGNOSIS — N2581 Secondary hyperparathyroidism of renal origin: Secondary | ICD-10-CM | POA: Diagnosis not present

## 2016-11-16 DIAGNOSIS — M5125 Other intervertebral disc displacement, thoracolumbar region: Secondary | ICD-10-CM | POA: Diagnosis not present

## 2016-11-16 DIAGNOSIS — M5126 Other intervertebral disc displacement, lumbar region: Secondary | ICD-10-CM | POA: Diagnosis not present

## 2016-11-16 DIAGNOSIS — M9904 Segmental and somatic dysfunction of sacral region: Secondary | ICD-10-CM | POA: Diagnosis not present

## 2016-11-16 DIAGNOSIS — M461 Sacroiliitis, not elsewhere classified: Secondary | ICD-10-CM | POA: Diagnosis not present

## 2016-11-16 DIAGNOSIS — M9905 Segmental and somatic dysfunction of pelvic region: Secondary | ICD-10-CM | POA: Diagnosis not present

## 2016-11-16 DIAGNOSIS — M50322 Other cervical disc degeneration at C5-C6 level: Secondary | ICD-10-CM | POA: Diagnosis not present

## 2016-11-16 DIAGNOSIS — M9902 Segmental and somatic dysfunction of thoracic region: Secondary | ICD-10-CM | POA: Diagnosis not present

## 2016-11-16 DIAGNOSIS — M9903 Segmental and somatic dysfunction of lumbar region: Secondary | ICD-10-CM | POA: Diagnosis not present

## 2016-11-16 DIAGNOSIS — M9901 Segmental and somatic dysfunction of cervical region: Secondary | ICD-10-CM | POA: Diagnosis not present

## 2016-11-16 DIAGNOSIS — M5387 Other specified dorsopathies, lumbosacral region: Secondary | ICD-10-CM | POA: Diagnosis not present

## 2016-11-17 DIAGNOSIS — D631 Anemia in chronic kidney disease: Secondary | ICD-10-CM | POA: Diagnosis not present

## 2016-11-17 DIAGNOSIS — D509 Iron deficiency anemia, unspecified: Secondary | ICD-10-CM | POA: Diagnosis not present

## 2016-11-17 DIAGNOSIS — N186 End stage renal disease: Secondary | ICD-10-CM | POA: Diagnosis not present

## 2016-11-17 DIAGNOSIS — N2581 Secondary hyperparathyroidism of renal origin: Secondary | ICD-10-CM | POA: Diagnosis not present

## 2016-11-20 DIAGNOSIS — N186 End stage renal disease: Secondary | ICD-10-CM | POA: Diagnosis not present

## 2016-11-20 DIAGNOSIS — D509 Iron deficiency anemia, unspecified: Secondary | ICD-10-CM | POA: Diagnosis not present

## 2016-11-20 DIAGNOSIS — N2581 Secondary hyperparathyroidism of renal origin: Secondary | ICD-10-CM | POA: Diagnosis not present

## 2016-11-20 DIAGNOSIS — D631 Anemia in chronic kidney disease: Secondary | ICD-10-CM | POA: Diagnosis not present

## 2016-11-22 DIAGNOSIS — N2581 Secondary hyperparathyroidism of renal origin: Secondary | ICD-10-CM | POA: Diagnosis not present

## 2016-11-22 DIAGNOSIS — D631 Anemia in chronic kidney disease: Secondary | ICD-10-CM | POA: Diagnosis not present

## 2016-11-22 DIAGNOSIS — N186 End stage renal disease: Secondary | ICD-10-CM | POA: Diagnosis not present

## 2016-11-22 DIAGNOSIS — D509 Iron deficiency anemia, unspecified: Secondary | ICD-10-CM | POA: Diagnosis not present

## 2016-11-23 DIAGNOSIS — M9902 Segmental and somatic dysfunction of thoracic region: Secondary | ICD-10-CM | POA: Diagnosis not present

## 2016-11-23 DIAGNOSIS — M50322 Other cervical disc degeneration at C5-C6 level: Secondary | ICD-10-CM | POA: Diagnosis not present

## 2016-11-23 DIAGNOSIS — M5125 Other intervertebral disc displacement, thoracolumbar region: Secondary | ICD-10-CM | POA: Diagnosis not present

## 2016-11-23 DIAGNOSIS — M5387 Other specified dorsopathies, lumbosacral region: Secondary | ICD-10-CM | POA: Diagnosis not present

## 2016-11-23 DIAGNOSIS — M9905 Segmental and somatic dysfunction of pelvic region: Secondary | ICD-10-CM | POA: Diagnosis not present

## 2016-11-23 DIAGNOSIS — M9901 Segmental and somatic dysfunction of cervical region: Secondary | ICD-10-CM | POA: Diagnosis not present

## 2016-11-23 DIAGNOSIS — M9904 Segmental and somatic dysfunction of sacral region: Secondary | ICD-10-CM | POA: Diagnosis not present

## 2016-11-23 DIAGNOSIS — M5126 Other intervertebral disc displacement, lumbar region: Secondary | ICD-10-CM | POA: Diagnosis not present

## 2016-11-23 DIAGNOSIS — M461 Sacroiliitis, not elsewhere classified: Secondary | ICD-10-CM | POA: Diagnosis not present

## 2016-11-23 DIAGNOSIS — M9903 Segmental and somatic dysfunction of lumbar region: Secondary | ICD-10-CM | POA: Diagnosis not present

## 2016-11-24 DIAGNOSIS — N186 End stage renal disease: Secondary | ICD-10-CM | POA: Diagnosis not present

## 2016-11-24 DIAGNOSIS — D631 Anemia in chronic kidney disease: Secondary | ICD-10-CM | POA: Diagnosis not present

## 2016-11-24 DIAGNOSIS — D509 Iron deficiency anemia, unspecified: Secondary | ICD-10-CM | POA: Diagnosis not present

## 2016-11-24 DIAGNOSIS — N2581 Secondary hyperparathyroidism of renal origin: Secondary | ICD-10-CM | POA: Diagnosis not present

## 2016-11-25 DIAGNOSIS — N186 End stage renal disease: Secondary | ICD-10-CM | POA: Diagnosis not present

## 2016-11-25 DIAGNOSIS — Z992 Dependence on renal dialysis: Secondary | ICD-10-CM | POA: Diagnosis not present

## 2016-11-25 DIAGNOSIS — I120 Hypertensive chronic kidney disease with stage 5 chronic kidney disease or end stage renal disease: Secondary | ICD-10-CM | POA: Diagnosis not present

## 2016-11-27 DIAGNOSIS — D509 Iron deficiency anemia, unspecified: Secondary | ICD-10-CM | POA: Diagnosis not present

## 2016-11-27 DIAGNOSIS — N186 End stage renal disease: Secondary | ICD-10-CM | POA: Diagnosis not present

## 2016-11-27 DIAGNOSIS — D631 Anemia in chronic kidney disease: Secondary | ICD-10-CM | POA: Diagnosis not present

## 2016-11-27 DIAGNOSIS — N2581 Secondary hyperparathyroidism of renal origin: Secondary | ICD-10-CM | POA: Diagnosis not present

## 2016-11-29 DIAGNOSIS — D631 Anemia in chronic kidney disease: Secondary | ICD-10-CM | POA: Diagnosis not present

## 2016-11-29 DIAGNOSIS — N2581 Secondary hyperparathyroidism of renal origin: Secondary | ICD-10-CM | POA: Diagnosis not present

## 2016-11-29 DIAGNOSIS — N186 End stage renal disease: Secondary | ICD-10-CM | POA: Diagnosis not present

## 2016-11-29 DIAGNOSIS — D509 Iron deficiency anemia, unspecified: Secondary | ICD-10-CM | POA: Diagnosis not present

## 2016-12-01 DIAGNOSIS — N186 End stage renal disease: Secondary | ICD-10-CM | POA: Diagnosis not present

## 2016-12-01 DIAGNOSIS — N2581 Secondary hyperparathyroidism of renal origin: Secondary | ICD-10-CM | POA: Diagnosis not present

## 2016-12-01 DIAGNOSIS — D631 Anemia in chronic kidney disease: Secondary | ICD-10-CM | POA: Diagnosis not present

## 2016-12-01 DIAGNOSIS — D509 Iron deficiency anemia, unspecified: Secondary | ICD-10-CM | POA: Diagnosis not present

## 2016-12-04 DIAGNOSIS — N186 End stage renal disease: Secondary | ICD-10-CM | POA: Diagnosis not present

## 2016-12-04 DIAGNOSIS — D631 Anemia in chronic kidney disease: Secondary | ICD-10-CM | POA: Diagnosis not present

## 2016-12-04 DIAGNOSIS — N2581 Secondary hyperparathyroidism of renal origin: Secondary | ICD-10-CM | POA: Diagnosis not present

## 2016-12-04 DIAGNOSIS — D509 Iron deficiency anemia, unspecified: Secondary | ICD-10-CM | POA: Diagnosis not present

## 2016-12-06 DIAGNOSIS — D631 Anemia in chronic kidney disease: Secondary | ICD-10-CM | POA: Diagnosis not present

## 2016-12-06 DIAGNOSIS — N2581 Secondary hyperparathyroidism of renal origin: Secondary | ICD-10-CM | POA: Diagnosis not present

## 2016-12-06 DIAGNOSIS — D509 Iron deficiency anemia, unspecified: Secondary | ICD-10-CM | POA: Diagnosis not present

## 2016-12-06 DIAGNOSIS — N186 End stage renal disease: Secondary | ICD-10-CM | POA: Diagnosis not present

## 2016-12-07 DIAGNOSIS — M461 Sacroiliitis, not elsewhere classified: Secondary | ICD-10-CM | POA: Diagnosis not present

## 2016-12-07 DIAGNOSIS — M9904 Segmental and somatic dysfunction of sacral region: Secondary | ICD-10-CM | POA: Diagnosis not present

## 2016-12-07 DIAGNOSIS — M5387 Other specified dorsopathies, lumbosacral region: Secondary | ICD-10-CM | POA: Diagnosis not present

## 2016-12-07 DIAGNOSIS — M9901 Segmental and somatic dysfunction of cervical region: Secondary | ICD-10-CM | POA: Diagnosis not present

## 2016-12-07 DIAGNOSIS — M5126 Other intervertebral disc displacement, lumbar region: Secondary | ICD-10-CM | POA: Diagnosis not present

## 2016-12-07 DIAGNOSIS — M50322 Other cervical disc degeneration at C5-C6 level: Secondary | ICD-10-CM | POA: Diagnosis not present

## 2016-12-07 DIAGNOSIS — M5125 Other intervertebral disc displacement, thoracolumbar region: Secondary | ICD-10-CM | POA: Diagnosis not present

## 2016-12-07 DIAGNOSIS — M9905 Segmental and somatic dysfunction of pelvic region: Secondary | ICD-10-CM | POA: Diagnosis not present

## 2016-12-07 DIAGNOSIS — M9903 Segmental and somatic dysfunction of lumbar region: Secondary | ICD-10-CM | POA: Diagnosis not present

## 2016-12-07 DIAGNOSIS — M9902 Segmental and somatic dysfunction of thoracic region: Secondary | ICD-10-CM | POA: Diagnosis not present

## 2016-12-08 DIAGNOSIS — D631 Anemia in chronic kidney disease: Secondary | ICD-10-CM | POA: Diagnosis not present

## 2016-12-08 DIAGNOSIS — N2581 Secondary hyperparathyroidism of renal origin: Secondary | ICD-10-CM | POA: Diagnosis not present

## 2016-12-08 DIAGNOSIS — D509 Iron deficiency anemia, unspecified: Secondary | ICD-10-CM | POA: Diagnosis not present

## 2016-12-08 DIAGNOSIS — N186 End stage renal disease: Secondary | ICD-10-CM | POA: Diagnosis not present

## 2016-12-11 DIAGNOSIS — N2581 Secondary hyperparathyroidism of renal origin: Secondary | ICD-10-CM | POA: Diagnosis not present

## 2016-12-11 DIAGNOSIS — D509 Iron deficiency anemia, unspecified: Secondary | ICD-10-CM | POA: Diagnosis not present

## 2016-12-11 DIAGNOSIS — D631 Anemia in chronic kidney disease: Secondary | ICD-10-CM | POA: Diagnosis not present

## 2016-12-11 DIAGNOSIS — N186 End stage renal disease: Secondary | ICD-10-CM | POA: Diagnosis not present

## 2016-12-12 DIAGNOSIS — N2581 Secondary hyperparathyroidism of renal origin: Secondary | ICD-10-CM | POA: Diagnosis not present

## 2016-12-12 DIAGNOSIS — D509 Iron deficiency anemia, unspecified: Secondary | ICD-10-CM | POA: Diagnosis not present

## 2016-12-12 DIAGNOSIS — N186 End stage renal disease: Secondary | ICD-10-CM | POA: Diagnosis not present

## 2016-12-12 DIAGNOSIS — D631 Anemia in chronic kidney disease: Secondary | ICD-10-CM | POA: Diagnosis not present

## 2016-12-14 DIAGNOSIS — M9902 Segmental and somatic dysfunction of thoracic region: Secondary | ICD-10-CM | POA: Diagnosis not present

## 2016-12-14 DIAGNOSIS — M5125 Other intervertebral disc displacement, thoracolumbar region: Secondary | ICD-10-CM | POA: Diagnosis not present

## 2016-12-14 DIAGNOSIS — M50322 Other cervical disc degeneration at C5-C6 level: Secondary | ICD-10-CM | POA: Diagnosis not present

## 2016-12-14 DIAGNOSIS — M9903 Segmental and somatic dysfunction of lumbar region: Secondary | ICD-10-CM | POA: Diagnosis not present

## 2016-12-14 DIAGNOSIS — M9905 Segmental and somatic dysfunction of pelvic region: Secondary | ICD-10-CM | POA: Diagnosis not present

## 2016-12-14 DIAGNOSIS — M5126 Other intervertebral disc displacement, lumbar region: Secondary | ICD-10-CM | POA: Diagnosis not present

## 2016-12-14 DIAGNOSIS — M9904 Segmental and somatic dysfunction of sacral region: Secondary | ICD-10-CM | POA: Diagnosis not present

## 2016-12-14 DIAGNOSIS — M9901 Segmental and somatic dysfunction of cervical region: Secondary | ICD-10-CM | POA: Diagnosis not present

## 2016-12-14 DIAGNOSIS — M5387 Other specified dorsopathies, lumbosacral region: Secondary | ICD-10-CM | POA: Diagnosis not present

## 2016-12-14 DIAGNOSIS — M461 Sacroiliitis, not elsewhere classified: Secondary | ICD-10-CM | POA: Diagnosis not present

## 2016-12-15 DIAGNOSIS — N186 End stage renal disease: Secondary | ICD-10-CM | POA: Diagnosis not present

## 2016-12-15 DIAGNOSIS — N2581 Secondary hyperparathyroidism of renal origin: Secondary | ICD-10-CM | POA: Diagnosis not present

## 2016-12-15 DIAGNOSIS — D509 Iron deficiency anemia, unspecified: Secondary | ICD-10-CM | POA: Diagnosis not present

## 2016-12-15 DIAGNOSIS — D631 Anemia in chronic kidney disease: Secondary | ICD-10-CM | POA: Diagnosis not present

## 2016-12-18 DIAGNOSIS — D631 Anemia in chronic kidney disease: Secondary | ICD-10-CM | POA: Diagnosis not present

## 2016-12-18 DIAGNOSIS — N2581 Secondary hyperparathyroidism of renal origin: Secondary | ICD-10-CM | POA: Diagnosis not present

## 2016-12-18 DIAGNOSIS — D509 Iron deficiency anemia, unspecified: Secondary | ICD-10-CM | POA: Diagnosis not present

## 2016-12-18 DIAGNOSIS — N186 End stage renal disease: Secondary | ICD-10-CM | POA: Diagnosis not present

## 2016-12-20 DIAGNOSIS — N2581 Secondary hyperparathyroidism of renal origin: Secondary | ICD-10-CM | POA: Diagnosis not present

## 2016-12-20 DIAGNOSIS — N186 End stage renal disease: Secondary | ICD-10-CM | POA: Diagnosis not present

## 2016-12-20 DIAGNOSIS — D631 Anemia in chronic kidney disease: Secondary | ICD-10-CM | POA: Diagnosis not present

## 2016-12-20 DIAGNOSIS — D509 Iron deficiency anemia, unspecified: Secondary | ICD-10-CM | POA: Diagnosis not present

## 2016-12-21 DIAGNOSIS — M5125 Other intervertebral disc displacement, thoracolumbar region: Secondary | ICD-10-CM | POA: Diagnosis not present

## 2016-12-21 DIAGNOSIS — M5126 Other intervertebral disc displacement, lumbar region: Secondary | ICD-10-CM | POA: Diagnosis not present

## 2016-12-21 DIAGNOSIS — M9902 Segmental and somatic dysfunction of thoracic region: Secondary | ICD-10-CM | POA: Diagnosis not present

## 2016-12-21 DIAGNOSIS — M5387 Other specified dorsopathies, lumbosacral region: Secondary | ICD-10-CM | POA: Diagnosis not present

## 2016-12-21 DIAGNOSIS — M9901 Segmental and somatic dysfunction of cervical region: Secondary | ICD-10-CM | POA: Diagnosis not present

## 2016-12-21 DIAGNOSIS — M9905 Segmental and somatic dysfunction of pelvic region: Secondary | ICD-10-CM | POA: Diagnosis not present

## 2016-12-21 DIAGNOSIS — M9904 Segmental and somatic dysfunction of sacral region: Secondary | ICD-10-CM | POA: Diagnosis not present

## 2016-12-21 DIAGNOSIS — M50322 Other cervical disc degeneration at C5-C6 level: Secondary | ICD-10-CM | POA: Diagnosis not present

## 2016-12-21 DIAGNOSIS — M9903 Segmental and somatic dysfunction of lumbar region: Secondary | ICD-10-CM | POA: Diagnosis not present

## 2016-12-21 DIAGNOSIS — M461 Sacroiliitis, not elsewhere classified: Secondary | ICD-10-CM | POA: Diagnosis not present

## 2016-12-22 DIAGNOSIS — D631 Anemia in chronic kidney disease: Secondary | ICD-10-CM | POA: Diagnosis not present

## 2016-12-22 DIAGNOSIS — D509 Iron deficiency anemia, unspecified: Secondary | ICD-10-CM | POA: Diagnosis not present

## 2016-12-22 DIAGNOSIS — N2581 Secondary hyperparathyroidism of renal origin: Secondary | ICD-10-CM | POA: Diagnosis not present

## 2016-12-22 DIAGNOSIS — N186 End stage renal disease: Secondary | ICD-10-CM | POA: Diagnosis not present

## 2016-12-25 DIAGNOSIS — N186 End stage renal disease: Secondary | ICD-10-CM | POA: Diagnosis not present

## 2016-12-25 DIAGNOSIS — D509 Iron deficiency anemia, unspecified: Secondary | ICD-10-CM | POA: Diagnosis not present

## 2016-12-25 DIAGNOSIS — N2581 Secondary hyperparathyroidism of renal origin: Secondary | ICD-10-CM | POA: Diagnosis not present

## 2016-12-25 DIAGNOSIS — D631 Anemia in chronic kidney disease: Secondary | ICD-10-CM | POA: Diagnosis not present

## 2016-12-25 DIAGNOSIS — I120 Hypertensive chronic kidney disease with stage 5 chronic kidney disease or end stage renal disease: Secondary | ICD-10-CM | POA: Diagnosis not present

## 2016-12-25 DIAGNOSIS — Z992 Dependence on renal dialysis: Secondary | ICD-10-CM | POA: Diagnosis not present

## 2016-12-27 DIAGNOSIS — D631 Anemia in chronic kidney disease: Secondary | ICD-10-CM | POA: Diagnosis not present

## 2016-12-27 DIAGNOSIS — N2581 Secondary hyperparathyroidism of renal origin: Secondary | ICD-10-CM | POA: Diagnosis not present

## 2016-12-27 DIAGNOSIS — N186 End stage renal disease: Secondary | ICD-10-CM | POA: Diagnosis not present

## 2016-12-28 DIAGNOSIS — N186 End stage renal disease: Secondary | ICD-10-CM | POA: Diagnosis not present

## 2016-12-28 DIAGNOSIS — M9902 Segmental and somatic dysfunction of thoracic region: Secondary | ICD-10-CM | POA: Diagnosis not present

## 2016-12-28 DIAGNOSIS — M545 Low back pain: Secondary | ICD-10-CM | POA: Diagnosis not present

## 2016-12-28 DIAGNOSIS — M9901 Segmental and somatic dysfunction of cervical region: Secondary | ICD-10-CM | POA: Diagnosis not present

## 2016-12-28 DIAGNOSIS — L299 Pruritus, unspecified: Secondary | ICD-10-CM | POA: Diagnosis not present

## 2016-12-28 DIAGNOSIS — Z6833 Body mass index (BMI) 33.0-33.9, adult: Secondary | ICD-10-CM | POA: Diagnosis not present

## 2016-12-28 DIAGNOSIS — M5126 Other intervertebral disc displacement, lumbar region: Secondary | ICD-10-CM | POA: Diagnosis not present

## 2016-12-28 DIAGNOSIS — M9905 Segmental and somatic dysfunction of pelvic region: Secondary | ICD-10-CM | POA: Diagnosis not present

## 2016-12-28 DIAGNOSIS — M50322 Other cervical disc degeneration at C5-C6 level: Secondary | ICD-10-CM | POA: Diagnosis not present

## 2016-12-28 DIAGNOSIS — M9903 Segmental and somatic dysfunction of lumbar region: Secondary | ICD-10-CM | POA: Diagnosis not present

## 2016-12-28 DIAGNOSIS — M5387 Other specified dorsopathies, lumbosacral region: Secondary | ICD-10-CM | POA: Diagnosis not present

## 2016-12-28 DIAGNOSIS — M5125 Other intervertebral disc displacement, thoracolumbar region: Secondary | ICD-10-CM | POA: Diagnosis not present

## 2016-12-28 DIAGNOSIS — M9904 Segmental and somatic dysfunction of sacral region: Secondary | ICD-10-CM | POA: Diagnosis not present

## 2016-12-28 DIAGNOSIS — M461 Sacroiliitis, not elsewhere classified: Secondary | ICD-10-CM | POA: Diagnosis not present

## 2016-12-29 DIAGNOSIS — N2581 Secondary hyperparathyroidism of renal origin: Secondary | ICD-10-CM | POA: Diagnosis not present

## 2016-12-29 DIAGNOSIS — D631 Anemia in chronic kidney disease: Secondary | ICD-10-CM | POA: Diagnosis not present

## 2016-12-29 DIAGNOSIS — N186 End stage renal disease: Secondary | ICD-10-CM | POA: Diagnosis not present

## 2017-01-01 DIAGNOSIS — D631 Anemia in chronic kidney disease: Secondary | ICD-10-CM | POA: Diagnosis not present

## 2017-01-01 DIAGNOSIS — N2581 Secondary hyperparathyroidism of renal origin: Secondary | ICD-10-CM | POA: Diagnosis not present

## 2017-01-01 DIAGNOSIS — N186 End stage renal disease: Secondary | ICD-10-CM | POA: Diagnosis not present

## 2017-01-03 DIAGNOSIS — N186 End stage renal disease: Secondary | ICD-10-CM | POA: Diagnosis not present

## 2017-01-03 DIAGNOSIS — N2581 Secondary hyperparathyroidism of renal origin: Secondary | ICD-10-CM | POA: Diagnosis not present

## 2017-01-03 DIAGNOSIS — D631 Anemia in chronic kidney disease: Secondary | ICD-10-CM | POA: Diagnosis not present

## 2017-01-04 DIAGNOSIS — M5126 Other intervertebral disc displacement, lumbar region: Secondary | ICD-10-CM | POA: Diagnosis not present

## 2017-01-04 DIAGNOSIS — M9902 Segmental and somatic dysfunction of thoracic region: Secondary | ICD-10-CM | POA: Diagnosis not present

## 2017-01-04 DIAGNOSIS — M9901 Segmental and somatic dysfunction of cervical region: Secondary | ICD-10-CM | POA: Diagnosis not present

## 2017-01-04 DIAGNOSIS — M5387 Other specified dorsopathies, lumbosacral region: Secondary | ICD-10-CM | POA: Diagnosis not present

## 2017-01-04 DIAGNOSIS — M9905 Segmental and somatic dysfunction of pelvic region: Secondary | ICD-10-CM | POA: Diagnosis not present

## 2017-01-04 DIAGNOSIS — M461 Sacroiliitis, not elsewhere classified: Secondary | ICD-10-CM | POA: Diagnosis not present

## 2017-01-04 DIAGNOSIS — M5125 Other intervertebral disc displacement, thoracolumbar region: Secondary | ICD-10-CM | POA: Diagnosis not present

## 2017-01-04 DIAGNOSIS — M50322 Other cervical disc degeneration at C5-C6 level: Secondary | ICD-10-CM | POA: Diagnosis not present

## 2017-01-04 DIAGNOSIS — M9904 Segmental and somatic dysfunction of sacral region: Secondary | ICD-10-CM | POA: Diagnosis not present

## 2017-01-04 DIAGNOSIS — M9903 Segmental and somatic dysfunction of lumbar region: Secondary | ICD-10-CM | POA: Diagnosis not present

## 2017-01-05 DIAGNOSIS — D631 Anemia in chronic kidney disease: Secondary | ICD-10-CM | POA: Diagnosis not present

## 2017-01-05 DIAGNOSIS — N186 End stage renal disease: Secondary | ICD-10-CM | POA: Diagnosis not present

## 2017-01-05 DIAGNOSIS — N2581 Secondary hyperparathyroidism of renal origin: Secondary | ICD-10-CM | POA: Diagnosis not present

## 2017-01-08 DIAGNOSIS — N186 End stage renal disease: Secondary | ICD-10-CM | POA: Diagnosis not present

## 2017-01-08 DIAGNOSIS — N2581 Secondary hyperparathyroidism of renal origin: Secondary | ICD-10-CM | POA: Diagnosis not present

## 2017-01-08 DIAGNOSIS — D631 Anemia in chronic kidney disease: Secondary | ICD-10-CM | POA: Diagnosis not present

## 2017-01-10 DIAGNOSIS — D631 Anemia in chronic kidney disease: Secondary | ICD-10-CM | POA: Diagnosis not present

## 2017-01-10 DIAGNOSIS — N2581 Secondary hyperparathyroidism of renal origin: Secondary | ICD-10-CM | POA: Diagnosis not present

## 2017-01-10 DIAGNOSIS — N186 End stage renal disease: Secondary | ICD-10-CM | POA: Diagnosis not present

## 2017-01-11 DIAGNOSIS — M5387 Other specified dorsopathies, lumbosacral region: Secondary | ICD-10-CM | POA: Diagnosis not present

## 2017-01-11 DIAGNOSIS — M5126 Other intervertebral disc displacement, lumbar region: Secondary | ICD-10-CM | POA: Diagnosis not present

## 2017-01-11 DIAGNOSIS — M9903 Segmental and somatic dysfunction of lumbar region: Secondary | ICD-10-CM | POA: Diagnosis not present

## 2017-01-11 DIAGNOSIS — M50322 Other cervical disc degeneration at C5-C6 level: Secondary | ICD-10-CM | POA: Diagnosis not present

## 2017-01-11 DIAGNOSIS — M9901 Segmental and somatic dysfunction of cervical region: Secondary | ICD-10-CM | POA: Diagnosis not present

## 2017-01-11 DIAGNOSIS — M9904 Segmental and somatic dysfunction of sacral region: Secondary | ICD-10-CM | POA: Diagnosis not present

## 2017-01-11 DIAGNOSIS — M5125 Other intervertebral disc displacement, thoracolumbar region: Secondary | ICD-10-CM | POA: Diagnosis not present

## 2017-01-11 DIAGNOSIS — M461 Sacroiliitis, not elsewhere classified: Secondary | ICD-10-CM | POA: Diagnosis not present

## 2017-01-11 DIAGNOSIS — M9902 Segmental and somatic dysfunction of thoracic region: Secondary | ICD-10-CM | POA: Diagnosis not present

## 2017-01-11 DIAGNOSIS — M9905 Segmental and somatic dysfunction of pelvic region: Secondary | ICD-10-CM | POA: Diagnosis not present

## 2017-01-12 DIAGNOSIS — D631 Anemia in chronic kidney disease: Secondary | ICD-10-CM | POA: Diagnosis not present

## 2017-01-12 DIAGNOSIS — N186 End stage renal disease: Secondary | ICD-10-CM | POA: Diagnosis not present

## 2017-01-12 DIAGNOSIS — N2581 Secondary hyperparathyroidism of renal origin: Secondary | ICD-10-CM | POA: Diagnosis not present

## 2017-01-15 DIAGNOSIS — D631 Anemia in chronic kidney disease: Secondary | ICD-10-CM | POA: Diagnosis not present

## 2017-01-15 DIAGNOSIS — N186 End stage renal disease: Secondary | ICD-10-CM | POA: Diagnosis not present

## 2017-01-15 DIAGNOSIS — N2581 Secondary hyperparathyroidism of renal origin: Secondary | ICD-10-CM | POA: Diagnosis not present

## 2017-01-17 DIAGNOSIS — D631 Anemia in chronic kidney disease: Secondary | ICD-10-CM | POA: Diagnosis not present

## 2017-01-17 DIAGNOSIS — N2581 Secondary hyperparathyroidism of renal origin: Secondary | ICD-10-CM | POA: Diagnosis not present

## 2017-01-17 DIAGNOSIS — N186 End stage renal disease: Secondary | ICD-10-CM | POA: Diagnosis not present

## 2017-01-18 DIAGNOSIS — M50322 Other cervical disc degeneration at C5-C6 level: Secondary | ICD-10-CM | POA: Diagnosis not present

## 2017-01-18 DIAGNOSIS — M9903 Segmental and somatic dysfunction of lumbar region: Secondary | ICD-10-CM | POA: Diagnosis not present

## 2017-01-18 DIAGNOSIS — M5126 Other intervertebral disc displacement, lumbar region: Secondary | ICD-10-CM | POA: Diagnosis not present

## 2017-01-18 DIAGNOSIS — M9905 Segmental and somatic dysfunction of pelvic region: Secondary | ICD-10-CM | POA: Diagnosis not present

## 2017-01-18 DIAGNOSIS — M9901 Segmental and somatic dysfunction of cervical region: Secondary | ICD-10-CM | POA: Diagnosis not present

## 2017-01-18 DIAGNOSIS — M5125 Other intervertebral disc displacement, thoracolumbar region: Secondary | ICD-10-CM | POA: Diagnosis not present

## 2017-01-18 DIAGNOSIS — M461 Sacroiliitis, not elsewhere classified: Secondary | ICD-10-CM | POA: Diagnosis not present

## 2017-01-18 DIAGNOSIS — M9902 Segmental and somatic dysfunction of thoracic region: Secondary | ICD-10-CM | POA: Diagnosis not present

## 2017-01-18 DIAGNOSIS — M5387 Other specified dorsopathies, lumbosacral region: Secondary | ICD-10-CM | POA: Diagnosis not present

## 2017-01-18 DIAGNOSIS — M9904 Segmental and somatic dysfunction of sacral region: Secondary | ICD-10-CM | POA: Diagnosis not present

## 2017-01-19 DIAGNOSIS — D631 Anemia in chronic kidney disease: Secondary | ICD-10-CM | POA: Diagnosis not present

## 2017-01-19 DIAGNOSIS — N186 End stage renal disease: Secondary | ICD-10-CM | POA: Diagnosis not present

## 2017-01-19 DIAGNOSIS — N2581 Secondary hyperparathyroidism of renal origin: Secondary | ICD-10-CM | POA: Diagnosis not present

## 2017-01-22 DIAGNOSIS — D631 Anemia in chronic kidney disease: Secondary | ICD-10-CM | POA: Diagnosis not present

## 2017-01-22 DIAGNOSIS — N2581 Secondary hyperparathyroidism of renal origin: Secondary | ICD-10-CM | POA: Diagnosis not present

## 2017-01-22 DIAGNOSIS — N186 End stage renal disease: Secondary | ICD-10-CM | POA: Diagnosis not present

## 2017-01-24 DIAGNOSIS — D631 Anemia in chronic kidney disease: Secondary | ICD-10-CM | POA: Diagnosis not present

## 2017-01-24 DIAGNOSIS — N2581 Secondary hyperparathyroidism of renal origin: Secondary | ICD-10-CM | POA: Diagnosis not present

## 2017-01-24 DIAGNOSIS — N186 End stage renal disease: Secondary | ICD-10-CM | POA: Diagnosis not present

## 2017-01-25 DIAGNOSIS — I120 Hypertensive chronic kidney disease with stage 5 chronic kidney disease or end stage renal disease: Secondary | ICD-10-CM | POA: Diagnosis not present

## 2017-01-25 DIAGNOSIS — M9901 Segmental and somatic dysfunction of cervical region: Secondary | ICD-10-CM | POA: Diagnosis not present

## 2017-01-25 DIAGNOSIS — M5126 Other intervertebral disc displacement, lumbar region: Secondary | ICD-10-CM | POA: Diagnosis not present

## 2017-01-25 DIAGNOSIS — M9905 Segmental and somatic dysfunction of pelvic region: Secondary | ICD-10-CM | POA: Diagnosis not present

## 2017-01-25 DIAGNOSIS — M9903 Segmental and somatic dysfunction of lumbar region: Secondary | ICD-10-CM | POA: Diagnosis not present

## 2017-01-25 DIAGNOSIS — M461 Sacroiliitis, not elsewhere classified: Secondary | ICD-10-CM | POA: Diagnosis not present

## 2017-01-25 DIAGNOSIS — M9902 Segmental and somatic dysfunction of thoracic region: Secondary | ICD-10-CM | POA: Diagnosis not present

## 2017-01-25 DIAGNOSIS — Z992 Dependence on renal dialysis: Secondary | ICD-10-CM | POA: Diagnosis not present

## 2017-01-25 DIAGNOSIS — M5125 Other intervertebral disc displacement, thoracolumbar region: Secondary | ICD-10-CM | POA: Diagnosis not present

## 2017-01-25 DIAGNOSIS — M5387 Other specified dorsopathies, lumbosacral region: Secondary | ICD-10-CM | POA: Diagnosis not present

## 2017-01-25 DIAGNOSIS — M50322 Other cervical disc degeneration at C5-C6 level: Secondary | ICD-10-CM | POA: Diagnosis not present

## 2017-01-25 DIAGNOSIS — M9904 Segmental and somatic dysfunction of sacral region: Secondary | ICD-10-CM | POA: Diagnosis not present

## 2017-01-25 DIAGNOSIS — N186 End stage renal disease: Secondary | ICD-10-CM | POA: Diagnosis not present

## 2017-01-26 DIAGNOSIS — Z23 Encounter for immunization: Secondary | ICD-10-CM | POA: Diagnosis not present

## 2017-01-26 DIAGNOSIS — N186 End stage renal disease: Secondary | ICD-10-CM | POA: Diagnosis not present

## 2017-01-26 DIAGNOSIS — D509 Iron deficiency anemia, unspecified: Secondary | ICD-10-CM | POA: Diagnosis not present

## 2017-01-26 DIAGNOSIS — N2581 Secondary hyperparathyroidism of renal origin: Secondary | ICD-10-CM | POA: Diagnosis not present

## 2017-01-26 DIAGNOSIS — D631 Anemia in chronic kidney disease: Secondary | ICD-10-CM | POA: Diagnosis not present

## 2017-01-29 DIAGNOSIS — D631 Anemia in chronic kidney disease: Secondary | ICD-10-CM | POA: Diagnosis not present

## 2017-01-29 DIAGNOSIS — D509 Iron deficiency anemia, unspecified: Secondary | ICD-10-CM | POA: Diagnosis not present

## 2017-01-29 DIAGNOSIS — N2581 Secondary hyperparathyroidism of renal origin: Secondary | ICD-10-CM | POA: Diagnosis not present

## 2017-01-29 DIAGNOSIS — N186 End stage renal disease: Secondary | ICD-10-CM | POA: Diagnosis not present

## 2017-01-29 DIAGNOSIS — Z23 Encounter for immunization: Secondary | ICD-10-CM | POA: Diagnosis not present

## 2017-01-31 DIAGNOSIS — D509 Iron deficiency anemia, unspecified: Secondary | ICD-10-CM | POA: Diagnosis not present

## 2017-01-31 DIAGNOSIS — N2581 Secondary hyperparathyroidism of renal origin: Secondary | ICD-10-CM | POA: Diagnosis not present

## 2017-01-31 DIAGNOSIS — D631 Anemia in chronic kidney disease: Secondary | ICD-10-CM | POA: Diagnosis not present

## 2017-01-31 DIAGNOSIS — Z23 Encounter for immunization: Secondary | ICD-10-CM | POA: Diagnosis not present

## 2017-01-31 DIAGNOSIS — N186 End stage renal disease: Secondary | ICD-10-CM | POA: Diagnosis not present

## 2017-02-02 DIAGNOSIS — Z23 Encounter for immunization: Secondary | ICD-10-CM | POA: Diagnosis not present

## 2017-02-02 DIAGNOSIS — N2581 Secondary hyperparathyroidism of renal origin: Secondary | ICD-10-CM | POA: Diagnosis not present

## 2017-02-02 DIAGNOSIS — D509 Iron deficiency anemia, unspecified: Secondary | ICD-10-CM | POA: Diagnosis not present

## 2017-02-02 DIAGNOSIS — D631 Anemia in chronic kidney disease: Secondary | ICD-10-CM | POA: Diagnosis not present

## 2017-02-02 DIAGNOSIS — N186 End stage renal disease: Secondary | ICD-10-CM | POA: Diagnosis not present

## 2017-02-05 DIAGNOSIS — N186 End stage renal disease: Secondary | ICD-10-CM | POA: Diagnosis not present

## 2017-02-05 DIAGNOSIS — N2581 Secondary hyperparathyroidism of renal origin: Secondary | ICD-10-CM | POA: Diagnosis not present

## 2017-02-05 DIAGNOSIS — D631 Anemia in chronic kidney disease: Secondary | ICD-10-CM | POA: Diagnosis not present

## 2017-02-05 DIAGNOSIS — D509 Iron deficiency anemia, unspecified: Secondary | ICD-10-CM | POA: Diagnosis not present

## 2017-02-05 DIAGNOSIS — Z23 Encounter for immunization: Secondary | ICD-10-CM | POA: Diagnosis not present

## 2017-02-07 DIAGNOSIS — Z23 Encounter for immunization: Secondary | ICD-10-CM | POA: Diagnosis not present

## 2017-02-07 DIAGNOSIS — D631 Anemia in chronic kidney disease: Secondary | ICD-10-CM | POA: Diagnosis not present

## 2017-02-07 DIAGNOSIS — D509 Iron deficiency anemia, unspecified: Secondary | ICD-10-CM | POA: Diagnosis not present

## 2017-02-07 DIAGNOSIS — N2581 Secondary hyperparathyroidism of renal origin: Secondary | ICD-10-CM | POA: Diagnosis not present

## 2017-02-07 DIAGNOSIS — N186 End stage renal disease: Secondary | ICD-10-CM | POA: Diagnosis not present

## 2017-02-09 DIAGNOSIS — D631 Anemia in chronic kidney disease: Secondary | ICD-10-CM | POA: Diagnosis not present

## 2017-02-09 DIAGNOSIS — N2581 Secondary hyperparathyroidism of renal origin: Secondary | ICD-10-CM | POA: Diagnosis not present

## 2017-02-09 DIAGNOSIS — D509 Iron deficiency anemia, unspecified: Secondary | ICD-10-CM | POA: Diagnosis not present

## 2017-02-09 DIAGNOSIS — Z23 Encounter for immunization: Secondary | ICD-10-CM | POA: Diagnosis not present

## 2017-02-09 DIAGNOSIS — N186 End stage renal disease: Secondary | ICD-10-CM | POA: Diagnosis not present

## 2017-02-12 DIAGNOSIS — D509 Iron deficiency anemia, unspecified: Secondary | ICD-10-CM | POA: Diagnosis not present

## 2017-02-12 DIAGNOSIS — N186 End stage renal disease: Secondary | ICD-10-CM | POA: Diagnosis not present

## 2017-02-12 DIAGNOSIS — Z23 Encounter for immunization: Secondary | ICD-10-CM | POA: Diagnosis not present

## 2017-02-12 DIAGNOSIS — N2581 Secondary hyperparathyroidism of renal origin: Secondary | ICD-10-CM | POA: Diagnosis not present

## 2017-02-12 DIAGNOSIS — D631 Anemia in chronic kidney disease: Secondary | ICD-10-CM | POA: Diagnosis not present

## 2017-02-14 DIAGNOSIS — D509 Iron deficiency anemia, unspecified: Secondary | ICD-10-CM | POA: Diagnosis not present

## 2017-02-14 DIAGNOSIS — N186 End stage renal disease: Secondary | ICD-10-CM | POA: Diagnosis not present

## 2017-02-14 DIAGNOSIS — N2581 Secondary hyperparathyroidism of renal origin: Secondary | ICD-10-CM | POA: Diagnosis not present

## 2017-02-14 DIAGNOSIS — Z23 Encounter for immunization: Secondary | ICD-10-CM | POA: Diagnosis not present

## 2017-02-14 DIAGNOSIS — D631 Anemia in chronic kidney disease: Secondary | ICD-10-CM | POA: Diagnosis not present

## 2017-02-16 DIAGNOSIS — D631 Anemia in chronic kidney disease: Secondary | ICD-10-CM | POA: Diagnosis not present

## 2017-02-16 DIAGNOSIS — D509 Iron deficiency anemia, unspecified: Secondary | ICD-10-CM | POA: Diagnosis not present

## 2017-02-16 DIAGNOSIS — N2581 Secondary hyperparathyroidism of renal origin: Secondary | ICD-10-CM | POA: Diagnosis not present

## 2017-02-16 DIAGNOSIS — N186 End stage renal disease: Secondary | ICD-10-CM | POA: Diagnosis not present

## 2017-02-16 DIAGNOSIS — Z23 Encounter for immunization: Secondary | ICD-10-CM | POA: Diagnosis not present

## 2017-02-19 DIAGNOSIS — N186 End stage renal disease: Secondary | ICD-10-CM | POA: Diagnosis not present

## 2017-02-19 DIAGNOSIS — D631 Anemia in chronic kidney disease: Secondary | ICD-10-CM | POA: Diagnosis not present

## 2017-02-19 DIAGNOSIS — N2581 Secondary hyperparathyroidism of renal origin: Secondary | ICD-10-CM | POA: Diagnosis not present

## 2017-02-19 DIAGNOSIS — D509 Iron deficiency anemia, unspecified: Secondary | ICD-10-CM | POA: Diagnosis not present

## 2017-02-19 DIAGNOSIS — Z23 Encounter for immunization: Secondary | ICD-10-CM | POA: Diagnosis not present

## 2017-02-21 DIAGNOSIS — Z23 Encounter for immunization: Secondary | ICD-10-CM | POA: Diagnosis not present

## 2017-02-21 DIAGNOSIS — N186 End stage renal disease: Secondary | ICD-10-CM | POA: Diagnosis not present

## 2017-02-21 DIAGNOSIS — N2581 Secondary hyperparathyroidism of renal origin: Secondary | ICD-10-CM | POA: Diagnosis not present

## 2017-02-21 DIAGNOSIS — D509 Iron deficiency anemia, unspecified: Secondary | ICD-10-CM | POA: Diagnosis not present

## 2017-02-21 DIAGNOSIS — D631 Anemia in chronic kidney disease: Secondary | ICD-10-CM | POA: Diagnosis not present

## 2017-02-22 DIAGNOSIS — Z0000 Encounter for general adult medical examination without abnormal findings: Secondary | ICD-10-CM | POA: Diagnosis not present

## 2017-02-22 DIAGNOSIS — Z6833 Body mass index (BMI) 33.0-33.9, adult: Secondary | ICD-10-CM | POA: Diagnosis not present

## 2017-02-22 DIAGNOSIS — Z9181 History of falling: Secondary | ICD-10-CM | POA: Diagnosis not present

## 2017-02-22 DIAGNOSIS — Z1389 Encounter for screening for other disorder: Secondary | ICD-10-CM | POA: Diagnosis not present

## 2017-02-22 DIAGNOSIS — Z125 Encounter for screening for malignant neoplasm of prostate: Secondary | ICD-10-CM | POA: Diagnosis not present

## 2017-02-22 DIAGNOSIS — Z136 Encounter for screening for cardiovascular disorders: Secondary | ICD-10-CM | POA: Diagnosis not present

## 2017-02-23 DIAGNOSIS — N2581 Secondary hyperparathyroidism of renal origin: Secondary | ICD-10-CM | POA: Diagnosis not present

## 2017-02-23 DIAGNOSIS — N186 End stage renal disease: Secondary | ICD-10-CM | POA: Diagnosis not present

## 2017-02-23 DIAGNOSIS — D631 Anemia in chronic kidney disease: Secondary | ICD-10-CM | POA: Diagnosis not present

## 2017-02-23 DIAGNOSIS — D509 Iron deficiency anemia, unspecified: Secondary | ICD-10-CM | POA: Diagnosis not present

## 2017-02-23 DIAGNOSIS — Z23 Encounter for immunization: Secondary | ICD-10-CM | POA: Diagnosis not present

## 2017-02-24 DIAGNOSIS — Z992 Dependence on renal dialysis: Secondary | ICD-10-CM | POA: Diagnosis not present

## 2017-02-24 DIAGNOSIS — I120 Hypertensive chronic kidney disease with stage 5 chronic kidney disease or end stage renal disease: Secondary | ICD-10-CM | POA: Diagnosis not present

## 2017-02-24 DIAGNOSIS — N186 End stage renal disease: Secondary | ICD-10-CM | POA: Diagnosis not present

## 2017-02-26 DIAGNOSIS — N186 End stage renal disease: Secondary | ICD-10-CM | POA: Diagnosis not present

## 2017-02-26 DIAGNOSIS — N2581 Secondary hyperparathyroidism of renal origin: Secondary | ICD-10-CM | POA: Diagnosis not present

## 2017-02-28 DIAGNOSIS — N186 End stage renal disease: Secondary | ICD-10-CM | POA: Diagnosis not present

## 2017-02-28 DIAGNOSIS — N2581 Secondary hyperparathyroidism of renal origin: Secondary | ICD-10-CM | POA: Diagnosis not present

## 2017-03-02 DIAGNOSIS — N186 End stage renal disease: Secondary | ICD-10-CM | POA: Diagnosis not present

## 2017-03-02 DIAGNOSIS — N2581 Secondary hyperparathyroidism of renal origin: Secondary | ICD-10-CM | POA: Diagnosis not present

## 2017-03-05 DIAGNOSIS — N186 End stage renal disease: Secondary | ICD-10-CM | POA: Diagnosis not present

## 2017-03-05 DIAGNOSIS — N2581 Secondary hyperparathyroidism of renal origin: Secondary | ICD-10-CM | POA: Diagnosis not present

## 2017-03-07 DIAGNOSIS — N186 End stage renal disease: Secondary | ICD-10-CM | POA: Diagnosis not present

## 2017-03-07 DIAGNOSIS — N2581 Secondary hyperparathyroidism of renal origin: Secondary | ICD-10-CM | POA: Diagnosis not present

## 2017-03-08 ENCOUNTER — Ambulatory Visit (INDEPENDENT_AMBULATORY_CARE_PROVIDER_SITE_OTHER): Payer: Medicare Other

## 2017-03-08 ENCOUNTER — Ambulatory Visit (INDEPENDENT_AMBULATORY_CARE_PROVIDER_SITE_OTHER): Payer: Medicare Other | Admitting: Orthopaedic Surgery

## 2017-03-08 DIAGNOSIS — M545 Low back pain, unspecified: Secondary | ICD-10-CM

## 2017-03-08 DIAGNOSIS — G8929 Other chronic pain: Secondary | ICD-10-CM

## 2017-03-08 MED ORDER — METHYLPREDNISOLONE 4 MG PO TABS: ORAL_TABLET | ORAL | 0 refills | Status: DC

## 2017-03-08 MED ORDER — GABAPENTIN 100 MG PO CAPS: 300.0000 mg | ORAL_CAPSULE | Freq: Two times a day (BID) | ORAL | 0 refills | Status: DC

## 2017-03-08 NOTE — Progress Notes (Signed)
The patient is someone I've seen in the past but is been a long period time. He is someone who comes in today with chronic thoracic and lumbar spine pain. He is been to 19 visits to a chiropractor and has not helped at all. He is someone who has kidney disease and is on dialysis 3 days a week so he can't take anti-inflammatories. He was told in the past he had multiple bone spurs in his back and recommendation was injections. He does take Neurontin 300 mg just at night. He denies any radicular components but occasionally does get some pain in this legs.  On exam he has limited flexion-extension lumbar and thoracic spines pain. He has multiple trigger point pain areas in the thoracic and lumbar spine. 2 views of the lumbar spine show multilevel spondylosis with no significant malalignment. There is a lot of calcifications in the bowel areas but he does have a colonoscopy coming up and he does have significant renal disease.  At this point, have him go up on his Neurontin 3 mg twice daily as well as try steroid taper. He should try over-the-counter topical anti- inflammatories as well. We'll see if we can get an MRI of his thoracic and lumbar spine approved he can help shed light on to why he hurts a weighted he does.

## 2017-03-09 ENCOUNTER — Other Ambulatory Visit (INDEPENDENT_AMBULATORY_CARE_PROVIDER_SITE_OTHER): Payer: Self-pay

## 2017-03-09 DIAGNOSIS — G8929 Other chronic pain: Secondary | ICD-10-CM

## 2017-03-09 DIAGNOSIS — M549 Dorsalgia, unspecified: Secondary | ICD-10-CM

## 2017-03-09 DIAGNOSIS — N2581 Secondary hyperparathyroidism of renal origin: Secondary | ICD-10-CM | POA: Diagnosis not present

## 2017-03-09 DIAGNOSIS — M545 Low back pain: Principal | ICD-10-CM

## 2017-03-09 DIAGNOSIS — N186 End stage renal disease: Secondary | ICD-10-CM | POA: Diagnosis not present

## 2017-03-12 DIAGNOSIS — N2581 Secondary hyperparathyroidism of renal origin: Secondary | ICD-10-CM | POA: Diagnosis not present

## 2017-03-12 DIAGNOSIS — N186 End stage renal disease: Secondary | ICD-10-CM | POA: Diagnosis not present

## 2017-03-14 DIAGNOSIS — N2581 Secondary hyperparathyroidism of renal origin: Secondary | ICD-10-CM | POA: Diagnosis not present

## 2017-03-14 DIAGNOSIS — N186 End stage renal disease: Secondary | ICD-10-CM | POA: Diagnosis not present

## 2017-03-16 DIAGNOSIS — N186 End stage renal disease: Secondary | ICD-10-CM | POA: Diagnosis not present

## 2017-03-16 DIAGNOSIS — N2581 Secondary hyperparathyroidism of renal origin: Secondary | ICD-10-CM | POA: Diagnosis not present

## 2017-03-19 DIAGNOSIS — N2581 Secondary hyperparathyroidism of renal origin: Secondary | ICD-10-CM | POA: Diagnosis not present

## 2017-03-19 DIAGNOSIS — N186 End stage renal disease: Secondary | ICD-10-CM | POA: Diagnosis not present

## 2017-03-21 DIAGNOSIS — N186 End stage renal disease: Secondary | ICD-10-CM | POA: Diagnosis not present

## 2017-03-21 DIAGNOSIS — N2581 Secondary hyperparathyroidism of renal origin: Secondary | ICD-10-CM | POA: Diagnosis not present

## 2017-03-22 ENCOUNTER — Ambulatory Visit (INDEPENDENT_AMBULATORY_CARE_PROVIDER_SITE_OTHER): Payer: Medicare Other | Admitting: Orthopaedic Surgery

## 2017-03-23 DIAGNOSIS — N2581 Secondary hyperparathyroidism of renal origin: Secondary | ICD-10-CM | POA: Diagnosis not present

## 2017-03-23 DIAGNOSIS — N186 End stage renal disease: Secondary | ICD-10-CM | POA: Diagnosis not present

## 2017-03-25 DIAGNOSIS — Z1211 Encounter for screening for malignant neoplasm of colon: Secondary | ICD-10-CM | POA: Diagnosis not present

## 2017-03-25 DIAGNOSIS — Z1212 Encounter for screening for malignant neoplasm of rectum: Secondary | ICD-10-CM | POA: Diagnosis not present

## 2017-03-26 DIAGNOSIS — N186 End stage renal disease: Secondary | ICD-10-CM | POA: Diagnosis not present

## 2017-03-26 DIAGNOSIS — N2581 Secondary hyperparathyroidism of renal origin: Secondary | ICD-10-CM | POA: Diagnosis not present

## 2017-03-27 ENCOUNTER — Ambulatory Visit
Admission: RE | Admit: 2017-03-27 | Discharge: 2017-03-27 | Disposition: A | Discharge status: Home / Self Care | Payer: Medicare Other | Source: Ambulatory Visit | Attending: Orthopaedic Surgery | Admitting: Orthopaedic Surgery

## 2017-03-27 ENCOUNTER — Other Ambulatory Visit: Payer: Medicare Other

## 2017-03-27 DIAGNOSIS — M545 Low back pain: Principal | ICD-10-CM

## 2017-03-27 DIAGNOSIS — Z992 Dependence on renal dialysis: Secondary | ICD-10-CM | POA: Diagnosis not present

## 2017-03-27 DIAGNOSIS — M4804 Spinal stenosis, thoracic region: Secondary | ICD-10-CM | POA: Diagnosis not present

## 2017-03-27 DIAGNOSIS — M48061 Spinal stenosis, lumbar region without neurogenic claudication: Secondary | ICD-10-CM | POA: Diagnosis not present

## 2017-03-27 DIAGNOSIS — H40053 Ocular hypertension, bilateral: Secondary | ICD-10-CM | POA: Diagnosis not present

## 2017-03-27 DIAGNOSIS — N186 End stage renal disease: Secondary | ICD-10-CM | POA: Diagnosis not present

## 2017-03-27 DIAGNOSIS — I120 Hypertensive chronic kidney disease with stage 5 chronic kidney disease or end stage renal disease: Secondary | ICD-10-CM | POA: Diagnosis not present

## 2017-03-27 DIAGNOSIS — M549 Dorsalgia, unspecified: Secondary | ICD-10-CM

## 2017-03-27 DIAGNOSIS — G8929 Other chronic pain: Secondary | ICD-10-CM

## 2017-03-28 DIAGNOSIS — N186 End stage renal disease: Secondary | ICD-10-CM | POA: Diagnosis not present

## 2017-03-28 DIAGNOSIS — D631 Anemia in chronic kidney disease: Secondary | ICD-10-CM | POA: Diagnosis not present

## 2017-03-28 DIAGNOSIS — N2581 Secondary hyperparathyroidism of renal origin: Secondary | ICD-10-CM | POA: Diagnosis not present

## 2017-03-29 ENCOUNTER — Ambulatory Visit (INDEPENDENT_AMBULATORY_CARE_PROVIDER_SITE_OTHER): Payer: Medicare Other | Admitting: Orthopaedic Surgery

## 2017-03-29 ENCOUNTER — Encounter (INDEPENDENT_AMBULATORY_CARE_PROVIDER_SITE_OTHER): Payer: Self-pay | Admitting: Orthopaedic Surgery

## 2017-03-29 DIAGNOSIS — M5124 Other intervertebral disc displacement, thoracic region: Secondary | ICD-10-CM | POA: Diagnosis not present

## 2017-03-29 DIAGNOSIS — M545 Low back pain, unspecified: Secondary | ICD-10-CM

## 2017-03-29 DIAGNOSIS — G8929 Other chronic pain: Secondary | ICD-10-CM | POA: Diagnosis not present

## 2017-03-29 NOTE — Addendum Note (Signed)
Addended by: Michae Kava B on: 03/29/2017 05:16 PM   Modules accepted: Orders

## 2017-03-29 NOTE — Progress Notes (Signed)
The patient is a 51 year old gentleman chronic neck, thoracic, and low back pain. It's been more list having pain in his thoracic spine. He is not taking any narcotic pain medications from [place]. He does have severe renal disease. He said injections in the past in his back at this point would like to consider seeing a back specialist to see if there is any other options for him. We did send him for an MRI of the thoracic spine in the lumbar spine given the amount of pain he's been having. He is here for review these today. He still has significant amount of pain with flexion-extension thoracic and lumbar spines. He has got excellent strength in the bilateral upper and lower extremities today.  MRI of his thoracic spine does show a large central disc herniation at T6-T7 that is flattening the cord. Surprisingly they do not note stenosis significantly at this level. Otherwise he has multilevel degenerative disease of the thoracic and lumbar spine. He will likely have this and his cervical spine as well.  I would like at least to have him have a single visit with Dr.Nitka to see if there is any intervention is needed at the thoracic spine level.

## 2017-03-30 DIAGNOSIS — N186 End stage renal disease: Secondary | ICD-10-CM | POA: Diagnosis not present

## 2017-03-30 DIAGNOSIS — D631 Anemia in chronic kidney disease: Secondary | ICD-10-CM | POA: Diagnosis not present

## 2017-03-30 DIAGNOSIS — N2581 Secondary hyperparathyroidism of renal origin: Secondary | ICD-10-CM | POA: Diagnosis not present

## 2017-04-02 DIAGNOSIS — N186 End stage renal disease: Secondary | ICD-10-CM | POA: Diagnosis not present

## 2017-04-02 DIAGNOSIS — N2581 Secondary hyperparathyroidism of renal origin: Secondary | ICD-10-CM | POA: Diagnosis not present

## 2017-04-02 DIAGNOSIS — D631 Anemia in chronic kidney disease: Secondary | ICD-10-CM | POA: Diagnosis not present

## 2017-04-04 DIAGNOSIS — N2581 Secondary hyperparathyroidism of renal origin: Secondary | ICD-10-CM | POA: Diagnosis not present

## 2017-04-04 DIAGNOSIS — N186 End stage renal disease: Secondary | ICD-10-CM | POA: Diagnosis not present

## 2017-04-04 DIAGNOSIS — D631 Anemia in chronic kidney disease: Secondary | ICD-10-CM | POA: Diagnosis not present

## 2017-04-06 DIAGNOSIS — D631 Anemia in chronic kidney disease: Secondary | ICD-10-CM | POA: Diagnosis not present

## 2017-04-06 DIAGNOSIS — N186 End stage renal disease: Secondary | ICD-10-CM | POA: Diagnosis not present

## 2017-04-06 DIAGNOSIS — N2581 Secondary hyperparathyroidism of renal origin: Secondary | ICD-10-CM | POA: Diagnosis not present

## 2017-04-09 DIAGNOSIS — N2581 Secondary hyperparathyroidism of renal origin: Secondary | ICD-10-CM | POA: Diagnosis not present

## 2017-04-09 DIAGNOSIS — D631 Anemia in chronic kidney disease: Secondary | ICD-10-CM | POA: Diagnosis not present

## 2017-04-09 DIAGNOSIS — N186 End stage renal disease: Secondary | ICD-10-CM | POA: Diagnosis not present

## 2017-04-10 DIAGNOSIS — L03114 Cellulitis of left upper limb: Secondary | ICD-10-CM | POA: Diagnosis not present

## 2017-04-10 DIAGNOSIS — Z6833 Body mass index (BMI) 33.0-33.9, adult: Secondary | ICD-10-CM | POA: Diagnosis not present

## 2017-04-11 DIAGNOSIS — N2581 Secondary hyperparathyroidism of renal origin: Secondary | ICD-10-CM | POA: Diagnosis not present

## 2017-04-11 DIAGNOSIS — D631 Anemia in chronic kidney disease: Secondary | ICD-10-CM | POA: Diagnosis not present

## 2017-04-11 DIAGNOSIS — N186 End stage renal disease: Secondary | ICD-10-CM | POA: Diagnosis not present

## 2017-04-13 DIAGNOSIS — D631 Anemia in chronic kidney disease: Secondary | ICD-10-CM | POA: Diagnosis not present

## 2017-04-13 DIAGNOSIS — N2581 Secondary hyperparathyroidism of renal origin: Secondary | ICD-10-CM | POA: Diagnosis not present

## 2017-04-13 DIAGNOSIS — N186 End stage renal disease: Secondary | ICD-10-CM | POA: Diagnosis not present

## 2017-04-16 DIAGNOSIS — N2581 Secondary hyperparathyroidism of renal origin: Secondary | ICD-10-CM | POA: Diagnosis not present

## 2017-04-16 DIAGNOSIS — D631 Anemia in chronic kidney disease: Secondary | ICD-10-CM | POA: Diagnosis not present

## 2017-04-16 DIAGNOSIS — N186 End stage renal disease: Secondary | ICD-10-CM | POA: Diagnosis not present

## 2017-04-18 DIAGNOSIS — N186 End stage renal disease: Secondary | ICD-10-CM | POA: Diagnosis not present

## 2017-04-18 DIAGNOSIS — N2581 Secondary hyperparathyroidism of renal origin: Secondary | ICD-10-CM | POA: Diagnosis not present

## 2017-04-18 DIAGNOSIS — D631 Anemia in chronic kidney disease: Secondary | ICD-10-CM | POA: Diagnosis not present

## 2017-04-20 DIAGNOSIS — N186 End stage renal disease: Secondary | ICD-10-CM | POA: Diagnosis not present

## 2017-04-20 DIAGNOSIS — D631 Anemia in chronic kidney disease: Secondary | ICD-10-CM | POA: Diagnosis not present

## 2017-04-20 DIAGNOSIS — N2581 Secondary hyperparathyroidism of renal origin: Secondary | ICD-10-CM | POA: Diagnosis not present

## 2017-04-23 DIAGNOSIS — N186 End stage renal disease: Secondary | ICD-10-CM | POA: Diagnosis not present

## 2017-04-23 DIAGNOSIS — N2581 Secondary hyperparathyroidism of renal origin: Secondary | ICD-10-CM | POA: Diagnosis not present

## 2017-04-23 DIAGNOSIS — D631 Anemia in chronic kidney disease: Secondary | ICD-10-CM | POA: Diagnosis not present

## 2017-04-25 DIAGNOSIS — D631 Anemia in chronic kidney disease: Secondary | ICD-10-CM | POA: Diagnosis not present

## 2017-04-25 DIAGNOSIS — N2581 Secondary hyperparathyroidism of renal origin: Secondary | ICD-10-CM | POA: Diagnosis not present

## 2017-04-25 DIAGNOSIS — N186 End stage renal disease: Secondary | ICD-10-CM | POA: Diagnosis not present

## 2017-04-26 DIAGNOSIS — K5900 Constipation, unspecified: Secondary | ICD-10-CM | POA: Diagnosis not present

## 2017-04-26 DIAGNOSIS — K625 Hemorrhage of anus and rectum: Secondary | ICD-10-CM | POA: Diagnosis not present

## 2017-04-26 DIAGNOSIS — R195 Other fecal abnormalities: Secondary | ICD-10-CM | POA: Diagnosis not present

## 2017-04-27 DIAGNOSIS — Z992 Dependence on renal dialysis: Secondary | ICD-10-CM | POA: Diagnosis not present

## 2017-04-27 DIAGNOSIS — N186 End stage renal disease: Secondary | ICD-10-CM | POA: Diagnosis not present

## 2017-04-27 DIAGNOSIS — D631 Anemia in chronic kidney disease: Secondary | ICD-10-CM | POA: Diagnosis not present

## 2017-04-27 DIAGNOSIS — N2581 Secondary hyperparathyroidism of renal origin: Secondary | ICD-10-CM | POA: Diagnosis not present

## 2017-04-27 DIAGNOSIS — I120 Hypertensive chronic kidney disease with stage 5 chronic kidney disease or end stage renal disease: Secondary | ICD-10-CM | POA: Diagnosis not present

## 2017-04-30 DIAGNOSIS — D631 Anemia in chronic kidney disease: Secondary | ICD-10-CM | POA: Diagnosis not present

## 2017-04-30 DIAGNOSIS — N2581 Secondary hyperparathyroidism of renal origin: Secondary | ICD-10-CM | POA: Diagnosis not present

## 2017-04-30 DIAGNOSIS — Z23 Encounter for immunization: Secondary | ICD-10-CM | POA: Diagnosis not present

## 2017-04-30 DIAGNOSIS — N186 End stage renal disease: Secondary | ICD-10-CM | POA: Diagnosis not present

## 2017-05-02 DIAGNOSIS — Z23 Encounter for immunization: Secondary | ICD-10-CM | POA: Diagnosis not present

## 2017-05-02 DIAGNOSIS — N2581 Secondary hyperparathyroidism of renal origin: Secondary | ICD-10-CM | POA: Diagnosis not present

## 2017-05-02 DIAGNOSIS — N186 End stage renal disease: Secondary | ICD-10-CM | POA: Diagnosis not present

## 2017-05-02 DIAGNOSIS — D631 Anemia in chronic kidney disease: Secondary | ICD-10-CM | POA: Diagnosis not present

## 2017-05-04 DIAGNOSIS — K921 Melena: Secondary | ICD-10-CM | POA: Diagnosis not present

## 2017-05-04 DIAGNOSIS — R195 Other fecal abnormalities: Secondary | ICD-10-CM | POA: Diagnosis not present

## 2017-05-04 DIAGNOSIS — K5900 Constipation, unspecified: Secondary | ICD-10-CM | POA: Diagnosis not present

## 2017-05-04 DIAGNOSIS — K625 Hemorrhage of anus and rectum: Secondary | ICD-10-CM | POA: Diagnosis not present

## 2017-05-05 DIAGNOSIS — N186 End stage renal disease: Secondary | ICD-10-CM | POA: Diagnosis not present

## 2017-05-05 DIAGNOSIS — D631 Anemia in chronic kidney disease: Secondary | ICD-10-CM | POA: Diagnosis not present

## 2017-05-05 DIAGNOSIS — Z23 Encounter for immunization: Secondary | ICD-10-CM | POA: Diagnosis not present

## 2017-05-05 DIAGNOSIS — N2581 Secondary hyperparathyroidism of renal origin: Secondary | ICD-10-CM | POA: Diagnosis not present

## 2017-05-07 DIAGNOSIS — N186 End stage renal disease: Secondary | ICD-10-CM | POA: Diagnosis not present

## 2017-05-07 DIAGNOSIS — Z23 Encounter for immunization: Secondary | ICD-10-CM | POA: Diagnosis not present

## 2017-05-07 DIAGNOSIS — D631 Anemia in chronic kidney disease: Secondary | ICD-10-CM | POA: Diagnosis not present

## 2017-05-07 DIAGNOSIS — N2581 Secondary hyperparathyroidism of renal origin: Secondary | ICD-10-CM | POA: Diagnosis not present

## 2017-05-09 DIAGNOSIS — N186 End stage renal disease: Secondary | ICD-10-CM | POA: Diagnosis not present

## 2017-05-09 DIAGNOSIS — D631 Anemia in chronic kidney disease: Secondary | ICD-10-CM | POA: Diagnosis not present

## 2017-05-09 DIAGNOSIS — N2581 Secondary hyperparathyroidism of renal origin: Secondary | ICD-10-CM | POA: Diagnosis not present

## 2017-05-09 DIAGNOSIS — Z23 Encounter for immunization: Secondary | ICD-10-CM | POA: Diagnosis not present

## 2017-05-11 DIAGNOSIS — N2581 Secondary hyperparathyroidism of renal origin: Secondary | ICD-10-CM | POA: Diagnosis not present

## 2017-05-11 DIAGNOSIS — D631 Anemia in chronic kidney disease: Secondary | ICD-10-CM | POA: Diagnosis not present

## 2017-05-11 DIAGNOSIS — Z23 Encounter for immunization: Secondary | ICD-10-CM | POA: Diagnosis not present

## 2017-05-11 DIAGNOSIS — N186 End stage renal disease: Secondary | ICD-10-CM | POA: Diagnosis not present

## 2017-05-14 DIAGNOSIS — D631 Anemia in chronic kidney disease: Secondary | ICD-10-CM | POA: Diagnosis not present

## 2017-05-14 DIAGNOSIS — Z23 Encounter for immunization: Secondary | ICD-10-CM | POA: Diagnosis not present

## 2017-05-14 DIAGNOSIS — N2581 Secondary hyperparathyroidism of renal origin: Secondary | ICD-10-CM | POA: Diagnosis not present

## 2017-05-14 DIAGNOSIS — N186 End stage renal disease: Secondary | ICD-10-CM | POA: Diagnosis not present

## 2017-05-16 DIAGNOSIS — Z23 Encounter for immunization: Secondary | ICD-10-CM | POA: Diagnosis not present

## 2017-05-16 DIAGNOSIS — N2581 Secondary hyperparathyroidism of renal origin: Secondary | ICD-10-CM | POA: Diagnosis not present

## 2017-05-16 DIAGNOSIS — D631 Anemia in chronic kidney disease: Secondary | ICD-10-CM | POA: Diagnosis not present

## 2017-05-16 DIAGNOSIS — N186 End stage renal disease: Secondary | ICD-10-CM | POA: Diagnosis not present

## 2017-05-18 DIAGNOSIS — N186 End stage renal disease: Secondary | ICD-10-CM | POA: Diagnosis not present

## 2017-05-18 DIAGNOSIS — Z23 Encounter for immunization: Secondary | ICD-10-CM | POA: Diagnosis not present

## 2017-05-18 DIAGNOSIS — D631 Anemia in chronic kidney disease: Secondary | ICD-10-CM | POA: Diagnosis not present

## 2017-05-18 DIAGNOSIS — N2581 Secondary hyperparathyroidism of renal origin: Secondary | ICD-10-CM | POA: Diagnosis not present

## 2017-05-21 DIAGNOSIS — D631 Anemia in chronic kidney disease: Secondary | ICD-10-CM | POA: Diagnosis not present

## 2017-05-21 DIAGNOSIS — Z23 Encounter for immunization: Secondary | ICD-10-CM | POA: Diagnosis not present

## 2017-05-21 DIAGNOSIS — N186 End stage renal disease: Secondary | ICD-10-CM | POA: Diagnosis not present

## 2017-05-21 DIAGNOSIS — N2581 Secondary hyperparathyroidism of renal origin: Secondary | ICD-10-CM | POA: Diagnosis not present

## 2017-05-23 DIAGNOSIS — N2581 Secondary hyperparathyroidism of renal origin: Secondary | ICD-10-CM | POA: Diagnosis not present

## 2017-05-23 DIAGNOSIS — N186 End stage renal disease: Secondary | ICD-10-CM | POA: Diagnosis not present

## 2017-05-23 DIAGNOSIS — Z23 Encounter for immunization: Secondary | ICD-10-CM | POA: Diagnosis not present

## 2017-05-23 DIAGNOSIS — D631 Anemia in chronic kidney disease: Secondary | ICD-10-CM | POA: Diagnosis not present

## 2017-05-25 DIAGNOSIS — D631 Anemia in chronic kidney disease: Secondary | ICD-10-CM | POA: Diagnosis not present

## 2017-05-25 DIAGNOSIS — N2581 Secondary hyperparathyroidism of renal origin: Secondary | ICD-10-CM | POA: Diagnosis not present

## 2017-05-25 DIAGNOSIS — N186 End stage renal disease: Secondary | ICD-10-CM | POA: Diagnosis not present

## 2017-05-25 DIAGNOSIS — Z23 Encounter for immunization: Secondary | ICD-10-CM | POA: Diagnosis not present

## 2017-05-27 DIAGNOSIS — N186 End stage renal disease: Secondary | ICD-10-CM | POA: Diagnosis not present

## 2017-05-27 DIAGNOSIS — Z992 Dependence on renal dialysis: Secondary | ICD-10-CM | POA: Diagnosis not present

## 2017-05-27 DIAGNOSIS — I120 Hypertensive chronic kidney disease with stage 5 chronic kidney disease or end stage renal disease: Secondary | ICD-10-CM | POA: Diagnosis not present

## 2017-05-28 DIAGNOSIS — D631 Anemia in chronic kidney disease: Secondary | ICD-10-CM | POA: Diagnosis not present

## 2017-05-28 DIAGNOSIS — N186 End stage renal disease: Secondary | ICD-10-CM | POA: Diagnosis not present

## 2017-05-28 DIAGNOSIS — E8779 Other fluid overload: Secondary | ICD-10-CM | POA: Diagnosis not present

## 2017-05-28 DIAGNOSIS — D509 Iron deficiency anemia, unspecified: Secondary | ICD-10-CM | POA: Diagnosis not present

## 2017-05-28 DIAGNOSIS — N2581 Secondary hyperparathyroidism of renal origin: Secondary | ICD-10-CM | POA: Diagnosis not present

## 2017-05-30 DIAGNOSIS — D509 Iron deficiency anemia, unspecified: Secondary | ICD-10-CM | POA: Diagnosis not present

## 2017-05-30 DIAGNOSIS — E8779 Other fluid overload: Secondary | ICD-10-CM | POA: Diagnosis not present

## 2017-05-30 DIAGNOSIS — N186 End stage renal disease: Secondary | ICD-10-CM | POA: Diagnosis not present

## 2017-05-30 DIAGNOSIS — D631 Anemia in chronic kidney disease: Secondary | ICD-10-CM | POA: Diagnosis not present

## 2017-05-30 DIAGNOSIS — N2581 Secondary hyperparathyroidism of renal origin: Secondary | ICD-10-CM | POA: Diagnosis not present

## 2017-06-01 DIAGNOSIS — N2581 Secondary hyperparathyroidism of renal origin: Secondary | ICD-10-CM | POA: Diagnosis not present

## 2017-06-01 DIAGNOSIS — D631 Anemia in chronic kidney disease: Secondary | ICD-10-CM | POA: Diagnosis not present

## 2017-06-01 DIAGNOSIS — D509 Iron deficiency anemia, unspecified: Secondary | ICD-10-CM | POA: Diagnosis not present

## 2017-06-01 DIAGNOSIS — N186 End stage renal disease: Secondary | ICD-10-CM | POA: Diagnosis not present

## 2017-06-01 DIAGNOSIS — E8779 Other fluid overload: Secondary | ICD-10-CM | POA: Diagnosis not present

## 2017-06-04 DIAGNOSIS — N186 End stage renal disease: Secondary | ICD-10-CM | POA: Diagnosis not present

## 2017-06-04 DIAGNOSIS — D631 Anemia in chronic kidney disease: Secondary | ICD-10-CM | POA: Diagnosis not present

## 2017-06-04 DIAGNOSIS — E8779 Other fluid overload: Secondary | ICD-10-CM | POA: Diagnosis not present

## 2017-06-04 DIAGNOSIS — D509 Iron deficiency anemia, unspecified: Secondary | ICD-10-CM | POA: Diagnosis not present

## 2017-06-04 DIAGNOSIS — N2581 Secondary hyperparathyroidism of renal origin: Secondary | ICD-10-CM | POA: Diagnosis not present

## 2017-06-06 DIAGNOSIS — E8779 Other fluid overload: Secondary | ICD-10-CM | POA: Diagnosis not present

## 2017-06-06 DIAGNOSIS — N186 End stage renal disease: Secondary | ICD-10-CM | POA: Diagnosis not present

## 2017-06-06 DIAGNOSIS — N2581 Secondary hyperparathyroidism of renal origin: Secondary | ICD-10-CM | POA: Diagnosis not present

## 2017-06-06 DIAGNOSIS — D631 Anemia in chronic kidney disease: Secondary | ICD-10-CM | POA: Diagnosis not present

## 2017-06-06 DIAGNOSIS — D509 Iron deficiency anemia, unspecified: Secondary | ICD-10-CM | POA: Diagnosis not present

## 2017-06-08 DIAGNOSIS — N2581 Secondary hyperparathyroidism of renal origin: Secondary | ICD-10-CM | POA: Diagnosis not present

## 2017-06-08 DIAGNOSIS — E8779 Other fluid overload: Secondary | ICD-10-CM | POA: Diagnosis not present

## 2017-06-08 DIAGNOSIS — D631 Anemia in chronic kidney disease: Secondary | ICD-10-CM | POA: Diagnosis not present

## 2017-06-08 DIAGNOSIS — D509 Iron deficiency anemia, unspecified: Secondary | ICD-10-CM | POA: Diagnosis not present

## 2017-06-08 DIAGNOSIS — N186 End stage renal disease: Secondary | ICD-10-CM | POA: Diagnosis not present

## 2017-06-11 DIAGNOSIS — E8779 Other fluid overload: Secondary | ICD-10-CM | POA: Diagnosis not present

## 2017-06-11 DIAGNOSIS — N186 End stage renal disease: Secondary | ICD-10-CM | POA: Diagnosis not present

## 2017-06-11 DIAGNOSIS — D509 Iron deficiency anemia, unspecified: Secondary | ICD-10-CM | POA: Diagnosis not present

## 2017-06-11 DIAGNOSIS — N2581 Secondary hyperparathyroidism of renal origin: Secondary | ICD-10-CM | POA: Diagnosis not present

## 2017-06-11 DIAGNOSIS — D631 Anemia in chronic kidney disease: Secondary | ICD-10-CM | POA: Diagnosis not present

## 2017-06-12 DIAGNOSIS — Z6832 Body mass index (BMI) 32.0-32.9, adult: Secondary | ICD-10-CM | POA: Diagnosis not present

## 2017-06-12 DIAGNOSIS — G8929 Other chronic pain: Secondary | ICD-10-CM | POA: Diagnosis not present

## 2017-06-12 DIAGNOSIS — M545 Low back pain: Secondary | ICD-10-CM | POA: Diagnosis not present

## 2017-06-12 DIAGNOSIS — I10 Essential (primary) hypertension: Secondary | ICD-10-CM | POA: Diagnosis not present

## 2017-06-13 DIAGNOSIS — D631 Anemia in chronic kidney disease: Secondary | ICD-10-CM | POA: Diagnosis not present

## 2017-06-13 DIAGNOSIS — D509 Iron deficiency anemia, unspecified: Secondary | ICD-10-CM | POA: Diagnosis not present

## 2017-06-13 DIAGNOSIS — E8779 Other fluid overload: Secondary | ICD-10-CM | POA: Diagnosis not present

## 2017-06-13 DIAGNOSIS — N186 End stage renal disease: Secondary | ICD-10-CM | POA: Diagnosis not present

## 2017-06-13 DIAGNOSIS — N2581 Secondary hyperparathyroidism of renal origin: Secondary | ICD-10-CM | POA: Diagnosis not present

## 2017-06-14 DIAGNOSIS — E8779 Other fluid overload: Secondary | ICD-10-CM | POA: Diagnosis not present

## 2017-06-14 DIAGNOSIS — D631 Anemia in chronic kidney disease: Secondary | ICD-10-CM | POA: Diagnosis not present

## 2017-06-14 DIAGNOSIS — N186 End stage renal disease: Secondary | ICD-10-CM | POA: Diagnosis not present

## 2017-06-14 DIAGNOSIS — N2581 Secondary hyperparathyroidism of renal origin: Secondary | ICD-10-CM | POA: Diagnosis not present

## 2017-06-14 DIAGNOSIS — D509 Iron deficiency anemia, unspecified: Secondary | ICD-10-CM | POA: Diagnosis not present

## 2017-06-15 DIAGNOSIS — N186 End stage renal disease: Secondary | ICD-10-CM | POA: Diagnosis not present

## 2017-06-15 DIAGNOSIS — E8779 Other fluid overload: Secondary | ICD-10-CM | POA: Diagnosis not present

## 2017-06-15 DIAGNOSIS — D631 Anemia in chronic kidney disease: Secondary | ICD-10-CM | POA: Diagnosis not present

## 2017-06-15 DIAGNOSIS — D509 Iron deficiency anemia, unspecified: Secondary | ICD-10-CM | POA: Diagnosis not present

## 2017-06-15 DIAGNOSIS — N2581 Secondary hyperparathyroidism of renal origin: Secondary | ICD-10-CM | POA: Diagnosis not present

## 2017-06-18 DIAGNOSIS — N186 End stage renal disease: Secondary | ICD-10-CM | POA: Diagnosis not present

## 2017-06-18 DIAGNOSIS — N2581 Secondary hyperparathyroidism of renal origin: Secondary | ICD-10-CM | POA: Diagnosis not present

## 2017-06-18 DIAGNOSIS — D509 Iron deficiency anemia, unspecified: Secondary | ICD-10-CM | POA: Diagnosis not present

## 2017-06-18 DIAGNOSIS — E8779 Other fluid overload: Secondary | ICD-10-CM | POA: Diagnosis not present

## 2017-06-18 DIAGNOSIS — D631 Anemia in chronic kidney disease: Secondary | ICD-10-CM | POA: Diagnosis not present

## 2017-06-20 DIAGNOSIS — N2581 Secondary hyperparathyroidism of renal origin: Secondary | ICD-10-CM | POA: Diagnosis not present

## 2017-06-20 DIAGNOSIS — D631 Anemia in chronic kidney disease: Secondary | ICD-10-CM | POA: Diagnosis not present

## 2017-06-20 DIAGNOSIS — N186 End stage renal disease: Secondary | ICD-10-CM | POA: Diagnosis not present

## 2017-06-20 DIAGNOSIS — E8779 Other fluid overload: Secondary | ICD-10-CM | POA: Diagnosis not present

## 2017-06-20 DIAGNOSIS — D509 Iron deficiency anemia, unspecified: Secondary | ICD-10-CM | POA: Diagnosis not present

## 2017-06-22 DIAGNOSIS — N2581 Secondary hyperparathyroidism of renal origin: Secondary | ICD-10-CM | POA: Diagnosis not present

## 2017-06-22 DIAGNOSIS — N186 End stage renal disease: Secondary | ICD-10-CM | POA: Diagnosis not present

## 2017-06-22 DIAGNOSIS — E8779 Other fluid overload: Secondary | ICD-10-CM | POA: Diagnosis not present

## 2017-06-22 DIAGNOSIS — D509 Iron deficiency anemia, unspecified: Secondary | ICD-10-CM | POA: Diagnosis not present

## 2017-06-22 DIAGNOSIS — D631 Anemia in chronic kidney disease: Secondary | ICD-10-CM | POA: Diagnosis not present

## 2017-06-25 DIAGNOSIS — D509 Iron deficiency anemia, unspecified: Secondary | ICD-10-CM | POA: Diagnosis not present

## 2017-06-25 DIAGNOSIS — D631 Anemia in chronic kidney disease: Secondary | ICD-10-CM | POA: Diagnosis not present

## 2017-06-25 DIAGNOSIS — E8779 Other fluid overload: Secondary | ICD-10-CM | POA: Diagnosis not present

## 2017-06-25 DIAGNOSIS — N2581 Secondary hyperparathyroidism of renal origin: Secondary | ICD-10-CM | POA: Diagnosis not present

## 2017-06-25 DIAGNOSIS — N186 End stage renal disease: Secondary | ICD-10-CM | POA: Diagnosis not present

## 2017-06-27 DIAGNOSIS — Z992 Dependence on renal dialysis: Secondary | ICD-10-CM | POA: Diagnosis not present

## 2017-06-27 DIAGNOSIS — I120 Hypertensive chronic kidney disease with stage 5 chronic kidney disease or end stage renal disease: Secondary | ICD-10-CM | POA: Diagnosis not present

## 2017-06-27 DIAGNOSIS — N186 End stage renal disease: Secondary | ICD-10-CM | POA: Diagnosis not present

## 2017-06-27 DIAGNOSIS — E8779 Other fluid overload: Secondary | ICD-10-CM | POA: Diagnosis not present

## 2017-06-27 DIAGNOSIS — D631 Anemia in chronic kidney disease: Secondary | ICD-10-CM | POA: Diagnosis not present

## 2017-06-27 DIAGNOSIS — D509 Iron deficiency anemia, unspecified: Secondary | ICD-10-CM | POA: Diagnosis not present

## 2017-06-27 DIAGNOSIS — N2581 Secondary hyperparathyroidism of renal origin: Secondary | ICD-10-CM | POA: Diagnosis not present

## 2017-06-29 DIAGNOSIS — N2581 Secondary hyperparathyroidism of renal origin: Secondary | ICD-10-CM | POA: Diagnosis not present

## 2017-06-29 DIAGNOSIS — N186 End stage renal disease: Secondary | ICD-10-CM | POA: Diagnosis not present

## 2017-06-29 DIAGNOSIS — D631 Anemia in chronic kidney disease: Secondary | ICD-10-CM | POA: Diagnosis not present

## 2017-07-02 DIAGNOSIS — N2581 Secondary hyperparathyroidism of renal origin: Secondary | ICD-10-CM | POA: Diagnosis not present

## 2017-07-02 DIAGNOSIS — N186 End stage renal disease: Secondary | ICD-10-CM | POA: Diagnosis not present

## 2017-07-02 DIAGNOSIS — D631 Anemia in chronic kidney disease: Secondary | ICD-10-CM | POA: Diagnosis not present

## 2017-07-03 DIAGNOSIS — D225 Melanocytic nevi of trunk: Secondary | ICD-10-CM | POA: Diagnosis not present

## 2017-07-03 DIAGNOSIS — L853 Xerosis cutis: Secondary | ICD-10-CM | POA: Diagnosis not present

## 2017-07-03 DIAGNOSIS — L814 Other melanin hyperpigmentation: Secondary | ICD-10-CM | POA: Diagnosis not present

## 2017-07-03 DIAGNOSIS — L918 Other hypertrophic disorders of the skin: Secondary | ICD-10-CM | POA: Diagnosis not present

## 2017-07-03 DIAGNOSIS — R208 Other disturbances of skin sensation: Secondary | ICD-10-CM | POA: Diagnosis not present

## 2017-07-03 DIAGNOSIS — D485 Neoplasm of uncertain behavior of skin: Secondary | ICD-10-CM | POA: Diagnosis not present

## 2017-07-04 DIAGNOSIS — D631 Anemia in chronic kidney disease: Secondary | ICD-10-CM | POA: Diagnosis not present

## 2017-07-04 DIAGNOSIS — N186 End stage renal disease: Secondary | ICD-10-CM | POA: Diagnosis not present

## 2017-07-04 DIAGNOSIS — N2581 Secondary hyperparathyroidism of renal origin: Secondary | ICD-10-CM | POA: Diagnosis not present

## 2017-07-06 DIAGNOSIS — N186 End stage renal disease: Secondary | ICD-10-CM | POA: Diagnosis not present

## 2017-07-06 DIAGNOSIS — N2581 Secondary hyperparathyroidism of renal origin: Secondary | ICD-10-CM | POA: Diagnosis not present

## 2017-07-06 DIAGNOSIS — D631 Anemia in chronic kidney disease: Secondary | ICD-10-CM | POA: Diagnosis not present

## 2017-07-09 DIAGNOSIS — N186 End stage renal disease: Secondary | ICD-10-CM | POA: Diagnosis not present

## 2017-07-09 DIAGNOSIS — N2581 Secondary hyperparathyroidism of renal origin: Secondary | ICD-10-CM | POA: Diagnosis not present

## 2017-07-09 DIAGNOSIS — D631 Anemia in chronic kidney disease: Secondary | ICD-10-CM | POA: Diagnosis not present

## 2017-07-11 DIAGNOSIS — N2581 Secondary hyperparathyroidism of renal origin: Secondary | ICD-10-CM | POA: Diagnosis not present

## 2017-07-11 DIAGNOSIS — D631 Anemia in chronic kidney disease: Secondary | ICD-10-CM | POA: Diagnosis not present

## 2017-07-11 DIAGNOSIS — N186 End stage renal disease: Secondary | ICD-10-CM | POA: Diagnosis not present

## 2017-07-13 DIAGNOSIS — N2581 Secondary hyperparathyroidism of renal origin: Secondary | ICD-10-CM | POA: Diagnosis not present

## 2017-07-13 DIAGNOSIS — N186 End stage renal disease: Secondary | ICD-10-CM | POA: Diagnosis not present

## 2017-07-13 DIAGNOSIS — D631 Anemia in chronic kidney disease: Secondary | ICD-10-CM | POA: Diagnosis not present

## 2017-07-15 DIAGNOSIS — D631 Anemia in chronic kidney disease: Secondary | ICD-10-CM | POA: Diagnosis not present

## 2017-07-15 DIAGNOSIS — N186 End stage renal disease: Secondary | ICD-10-CM | POA: Diagnosis not present

## 2017-07-15 DIAGNOSIS — N2581 Secondary hyperparathyroidism of renal origin: Secondary | ICD-10-CM | POA: Diagnosis not present

## 2017-07-17 DIAGNOSIS — D631 Anemia in chronic kidney disease: Secondary | ICD-10-CM | POA: Diagnosis not present

## 2017-07-17 DIAGNOSIS — N186 End stage renal disease: Secondary | ICD-10-CM | POA: Diagnosis not present

## 2017-07-17 DIAGNOSIS — N2581 Secondary hyperparathyroidism of renal origin: Secondary | ICD-10-CM | POA: Diagnosis not present

## 2017-07-20 DIAGNOSIS — N2581 Secondary hyperparathyroidism of renal origin: Secondary | ICD-10-CM | POA: Diagnosis not present

## 2017-07-20 DIAGNOSIS — N186 End stage renal disease: Secondary | ICD-10-CM | POA: Diagnosis not present

## 2017-07-20 DIAGNOSIS — D631 Anemia in chronic kidney disease: Secondary | ICD-10-CM | POA: Diagnosis not present

## 2017-07-23 DIAGNOSIS — D631 Anemia in chronic kidney disease: Secondary | ICD-10-CM | POA: Diagnosis not present

## 2017-07-23 DIAGNOSIS — N186 End stage renal disease: Secondary | ICD-10-CM | POA: Diagnosis not present

## 2017-07-23 DIAGNOSIS — N2581 Secondary hyperparathyroidism of renal origin: Secondary | ICD-10-CM | POA: Diagnosis not present

## 2017-07-24 DIAGNOSIS — M545 Low back pain: Secondary | ICD-10-CM | POA: Diagnosis not present

## 2017-07-25 DIAGNOSIS — N186 End stage renal disease: Secondary | ICD-10-CM | POA: Diagnosis not present

## 2017-07-25 DIAGNOSIS — N2581 Secondary hyperparathyroidism of renal origin: Secondary | ICD-10-CM | POA: Diagnosis not present

## 2017-07-25 DIAGNOSIS — D631 Anemia in chronic kidney disease: Secondary | ICD-10-CM | POA: Diagnosis not present

## 2017-07-27 DIAGNOSIS — D631 Anemia in chronic kidney disease: Secondary | ICD-10-CM | POA: Diagnosis not present

## 2017-07-27 DIAGNOSIS — N186 End stage renal disease: Secondary | ICD-10-CM | POA: Diagnosis not present

## 2017-07-27 DIAGNOSIS — N2581 Secondary hyperparathyroidism of renal origin: Secondary | ICD-10-CM | POA: Diagnosis not present

## 2017-07-27 DIAGNOSIS — I120 Hypertensive chronic kidney disease with stage 5 chronic kidney disease or end stage renal disease: Secondary | ICD-10-CM | POA: Diagnosis not present

## 2017-07-27 DIAGNOSIS — Z992 Dependence on renal dialysis: Secondary | ICD-10-CM | POA: Diagnosis not present

## 2017-07-30 DIAGNOSIS — N186 End stage renal disease: Secondary | ICD-10-CM | POA: Diagnosis not present

## 2017-07-30 DIAGNOSIS — N2581 Secondary hyperparathyroidism of renal origin: Secondary | ICD-10-CM | POA: Diagnosis not present

## 2017-08-01 DIAGNOSIS — N186 End stage renal disease: Secondary | ICD-10-CM | POA: Diagnosis not present

## 2017-08-01 DIAGNOSIS — N2581 Secondary hyperparathyroidism of renal origin: Secondary | ICD-10-CM | POA: Diagnosis not present

## 2017-08-03 DIAGNOSIS — N186 End stage renal disease: Secondary | ICD-10-CM | POA: Diagnosis not present

## 2017-08-03 DIAGNOSIS — N2581 Secondary hyperparathyroidism of renal origin: Secondary | ICD-10-CM | POA: Diagnosis not present

## 2017-08-06 DIAGNOSIS — N2581 Secondary hyperparathyroidism of renal origin: Secondary | ICD-10-CM | POA: Diagnosis not present

## 2017-08-06 DIAGNOSIS — N186 End stage renal disease: Secondary | ICD-10-CM | POA: Diagnosis not present

## 2017-08-08 DIAGNOSIS — N186 End stage renal disease: Secondary | ICD-10-CM | POA: Diagnosis not present

## 2017-08-08 DIAGNOSIS — N2581 Secondary hyperparathyroidism of renal origin: Secondary | ICD-10-CM | POA: Diagnosis not present

## 2017-08-10 DIAGNOSIS — N2581 Secondary hyperparathyroidism of renal origin: Secondary | ICD-10-CM | POA: Diagnosis not present

## 2017-08-10 DIAGNOSIS — N186 End stage renal disease: Secondary | ICD-10-CM | POA: Diagnosis not present

## 2017-08-13 DIAGNOSIS — N2581 Secondary hyperparathyroidism of renal origin: Secondary | ICD-10-CM | POA: Diagnosis not present

## 2017-08-13 DIAGNOSIS — N186 End stage renal disease: Secondary | ICD-10-CM | POA: Diagnosis not present

## 2017-08-15 DIAGNOSIS — N2581 Secondary hyperparathyroidism of renal origin: Secondary | ICD-10-CM | POA: Diagnosis not present

## 2017-08-15 DIAGNOSIS — N186 End stage renal disease: Secondary | ICD-10-CM | POA: Diagnosis not present

## 2017-08-16 DIAGNOSIS — Z6832 Body mass index (BMI) 32.0-32.9, adult: Secondary | ICD-10-CM | POA: Diagnosis not present

## 2017-08-16 DIAGNOSIS — M545 Low back pain: Secondary | ICD-10-CM | POA: Diagnosis not present

## 2017-08-16 DIAGNOSIS — G8929 Other chronic pain: Secondary | ICD-10-CM | POA: Diagnosis not present

## 2017-08-16 DIAGNOSIS — I10 Essential (primary) hypertension: Secondary | ICD-10-CM | POA: Diagnosis not present

## 2017-08-17 DIAGNOSIS — N2581 Secondary hyperparathyroidism of renal origin: Secondary | ICD-10-CM | POA: Diagnosis not present

## 2017-08-17 DIAGNOSIS — N186 End stage renal disease: Secondary | ICD-10-CM | POA: Diagnosis not present

## 2017-08-19 DIAGNOSIS — N186 End stage renal disease: Secondary | ICD-10-CM | POA: Diagnosis not present

## 2017-08-19 DIAGNOSIS — N2581 Secondary hyperparathyroidism of renal origin: Secondary | ICD-10-CM | POA: Diagnosis not present

## 2017-08-22 DIAGNOSIS — N186 End stage renal disease: Secondary | ICD-10-CM | POA: Diagnosis not present

## 2017-08-22 DIAGNOSIS — N2581 Secondary hyperparathyroidism of renal origin: Secondary | ICD-10-CM | POA: Diagnosis not present

## 2017-08-24 DIAGNOSIS — N186 End stage renal disease: Secondary | ICD-10-CM | POA: Diagnosis not present

## 2017-08-24 DIAGNOSIS — N2581 Secondary hyperparathyroidism of renal origin: Secondary | ICD-10-CM | POA: Diagnosis not present

## 2017-08-26 DIAGNOSIS — N2581 Secondary hyperparathyroidism of renal origin: Secondary | ICD-10-CM | POA: Diagnosis not present

## 2017-08-26 DIAGNOSIS — N186 End stage renal disease: Secondary | ICD-10-CM | POA: Diagnosis not present

## 2017-08-27 DIAGNOSIS — Z992 Dependence on renal dialysis: Secondary | ICD-10-CM | POA: Diagnosis not present

## 2017-08-27 DIAGNOSIS — N186 End stage renal disease: Secondary | ICD-10-CM | POA: Diagnosis not present

## 2017-08-27 DIAGNOSIS — I120 Hypertensive chronic kidney disease with stage 5 chronic kidney disease or end stage renal disease: Secondary | ICD-10-CM | POA: Diagnosis not present

## 2017-08-29 DIAGNOSIS — N2581 Secondary hyperparathyroidism of renal origin: Secondary | ICD-10-CM | POA: Diagnosis not present

## 2017-08-29 DIAGNOSIS — N186 End stage renal disease: Secondary | ICD-10-CM | POA: Diagnosis not present

## 2017-08-31 DIAGNOSIS — N186 End stage renal disease: Secondary | ICD-10-CM | POA: Diagnosis not present

## 2017-08-31 DIAGNOSIS — N2581 Secondary hyperparathyroidism of renal origin: Secondary | ICD-10-CM | POA: Diagnosis not present

## 2017-09-03 DIAGNOSIS — N186 End stage renal disease: Secondary | ICD-10-CM | POA: Diagnosis not present

## 2017-09-03 DIAGNOSIS — N2581 Secondary hyperparathyroidism of renal origin: Secondary | ICD-10-CM | POA: Diagnosis not present

## 2017-09-05 DIAGNOSIS — N186 End stage renal disease: Secondary | ICD-10-CM | POA: Diagnosis not present

## 2017-09-05 DIAGNOSIS — N2581 Secondary hyperparathyroidism of renal origin: Secondary | ICD-10-CM | POA: Diagnosis not present

## 2017-09-07 DIAGNOSIS — N2581 Secondary hyperparathyroidism of renal origin: Secondary | ICD-10-CM | POA: Diagnosis not present

## 2017-09-07 DIAGNOSIS — N186 End stage renal disease: Secondary | ICD-10-CM | POA: Diagnosis not present

## 2017-09-10 DIAGNOSIS — N2581 Secondary hyperparathyroidism of renal origin: Secondary | ICD-10-CM | POA: Diagnosis not present

## 2017-09-10 DIAGNOSIS — N186 End stage renal disease: Secondary | ICD-10-CM | POA: Diagnosis not present

## 2017-09-12 DIAGNOSIS — N186 End stage renal disease: Secondary | ICD-10-CM | POA: Diagnosis not present

## 2017-09-12 DIAGNOSIS — N2581 Secondary hyperparathyroidism of renal origin: Secondary | ICD-10-CM | POA: Diagnosis not present

## 2017-09-13 DIAGNOSIS — G8929 Other chronic pain: Secondary | ICD-10-CM | POA: Diagnosis not present

## 2017-09-13 DIAGNOSIS — M545 Low back pain: Secondary | ICD-10-CM | POA: Diagnosis not present

## 2017-09-13 DIAGNOSIS — I10 Essential (primary) hypertension: Secondary | ICD-10-CM | POA: Diagnosis not present

## 2017-09-13 DIAGNOSIS — Z6832 Body mass index (BMI) 32.0-32.9, adult: Secondary | ICD-10-CM | POA: Diagnosis not present

## 2017-09-14 DIAGNOSIS — N186 End stage renal disease: Secondary | ICD-10-CM | POA: Diagnosis not present

## 2017-09-14 DIAGNOSIS — N2581 Secondary hyperparathyroidism of renal origin: Secondary | ICD-10-CM | POA: Diagnosis not present

## 2017-09-17 DIAGNOSIS — N2581 Secondary hyperparathyroidism of renal origin: Secondary | ICD-10-CM | POA: Diagnosis not present

## 2017-09-17 DIAGNOSIS — N186 End stage renal disease: Secondary | ICD-10-CM | POA: Diagnosis not present

## 2017-09-19 DIAGNOSIS — N2581 Secondary hyperparathyroidism of renal origin: Secondary | ICD-10-CM | POA: Diagnosis not present

## 2017-09-19 DIAGNOSIS — N186 End stage renal disease: Secondary | ICD-10-CM | POA: Diagnosis not present

## 2017-09-20 ENCOUNTER — Other Ambulatory Visit: Payer: Self-pay

## 2017-09-20 ENCOUNTER — Ambulatory Visit (INDEPENDENT_AMBULATORY_CARE_PROVIDER_SITE_OTHER): Payer: Medicare Other | Admitting: Neurology

## 2017-09-20 ENCOUNTER — Encounter: Payer: Self-pay | Admitting: Neurology

## 2017-09-20 DIAGNOSIS — M792 Neuralgia and neuritis, unspecified: Secondary | ICD-10-CM

## 2017-09-20 HISTORY — DX: Neuralgia and neuritis, unspecified: M79.2

## 2017-09-20 MED ORDER — GABAPENTIN 100 MG PO CAPS: 300.0000 mg | ORAL_CAPSULE | Freq: Every day | ORAL | 0 refills | Status: DC

## 2017-09-20 MED ORDER — LIDOCAINE 5 % EX OINT: 1.0000 "application " | TOPICAL_OINTMENT | Freq: Three times a day (TID) | CUTANEOUS | 5 refills | Status: DC | PRN

## 2017-09-20 NOTE — Patient Instructions (Signed)
   We will try lidocaine ointment for the area of pain.

## 2017-09-20 NOTE — Progress Notes (Addendum)
Reason for visit: Back pain  Referring physician: Dr. Trena Platt is a 52 y.o. male  History of present illness:  Marco Arnold is a 52 year old right-handed white male with a history of end-stage renal disease.  The patient indicates that he has had some low back pain for most of his life, he has seen chiropractors in the past.  One year ago, he began having a new type of pain involving the paraspinal area on the left in the mid back.  The patient notes a hypersensitivity of the skin with sharp and lancinating pains.  He finds that he cannot sleep in bed unless he wears a shirt to prevent contact with the sheets.  The patient will have pain when he leans up against the back of a chair.  He feels better when he is up walking and nothing is touching his back.  The patient denies any radiation of pain around the chest wall, he denies that he has had any problems with shingles in the past.  The patient denies pain down the legs.  He denies any significant neck pain or pain down the arms.  He does have some intermittent numbness of the left hand at nighttime.  He may have some slight numbness of the feet, he denies any burning or stinging sensations of the feet.  Denies any balance issues.  He does not produce urine, but he claims that the bowels are functioning normally, he has no difficulty with controlling the bowels.  He does have some chronic left hip pain at times.  He has used a Lidoderm patch on the area of discomfort but he had skin irritation with this, he has a tape adhesive allergy.  The patient is sent to this office for an evaluation.  MRI studies of the lumbar spine and thoracic spine have been done.  The patient does have evidence of a T6-7 level disc protrusion, no severe spinal cord compression is seen at this level.  There is also evidence of bone spurring to the left at the T7-8 level.  Past Medical History:  Diagnosis Date  . Chronic kidney disease    not started  dialysis  yet  . Hypertension    dr  Tobie Lords      @ [facility]  med  . Neuropathic pain 09/20/2017   Left mid-thoracic    Past Surgical History:  Procedure Laterality Date  . AV FISTULA PLACEMENT  11/10/2011   Procedure: ARTERIOVENOUS (AV) FISTULA CREATION;  Surgeon: Mal Misty, MD;  Location: [place];  Service: Vascular;  Laterality: Left;  Ultrasound guided  . INSERTION OF DIALYSIS CATHETER  02/09/2012   Procedure: INSERTION OF DIALYSIS CATHETER;  Surgeon: Mal Misty, MD;  Location: [place];  Service: Vascular;  Laterality: N/A;  . SHUNTOGRAM Left 06/03/2012   Procedure: Earney Mallet;  Surgeon: Conrad [name], MD;  Location: [facility] CATH LAB;  Service: Cardiovascular;  Laterality: Left;  . SHUNTOGRAM Left 01/02/2013   Procedure: SHUNTOGRAM;  Surgeon: Conrad Allenhurst, MD;  Location: [facility] CATH LAB;  Service: Cardiovascular;  Laterality: Left;    Family History  Problem Relation Age of Onset  . Cancer Mother   . Diabetes Father   . Hypertension Father   . Cancer - Lung Paternal Grandfather     Social history:  reports that he has been smoking cigarettes.  He has a 35.00 pack-year smoking history. he has never used smokeless tobacco. He reports that he drinks alcohol. He reports  that he uses drugs. Drug: Marijuana.  Medications:  Prior to Admission medications   Medication Sig Start Date End Date Taking? Authorizing Provider  amLODipine (NORVASC) 5 MG tablet Take 5 mg by mouth daily.  10/26/12  Yes [provider]  cabergoline (DOSTINEX) 0.5 MG tablet Take 2 mg by mouth See admin instructions. Twice weekly 07/13/16  Yes [provider]  cloNIDine (CATAPRES) 0.1 MG tablet Take 0.2 mg by mouth daily. 07/13/16  Yes [provider]  cyclobenzaprine (FLEXERIL) 10 MG tablet Take 10 mg by mouth 2 (two) times daily as needed for muscle spasms.    Yes [provider]  FOSRENOL 1000 MG chewable tablet Chew 1,000 mg by mouth daily. 08/31/17  Yes [provider]    furosemide (LASIX) 80 MG tablet Take 1 tablet by mouth as needed. With increased fluid 11/15/12  Yes [provider]  gabapentin (NEURONTIN) 100 MG capsule Take 3 capsules (300 mg total) by mouth at bedtime. 09/20/17  Yes Kathrynn Ducking, MD  losartan (COZAAR) 100 MG tablet Take 100 mg by mouth daily.   Yes [provider]  Multiple Vitamins-Minerals (MULTIVITAMINS THER. W/MINERALS) TABS Take 1 tablet by mouth daily.   Yes [provider]  pantoprazole (PROTONIX) 40 MG tablet Take 40 mg by mouth as needed (for stomach).  12/04/12  Yes [provider]  sildenafil (REVATIO) 20 MG tablet Take 60 mg by mouth daily as needed.   Yes [provider]  VELPHORO 500 MG chewable tablet Chew 2,000 mg by mouth 3 (three) times daily. 07/13/16  Yes [provider]  lidocaine (XYLOCAINE) 5 % ointment Apply 1 application topically 3 (three) times daily as needed. 09/20/17   Kathrynn Ducking, MD      Allergies  Allergen Reactions  . Lisinopril Other (See Comments)    Unable to breath    ROS:  Out of a complete 14 system review of symptoms, the patient complains only of the following symptoms, and all other reviewed systems are negative.  Joint pain Moles  Blood pressure (!) 149/79, pulse 89, height 6' 3.5" (1.918 m), weight 258 lb 8 oz (117.3 kg).  Physical Exam  General: The patient is alert and cooperative at the time of the examination.  Patient is moderately obese.  Eyes: Pupils are equal, round, and reactive to light. Discs are flat bilaterally.  Neck: The neck is supple, no carotid bruits are noted.  Respiratory: The respiratory examination is clear with the exception of wheezes in the right anterior lung fields.  Cardiovascular: The cardiovascular examination reveals a regular rate and rhythm, no obvious murmurs or rubs are noted.  Skin: Extremities are without significant edema.  Neurologic Exam  Mental status: The patient is alert  and oriented x 3 at the time of the examination. The patient has apparent normal recent and remote memory, with an apparently normal attention span and concentration ability.  Cranial nerves: Facial symmetry is present. There is good sensation of the face to pinprick and soft touch bilaterally. The strength of the facial muscles and the muscles to head turning and shoulder shrug are normal bilaterally. Speech is well enunciated, no aphasia or dysarthria is noted. Extraocular movements are full. Visual fields are full. The tongue is midline, and the patient has symmetric elevation of the soft palate. No obvious hearing deficits are noted.  Motor: The motor testing reveals 5 over 5 strength of all 4 extremities. Good symmetric motor tone is noted throughout.  Sensory: Sensory  testing is intact to pinprick, soft touch, vibration sensation, and position sense on all 4 extremities with exception of a stocking pattern pinprick sensory deficit 1 [address] up the legs below the knees. No evidence of extinction is noted.  Coordination: Cerebellar testing reveals good finger-nose-finger and heel-to-shin bilaterally.  Gait and station: Gait is normal. Tandem gait is minimally unsteady. Romberg is negative. No drift is seen.  Reflexes: Deep tendon reflexes are symmetric, but are depressed bilaterally. Toes are downgoing bilaterally.   MRI thoracic 03/27/17:  IMPRESSION: Examination is limited due to lack of accurate cross referencing of axial images. The patient had difficulty holding still for the study  Multilevel disc degeneration. Moderately large central disc protrusion at T6-7, with smaller disc protrusions and spurring at multiple levels. Mild spinal stenosis T9-10 and T10-11.  * MRI scan images were reviewed online. I agree with the written report.   MRI lumbar 03/27/17:  IMPRESSION: Negative for fracture or spinal infection  Multilevel disc and facet degeneration. Mild spinal stenosis  L3-4 and L4-5 with progression since the MRI of 09/21/2015.    Assessment/Plan:  1.  Probable radicular pain, left midthoracic level  The patient appears to have hypersensitivity in the distribution of a dorsal ramus of a nerve root in the left mid back.  This may correlate with the disc protrusion or bone spurring at the T6-7 or T7-8 levels.  The area of discomfort is quite small, associated with significant sensitivity to light touch.  We will initiate treatment with topical therapy with lidocaine ointment.  The patient cannot take the Lidoderm patch secondary to allergies to the adhesive.  If this is not effective for his pain, he will call our office and we will initiate treatment with carbamazepine.  Otherwise, the patient will follow-up in 5 months.  Jill Alexanders MD 09/20/2017 8:45 AM  [facility] [address] [place], [place] [address]  Phone [phone] Fax [phone]

## 2017-09-21 ENCOUNTER — Telehealth: Payer: Self-pay | Admitting: *Deleted

## 2017-09-21 DIAGNOSIS — N2581 Secondary hyperparathyroidism of renal origin: Secondary | ICD-10-CM | POA: Diagnosis not present

## 2017-09-21 DIAGNOSIS — N186 End stage renal disease: Secondary | ICD-10-CM | POA: Diagnosis not present

## 2017-09-21 NOTE — Telephone Encounter (Signed)
Faxed completed/signed [place] Lidocaine ointment 5% to [employer] at [phone]. Waiting on determination.

## 2017-09-21 NOTE — Telephone Encounter (Signed)
Received fax notification from [place] that [employer] [place] denied. It is being used for an off label use: neuropathic pain.  Gave to Dr. Jannifer Franklin to review.

## 2017-09-24 ENCOUNTER — Other Ambulatory Visit: Payer: Self-pay | Admitting: Neurology

## 2017-09-24 DIAGNOSIS — N186 End stage renal disease: Secondary | ICD-10-CM | POA: Diagnosis not present

## 2017-09-24 DIAGNOSIS — N2581 Secondary hyperparathyroidism of renal origin: Secondary | ICD-10-CM | POA: Diagnosis not present

## 2017-09-24 MED ORDER — LIDOCAINE 2 % EX GEL: 1.0000 g | Freq: Three times a day (TID) | CUTANEOUS | 5 refills | Status: DC | PRN

## 2017-09-24 NOTE — Telephone Encounter (Signed)
Faxed printed/signed rx lidocaine 2% gel to [facility] at [phone] to see if this will be covered instead. Received fax confirmation.

## 2017-09-26 DIAGNOSIS — N186 End stage renal disease: Secondary | ICD-10-CM | POA: Diagnosis not present

## 2017-09-26 DIAGNOSIS — N2581 Secondary hyperparathyroidism of renal origin: Secondary | ICD-10-CM | POA: Diagnosis not present

## 2017-09-27 DIAGNOSIS — I120 Hypertensive chronic kidney disease with stage 5 chronic kidney disease or end stage renal disease: Secondary | ICD-10-CM | POA: Diagnosis not present

## 2017-09-27 DIAGNOSIS — N186 End stage renal disease: Secondary | ICD-10-CM | POA: Diagnosis not present

## 2017-09-27 DIAGNOSIS — Z992 Dependence on renal dialysis: Secondary | ICD-10-CM | POA: Diagnosis not present

## 2017-09-28 DIAGNOSIS — Z992 Dependence on renal dialysis: Secondary | ICD-10-CM | POA: Diagnosis not present

## 2017-09-28 DIAGNOSIS — I120 Hypertensive chronic kidney disease with stage 5 chronic kidney disease or end stage renal disease: Secondary | ICD-10-CM | POA: Diagnosis not present

## 2017-09-28 DIAGNOSIS — N186 End stage renal disease: Secondary | ICD-10-CM | POA: Diagnosis not present

## 2017-09-28 DIAGNOSIS — N2581 Secondary hyperparathyroidism of renal origin: Secondary | ICD-10-CM | POA: Diagnosis not present

## 2017-09-28 DIAGNOSIS — D509 Iron deficiency anemia, unspecified: Secondary | ICD-10-CM | POA: Diagnosis not present

## 2017-09-28 DIAGNOSIS — D631 Anemia in chronic kidney disease: Secondary | ICD-10-CM | POA: Diagnosis not present

## 2017-10-01 DIAGNOSIS — N2581 Secondary hyperparathyroidism of renal origin: Secondary | ICD-10-CM | POA: Diagnosis not present

## 2017-10-01 DIAGNOSIS — D631 Anemia in chronic kidney disease: Secondary | ICD-10-CM | POA: Diagnosis not present

## 2017-10-01 DIAGNOSIS — N186 End stage renal disease: Secondary | ICD-10-CM | POA: Diagnosis not present

## 2017-10-01 DIAGNOSIS — Z992 Dependence on renal dialysis: Secondary | ICD-10-CM | POA: Diagnosis not present

## 2017-10-01 DIAGNOSIS — D509 Iron deficiency anemia, unspecified: Secondary | ICD-10-CM | POA: Diagnosis not present

## 2017-10-03 DIAGNOSIS — Z992 Dependence on renal dialysis: Secondary | ICD-10-CM | POA: Diagnosis not present

## 2017-10-03 DIAGNOSIS — D509 Iron deficiency anemia, unspecified: Secondary | ICD-10-CM | POA: Diagnosis not present

## 2017-10-03 DIAGNOSIS — D631 Anemia in chronic kidney disease: Secondary | ICD-10-CM | POA: Diagnosis not present

## 2017-10-03 DIAGNOSIS — N2581 Secondary hyperparathyroidism of renal origin: Secondary | ICD-10-CM | POA: Diagnosis not present

## 2017-10-03 DIAGNOSIS — N186 End stage renal disease: Secondary | ICD-10-CM | POA: Diagnosis not present

## 2017-10-05 DIAGNOSIS — D509 Iron deficiency anemia, unspecified: Secondary | ICD-10-CM | POA: Diagnosis not present

## 2017-10-05 DIAGNOSIS — N2581 Secondary hyperparathyroidism of renal origin: Secondary | ICD-10-CM | POA: Diagnosis not present

## 2017-10-05 DIAGNOSIS — D631 Anemia in chronic kidney disease: Secondary | ICD-10-CM | POA: Diagnosis not present

## 2017-10-05 DIAGNOSIS — Z992 Dependence on renal dialysis: Secondary | ICD-10-CM | POA: Diagnosis not present

## 2017-10-05 DIAGNOSIS — N186 End stage renal disease: Secondary | ICD-10-CM | POA: Diagnosis not present

## 2017-10-08 DIAGNOSIS — D509 Iron deficiency anemia, unspecified: Secondary | ICD-10-CM | POA: Diagnosis not present

## 2017-10-08 DIAGNOSIS — Z992 Dependence on renal dialysis: Secondary | ICD-10-CM | POA: Diagnosis not present

## 2017-10-08 DIAGNOSIS — N2581 Secondary hyperparathyroidism of renal origin: Secondary | ICD-10-CM | POA: Diagnosis not present

## 2017-10-08 DIAGNOSIS — N186 End stage renal disease: Secondary | ICD-10-CM | POA: Diagnosis not present

## 2017-10-08 DIAGNOSIS — D631 Anemia in chronic kidney disease: Secondary | ICD-10-CM | POA: Diagnosis not present

## 2017-10-10 DIAGNOSIS — N186 End stage renal disease: Secondary | ICD-10-CM | POA: Diagnosis not present

## 2017-10-10 DIAGNOSIS — D631 Anemia in chronic kidney disease: Secondary | ICD-10-CM | POA: Diagnosis not present

## 2017-10-10 DIAGNOSIS — Z992 Dependence on renal dialysis: Secondary | ICD-10-CM | POA: Diagnosis not present

## 2017-10-10 DIAGNOSIS — D509 Iron deficiency anemia, unspecified: Secondary | ICD-10-CM | POA: Diagnosis not present

## 2017-10-10 DIAGNOSIS — N2581 Secondary hyperparathyroidism of renal origin: Secondary | ICD-10-CM | POA: Diagnosis not present

## 2017-10-12 DIAGNOSIS — N2581 Secondary hyperparathyroidism of renal origin: Secondary | ICD-10-CM | POA: Diagnosis not present

## 2017-10-12 DIAGNOSIS — N186 End stage renal disease: Secondary | ICD-10-CM | POA: Diagnosis not present

## 2017-10-12 DIAGNOSIS — D509 Iron deficiency anemia, unspecified: Secondary | ICD-10-CM | POA: Diagnosis not present

## 2017-10-12 DIAGNOSIS — Z992 Dependence on renal dialysis: Secondary | ICD-10-CM | POA: Diagnosis not present

## 2017-10-12 DIAGNOSIS — D631 Anemia in chronic kidney disease: Secondary | ICD-10-CM | POA: Diagnosis not present

## 2017-10-15 DIAGNOSIS — D509 Iron deficiency anemia, unspecified: Secondary | ICD-10-CM | POA: Diagnosis not present

## 2017-10-15 DIAGNOSIS — Z992 Dependence on renal dialysis: Secondary | ICD-10-CM | POA: Diagnosis not present

## 2017-10-15 DIAGNOSIS — N2581 Secondary hyperparathyroidism of renal origin: Secondary | ICD-10-CM | POA: Diagnosis not present

## 2017-10-15 DIAGNOSIS — D631 Anemia in chronic kidney disease: Secondary | ICD-10-CM | POA: Diagnosis not present

## 2017-10-15 DIAGNOSIS — N186 End stage renal disease: Secondary | ICD-10-CM | POA: Diagnosis not present

## 2017-10-17 DIAGNOSIS — N2581 Secondary hyperparathyroidism of renal origin: Secondary | ICD-10-CM | POA: Diagnosis not present

## 2017-10-17 DIAGNOSIS — N186 End stage renal disease: Secondary | ICD-10-CM | POA: Diagnosis not present

## 2017-10-17 DIAGNOSIS — D631 Anemia in chronic kidney disease: Secondary | ICD-10-CM | POA: Diagnosis not present

## 2017-10-17 DIAGNOSIS — D509 Iron deficiency anemia, unspecified: Secondary | ICD-10-CM | POA: Diagnosis not present

## 2017-10-17 DIAGNOSIS — Z992 Dependence on renal dialysis: Secondary | ICD-10-CM | POA: Diagnosis not present

## 2017-10-19 DIAGNOSIS — Z992 Dependence on renal dialysis: Secondary | ICD-10-CM | POA: Diagnosis not present

## 2017-10-19 DIAGNOSIS — D509 Iron deficiency anemia, unspecified: Secondary | ICD-10-CM | POA: Diagnosis not present

## 2017-10-19 DIAGNOSIS — D631 Anemia in chronic kidney disease: Secondary | ICD-10-CM | POA: Diagnosis not present

## 2017-10-19 DIAGNOSIS — N186 End stage renal disease: Secondary | ICD-10-CM | POA: Diagnosis not present

## 2017-10-19 DIAGNOSIS — N2581 Secondary hyperparathyroidism of renal origin: Secondary | ICD-10-CM | POA: Diagnosis not present

## 2017-10-22 DIAGNOSIS — N186 End stage renal disease: Secondary | ICD-10-CM | POA: Diagnosis not present

## 2017-10-22 DIAGNOSIS — N2581 Secondary hyperparathyroidism of renal origin: Secondary | ICD-10-CM | POA: Diagnosis not present

## 2017-10-22 DIAGNOSIS — Z992 Dependence on renal dialysis: Secondary | ICD-10-CM | POA: Diagnosis not present

## 2017-10-22 DIAGNOSIS — D509 Iron deficiency anemia, unspecified: Secondary | ICD-10-CM | POA: Diagnosis not present

## 2017-10-22 DIAGNOSIS — D631 Anemia in chronic kidney disease: Secondary | ICD-10-CM | POA: Diagnosis not present

## 2017-10-24 DIAGNOSIS — D509 Iron deficiency anemia, unspecified: Secondary | ICD-10-CM | POA: Diagnosis not present

## 2017-10-24 DIAGNOSIS — N186 End stage renal disease: Secondary | ICD-10-CM | POA: Diagnosis not present

## 2017-10-24 DIAGNOSIS — D631 Anemia in chronic kidney disease: Secondary | ICD-10-CM | POA: Diagnosis not present

## 2017-10-24 DIAGNOSIS — Z992 Dependence on renal dialysis: Secondary | ICD-10-CM | POA: Diagnosis not present

## 2017-10-24 DIAGNOSIS — N2581 Secondary hyperparathyroidism of renal origin: Secondary | ICD-10-CM | POA: Diagnosis not present

## 2017-10-26 DIAGNOSIS — Z992 Dependence on renal dialysis: Secondary | ICD-10-CM | POA: Diagnosis not present

## 2017-10-26 DIAGNOSIS — D631 Anemia in chronic kidney disease: Secondary | ICD-10-CM | POA: Diagnosis not present

## 2017-10-26 DIAGNOSIS — Z23 Encounter for immunization: Secondary | ICD-10-CM | POA: Diagnosis not present

## 2017-10-26 DIAGNOSIS — N186 End stage renal disease: Secondary | ICD-10-CM | POA: Diagnosis not present

## 2017-10-26 DIAGNOSIS — I120 Hypertensive chronic kidney disease with stage 5 chronic kidney disease or end stage renal disease: Secondary | ICD-10-CM | POA: Diagnosis not present

## 2017-10-26 DIAGNOSIS — D509 Iron deficiency anemia, unspecified: Secondary | ICD-10-CM | POA: Diagnosis not present

## 2017-10-26 DIAGNOSIS — N2581 Secondary hyperparathyroidism of renal origin: Secondary | ICD-10-CM | POA: Diagnosis not present

## 2017-10-29 DIAGNOSIS — Z992 Dependence on renal dialysis: Secondary | ICD-10-CM | POA: Diagnosis not present

## 2017-10-29 DIAGNOSIS — N186 End stage renal disease: Secondary | ICD-10-CM | POA: Diagnosis not present

## 2017-10-29 DIAGNOSIS — N2581 Secondary hyperparathyroidism of renal origin: Secondary | ICD-10-CM | POA: Diagnosis not present

## 2017-10-29 DIAGNOSIS — Z23 Encounter for immunization: Secondary | ICD-10-CM | POA: Diagnosis not present

## 2017-10-29 DIAGNOSIS — D631 Anemia in chronic kidney disease: Secondary | ICD-10-CM | POA: Diagnosis not present

## 2017-10-29 DIAGNOSIS — D509 Iron deficiency anemia, unspecified: Secondary | ICD-10-CM | POA: Diagnosis not present

## 2017-10-31 DIAGNOSIS — Z992 Dependence on renal dialysis: Secondary | ICD-10-CM | POA: Diagnosis not present

## 2017-10-31 DIAGNOSIS — N2581 Secondary hyperparathyroidism of renal origin: Secondary | ICD-10-CM | POA: Diagnosis not present

## 2017-10-31 DIAGNOSIS — D631 Anemia in chronic kidney disease: Secondary | ICD-10-CM | POA: Diagnosis not present

## 2017-10-31 DIAGNOSIS — Z23 Encounter for immunization: Secondary | ICD-10-CM | POA: Diagnosis not present

## 2017-10-31 DIAGNOSIS — D509 Iron deficiency anemia, unspecified: Secondary | ICD-10-CM | POA: Diagnosis not present

## 2017-10-31 DIAGNOSIS — N186 End stage renal disease: Secondary | ICD-10-CM | POA: Diagnosis not present

## 2017-11-02 DIAGNOSIS — Z23 Encounter for immunization: Secondary | ICD-10-CM | POA: Diagnosis not present

## 2017-11-02 DIAGNOSIS — D509 Iron deficiency anemia, unspecified: Secondary | ICD-10-CM | POA: Diagnosis not present

## 2017-11-02 DIAGNOSIS — D631 Anemia in chronic kidney disease: Secondary | ICD-10-CM | POA: Diagnosis not present

## 2017-11-02 DIAGNOSIS — Z992 Dependence on renal dialysis: Secondary | ICD-10-CM | POA: Diagnosis not present

## 2017-11-02 DIAGNOSIS — N186 End stage renal disease: Secondary | ICD-10-CM | POA: Diagnosis not present

## 2017-11-02 DIAGNOSIS — N2581 Secondary hyperparathyroidism of renal origin: Secondary | ICD-10-CM | POA: Diagnosis not present

## 2017-11-05 DIAGNOSIS — Z992 Dependence on renal dialysis: Secondary | ICD-10-CM | POA: Diagnosis not present

## 2017-11-05 DIAGNOSIS — D509 Iron deficiency anemia, unspecified: Secondary | ICD-10-CM | POA: Diagnosis not present

## 2017-11-05 DIAGNOSIS — N186 End stage renal disease: Secondary | ICD-10-CM | POA: Diagnosis not present

## 2017-11-05 DIAGNOSIS — Z23 Encounter for immunization: Secondary | ICD-10-CM | POA: Diagnosis not present

## 2017-11-05 DIAGNOSIS — N2581 Secondary hyperparathyroidism of renal origin: Secondary | ICD-10-CM | POA: Diagnosis not present

## 2017-11-05 DIAGNOSIS — D631 Anemia in chronic kidney disease: Secondary | ICD-10-CM | POA: Diagnosis not present

## 2017-11-07 DIAGNOSIS — Z992 Dependence on renal dialysis: Secondary | ICD-10-CM | POA: Diagnosis not present

## 2017-11-07 DIAGNOSIS — D509 Iron deficiency anemia, unspecified: Secondary | ICD-10-CM | POA: Diagnosis not present

## 2017-11-07 DIAGNOSIS — Z23 Encounter for immunization: Secondary | ICD-10-CM | POA: Diagnosis not present

## 2017-11-07 DIAGNOSIS — D631 Anemia in chronic kidney disease: Secondary | ICD-10-CM | POA: Diagnosis not present

## 2017-11-07 DIAGNOSIS — N186 End stage renal disease: Secondary | ICD-10-CM | POA: Diagnosis not present

## 2017-11-07 DIAGNOSIS — N2581 Secondary hyperparathyroidism of renal origin: Secondary | ICD-10-CM | POA: Diagnosis not present

## 2017-11-09 DIAGNOSIS — N2581 Secondary hyperparathyroidism of renal origin: Secondary | ICD-10-CM | POA: Diagnosis not present

## 2017-11-09 DIAGNOSIS — Z992 Dependence on renal dialysis: Secondary | ICD-10-CM | POA: Diagnosis not present

## 2017-11-09 DIAGNOSIS — D631 Anemia in chronic kidney disease: Secondary | ICD-10-CM | POA: Diagnosis not present

## 2017-11-09 DIAGNOSIS — D509 Iron deficiency anemia, unspecified: Secondary | ICD-10-CM | POA: Diagnosis not present

## 2017-11-09 DIAGNOSIS — Z23 Encounter for immunization: Secondary | ICD-10-CM | POA: Diagnosis not present

## 2017-11-09 DIAGNOSIS — N186 End stage renal disease: Secondary | ICD-10-CM | POA: Diagnosis not present

## 2017-11-12 DIAGNOSIS — Z23 Encounter for immunization: Secondary | ICD-10-CM | POA: Diagnosis not present

## 2017-11-12 DIAGNOSIS — N2581 Secondary hyperparathyroidism of renal origin: Secondary | ICD-10-CM | POA: Diagnosis not present

## 2017-11-12 DIAGNOSIS — Z992 Dependence on renal dialysis: Secondary | ICD-10-CM | POA: Diagnosis not present

## 2017-11-12 DIAGNOSIS — N186 End stage renal disease: Secondary | ICD-10-CM | POA: Diagnosis not present

## 2017-11-12 DIAGNOSIS — D509 Iron deficiency anemia, unspecified: Secondary | ICD-10-CM | POA: Diagnosis not present

## 2017-11-12 DIAGNOSIS — D631 Anemia in chronic kidney disease: Secondary | ICD-10-CM | POA: Diagnosis not present

## 2017-11-14 DIAGNOSIS — D631 Anemia in chronic kidney disease: Secondary | ICD-10-CM | POA: Diagnosis not present

## 2017-11-14 DIAGNOSIS — N186 End stage renal disease: Secondary | ICD-10-CM | POA: Diagnosis not present

## 2017-11-14 DIAGNOSIS — D509 Iron deficiency anemia, unspecified: Secondary | ICD-10-CM | POA: Diagnosis not present

## 2017-11-14 DIAGNOSIS — N2581 Secondary hyperparathyroidism of renal origin: Secondary | ICD-10-CM | POA: Diagnosis not present

## 2017-11-14 DIAGNOSIS — Z23 Encounter for immunization: Secondary | ICD-10-CM | POA: Diagnosis not present

## 2017-11-14 DIAGNOSIS — Z992 Dependence on renal dialysis: Secondary | ICD-10-CM | POA: Diagnosis not present

## 2017-11-16 DIAGNOSIS — N186 End stage renal disease: Secondary | ICD-10-CM | POA: Diagnosis not present

## 2017-11-16 DIAGNOSIS — Z23 Encounter for immunization: Secondary | ICD-10-CM | POA: Diagnosis not present

## 2017-11-16 DIAGNOSIS — N2581 Secondary hyperparathyroidism of renal origin: Secondary | ICD-10-CM | POA: Diagnosis not present

## 2017-11-16 DIAGNOSIS — D509 Iron deficiency anemia, unspecified: Secondary | ICD-10-CM | POA: Diagnosis not present

## 2017-11-16 DIAGNOSIS — D631 Anemia in chronic kidney disease: Secondary | ICD-10-CM | POA: Diagnosis not present

## 2017-11-16 DIAGNOSIS — Z992 Dependence on renal dialysis: Secondary | ICD-10-CM | POA: Diagnosis not present

## 2017-11-19 DIAGNOSIS — N186 End stage renal disease: Secondary | ICD-10-CM | POA: Diagnosis not present

## 2017-11-19 DIAGNOSIS — D631 Anemia in chronic kidney disease: Secondary | ICD-10-CM | POA: Diagnosis not present

## 2017-11-19 DIAGNOSIS — D509 Iron deficiency anemia, unspecified: Secondary | ICD-10-CM | POA: Diagnosis not present

## 2017-11-19 DIAGNOSIS — N2581 Secondary hyperparathyroidism of renal origin: Secondary | ICD-10-CM | POA: Diagnosis not present

## 2017-11-19 DIAGNOSIS — Z992 Dependence on renal dialysis: Secondary | ICD-10-CM | POA: Diagnosis not present

## 2017-11-19 DIAGNOSIS — Z23 Encounter for immunization: Secondary | ICD-10-CM | POA: Diagnosis not present

## 2017-11-21 DIAGNOSIS — N2581 Secondary hyperparathyroidism of renal origin: Secondary | ICD-10-CM | POA: Diagnosis not present

## 2017-11-21 DIAGNOSIS — D631 Anemia in chronic kidney disease: Secondary | ICD-10-CM | POA: Diagnosis not present

## 2017-11-21 DIAGNOSIS — Z23 Encounter for immunization: Secondary | ICD-10-CM | POA: Diagnosis not present

## 2017-11-21 DIAGNOSIS — D509 Iron deficiency anemia, unspecified: Secondary | ICD-10-CM | POA: Diagnosis not present

## 2017-11-21 DIAGNOSIS — N186 End stage renal disease: Secondary | ICD-10-CM | POA: Diagnosis not present

## 2017-11-21 DIAGNOSIS — Z992 Dependence on renal dialysis: Secondary | ICD-10-CM | POA: Diagnosis not present

## 2017-11-23 DIAGNOSIS — N186 End stage renal disease: Secondary | ICD-10-CM | POA: Diagnosis not present

## 2017-11-23 DIAGNOSIS — D509 Iron deficiency anemia, unspecified: Secondary | ICD-10-CM | POA: Diagnosis not present

## 2017-11-23 DIAGNOSIS — N2581 Secondary hyperparathyroidism of renal origin: Secondary | ICD-10-CM | POA: Diagnosis not present

## 2017-11-23 DIAGNOSIS — D631 Anemia in chronic kidney disease: Secondary | ICD-10-CM | POA: Diagnosis not present

## 2017-11-23 DIAGNOSIS — Z992 Dependence on renal dialysis: Secondary | ICD-10-CM | POA: Diagnosis not present

## 2017-11-23 DIAGNOSIS — Z23 Encounter for immunization: Secondary | ICD-10-CM | POA: Diagnosis not present

## 2017-11-26 DIAGNOSIS — N186 End stage renal disease: Secondary | ICD-10-CM | POA: Diagnosis not present

## 2017-11-26 DIAGNOSIS — N2581 Secondary hyperparathyroidism of renal origin: Secondary | ICD-10-CM | POA: Diagnosis not present

## 2017-11-26 DIAGNOSIS — D509 Iron deficiency anemia, unspecified: Secondary | ICD-10-CM | POA: Diagnosis not present

## 2017-11-26 DIAGNOSIS — Z992 Dependence on renal dialysis: Secondary | ICD-10-CM | POA: Diagnosis not present

## 2017-11-26 DIAGNOSIS — I120 Hypertensive chronic kidney disease with stage 5 chronic kidney disease or end stage renal disease: Secondary | ICD-10-CM | POA: Diagnosis not present

## 2017-11-28 DIAGNOSIS — N186 End stage renal disease: Secondary | ICD-10-CM | POA: Diagnosis not present

## 2017-11-28 DIAGNOSIS — D509 Iron deficiency anemia, unspecified: Secondary | ICD-10-CM | POA: Diagnosis not present

## 2017-11-28 DIAGNOSIS — N2581 Secondary hyperparathyroidism of renal origin: Secondary | ICD-10-CM | POA: Diagnosis not present

## 2017-11-28 DIAGNOSIS — Z992 Dependence on renal dialysis: Secondary | ICD-10-CM | POA: Diagnosis not present

## 2017-11-30 DIAGNOSIS — N2581 Secondary hyperparathyroidism of renal origin: Secondary | ICD-10-CM | POA: Diagnosis not present

## 2017-11-30 DIAGNOSIS — Z992 Dependence on renal dialysis: Secondary | ICD-10-CM | POA: Diagnosis not present

## 2017-11-30 DIAGNOSIS — D509 Iron deficiency anemia, unspecified: Secondary | ICD-10-CM | POA: Diagnosis not present

## 2017-11-30 DIAGNOSIS — N186 End stage renal disease: Secondary | ICD-10-CM | POA: Diagnosis not present

## 2017-12-03 DIAGNOSIS — N2581 Secondary hyperparathyroidism of renal origin: Secondary | ICD-10-CM | POA: Diagnosis not present

## 2017-12-03 DIAGNOSIS — N186 End stage renal disease: Secondary | ICD-10-CM | POA: Diagnosis not present

## 2017-12-03 DIAGNOSIS — Z992 Dependence on renal dialysis: Secondary | ICD-10-CM | POA: Diagnosis not present

## 2017-12-03 DIAGNOSIS — D509 Iron deficiency anemia, unspecified: Secondary | ICD-10-CM | POA: Diagnosis not present

## 2017-12-05 DIAGNOSIS — D509 Iron deficiency anemia, unspecified: Secondary | ICD-10-CM | POA: Diagnosis not present

## 2017-12-05 DIAGNOSIS — N186 End stage renal disease: Secondary | ICD-10-CM | POA: Diagnosis not present

## 2017-12-05 DIAGNOSIS — N2581 Secondary hyperparathyroidism of renal origin: Secondary | ICD-10-CM | POA: Diagnosis not present

## 2017-12-05 DIAGNOSIS — Z992 Dependence on renal dialysis: Secondary | ICD-10-CM | POA: Diagnosis not present

## 2017-12-07 DIAGNOSIS — N186 End stage renal disease: Secondary | ICD-10-CM | POA: Diagnosis not present

## 2017-12-07 DIAGNOSIS — Z992 Dependence on renal dialysis: Secondary | ICD-10-CM | POA: Diagnosis not present

## 2017-12-07 DIAGNOSIS — N2581 Secondary hyperparathyroidism of renal origin: Secondary | ICD-10-CM | POA: Diagnosis not present

## 2017-12-07 DIAGNOSIS — D509 Iron deficiency anemia, unspecified: Secondary | ICD-10-CM | POA: Diagnosis not present

## 2017-12-10 DIAGNOSIS — D509 Iron deficiency anemia, unspecified: Secondary | ICD-10-CM | POA: Diagnosis not present

## 2017-12-10 DIAGNOSIS — N186 End stage renal disease: Secondary | ICD-10-CM | POA: Diagnosis not present

## 2017-12-10 DIAGNOSIS — N2581 Secondary hyperparathyroidism of renal origin: Secondary | ICD-10-CM | POA: Diagnosis not present

## 2017-12-10 DIAGNOSIS — Z992 Dependence on renal dialysis: Secondary | ICD-10-CM | POA: Diagnosis not present

## 2017-12-12 DIAGNOSIS — Z992 Dependence on renal dialysis: Secondary | ICD-10-CM | POA: Diagnosis not present

## 2017-12-12 DIAGNOSIS — N2581 Secondary hyperparathyroidism of renal origin: Secondary | ICD-10-CM | POA: Diagnosis not present

## 2017-12-12 DIAGNOSIS — N186 End stage renal disease: Secondary | ICD-10-CM | POA: Diagnosis not present

## 2017-12-12 DIAGNOSIS — D509 Iron deficiency anemia, unspecified: Secondary | ICD-10-CM | POA: Diagnosis not present

## 2017-12-14 DIAGNOSIS — D509 Iron deficiency anemia, unspecified: Secondary | ICD-10-CM | POA: Diagnosis not present

## 2017-12-14 DIAGNOSIS — Z992 Dependence on renal dialysis: Secondary | ICD-10-CM | POA: Diagnosis not present

## 2017-12-14 DIAGNOSIS — N2581 Secondary hyperparathyroidism of renal origin: Secondary | ICD-10-CM | POA: Diagnosis not present

## 2017-12-14 DIAGNOSIS — N186 End stage renal disease: Secondary | ICD-10-CM | POA: Diagnosis not present

## 2017-12-17 DIAGNOSIS — N186 End stage renal disease: Secondary | ICD-10-CM | POA: Diagnosis not present

## 2017-12-17 DIAGNOSIS — Z992 Dependence on renal dialysis: Secondary | ICD-10-CM | POA: Diagnosis not present

## 2017-12-17 DIAGNOSIS — N2581 Secondary hyperparathyroidism of renal origin: Secondary | ICD-10-CM | POA: Diagnosis not present

## 2017-12-17 DIAGNOSIS — D509 Iron deficiency anemia, unspecified: Secondary | ICD-10-CM | POA: Diagnosis not present

## 2017-12-19 DIAGNOSIS — D509 Iron deficiency anemia, unspecified: Secondary | ICD-10-CM | POA: Diagnosis not present

## 2017-12-19 DIAGNOSIS — N186 End stage renal disease: Secondary | ICD-10-CM | POA: Diagnosis not present

## 2017-12-19 DIAGNOSIS — N2581 Secondary hyperparathyroidism of renal origin: Secondary | ICD-10-CM | POA: Diagnosis not present

## 2017-12-19 DIAGNOSIS — Z992 Dependence on renal dialysis: Secondary | ICD-10-CM | POA: Diagnosis not present

## 2017-12-21 DIAGNOSIS — N2581 Secondary hyperparathyroidism of renal origin: Secondary | ICD-10-CM | POA: Diagnosis not present

## 2017-12-21 DIAGNOSIS — Z992 Dependence on renal dialysis: Secondary | ICD-10-CM | POA: Diagnosis not present

## 2017-12-21 DIAGNOSIS — N186 End stage renal disease: Secondary | ICD-10-CM | POA: Diagnosis not present

## 2017-12-21 DIAGNOSIS — D509 Iron deficiency anemia, unspecified: Secondary | ICD-10-CM | POA: Diagnosis not present

## 2017-12-24 DIAGNOSIS — D509 Iron deficiency anemia, unspecified: Secondary | ICD-10-CM | POA: Diagnosis not present

## 2017-12-24 DIAGNOSIS — Z992 Dependence on renal dialysis: Secondary | ICD-10-CM | POA: Diagnosis not present

## 2017-12-24 DIAGNOSIS — N186 End stage renal disease: Secondary | ICD-10-CM | POA: Diagnosis not present

## 2017-12-24 DIAGNOSIS — N2581 Secondary hyperparathyroidism of renal origin: Secondary | ICD-10-CM | POA: Diagnosis not present

## 2017-12-26 DIAGNOSIS — Z992 Dependence on renal dialysis: Secondary | ICD-10-CM | POA: Diagnosis not present

## 2017-12-26 DIAGNOSIS — N186 End stage renal disease: Secondary | ICD-10-CM | POA: Diagnosis not present

## 2017-12-26 DIAGNOSIS — I120 Hypertensive chronic kidney disease with stage 5 chronic kidney disease or end stage renal disease: Secondary | ICD-10-CM | POA: Diagnosis not present

## 2017-12-26 DIAGNOSIS — N2581 Secondary hyperparathyroidism of renal origin: Secondary | ICD-10-CM | POA: Diagnosis not present

## 2017-12-28 DIAGNOSIS — Z992 Dependence on renal dialysis: Secondary | ICD-10-CM | POA: Diagnosis not present

## 2017-12-28 DIAGNOSIS — N2581 Secondary hyperparathyroidism of renal origin: Secondary | ICD-10-CM | POA: Diagnosis not present

## 2017-12-28 DIAGNOSIS — N186 End stage renal disease: Secondary | ICD-10-CM | POA: Diagnosis not present

## 2017-12-31 DIAGNOSIS — N2581 Secondary hyperparathyroidism of renal origin: Secondary | ICD-10-CM | POA: Diagnosis not present

## 2017-12-31 DIAGNOSIS — N186 End stage renal disease: Secondary | ICD-10-CM | POA: Diagnosis not present

## 2017-12-31 DIAGNOSIS — Z992 Dependence on renal dialysis: Secondary | ICD-10-CM | POA: Diagnosis not present

## 2018-01-02 DIAGNOSIS — Z992 Dependence on renal dialysis: Secondary | ICD-10-CM | POA: Diagnosis not present

## 2018-01-02 DIAGNOSIS — N186 End stage renal disease: Secondary | ICD-10-CM | POA: Diagnosis not present

## 2018-01-02 DIAGNOSIS — N2581 Secondary hyperparathyroidism of renal origin: Secondary | ICD-10-CM | POA: Diagnosis not present

## 2018-01-04 DIAGNOSIS — N2581 Secondary hyperparathyroidism of renal origin: Secondary | ICD-10-CM | POA: Diagnosis not present

## 2018-01-04 DIAGNOSIS — Z992 Dependence on renal dialysis: Secondary | ICD-10-CM | POA: Diagnosis not present

## 2018-01-04 DIAGNOSIS — N186 End stage renal disease: Secondary | ICD-10-CM | POA: Diagnosis not present

## 2018-01-07 DIAGNOSIS — N2581 Secondary hyperparathyroidism of renal origin: Secondary | ICD-10-CM | POA: Diagnosis not present

## 2018-01-07 DIAGNOSIS — Z992 Dependence on renal dialysis: Secondary | ICD-10-CM | POA: Diagnosis not present

## 2018-01-07 DIAGNOSIS — N186 End stage renal disease: Secondary | ICD-10-CM | POA: Diagnosis not present

## 2018-01-09 DIAGNOSIS — Z992 Dependence on renal dialysis: Secondary | ICD-10-CM | POA: Diagnosis not present

## 2018-01-09 DIAGNOSIS — N186 End stage renal disease: Secondary | ICD-10-CM | POA: Diagnosis not present

## 2018-01-09 DIAGNOSIS — N2581 Secondary hyperparathyroidism of renal origin: Secondary | ICD-10-CM | POA: Diagnosis not present

## 2018-01-11 DIAGNOSIS — N2581 Secondary hyperparathyroidism of renal origin: Secondary | ICD-10-CM | POA: Diagnosis not present

## 2018-01-11 DIAGNOSIS — N186 End stage renal disease: Secondary | ICD-10-CM | POA: Diagnosis not present

## 2018-01-11 DIAGNOSIS — Z992 Dependence on renal dialysis: Secondary | ICD-10-CM | POA: Diagnosis not present

## 2018-01-14 DIAGNOSIS — Z992 Dependence on renal dialysis: Secondary | ICD-10-CM | POA: Diagnosis not present

## 2018-01-14 DIAGNOSIS — N2581 Secondary hyperparathyroidism of renal origin: Secondary | ICD-10-CM | POA: Diagnosis not present

## 2018-01-14 DIAGNOSIS — N186 End stage renal disease: Secondary | ICD-10-CM | POA: Diagnosis not present

## 2018-01-16 DIAGNOSIS — Z992 Dependence on renal dialysis: Secondary | ICD-10-CM | POA: Diagnosis not present

## 2018-01-16 DIAGNOSIS — N186 End stage renal disease: Secondary | ICD-10-CM | POA: Diagnosis not present

## 2018-01-16 DIAGNOSIS — N2581 Secondary hyperparathyroidism of renal origin: Secondary | ICD-10-CM | POA: Diagnosis not present

## 2018-01-18 DIAGNOSIS — Z992 Dependence on renal dialysis: Secondary | ICD-10-CM | POA: Diagnosis not present

## 2018-01-18 DIAGNOSIS — N2581 Secondary hyperparathyroidism of renal origin: Secondary | ICD-10-CM | POA: Diagnosis not present

## 2018-01-18 DIAGNOSIS — N186 End stage renal disease: Secondary | ICD-10-CM | POA: Diagnosis not present

## 2018-01-21 DIAGNOSIS — N2581 Secondary hyperparathyroidism of renal origin: Secondary | ICD-10-CM | POA: Diagnosis not present

## 2018-01-21 DIAGNOSIS — Z992 Dependence on renal dialysis: Secondary | ICD-10-CM | POA: Diagnosis not present

## 2018-01-21 DIAGNOSIS — N186 End stage renal disease: Secondary | ICD-10-CM | POA: Diagnosis not present

## 2018-01-23 DIAGNOSIS — N186 End stage renal disease: Secondary | ICD-10-CM | POA: Diagnosis not present

## 2018-01-23 DIAGNOSIS — Z992 Dependence on renal dialysis: Secondary | ICD-10-CM | POA: Diagnosis not present

## 2018-01-23 DIAGNOSIS — N2581 Secondary hyperparathyroidism of renal origin: Secondary | ICD-10-CM | POA: Diagnosis not present

## 2018-01-25 DIAGNOSIS — N2581 Secondary hyperparathyroidism of renal origin: Secondary | ICD-10-CM | POA: Diagnosis not present

## 2018-01-25 DIAGNOSIS — N186 End stage renal disease: Secondary | ICD-10-CM | POA: Diagnosis not present

## 2018-01-25 DIAGNOSIS — Z992 Dependence on renal dialysis: Secondary | ICD-10-CM | POA: Diagnosis not present

## 2018-01-26 DIAGNOSIS — N186 End stage renal disease: Secondary | ICD-10-CM | POA: Diagnosis not present

## 2018-01-26 DIAGNOSIS — I120 Hypertensive chronic kidney disease with stage 5 chronic kidney disease or end stage renal disease: Secondary | ICD-10-CM | POA: Diagnosis not present

## 2018-01-26 DIAGNOSIS — Z992 Dependence on renal dialysis: Secondary | ICD-10-CM | POA: Diagnosis not present

## 2018-01-28 DIAGNOSIS — N186 End stage renal disease: Secondary | ICD-10-CM | POA: Diagnosis not present

## 2018-01-28 DIAGNOSIS — D631 Anemia in chronic kidney disease: Secondary | ICD-10-CM | POA: Diagnosis not present

## 2018-01-28 DIAGNOSIS — N2581 Secondary hyperparathyroidism of renal origin: Secondary | ICD-10-CM | POA: Diagnosis not present

## 2018-01-28 DIAGNOSIS — Z992 Dependence on renal dialysis: Secondary | ICD-10-CM | POA: Diagnosis not present

## 2018-01-30 DIAGNOSIS — Z992 Dependence on renal dialysis: Secondary | ICD-10-CM | POA: Diagnosis not present

## 2018-01-30 DIAGNOSIS — N186 End stage renal disease: Secondary | ICD-10-CM | POA: Diagnosis not present

## 2018-01-30 DIAGNOSIS — N2581 Secondary hyperparathyroidism of renal origin: Secondary | ICD-10-CM | POA: Diagnosis not present

## 2018-01-30 DIAGNOSIS — D631 Anemia in chronic kidney disease: Secondary | ICD-10-CM | POA: Diagnosis not present

## 2018-02-01 DIAGNOSIS — N2581 Secondary hyperparathyroidism of renal origin: Secondary | ICD-10-CM | POA: Diagnosis not present

## 2018-02-01 DIAGNOSIS — D631 Anemia in chronic kidney disease: Secondary | ICD-10-CM | POA: Diagnosis not present

## 2018-02-01 DIAGNOSIS — N186 End stage renal disease: Secondary | ICD-10-CM | POA: Diagnosis not present

## 2018-02-01 DIAGNOSIS — Z992 Dependence on renal dialysis: Secondary | ICD-10-CM | POA: Diagnosis not present

## 2018-02-04 DIAGNOSIS — N2581 Secondary hyperparathyroidism of renal origin: Secondary | ICD-10-CM | POA: Diagnosis not present

## 2018-02-04 DIAGNOSIS — Z992 Dependence on renal dialysis: Secondary | ICD-10-CM | POA: Diagnosis not present

## 2018-02-04 DIAGNOSIS — D631 Anemia in chronic kidney disease: Secondary | ICD-10-CM | POA: Diagnosis not present

## 2018-02-04 DIAGNOSIS — N186 End stage renal disease: Secondary | ICD-10-CM | POA: Diagnosis not present

## 2018-02-06 DIAGNOSIS — N2581 Secondary hyperparathyroidism of renal origin: Secondary | ICD-10-CM | POA: Diagnosis not present

## 2018-02-06 DIAGNOSIS — D631 Anemia in chronic kidney disease: Secondary | ICD-10-CM | POA: Diagnosis not present

## 2018-02-06 DIAGNOSIS — N186 End stage renal disease: Secondary | ICD-10-CM | POA: Diagnosis not present

## 2018-02-06 DIAGNOSIS — Z992 Dependence on renal dialysis: Secondary | ICD-10-CM | POA: Diagnosis not present

## 2018-02-08 DIAGNOSIS — Z992 Dependence on renal dialysis: Secondary | ICD-10-CM | POA: Diagnosis not present

## 2018-02-08 DIAGNOSIS — N186 End stage renal disease: Secondary | ICD-10-CM | POA: Diagnosis not present

## 2018-02-08 DIAGNOSIS — N2581 Secondary hyperparathyroidism of renal origin: Secondary | ICD-10-CM | POA: Diagnosis not present

## 2018-02-08 DIAGNOSIS — D631 Anemia in chronic kidney disease: Secondary | ICD-10-CM | POA: Diagnosis not present

## 2018-02-11 DIAGNOSIS — N186 End stage renal disease: Secondary | ICD-10-CM | POA: Diagnosis not present

## 2018-02-11 DIAGNOSIS — D631 Anemia in chronic kidney disease: Secondary | ICD-10-CM | POA: Diagnosis not present

## 2018-02-11 DIAGNOSIS — Z992 Dependence on renal dialysis: Secondary | ICD-10-CM | POA: Diagnosis not present

## 2018-02-11 DIAGNOSIS — N2581 Secondary hyperparathyroidism of renal origin: Secondary | ICD-10-CM | POA: Diagnosis not present

## 2018-02-13 DIAGNOSIS — N2581 Secondary hyperparathyroidism of renal origin: Secondary | ICD-10-CM | POA: Diagnosis not present

## 2018-02-13 DIAGNOSIS — D631 Anemia in chronic kidney disease: Secondary | ICD-10-CM | POA: Diagnosis not present

## 2018-02-13 DIAGNOSIS — N186 End stage renal disease: Secondary | ICD-10-CM | POA: Diagnosis not present

## 2018-02-13 DIAGNOSIS — Z992 Dependence on renal dialysis: Secondary | ICD-10-CM | POA: Diagnosis not present

## 2018-02-15 DIAGNOSIS — N2581 Secondary hyperparathyroidism of renal origin: Secondary | ICD-10-CM | POA: Diagnosis not present

## 2018-02-15 DIAGNOSIS — D631 Anemia in chronic kidney disease: Secondary | ICD-10-CM | POA: Diagnosis not present

## 2018-02-15 DIAGNOSIS — Z992 Dependence on renal dialysis: Secondary | ICD-10-CM | POA: Diagnosis not present

## 2018-02-15 DIAGNOSIS — N186 End stage renal disease: Secondary | ICD-10-CM | POA: Diagnosis not present

## 2018-02-18 DIAGNOSIS — N186 End stage renal disease: Secondary | ICD-10-CM | POA: Diagnosis not present

## 2018-02-18 DIAGNOSIS — N2581 Secondary hyperparathyroidism of renal origin: Secondary | ICD-10-CM | POA: Diagnosis not present

## 2018-02-18 DIAGNOSIS — Z992 Dependence on renal dialysis: Secondary | ICD-10-CM | POA: Diagnosis not present

## 2018-02-18 DIAGNOSIS — D631 Anemia in chronic kidney disease: Secondary | ICD-10-CM | POA: Diagnosis not present

## 2018-02-20 DIAGNOSIS — D631 Anemia in chronic kidney disease: Secondary | ICD-10-CM | POA: Diagnosis not present

## 2018-02-20 DIAGNOSIS — N2581 Secondary hyperparathyroidism of renal origin: Secondary | ICD-10-CM | POA: Diagnosis not present

## 2018-02-20 DIAGNOSIS — Z992 Dependence on renal dialysis: Secondary | ICD-10-CM | POA: Diagnosis not present

## 2018-02-20 DIAGNOSIS — N186 End stage renal disease: Secondary | ICD-10-CM | POA: Diagnosis not present

## 2018-02-21 ENCOUNTER — Ambulatory Visit (INDEPENDENT_AMBULATORY_CARE_PROVIDER_SITE_OTHER): Payer: Medicare Other | Admitting: Neurology

## 2018-02-21 ENCOUNTER — Encounter: Payer: Self-pay | Admitting: Neurology

## 2018-02-21 VITALS — BP 150/74 | HR 80 | Ht 75.5 in

## 2018-02-21 DIAGNOSIS — M792 Neuralgia and neuritis, unspecified: Secondary | ICD-10-CM | POA: Diagnosis not present

## 2018-02-21 NOTE — Progress Notes (Signed)
Reason for visit: Neuralgia  Marco Arnold is an 52 y.o. male  History of present illness:  Marco Arnold is a 52 year old right-handed white male with a history of stage renal disease on dialysis.  The patient has developed neuropathic pain in the distribution of the dorsal ramus of the T7 nerve root on the left.  The patient was placed on topical lidocaine but he did not believe that this was helpful.  He has been to a pain center, they have started him on Cymbalta and nortriptyline which has been quite helpful.  He is able to function without discomfort at this point.  He does not sleep well at night, but this is not secondary to the pain.  He no longer has sharp lancinating pains or hypersensitivity to touch.  He continues on the gabapentin although this did not help previously.  Past Medical History:  Diagnosis Date  . Chronic kidney disease   . Dialysis patient [facility]) 2013  . Hypertension    dr  Tobie Lords      @ [facility]  med  . Neuropathic pain 09/20/2017   Left mid-thoracic    Past Surgical History:  Procedure Laterality Date  . AV FISTULA PLACEMENT  11/10/2011   Procedure: ARTERIOVENOUS (AV) FISTULA CREATION;  Surgeon: Mal Misty, MD;  Location: [place];  Service: Vascular;  Laterality: Left;  Ultrasound guided  . INSERTION OF DIALYSIS CATHETER  02/09/2012   Procedure: INSERTION OF DIALYSIS CATHETER;  Surgeon: Mal Misty, MD;  Location: [place];  Service: Vascular;  Laterality: N/A;  . SHUNTOGRAM Left 06/03/2012   Procedure: Earney Mallet;  Surgeon: Conrad [place], MD;  Location: [facility] CATH LAB;  Service: Cardiovascular;  Laterality: Left;  . SHUNTOGRAM Left 01/02/2013   Procedure: SHUNTOGRAM;  Surgeon: Conrad , MD;  Location: [facility] CATH LAB;  Service: Cardiovascular;  Laterality: Left;    Family History  Problem Relation Age of Onset  . Cancer Mother   . Diabetes Father   . Hypertension Father   . Cancer - Lung Paternal Grandfather     Social history:  reports that he has been  smoking cigarettes.  He has a 35.00 pack-year smoking history. He has never used smokeless tobacco. He reports that he drinks alcohol. He reports that he has current or past drug history. Drug: Marijuana.    Allergies  Allergen Reactions  . Lisinopril Other (See Comments)    Unable to breath    Medications:  Prior to Admission medications   Medication Sig Start Date End Date Taking? Authorizing Provider  amLODipine (NORVASC) 10 MG tablet 10 mg daily. 01/29/18  Yes [provider]  cabergoline (DOSTINEX) 0.5 MG tablet Take 2 mg by mouth See admin instructions. Twice weekly 07/13/16  Yes [provider]  cloNIDine (CATAPRES) 0.1 MG tablet Take 0.2 mg by mouth daily. 07/13/16  Yes [provider]  cyclobenzaprine (FLEXERIL) 10 MG tablet Take 10 mg by mouth 2 (two) times daily as needed for muscle spasms.    Yes [provider]  DULoxetine (CYMBALTA) 60 MG capsule 60 mg daily. 01/29/18  Yes [provider]  FOSRENOL 1000 MG chewable tablet Chew 1,000 mg by mouth daily. 08/31/17  Yes [provider]  gabapentin (NEURONTIN) 100 MG capsule Take 3 capsules (300 mg total) by mouth at bedtime. 09/20/17  Yes Kathrynn Ducking, MD  Lidocaine 2 % GEL Apply 1 g topically 3 (three) times daily as needed. 09/24/17  Yes Kathrynn Ducking, MD  losartan (COZAAR) 100 MG tablet Take 100 mg by mouth daily.   Yes [provider]  Multiple Vitamins-Minerals (MULTIVITAMINS THER. W/MINERALS) TABS Take 1 tablet by mouth daily.   Yes [provider]  naproxen sodium (ALEVE) 220 MG tablet Take by mouth as needed.   Yes [provider]  nortriptyline (PAMELOR) 25 MG capsule 25 mg daily. 01/29/18  Yes [provider]  pantoprazole (PROTONIX) 40 MG tablet Take 40 mg by mouth as needed (for stomach).  12/04/12  Yes [provider]  sildenafil (REVATIO) 20 MG tablet Take 60 mg by mouth daily as needed.   Yes [provider]    VELPHORO 500 MG chewable tablet Chew 2,000 mg by mouth 3 (three) times daily. 07/13/16  Yes [provider]  VENTOLIN HFA 108 (90 Base) MCG/ACT inhaler as needed. 12/21/17  Yes [provider]    ROS:  Out of a complete 14 system review of symptoms, the patient complains only of the following symptoms, and all other reviewed systems are negative.  Decreased weight, excessive sweating Wheezing Joint pain, joint swelling, back pain Moles Rectal bleeding  Blood pressure (!) 150/74, pulse 80, height 6' 3.5" (1.918 m).  Physical Exam  General: The patient is alert and cooperative at the time of the examination.  Skin: No significant peripheral edema is noted.   Neurologic Exam  Mental status: The patient is alert and oriented x 3 at the time of the examination. The patient has apparent normal recent and remote memory, with an apparently normal attention span and concentration ability.   Cranial nerves: Facial symmetry is present. Speech is normal, no aphasia or dysarthria is noted. Extraocular movements are full. Visual fields are full.  Motor: The patient has good strength in all 4 extremities.  Sensory examination: Soft touch sensation is symmetric on the face, arms, and legs.  Coordination: The patient has good finger-nose-finger and heel-to-shin bilaterally.  Gait and station: The patient has a normal gait. Tandem gait is slightly unsteady. Romberg is negative. No drift is seen.  Reflexes: Deep tendon reflexes are symmetric.   Assessment/Plan:  1.  Neuralgia pain  The patient is doing much better on the combination of Cymbalta and nortriptyline.  The patient is followed through a pain center.  He is not getting any medications to this office, he will follow-up here if needed.  He may be able to come off of his gabapentin.  Jill Alexanders MD 02/21/2018 8:14 AM  [facility] [address] [place], [place]  [address]  Phone [phone] Fax [phone]

## 2018-02-22 DIAGNOSIS — Z992 Dependence on renal dialysis: Secondary | ICD-10-CM | POA: Diagnosis not present

## 2018-02-22 DIAGNOSIS — N186 End stage renal disease: Secondary | ICD-10-CM | POA: Diagnosis not present

## 2018-02-22 DIAGNOSIS — D631 Anemia in chronic kidney disease: Secondary | ICD-10-CM | POA: Diagnosis not present

## 2018-02-22 DIAGNOSIS — N2581 Secondary hyperparathyroidism of renal origin: Secondary | ICD-10-CM | POA: Diagnosis not present

## 2018-02-25 DIAGNOSIS — Z992 Dependence on renal dialysis: Secondary | ICD-10-CM | POA: Diagnosis not present

## 2018-02-25 DIAGNOSIS — N186 End stage renal disease: Secondary | ICD-10-CM | POA: Diagnosis not present

## 2018-02-25 DIAGNOSIS — I120 Hypertensive chronic kidney disease with stage 5 chronic kidney disease or end stage renal disease: Secondary | ICD-10-CM | POA: Diagnosis not present

## 2018-02-25 DIAGNOSIS — E8779 Other fluid overload: Secondary | ICD-10-CM | POA: Diagnosis not present

## 2018-02-25 DIAGNOSIS — D509 Iron deficiency anemia, unspecified: Secondary | ICD-10-CM | POA: Diagnosis not present

## 2018-02-25 DIAGNOSIS — N2581 Secondary hyperparathyroidism of renal origin: Secondary | ICD-10-CM | POA: Diagnosis not present

## 2018-02-27 DIAGNOSIS — E8779 Other fluid overload: Secondary | ICD-10-CM | POA: Diagnosis not present

## 2018-02-27 DIAGNOSIS — N186 End stage renal disease: Secondary | ICD-10-CM | POA: Diagnosis not present

## 2018-02-27 DIAGNOSIS — N2581 Secondary hyperparathyroidism of renal origin: Secondary | ICD-10-CM | POA: Diagnosis not present

## 2018-02-27 DIAGNOSIS — D509 Iron deficiency anemia, unspecified: Secondary | ICD-10-CM | POA: Diagnosis not present

## 2018-02-27 DIAGNOSIS — Z992 Dependence on renal dialysis: Secondary | ICD-10-CM | POA: Diagnosis not present

## 2018-03-01 DIAGNOSIS — N186 End stage renal disease: Secondary | ICD-10-CM | POA: Diagnosis not present

## 2018-03-01 DIAGNOSIS — Z992 Dependence on renal dialysis: Secondary | ICD-10-CM | POA: Diagnosis not present

## 2018-03-01 DIAGNOSIS — E8779 Other fluid overload: Secondary | ICD-10-CM | POA: Diagnosis not present

## 2018-03-01 DIAGNOSIS — D509 Iron deficiency anemia, unspecified: Secondary | ICD-10-CM | POA: Diagnosis not present

## 2018-03-01 DIAGNOSIS — N2581 Secondary hyperparathyroidism of renal origin: Secondary | ICD-10-CM | POA: Diagnosis not present

## 2018-03-04 DIAGNOSIS — D509 Iron deficiency anemia, unspecified: Secondary | ICD-10-CM | POA: Diagnosis not present

## 2018-03-04 DIAGNOSIS — Z992 Dependence on renal dialysis: Secondary | ICD-10-CM | POA: Diagnosis not present

## 2018-03-04 DIAGNOSIS — E8779 Other fluid overload: Secondary | ICD-10-CM | POA: Diagnosis not present

## 2018-03-04 DIAGNOSIS — N186 End stage renal disease: Secondary | ICD-10-CM | POA: Diagnosis not present

## 2018-03-04 DIAGNOSIS — N2581 Secondary hyperparathyroidism of renal origin: Secondary | ICD-10-CM | POA: Diagnosis not present

## 2018-03-05 DIAGNOSIS — Z992 Dependence on renal dialysis: Secondary | ICD-10-CM | POA: Diagnosis not present

## 2018-03-05 DIAGNOSIS — N2581 Secondary hyperparathyroidism of renal origin: Secondary | ICD-10-CM | POA: Diagnosis not present

## 2018-03-05 DIAGNOSIS — D509 Iron deficiency anemia, unspecified: Secondary | ICD-10-CM | POA: Diagnosis not present

## 2018-03-05 DIAGNOSIS — N186 End stage renal disease: Secondary | ICD-10-CM | POA: Diagnosis not present

## 2018-03-05 DIAGNOSIS — E8779 Other fluid overload: Secondary | ICD-10-CM | POA: Diagnosis not present

## 2018-03-06 DIAGNOSIS — N186 End stage renal disease: Secondary | ICD-10-CM | POA: Diagnosis not present

## 2018-03-06 DIAGNOSIS — E8779 Other fluid overload: Secondary | ICD-10-CM | POA: Diagnosis not present

## 2018-03-06 DIAGNOSIS — Z992 Dependence on renal dialysis: Secondary | ICD-10-CM | POA: Diagnosis not present

## 2018-03-06 DIAGNOSIS — N2581 Secondary hyperparathyroidism of renal origin: Secondary | ICD-10-CM | POA: Diagnosis not present

## 2018-03-06 DIAGNOSIS — D509 Iron deficiency anemia, unspecified: Secondary | ICD-10-CM | POA: Diagnosis not present

## 2018-03-08 DIAGNOSIS — N186 End stage renal disease: Secondary | ICD-10-CM | POA: Diagnosis not present

## 2018-03-08 DIAGNOSIS — D509 Iron deficiency anemia, unspecified: Secondary | ICD-10-CM | POA: Diagnosis not present

## 2018-03-08 DIAGNOSIS — Z992 Dependence on renal dialysis: Secondary | ICD-10-CM | POA: Diagnosis not present

## 2018-03-08 DIAGNOSIS — E8779 Other fluid overload: Secondary | ICD-10-CM | POA: Diagnosis not present

## 2018-03-08 DIAGNOSIS — N2581 Secondary hyperparathyroidism of renal origin: Secondary | ICD-10-CM | POA: Diagnosis not present

## 2018-03-11 DIAGNOSIS — Z992 Dependence on renal dialysis: Secondary | ICD-10-CM | POA: Diagnosis not present

## 2018-03-11 DIAGNOSIS — N2581 Secondary hyperparathyroidism of renal origin: Secondary | ICD-10-CM | POA: Diagnosis not present

## 2018-03-11 DIAGNOSIS — D509 Iron deficiency anemia, unspecified: Secondary | ICD-10-CM | POA: Diagnosis not present

## 2018-03-11 DIAGNOSIS — E8779 Other fluid overload: Secondary | ICD-10-CM | POA: Diagnosis not present

## 2018-03-11 DIAGNOSIS — N186 End stage renal disease: Secondary | ICD-10-CM | POA: Diagnosis not present

## 2018-03-13 DIAGNOSIS — Z992 Dependence on renal dialysis: Secondary | ICD-10-CM | POA: Diagnosis not present

## 2018-03-13 DIAGNOSIS — N2581 Secondary hyperparathyroidism of renal origin: Secondary | ICD-10-CM | POA: Diagnosis not present

## 2018-03-13 DIAGNOSIS — D509 Iron deficiency anemia, unspecified: Secondary | ICD-10-CM | POA: Diagnosis not present

## 2018-03-13 DIAGNOSIS — E8779 Other fluid overload: Secondary | ICD-10-CM | POA: Diagnosis not present

## 2018-03-13 DIAGNOSIS — N186 End stage renal disease: Secondary | ICD-10-CM | POA: Diagnosis not present

## 2018-03-15 DIAGNOSIS — N186 End stage renal disease: Secondary | ICD-10-CM | POA: Diagnosis not present

## 2018-03-15 DIAGNOSIS — N2581 Secondary hyperparathyroidism of renal origin: Secondary | ICD-10-CM | POA: Diagnosis not present

## 2018-03-15 DIAGNOSIS — Z992 Dependence on renal dialysis: Secondary | ICD-10-CM | POA: Diagnosis not present

## 2018-03-15 DIAGNOSIS — D509 Iron deficiency anemia, unspecified: Secondary | ICD-10-CM | POA: Diagnosis not present

## 2018-03-15 DIAGNOSIS — E8779 Other fluid overload: Secondary | ICD-10-CM | POA: Diagnosis not present

## 2018-03-18 DIAGNOSIS — N2581 Secondary hyperparathyroidism of renal origin: Secondary | ICD-10-CM | POA: Diagnosis not present

## 2018-03-18 DIAGNOSIS — Z992 Dependence on renal dialysis: Secondary | ICD-10-CM | POA: Diagnosis not present

## 2018-03-18 DIAGNOSIS — N186 End stage renal disease: Secondary | ICD-10-CM | POA: Diagnosis not present

## 2018-03-18 DIAGNOSIS — D509 Iron deficiency anemia, unspecified: Secondary | ICD-10-CM | POA: Diagnosis not present

## 2018-03-18 DIAGNOSIS — E8779 Other fluid overload: Secondary | ICD-10-CM | POA: Diagnosis not present

## 2018-03-20 DIAGNOSIS — N2581 Secondary hyperparathyroidism of renal origin: Secondary | ICD-10-CM | POA: Diagnosis not present

## 2018-03-20 DIAGNOSIS — N186 End stage renal disease: Secondary | ICD-10-CM | POA: Diagnosis not present

## 2018-03-20 DIAGNOSIS — D509 Iron deficiency anemia, unspecified: Secondary | ICD-10-CM | POA: Diagnosis not present

## 2018-03-20 DIAGNOSIS — E8779 Other fluid overload: Secondary | ICD-10-CM | POA: Diagnosis not present

## 2018-03-20 DIAGNOSIS — Z992 Dependence on renal dialysis: Secondary | ICD-10-CM | POA: Diagnosis not present

## 2018-03-22 DIAGNOSIS — D509 Iron deficiency anemia, unspecified: Secondary | ICD-10-CM | POA: Diagnosis not present

## 2018-03-22 DIAGNOSIS — Z992 Dependence on renal dialysis: Secondary | ICD-10-CM | POA: Diagnosis not present

## 2018-03-22 DIAGNOSIS — N2581 Secondary hyperparathyroidism of renal origin: Secondary | ICD-10-CM | POA: Diagnosis not present

## 2018-03-22 DIAGNOSIS — E8779 Other fluid overload: Secondary | ICD-10-CM | POA: Diagnosis not present

## 2018-03-22 DIAGNOSIS — N186 End stage renal disease: Secondary | ICD-10-CM | POA: Diagnosis not present

## 2018-03-25 DIAGNOSIS — D509 Iron deficiency anemia, unspecified: Secondary | ICD-10-CM | POA: Diagnosis not present

## 2018-03-25 DIAGNOSIS — N186 End stage renal disease: Secondary | ICD-10-CM | POA: Diagnosis not present

## 2018-03-25 DIAGNOSIS — E8779 Other fluid overload: Secondary | ICD-10-CM | POA: Diagnosis not present

## 2018-03-25 DIAGNOSIS — N2581 Secondary hyperparathyroidism of renal origin: Secondary | ICD-10-CM | POA: Diagnosis not present

## 2018-03-25 DIAGNOSIS — Z992 Dependence on renal dialysis: Secondary | ICD-10-CM | POA: Diagnosis not present

## 2018-03-27 DIAGNOSIS — D509 Iron deficiency anemia, unspecified: Secondary | ICD-10-CM | POA: Diagnosis not present

## 2018-03-27 DIAGNOSIS — N186 End stage renal disease: Secondary | ICD-10-CM | POA: Diagnosis not present

## 2018-03-27 DIAGNOSIS — Z992 Dependence on renal dialysis: Secondary | ICD-10-CM | POA: Diagnosis not present

## 2018-03-27 DIAGNOSIS — E8779 Other fluid overload: Secondary | ICD-10-CM | POA: Diagnosis not present

## 2018-03-27 DIAGNOSIS — N2581 Secondary hyperparathyroidism of renal origin: Secondary | ICD-10-CM | POA: Diagnosis not present

## 2018-03-28 DIAGNOSIS — I120 Hypertensive chronic kidney disease with stage 5 chronic kidney disease or end stage renal disease: Secondary | ICD-10-CM | POA: Diagnosis not present

## 2018-03-28 DIAGNOSIS — N186 End stage renal disease: Secondary | ICD-10-CM | POA: Diagnosis not present

## 2018-03-28 DIAGNOSIS — Z992 Dependence on renal dialysis: Secondary | ICD-10-CM | POA: Diagnosis not present

## 2018-03-29 DIAGNOSIS — N186 End stage renal disease: Secondary | ICD-10-CM | POA: Diagnosis not present

## 2018-03-29 DIAGNOSIS — D509 Iron deficiency anemia, unspecified: Secondary | ICD-10-CM | POA: Diagnosis not present

## 2018-03-29 DIAGNOSIS — N2581 Secondary hyperparathyroidism of renal origin: Secondary | ICD-10-CM | POA: Diagnosis not present

## 2018-03-29 DIAGNOSIS — Z992 Dependence on renal dialysis: Secondary | ICD-10-CM | POA: Diagnosis not present

## 2018-04-01 DIAGNOSIS — N186 End stage renal disease: Secondary | ICD-10-CM | POA: Diagnosis not present

## 2018-04-01 DIAGNOSIS — Z992 Dependence on renal dialysis: Secondary | ICD-10-CM | POA: Diagnosis not present

## 2018-04-01 DIAGNOSIS — D509 Iron deficiency anemia, unspecified: Secondary | ICD-10-CM | POA: Diagnosis not present

## 2018-04-01 DIAGNOSIS — N2581 Secondary hyperparathyroidism of renal origin: Secondary | ICD-10-CM | POA: Diagnosis not present

## 2018-04-03 DIAGNOSIS — Z992 Dependence on renal dialysis: Secondary | ICD-10-CM | POA: Diagnosis not present

## 2018-04-03 DIAGNOSIS — N2581 Secondary hyperparathyroidism of renal origin: Secondary | ICD-10-CM | POA: Diagnosis not present

## 2018-04-03 DIAGNOSIS — N186 End stage renal disease: Secondary | ICD-10-CM | POA: Diagnosis not present

## 2018-04-03 DIAGNOSIS — D509 Iron deficiency anemia, unspecified: Secondary | ICD-10-CM | POA: Diagnosis not present

## 2018-04-05 DIAGNOSIS — Z992 Dependence on renal dialysis: Secondary | ICD-10-CM | POA: Diagnosis not present

## 2018-04-05 DIAGNOSIS — N186 End stage renal disease: Secondary | ICD-10-CM | POA: Diagnosis not present

## 2018-04-05 DIAGNOSIS — N2581 Secondary hyperparathyroidism of renal origin: Secondary | ICD-10-CM | POA: Diagnosis not present

## 2018-04-05 DIAGNOSIS — D509 Iron deficiency anemia, unspecified: Secondary | ICD-10-CM | POA: Diagnosis not present

## 2018-04-08 DIAGNOSIS — N2581 Secondary hyperparathyroidism of renal origin: Secondary | ICD-10-CM | POA: Diagnosis not present

## 2018-04-08 DIAGNOSIS — Z992 Dependence on renal dialysis: Secondary | ICD-10-CM | POA: Diagnosis not present

## 2018-04-08 DIAGNOSIS — N186 End stage renal disease: Secondary | ICD-10-CM | POA: Diagnosis not present

## 2018-04-08 DIAGNOSIS — D509 Iron deficiency anemia, unspecified: Secondary | ICD-10-CM | POA: Diagnosis not present

## 2018-04-10 DIAGNOSIS — D509 Iron deficiency anemia, unspecified: Secondary | ICD-10-CM | POA: Diagnosis not present

## 2018-04-10 DIAGNOSIS — Z992 Dependence on renal dialysis: Secondary | ICD-10-CM | POA: Diagnosis not present

## 2018-04-10 DIAGNOSIS — N186 End stage renal disease: Secondary | ICD-10-CM | POA: Diagnosis not present

## 2018-04-10 DIAGNOSIS — N2581 Secondary hyperparathyroidism of renal origin: Secondary | ICD-10-CM | POA: Diagnosis not present

## 2018-04-12 DIAGNOSIS — D509 Iron deficiency anemia, unspecified: Secondary | ICD-10-CM | POA: Diagnosis not present

## 2018-04-12 DIAGNOSIS — N186 End stage renal disease: Secondary | ICD-10-CM | POA: Diagnosis not present

## 2018-04-12 DIAGNOSIS — N2581 Secondary hyperparathyroidism of renal origin: Secondary | ICD-10-CM | POA: Diagnosis not present

## 2018-04-12 DIAGNOSIS — Z992 Dependence on renal dialysis: Secondary | ICD-10-CM | POA: Diagnosis not present

## 2018-04-15 DIAGNOSIS — N186 End stage renal disease: Secondary | ICD-10-CM | POA: Diagnosis not present

## 2018-04-15 DIAGNOSIS — Z992 Dependence on renal dialysis: Secondary | ICD-10-CM | POA: Diagnosis not present

## 2018-04-15 DIAGNOSIS — N2581 Secondary hyperparathyroidism of renal origin: Secondary | ICD-10-CM | POA: Diagnosis not present

## 2018-04-15 DIAGNOSIS — D509 Iron deficiency anemia, unspecified: Secondary | ICD-10-CM | POA: Diagnosis not present

## 2018-04-17 DIAGNOSIS — Z992 Dependence on renal dialysis: Secondary | ICD-10-CM | POA: Diagnosis not present

## 2018-04-17 DIAGNOSIS — N186 End stage renal disease: Secondary | ICD-10-CM | POA: Diagnosis not present

## 2018-04-17 DIAGNOSIS — N2581 Secondary hyperparathyroidism of renal origin: Secondary | ICD-10-CM | POA: Diagnosis not present

## 2018-04-17 DIAGNOSIS — D509 Iron deficiency anemia, unspecified: Secondary | ICD-10-CM | POA: Diagnosis not present

## 2018-04-19 DIAGNOSIS — D509 Iron deficiency anemia, unspecified: Secondary | ICD-10-CM | POA: Diagnosis not present

## 2018-04-19 DIAGNOSIS — Z992 Dependence on renal dialysis: Secondary | ICD-10-CM | POA: Diagnosis not present

## 2018-04-19 DIAGNOSIS — N186 End stage renal disease: Secondary | ICD-10-CM | POA: Diagnosis not present

## 2018-04-19 DIAGNOSIS — N2581 Secondary hyperparathyroidism of renal origin: Secondary | ICD-10-CM | POA: Diagnosis not present

## 2018-04-22 DIAGNOSIS — D509 Iron deficiency anemia, unspecified: Secondary | ICD-10-CM | POA: Diagnosis not present

## 2018-04-22 DIAGNOSIS — Z992 Dependence on renal dialysis: Secondary | ICD-10-CM | POA: Diagnosis not present

## 2018-04-22 DIAGNOSIS — N2581 Secondary hyperparathyroidism of renal origin: Secondary | ICD-10-CM | POA: Diagnosis not present

## 2018-04-22 DIAGNOSIS — N186 End stage renal disease: Secondary | ICD-10-CM | POA: Diagnosis not present

## 2018-04-24 DIAGNOSIS — N2581 Secondary hyperparathyroidism of renal origin: Secondary | ICD-10-CM | POA: Diagnosis not present

## 2018-04-24 DIAGNOSIS — Z992 Dependence on renal dialysis: Secondary | ICD-10-CM | POA: Diagnosis not present

## 2018-04-24 DIAGNOSIS — D509 Iron deficiency anemia, unspecified: Secondary | ICD-10-CM | POA: Diagnosis not present

## 2018-04-24 DIAGNOSIS — N186 End stage renal disease: Secondary | ICD-10-CM | POA: Diagnosis not present

## 2018-04-26 DIAGNOSIS — Z992 Dependence on renal dialysis: Secondary | ICD-10-CM | POA: Diagnosis not present

## 2018-04-26 DIAGNOSIS — N186 End stage renal disease: Secondary | ICD-10-CM | POA: Diagnosis not present

## 2018-04-26 DIAGNOSIS — D509 Iron deficiency anemia, unspecified: Secondary | ICD-10-CM | POA: Diagnosis not present

## 2018-04-26 DIAGNOSIS — N2581 Secondary hyperparathyroidism of renal origin: Secondary | ICD-10-CM | POA: Diagnosis not present

## 2018-04-28 DIAGNOSIS — Z992 Dependence on renal dialysis: Secondary | ICD-10-CM | POA: Diagnosis not present

## 2018-04-28 DIAGNOSIS — N186 End stage renal disease: Secondary | ICD-10-CM | POA: Diagnosis not present

## 2018-04-28 DIAGNOSIS — I120 Hypertensive chronic kidney disease with stage 5 chronic kidney disease or end stage renal disease: Secondary | ICD-10-CM | POA: Diagnosis not present

## 2018-04-29 DIAGNOSIS — N186 End stage renal disease: Secondary | ICD-10-CM | POA: Diagnosis not present

## 2018-04-29 DIAGNOSIS — Z992 Dependence on renal dialysis: Secondary | ICD-10-CM | POA: Diagnosis not present

## 2018-04-29 DIAGNOSIS — Z23 Encounter for immunization: Secondary | ICD-10-CM | POA: Diagnosis not present

## 2018-04-29 DIAGNOSIS — N2581 Secondary hyperparathyroidism of renal origin: Secondary | ICD-10-CM | POA: Diagnosis not present

## 2018-05-01 DIAGNOSIS — N2581 Secondary hyperparathyroidism of renal origin: Secondary | ICD-10-CM | POA: Diagnosis not present

## 2018-05-01 DIAGNOSIS — N186 End stage renal disease: Secondary | ICD-10-CM | POA: Diagnosis not present

## 2018-05-01 DIAGNOSIS — Z992 Dependence on renal dialysis: Secondary | ICD-10-CM | POA: Diagnosis not present

## 2018-05-01 DIAGNOSIS — Z23 Encounter for immunization: Secondary | ICD-10-CM | POA: Diagnosis not present

## 2018-05-03 DIAGNOSIS — Z992 Dependence on renal dialysis: Secondary | ICD-10-CM | POA: Diagnosis not present

## 2018-05-03 DIAGNOSIS — Z23 Encounter for immunization: Secondary | ICD-10-CM | POA: Diagnosis not present

## 2018-05-03 DIAGNOSIS — N2581 Secondary hyperparathyroidism of renal origin: Secondary | ICD-10-CM | POA: Diagnosis not present

## 2018-05-03 DIAGNOSIS — N186 End stage renal disease: Secondary | ICD-10-CM | POA: Diagnosis not present

## 2018-05-06 DIAGNOSIS — Z992 Dependence on renal dialysis: Secondary | ICD-10-CM | POA: Diagnosis not present

## 2018-05-06 DIAGNOSIS — N2581 Secondary hyperparathyroidism of renal origin: Secondary | ICD-10-CM | POA: Diagnosis not present

## 2018-05-06 DIAGNOSIS — N186 End stage renal disease: Secondary | ICD-10-CM | POA: Diagnosis not present

## 2018-05-06 DIAGNOSIS — Z23 Encounter for immunization: Secondary | ICD-10-CM | POA: Diagnosis not present

## 2018-05-08 DIAGNOSIS — N2581 Secondary hyperparathyroidism of renal origin: Secondary | ICD-10-CM | POA: Diagnosis not present

## 2018-05-08 DIAGNOSIS — Z23 Encounter for immunization: Secondary | ICD-10-CM | POA: Diagnosis not present

## 2018-05-08 DIAGNOSIS — Z992 Dependence on renal dialysis: Secondary | ICD-10-CM | POA: Diagnosis not present

## 2018-05-08 DIAGNOSIS — N186 End stage renal disease: Secondary | ICD-10-CM | POA: Diagnosis not present

## 2018-05-10 DIAGNOSIS — N2581 Secondary hyperparathyroidism of renal origin: Secondary | ICD-10-CM | POA: Diagnosis not present

## 2018-05-10 DIAGNOSIS — Z23 Encounter for immunization: Secondary | ICD-10-CM | POA: Diagnosis not present

## 2018-05-10 DIAGNOSIS — N186 End stage renal disease: Secondary | ICD-10-CM | POA: Diagnosis not present

## 2018-05-10 DIAGNOSIS — Z992 Dependence on renal dialysis: Secondary | ICD-10-CM | POA: Diagnosis not present

## 2018-05-13 DIAGNOSIS — N2581 Secondary hyperparathyroidism of renal origin: Secondary | ICD-10-CM | POA: Diagnosis not present

## 2018-05-13 DIAGNOSIS — Z992 Dependence on renal dialysis: Secondary | ICD-10-CM | POA: Diagnosis not present

## 2018-05-13 DIAGNOSIS — Z23 Encounter for immunization: Secondary | ICD-10-CM | POA: Diagnosis not present

## 2018-05-13 DIAGNOSIS — N186 End stage renal disease: Secondary | ICD-10-CM | POA: Diagnosis not present

## 2018-05-15 DIAGNOSIS — Z23 Encounter for immunization: Secondary | ICD-10-CM | POA: Diagnosis not present

## 2018-05-15 DIAGNOSIS — Z992 Dependence on renal dialysis: Secondary | ICD-10-CM | POA: Diagnosis not present

## 2018-05-15 DIAGNOSIS — N186 End stage renal disease: Secondary | ICD-10-CM | POA: Diagnosis not present

## 2018-05-15 DIAGNOSIS — N2581 Secondary hyperparathyroidism of renal origin: Secondary | ICD-10-CM | POA: Diagnosis not present

## 2018-05-17 DIAGNOSIS — Z992 Dependence on renal dialysis: Secondary | ICD-10-CM | POA: Diagnosis not present

## 2018-05-17 DIAGNOSIS — Z23 Encounter for immunization: Secondary | ICD-10-CM | POA: Diagnosis not present

## 2018-05-17 DIAGNOSIS — N2581 Secondary hyperparathyroidism of renal origin: Secondary | ICD-10-CM | POA: Diagnosis not present

## 2018-05-17 DIAGNOSIS — N186 End stage renal disease: Secondary | ICD-10-CM | POA: Diagnosis not present

## 2018-05-20 DIAGNOSIS — Z23 Encounter for immunization: Secondary | ICD-10-CM | POA: Diagnosis not present

## 2018-05-20 DIAGNOSIS — N186 End stage renal disease: Secondary | ICD-10-CM | POA: Diagnosis not present

## 2018-05-20 DIAGNOSIS — N2581 Secondary hyperparathyroidism of renal origin: Secondary | ICD-10-CM | POA: Diagnosis not present

## 2018-05-20 DIAGNOSIS — Z992 Dependence on renal dialysis: Secondary | ICD-10-CM | POA: Diagnosis not present

## 2018-05-22 DIAGNOSIS — N2581 Secondary hyperparathyroidism of renal origin: Secondary | ICD-10-CM | POA: Diagnosis not present

## 2018-05-22 DIAGNOSIS — Z23 Encounter for immunization: Secondary | ICD-10-CM | POA: Diagnosis not present

## 2018-05-22 DIAGNOSIS — Z992 Dependence on renal dialysis: Secondary | ICD-10-CM | POA: Diagnosis not present

## 2018-05-22 DIAGNOSIS — N186 End stage renal disease: Secondary | ICD-10-CM | POA: Diagnosis not present

## 2018-05-24 DIAGNOSIS — Z992 Dependence on renal dialysis: Secondary | ICD-10-CM | POA: Diagnosis not present

## 2018-05-24 DIAGNOSIS — N186 End stage renal disease: Secondary | ICD-10-CM | POA: Diagnosis not present

## 2018-05-24 DIAGNOSIS — N2581 Secondary hyperparathyroidism of renal origin: Secondary | ICD-10-CM | POA: Diagnosis not present

## 2018-05-24 DIAGNOSIS — Z23 Encounter for immunization: Secondary | ICD-10-CM | POA: Diagnosis not present

## 2018-05-27 DIAGNOSIS — N186 End stage renal disease: Secondary | ICD-10-CM | POA: Diagnosis not present

## 2018-05-27 DIAGNOSIS — Z992 Dependence on renal dialysis: Secondary | ICD-10-CM | POA: Diagnosis not present

## 2018-05-27 DIAGNOSIS — N2581 Secondary hyperparathyroidism of renal origin: Secondary | ICD-10-CM | POA: Diagnosis not present

## 2018-05-27 DIAGNOSIS — Z23 Encounter for immunization: Secondary | ICD-10-CM | POA: Diagnosis not present

## 2018-05-28 DIAGNOSIS — N186 End stage renal disease: Secondary | ICD-10-CM | POA: Diagnosis not present

## 2018-05-28 DIAGNOSIS — I120 Hypertensive chronic kidney disease with stage 5 chronic kidney disease or end stage renal disease: Secondary | ICD-10-CM | POA: Diagnosis not present

## 2018-05-28 DIAGNOSIS — Z992 Dependence on renal dialysis: Secondary | ICD-10-CM | POA: Diagnosis not present

## 2018-05-29 DIAGNOSIS — Z23 Encounter for immunization: Secondary | ICD-10-CM | POA: Diagnosis not present

## 2018-05-29 DIAGNOSIS — D509 Iron deficiency anemia, unspecified: Secondary | ICD-10-CM | POA: Diagnosis not present

## 2018-05-29 DIAGNOSIS — N186 End stage renal disease: Secondary | ICD-10-CM | POA: Diagnosis not present

## 2018-05-29 DIAGNOSIS — E8779 Other fluid overload: Secondary | ICD-10-CM | POA: Diagnosis not present

## 2018-05-29 DIAGNOSIS — N2581 Secondary hyperparathyroidism of renal origin: Secondary | ICD-10-CM | POA: Diagnosis not present

## 2018-05-29 DIAGNOSIS — Z992 Dependence on renal dialysis: Secondary | ICD-10-CM | POA: Diagnosis not present

## 2018-05-31 DIAGNOSIS — Z992 Dependence on renal dialysis: Secondary | ICD-10-CM | POA: Diagnosis not present

## 2018-05-31 DIAGNOSIS — D509 Iron deficiency anemia, unspecified: Secondary | ICD-10-CM | POA: Diagnosis not present

## 2018-05-31 DIAGNOSIS — E8779 Other fluid overload: Secondary | ICD-10-CM | POA: Diagnosis not present

## 2018-05-31 DIAGNOSIS — N186 End stage renal disease: Secondary | ICD-10-CM | POA: Diagnosis not present

## 2018-05-31 DIAGNOSIS — Z23 Encounter for immunization: Secondary | ICD-10-CM | POA: Diagnosis not present

## 2018-05-31 DIAGNOSIS — N2581 Secondary hyperparathyroidism of renal origin: Secondary | ICD-10-CM | POA: Diagnosis not present

## 2018-06-03 DIAGNOSIS — N186 End stage renal disease: Secondary | ICD-10-CM | POA: Diagnosis not present

## 2018-06-03 DIAGNOSIS — Z992 Dependence on renal dialysis: Secondary | ICD-10-CM | POA: Diagnosis not present

## 2018-06-03 DIAGNOSIS — D509 Iron deficiency anemia, unspecified: Secondary | ICD-10-CM | POA: Diagnosis not present

## 2018-06-03 DIAGNOSIS — E8779 Other fluid overload: Secondary | ICD-10-CM | POA: Diagnosis not present

## 2018-06-03 DIAGNOSIS — N2581 Secondary hyperparathyroidism of renal origin: Secondary | ICD-10-CM | POA: Diagnosis not present

## 2018-06-03 DIAGNOSIS — Z23 Encounter for immunization: Secondary | ICD-10-CM | POA: Diagnosis not present

## 2018-06-05 DIAGNOSIS — D509 Iron deficiency anemia, unspecified: Secondary | ICD-10-CM | POA: Diagnosis not present

## 2018-06-05 DIAGNOSIS — E8779 Other fluid overload: Secondary | ICD-10-CM | POA: Diagnosis not present

## 2018-06-05 DIAGNOSIS — Z992 Dependence on renal dialysis: Secondary | ICD-10-CM | POA: Diagnosis not present

## 2018-06-05 DIAGNOSIS — N186 End stage renal disease: Secondary | ICD-10-CM | POA: Diagnosis not present

## 2018-06-05 DIAGNOSIS — Z23 Encounter for immunization: Secondary | ICD-10-CM | POA: Diagnosis not present

## 2018-06-05 DIAGNOSIS — N2581 Secondary hyperparathyroidism of renal origin: Secondary | ICD-10-CM | POA: Diagnosis not present

## 2018-06-06 DIAGNOSIS — Z6827 Body mass index (BMI) 27.0-27.9, adult: Secondary | ICD-10-CM | POA: Diagnosis not present

## 2018-06-06 DIAGNOSIS — I10 Essential (primary) hypertension: Secondary | ICD-10-CM | POA: Diagnosis not present

## 2018-06-06 DIAGNOSIS — M545 Low back pain: Secondary | ICD-10-CM | POA: Diagnosis not present

## 2018-06-07 DIAGNOSIS — E8779 Other fluid overload: Secondary | ICD-10-CM | POA: Diagnosis not present

## 2018-06-07 DIAGNOSIS — N2581 Secondary hyperparathyroidism of renal origin: Secondary | ICD-10-CM | POA: Diagnosis not present

## 2018-06-07 DIAGNOSIS — N186 End stage renal disease: Secondary | ICD-10-CM | POA: Diagnosis not present

## 2018-06-07 DIAGNOSIS — D509 Iron deficiency anemia, unspecified: Secondary | ICD-10-CM | POA: Diagnosis not present

## 2018-06-07 DIAGNOSIS — Z992 Dependence on renal dialysis: Secondary | ICD-10-CM | POA: Diagnosis not present

## 2018-06-07 DIAGNOSIS — Z23 Encounter for immunization: Secondary | ICD-10-CM | POA: Diagnosis not present

## 2018-06-10 DIAGNOSIS — N2581 Secondary hyperparathyroidism of renal origin: Secondary | ICD-10-CM | POA: Diagnosis not present

## 2018-06-10 DIAGNOSIS — N186 End stage renal disease: Secondary | ICD-10-CM | POA: Diagnosis not present

## 2018-06-10 DIAGNOSIS — E8779 Other fluid overload: Secondary | ICD-10-CM | POA: Diagnosis not present

## 2018-06-10 DIAGNOSIS — Z23 Encounter for immunization: Secondary | ICD-10-CM | POA: Diagnosis not present

## 2018-06-10 DIAGNOSIS — Z992 Dependence on renal dialysis: Secondary | ICD-10-CM | POA: Diagnosis not present

## 2018-06-10 DIAGNOSIS — D509 Iron deficiency anemia, unspecified: Secondary | ICD-10-CM | POA: Diagnosis not present

## 2018-06-12 DIAGNOSIS — N2581 Secondary hyperparathyroidism of renal origin: Secondary | ICD-10-CM | POA: Diagnosis not present

## 2018-06-12 DIAGNOSIS — D509 Iron deficiency anemia, unspecified: Secondary | ICD-10-CM | POA: Diagnosis not present

## 2018-06-12 DIAGNOSIS — Z992 Dependence on renal dialysis: Secondary | ICD-10-CM | POA: Diagnosis not present

## 2018-06-12 DIAGNOSIS — E8779 Other fluid overload: Secondary | ICD-10-CM | POA: Diagnosis not present

## 2018-06-12 DIAGNOSIS — N186 End stage renal disease: Secondary | ICD-10-CM | POA: Diagnosis not present

## 2018-06-12 DIAGNOSIS — Z23 Encounter for immunization: Secondary | ICD-10-CM | POA: Diagnosis not present

## 2018-06-13 DIAGNOSIS — N186 End stage renal disease: Secondary | ICD-10-CM | POA: Diagnosis not present

## 2018-06-13 DIAGNOSIS — N2581 Secondary hyperparathyroidism of renal origin: Secondary | ICD-10-CM | POA: Diagnosis not present

## 2018-06-13 DIAGNOSIS — Z992 Dependence on renal dialysis: Secondary | ICD-10-CM | POA: Diagnosis not present

## 2018-06-13 DIAGNOSIS — E8779 Other fluid overload: Secondary | ICD-10-CM | POA: Diagnosis not present

## 2018-06-13 DIAGNOSIS — Z23 Encounter for immunization: Secondary | ICD-10-CM | POA: Diagnosis not present

## 2018-06-13 DIAGNOSIS — D509 Iron deficiency anemia, unspecified: Secondary | ICD-10-CM | POA: Diagnosis not present

## 2018-06-14 DIAGNOSIS — E8779 Other fluid overload: Secondary | ICD-10-CM | POA: Diagnosis not present

## 2018-06-14 DIAGNOSIS — Z23 Encounter for immunization: Secondary | ICD-10-CM | POA: Diagnosis not present

## 2018-06-14 DIAGNOSIS — D509 Iron deficiency anemia, unspecified: Secondary | ICD-10-CM | POA: Diagnosis not present

## 2018-06-14 DIAGNOSIS — N2581 Secondary hyperparathyroidism of renal origin: Secondary | ICD-10-CM | POA: Diagnosis not present

## 2018-06-14 DIAGNOSIS — N186 End stage renal disease: Secondary | ICD-10-CM | POA: Diagnosis not present

## 2018-06-14 DIAGNOSIS — Z992 Dependence on renal dialysis: Secondary | ICD-10-CM | POA: Diagnosis not present

## 2018-06-17 DIAGNOSIS — Z23 Encounter for immunization: Secondary | ICD-10-CM | POA: Diagnosis not present

## 2018-06-17 DIAGNOSIS — E8779 Other fluid overload: Secondary | ICD-10-CM | POA: Diagnosis not present

## 2018-06-17 DIAGNOSIS — Z992 Dependence on renal dialysis: Secondary | ICD-10-CM | POA: Diagnosis not present

## 2018-06-17 DIAGNOSIS — N2581 Secondary hyperparathyroidism of renal origin: Secondary | ICD-10-CM | POA: Diagnosis not present

## 2018-06-17 DIAGNOSIS — N186 End stage renal disease: Secondary | ICD-10-CM | POA: Diagnosis not present

## 2018-06-17 DIAGNOSIS — D509 Iron deficiency anemia, unspecified: Secondary | ICD-10-CM | POA: Diagnosis not present

## 2018-06-19 DIAGNOSIS — D509 Iron deficiency anemia, unspecified: Secondary | ICD-10-CM | POA: Diagnosis not present

## 2018-06-19 DIAGNOSIS — N2581 Secondary hyperparathyroidism of renal origin: Secondary | ICD-10-CM | POA: Diagnosis not present

## 2018-06-19 DIAGNOSIS — Z992 Dependence on renal dialysis: Secondary | ICD-10-CM | POA: Diagnosis not present

## 2018-06-19 DIAGNOSIS — Z23 Encounter for immunization: Secondary | ICD-10-CM | POA: Diagnosis not present

## 2018-06-19 DIAGNOSIS — N186 End stage renal disease: Secondary | ICD-10-CM | POA: Diagnosis not present

## 2018-06-19 DIAGNOSIS — E8779 Other fluid overload: Secondary | ICD-10-CM | POA: Diagnosis not present

## 2018-06-21 DIAGNOSIS — E8779 Other fluid overload: Secondary | ICD-10-CM | POA: Diagnosis not present

## 2018-06-21 DIAGNOSIS — Z992 Dependence on renal dialysis: Secondary | ICD-10-CM | POA: Diagnosis not present

## 2018-06-21 DIAGNOSIS — Z23 Encounter for immunization: Secondary | ICD-10-CM | POA: Diagnosis not present

## 2018-06-21 DIAGNOSIS — N2581 Secondary hyperparathyroidism of renal origin: Secondary | ICD-10-CM | POA: Diagnosis not present

## 2018-06-21 DIAGNOSIS — D509 Iron deficiency anemia, unspecified: Secondary | ICD-10-CM | POA: Diagnosis not present

## 2018-06-21 DIAGNOSIS — N186 End stage renal disease: Secondary | ICD-10-CM | POA: Diagnosis not present

## 2018-06-24 DIAGNOSIS — E8779 Other fluid overload: Secondary | ICD-10-CM | POA: Diagnosis not present

## 2018-06-24 DIAGNOSIS — Z992 Dependence on renal dialysis: Secondary | ICD-10-CM | POA: Diagnosis not present

## 2018-06-24 DIAGNOSIS — N2581 Secondary hyperparathyroidism of renal origin: Secondary | ICD-10-CM | POA: Diagnosis not present

## 2018-06-24 DIAGNOSIS — Z23 Encounter for immunization: Secondary | ICD-10-CM | POA: Diagnosis not present

## 2018-06-24 DIAGNOSIS — N186 End stage renal disease: Secondary | ICD-10-CM | POA: Diagnosis not present

## 2018-06-24 DIAGNOSIS — D509 Iron deficiency anemia, unspecified: Secondary | ICD-10-CM | POA: Diagnosis not present

## 2018-06-26 DIAGNOSIS — D509 Iron deficiency anemia, unspecified: Secondary | ICD-10-CM | POA: Diagnosis not present

## 2018-06-26 DIAGNOSIS — N2581 Secondary hyperparathyroidism of renal origin: Secondary | ICD-10-CM | POA: Diagnosis not present

## 2018-06-26 DIAGNOSIS — E8779 Other fluid overload: Secondary | ICD-10-CM | POA: Diagnosis not present

## 2018-06-26 DIAGNOSIS — N186 End stage renal disease: Secondary | ICD-10-CM | POA: Diagnosis not present

## 2018-06-26 DIAGNOSIS — Z992 Dependence on renal dialysis: Secondary | ICD-10-CM | POA: Diagnosis not present

## 2018-06-26 DIAGNOSIS — Z23 Encounter for immunization: Secondary | ICD-10-CM | POA: Diagnosis not present

## 2018-06-28 DIAGNOSIS — N2581 Secondary hyperparathyroidism of renal origin: Secondary | ICD-10-CM | POA: Diagnosis not present

## 2018-06-28 DIAGNOSIS — Z992 Dependence on renal dialysis: Secondary | ICD-10-CM | POA: Diagnosis not present

## 2018-06-28 DIAGNOSIS — I120 Hypertensive chronic kidney disease with stage 5 chronic kidney disease or end stage renal disease: Secondary | ICD-10-CM | POA: Diagnosis not present

## 2018-06-28 DIAGNOSIS — N186 End stage renal disease: Secondary | ICD-10-CM | POA: Diagnosis not present

## 2018-06-28 DIAGNOSIS — D509 Iron deficiency anemia, unspecified: Secondary | ICD-10-CM | POA: Diagnosis not present

## 2018-07-01 DIAGNOSIS — N2581 Secondary hyperparathyroidism of renal origin: Secondary | ICD-10-CM | POA: Diagnosis not present

## 2018-07-01 DIAGNOSIS — Z992 Dependence on renal dialysis: Secondary | ICD-10-CM | POA: Diagnosis not present

## 2018-07-01 DIAGNOSIS — D509 Iron deficiency anemia, unspecified: Secondary | ICD-10-CM | POA: Diagnosis not present

## 2018-07-01 DIAGNOSIS — N186 End stage renal disease: Secondary | ICD-10-CM | POA: Diagnosis not present

## 2018-07-03 DIAGNOSIS — D509 Iron deficiency anemia, unspecified: Secondary | ICD-10-CM | POA: Diagnosis not present

## 2018-07-03 DIAGNOSIS — N186 End stage renal disease: Secondary | ICD-10-CM | POA: Diagnosis not present

## 2018-07-03 DIAGNOSIS — Z992 Dependence on renal dialysis: Secondary | ICD-10-CM | POA: Diagnosis not present

## 2018-07-03 DIAGNOSIS — N2581 Secondary hyperparathyroidism of renal origin: Secondary | ICD-10-CM | POA: Diagnosis not present

## 2018-07-05 DIAGNOSIS — Z992 Dependence on renal dialysis: Secondary | ICD-10-CM | POA: Diagnosis not present

## 2018-07-05 DIAGNOSIS — N2581 Secondary hyperparathyroidism of renal origin: Secondary | ICD-10-CM | POA: Diagnosis not present

## 2018-07-05 DIAGNOSIS — D509 Iron deficiency anemia, unspecified: Secondary | ICD-10-CM | POA: Diagnosis not present

## 2018-07-05 DIAGNOSIS — N186 End stage renal disease: Secondary | ICD-10-CM | POA: Diagnosis not present

## 2018-07-08 DIAGNOSIS — D509 Iron deficiency anemia, unspecified: Secondary | ICD-10-CM | POA: Diagnosis not present

## 2018-07-08 DIAGNOSIS — N186 End stage renal disease: Secondary | ICD-10-CM | POA: Diagnosis not present

## 2018-07-08 DIAGNOSIS — N2581 Secondary hyperparathyroidism of renal origin: Secondary | ICD-10-CM | POA: Diagnosis not present

## 2018-07-08 DIAGNOSIS — Z992 Dependence on renal dialysis: Secondary | ICD-10-CM | POA: Diagnosis not present

## 2018-07-10 DIAGNOSIS — N186 End stage renal disease: Secondary | ICD-10-CM | POA: Diagnosis not present

## 2018-07-10 DIAGNOSIS — D509 Iron deficiency anemia, unspecified: Secondary | ICD-10-CM | POA: Diagnosis not present

## 2018-07-10 DIAGNOSIS — Z992 Dependence on renal dialysis: Secondary | ICD-10-CM | POA: Diagnosis not present

## 2018-07-10 DIAGNOSIS — N2581 Secondary hyperparathyroidism of renal origin: Secondary | ICD-10-CM | POA: Diagnosis not present

## 2018-07-12 DIAGNOSIS — N2581 Secondary hyperparathyroidism of renal origin: Secondary | ICD-10-CM | POA: Diagnosis not present

## 2018-07-12 DIAGNOSIS — N186 End stage renal disease: Secondary | ICD-10-CM | POA: Diagnosis not present

## 2018-07-12 DIAGNOSIS — Z992 Dependence on renal dialysis: Secondary | ICD-10-CM | POA: Diagnosis not present

## 2018-07-12 DIAGNOSIS — D509 Iron deficiency anemia, unspecified: Secondary | ICD-10-CM | POA: Diagnosis not present

## 2018-07-15 DIAGNOSIS — D509 Iron deficiency anemia, unspecified: Secondary | ICD-10-CM | POA: Diagnosis not present

## 2018-07-15 DIAGNOSIS — N186 End stage renal disease: Secondary | ICD-10-CM | POA: Diagnosis not present

## 2018-07-15 DIAGNOSIS — N2581 Secondary hyperparathyroidism of renal origin: Secondary | ICD-10-CM | POA: Diagnosis not present

## 2018-07-15 DIAGNOSIS — Z992 Dependence on renal dialysis: Secondary | ICD-10-CM | POA: Diagnosis not present

## 2018-07-17 DIAGNOSIS — N186 End stage renal disease: Secondary | ICD-10-CM | POA: Diagnosis not present

## 2018-07-17 DIAGNOSIS — N2581 Secondary hyperparathyroidism of renal origin: Secondary | ICD-10-CM | POA: Diagnosis not present

## 2018-07-17 DIAGNOSIS — D509 Iron deficiency anemia, unspecified: Secondary | ICD-10-CM | POA: Diagnosis not present

## 2018-07-17 DIAGNOSIS — Z992 Dependence on renal dialysis: Secondary | ICD-10-CM | POA: Diagnosis not present

## 2018-07-19 DIAGNOSIS — N186 End stage renal disease: Secondary | ICD-10-CM | POA: Diagnosis not present

## 2018-07-19 DIAGNOSIS — N2581 Secondary hyperparathyroidism of renal origin: Secondary | ICD-10-CM | POA: Diagnosis not present

## 2018-07-19 DIAGNOSIS — D509 Iron deficiency anemia, unspecified: Secondary | ICD-10-CM | POA: Diagnosis not present

## 2018-07-19 DIAGNOSIS — Z992 Dependence on renal dialysis: Secondary | ICD-10-CM | POA: Diagnosis not present

## 2018-07-21 DIAGNOSIS — N2581 Secondary hyperparathyroidism of renal origin: Secondary | ICD-10-CM | POA: Diagnosis not present

## 2018-07-21 DIAGNOSIS — D509 Iron deficiency anemia, unspecified: Secondary | ICD-10-CM | POA: Diagnosis not present

## 2018-07-21 DIAGNOSIS — Z992 Dependence on renal dialysis: Secondary | ICD-10-CM | POA: Diagnosis not present

## 2018-07-21 DIAGNOSIS — N186 End stage renal disease: Secondary | ICD-10-CM | POA: Diagnosis not present

## 2018-07-23 DIAGNOSIS — N2581 Secondary hyperparathyroidism of renal origin: Secondary | ICD-10-CM | POA: Diagnosis not present

## 2018-07-23 DIAGNOSIS — Z992 Dependence on renal dialysis: Secondary | ICD-10-CM | POA: Diagnosis not present

## 2018-07-23 DIAGNOSIS — N186 End stage renal disease: Secondary | ICD-10-CM | POA: Diagnosis not present

## 2018-07-23 DIAGNOSIS — D509 Iron deficiency anemia, unspecified: Secondary | ICD-10-CM | POA: Diagnosis not present

## 2018-07-26 DIAGNOSIS — Z992 Dependence on renal dialysis: Secondary | ICD-10-CM | POA: Diagnosis not present

## 2018-07-26 DIAGNOSIS — N2581 Secondary hyperparathyroidism of renal origin: Secondary | ICD-10-CM | POA: Diagnosis not present

## 2018-07-26 DIAGNOSIS — N186 End stage renal disease: Secondary | ICD-10-CM | POA: Diagnosis not present

## 2018-07-26 DIAGNOSIS — D509 Iron deficiency anemia, unspecified: Secondary | ICD-10-CM | POA: Diagnosis not present

## 2018-07-28 DIAGNOSIS — Z992 Dependence on renal dialysis: Secondary | ICD-10-CM | POA: Diagnosis not present

## 2018-07-28 DIAGNOSIS — I120 Hypertensive chronic kidney disease with stage 5 chronic kidney disease or end stage renal disease: Secondary | ICD-10-CM | POA: Diagnosis not present

## 2018-07-28 DIAGNOSIS — N186 End stage renal disease: Secondary | ICD-10-CM | POA: Diagnosis not present

## 2018-07-29 DIAGNOSIS — Z992 Dependence on renal dialysis: Secondary | ICD-10-CM | POA: Diagnosis not present

## 2018-07-29 DIAGNOSIS — D631 Anemia in chronic kidney disease: Secondary | ICD-10-CM | POA: Diagnosis not present

## 2018-07-29 DIAGNOSIS — N186 End stage renal disease: Secondary | ICD-10-CM | POA: Diagnosis not present

## 2018-07-29 DIAGNOSIS — N2581 Secondary hyperparathyroidism of renal origin: Secondary | ICD-10-CM | POA: Diagnosis not present

## 2018-07-29 DIAGNOSIS — D509 Iron deficiency anemia, unspecified: Secondary | ICD-10-CM | POA: Diagnosis not present

## 2018-07-31 DIAGNOSIS — N2581 Secondary hyperparathyroidism of renal origin: Secondary | ICD-10-CM | POA: Diagnosis not present

## 2018-07-31 DIAGNOSIS — Z992 Dependence on renal dialysis: Secondary | ICD-10-CM | POA: Diagnosis not present

## 2018-07-31 DIAGNOSIS — N186 End stage renal disease: Secondary | ICD-10-CM | POA: Diagnosis not present

## 2018-07-31 DIAGNOSIS — D631 Anemia in chronic kidney disease: Secondary | ICD-10-CM | POA: Diagnosis not present

## 2018-07-31 DIAGNOSIS — D509 Iron deficiency anemia, unspecified: Secondary | ICD-10-CM | POA: Diagnosis not present

## 2018-08-02 DIAGNOSIS — D631 Anemia in chronic kidney disease: Secondary | ICD-10-CM | POA: Diagnosis not present

## 2018-08-02 DIAGNOSIS — N2581 Secondary hyperparathyroidism of renal origin: Secondary | ICD-10-CM | POA: Diagnosis not present

## 2018-08-02 DIAGNOSIS — N186 End stage renal disease: Secondary | ICD-10-CM | POA: Diagnosis not present

## 2018-08-02 DIAGNOSIS — Z992 Dependence on renal dialysis: Secondary | ICD-10-CM | POA: Diagnosis not present

## 2018-08-02 DIAGNOSIS — D509 Iron deficiency anemia, unspecified: Secondary | ICD-10-CM | POA: Diagnosis not present

## 2018-08-05 DIAGNOSIS — N2581 Secondary hyperparathyroidism of renal origin: Secondary | ICD-10-CM | POA: Diagnosis not present

## 2018-08-05 DIAGNOSIS — D631 Anemia in chronic kidney disease: Secondary | ICD-10-CM | POA: Diagnosis not present

## 2018-08-05 DIAGNOSIS — D509 Iron deficiency anemia, unspecified: Secondary | ICD-10-CM | POA: Diagnosis not present

## 2018-08-05 DIAGNOSIS — N186 End stage renal disease: Secondary | ICD-10-CM | POA: Diagnosis not present

## 2018-08-05 DIAGNOSIS — Z992 Dependence on renal dialysis: Secondary | ICD-10-CM | POA: Diagnosis not present

## 2018-08-07 DIAGNOSIS — N186 End stage renal disease: Secondary | ICD-10-CM | POA: Diagnosis not present

## 2018-08-07 DIAGNOSIS — N2581 Secondary hyperparathyroidism of renal origin: Secondary | ICD-10-CM | POA: Diagnosis not present

## 2018-08-07 DIAGNOSIS — D509 Iron deficiency anemia, unspecified: Secondary | ICD-10-CM | POA: Diagnosis not present

## 2018-08-07 DIAGNOSIS — Z992 Dependence on renal dialysis: Secondary | ICD-10-CM | POA: Diagnosis not present

## 2018-08-07 DIAGNOSIS — D631 Anemia in chronic kidney disease: Secondary | ICD-10-CM | POA: Diagnosis not present

## 2018-08-09 DIAGNOSIS — Z992 Dependence on renal dialysis: Secondary | ICD-10-CM | POA: Diagnosis not present

## 2018-08-09 DIAGNOSIS — N2581 Secondary hyperparathyroidism of renal origin: Secondary | ICD-10-CM | POA: Diagnosis not present

## 2018-08-09 DIAGNOSIS — D631 Anemia in chronic kidney disease: Secondary | ICD-10-CM | POA: Diagnosis not present

## 2018-08-09 DIAGNOSIS — D509 Iron deficiency anemia, unspecified: Secondary | ICD-10-CM | POA: Diagnosis not present

## 2018-08-09 DIAGNOSIS — N186 End stage renal disease: Secondary | ICD-10-CM | POA: Diagnosis not present

## 2018-08-12 DIAGNOSIS — D509 Iron deficiency anemia, unspecified: Secondary | ICD-10-CM | POA: Diagnosis not present

## 2018-08-12 DIAGNOSIS — Z992 Dependence on renal dialysis: Secondary | ICD-10-CM | POA: Diagnosis not present

## 2018-08-12 DIAGNOSIS — D631 Anemia in chronic kidney disease: Secondary | ICD-10-CM | POA: Diagnosis not present

## 2018-08-12 DIAGNOSIS — N186 End stage renal disease: Secondary | ICD-10-CM | POA: Diagnosis not present

## 2018-08-12 DIAGNOSIS — N2581 Secondary hyperparathyroidism of renal origin: Secondary | ICD-10-CM | POA: Diagnosis not present

## 2018-08-14 DIAGNOSIS — D631 Anemia in chronic kidney disease: Secondary | ICD-10-CM | POA: Diagnosis not present

## 2018-08-14 DIAGNOSIS — N186 End stage renal disease: Secondary | ICD-10-CM | POA: Diagnosis not present

## 2018-08-14 DIAGNOSIS — Z992 Dependence on renal dialysis: Secondary | ICD-10-CM | POA: Diagnosis not present

## 2018-08-14 DIAGNOSIS — D509 Iron deficiency anemia, unspecified: Secondary | ICD-10-CM | POA: Diagnosis not present

## 2018-08-14 DIAGNOSIS — N2581 Secondary hyperparathyroidism of renal origin: Secondary | ICD-10-CM | POA: Diagnosis not present

## 2018-08-15 DIAGNOSIS — Z992 Dependence on renal dialysis: Secondary | ICD-10-CM | POA: Diagnosis not present

## 2018-08-15 DIAGNOSIS — N186 End stage renal disease: Secondary | ICD-10-CM | POA: Diagnosis not present

## 2018-08-15 DIAGNOSIS — I871 Compression of vein: Secondary | ICD-10-CM | POA: Diagnosis not present

## 2018-08-15 DIAGNOSIS — T82858A Stenosis of vascular prosthetic devices, implants and grafts, initial encounter: Secondary | ICD-10-CM | POA: Diagnosis not present

## 2018-08-16 DIAGNOSIS — D509 Iron deficiency anemia, unspecified: Secondary | ICD-10-CM | POA: Diagnosis not present

## 2018-08-16 DIAGNOSIS — N186 End stage renal disease: Secondary | ICD-10-CM | POA: Diagnosis not present

## 2018-08-16 DIAGNOSIS — N2581 Secondary hyperparathyroidism of renal origin: Secondary | ICD-10-CM | POA: Diagnosis not present

## 2018-08-16 DIAGNOSIS — Z992 Dependence on renal dialysis: Secondary | ICD-10-CM | POA: Diagnosis not present

## 2018-08-16 DIAGNOSIS — D631 Anemia in chronic kidney disease: Secondary | ICD-10-CM | POA: Diagnosis not present

## 2018-08-18 DIAGNOSIS — D509 Iron deficiency anemia, unspecified: Secondary | ICD-10-CM | POA: Diagnosis not present

## 2018-08-18 DIAGNOSIS — N186 End stage renal disease: Secondary | ICD-10-CM | POA: Diagnosis not present

## 2018-08-18 DIAGNOSIS — N2581 Secondary hyperparathyroidism of renal origin: Secondary | ICD-10-CM | POA: Diagnosis not present

## 2018-08-18 DIAGNOSIS — D631 Anemia in chronic kidney disease: Secondary | ICD-10-CM | POA: Diagnosis not present

## 2018-08-18 DIAGNOSIS — Z992 Dependence on renal dialysis: Secondary | ICD-10-CM | POA: Diagnosis not present

## 2018-08-19 DIAGNOSIS — Z6827 Body mass index (BMI) 27.0-27.9, adult: Secondary | ICD-10-CM | POA: Diagnosis not present

## 2018-08-19 DIAGNOSIS — R2231 Localized swelling, mass and lump, right upper limb: Secondary | ICD-10-CM | POA: Diagnosis not present

## 2018-08-20 DIAGNOSIS — Z992 Dependence on renal dialysis: Secondary | ICD-10-CM | POA: Diagnosis not present

## 2018-08-20 DIAGNOSIS — D509 Iron deficiency anemia, unspecified: Secondary | ICD-10-CM | POA: Diagnosis not present

## 2018-08-20 DIAGNOSIS — D631 Anemia in chronic kidney disease: Secondary | ICD-10-CM | POA: Diagnosis not present

## 2018-08-20 DIAGNOSIS — N2581 Secondary hyperparathyroidism of renal origin: Secondary | ICD-10-CM | POA: Diagnosis not present

## 2018-08-20 DIAGNOSIS — N186 End stage renal disease: Secondary | ICD-10-CM | POA: Diagnosis not present

## 2018-08-23 DIAGNOSIS — D509 Iron deficiency anemia, unspecified: Secondary | ICD-10-CM | POA: Diagnosis not present

## 2018-08-23 DIAGNOSIS — N2581 Secondary hyperparathyroidism of renal origin: Secondary | ICD-10-CM | POA: Diagnosis not present

## 2018-08-23 DIAGNOSIS — D631 Anemia in chronic kidney disease: Secondary | ICD-10-CM | POA: Diagnosis not present

## 2018-08-23 DIAGNOSIS — N186 End stage renal disease: Secondary | ICD-10-CM | POA: Diagnosis not present

## 2018-08-23 DIAGNOSIS — Z992 Dependence on renal dialysis: Secondary | ICD-10-CM | POA: Diagnosis not present

## 2018-08-25 DIAGNOSIS — N186 End stage renal disease: Secondary | ICD-10-CM | POA: Diagnosis not present

## 2018-08-25 DIAGNOSIS — Z992 Dependence on renal dialysis: Secondary | ICD-10-CM | POA: Diagnosis not present

## 2018-08-25 DIAGNOSIS — N2581 Secondary hyperparathyroidism of renal origin: Secondary | ICD-10-CM | POA: Diagnosis not present

## 2018-08-25 DIAGNOSIS — D631 Anemia in chronic kidney disease: Secondary | ICD-10-CM | POA: Diagnosis not present

## 2018-08-25 DIAGNOSIS — D509 Iron deficiency anemia, unspecified: Secondary | ICD-10-CM | POA: Diagnosis not present

## 2018-08-27 DIAGNOSIS — D631 Anemia in chronic kidney disease: Secondary | ICD-10-CM | POA: Diagnosis not present

## 2018-08-27 DIAGNOSIS — N186 End stage renal disease: Secondary | ICD-10-CM | POA: Diagnosis not present

## 2018-08-27 DIAGNOSIS — D509 Iron deficiency anemia, unspecified: Secondary | ICD-10-CM | POA: Diagnosis not present

## 2018-08-27 DIAGNOSIS — N2581 Secondary hyperparathyroidism of renal origin: Secondary | ICD-10-CM | POA: Diagnosis not present

## 2018-08-27 DIAGNOSIS — Z992 Dependence on renal dialysis: Secondary | ICD-10-CM | POA: Diagnosis not present

## 2018-08-28 DIAGNOSIS — Z992 Dependence on renal dialysis: Secondary | ICD-10-CM | POA: Diagnosis not present

## 2018-08-28 DIAGNOSIS — N186 End stage renal disease: Secondary | ICD-10-CM | POA: Diagnosis not present

## 2018-08-28 DIAGNOSIS — I120 Hypertensive chronic kidney disease with stage 5 chronic kidney disease or end stage renal disease: Secondary | ICD-10-CM | POA: Diagnosis not present

## 2018-08-30 DIAGNOSIS — E875 Hyperkalemia: Secondary | ICD-10-CM | POA: Diagnosis not present

## 2018-08-30 DIAGNOSIS — D631 Anemia in chronic kidney disease: Secondary | ICD-10-CM | POA: Diagnosis not present

## 2018-08-30 DIAGNOSIS — N186 End stage renal disease: Secondary | ICD-10-CM | POA: Diagnosis not present

## 2018-08-30 DIAGNOSIS — N2581 Secondary hyperparathyroidism of renal origin: Secondary | ICD-10-CM | POA: Diagnosis not present

## 2018-08-30 DIAGNOSIS — D509 Iron deficiency anemia, unspecified: Secondary | ICD-10-CM | POA: Diagnosis not present

## 2018-08-30 DIAGNOSIS — I120 Hypertensive chronic kidney disease with stage 5 chronic kidney disease or end stage renal disease: Secondary | ICD-10-CM | POA: Diagnosis not present

## 2018-08-30 DIAGNOSIS — Z992 Dependence on renal dialysis: Secondary | ICD-10-CM | POA: Diagnosis not present

## 2018-09-02 DIAGNOSIS — N186 End stage renal disease: Secondary | ICD-10-CM | POA: Diagnosis not present

## 2018-09-02 DIAGNOSIS — D509 Iron deficiency anemia, unspecified: Secondary | ICD-10-CM | POA: Diagnosis not present

## 2018-09-02 DIAGNOSIS — I120 Hypertensive chronic kidney disease with stage 5 chronic kidney disease or end stage renal disease: Secondary | ICD-10-CM | POA: Diagnosis not present

## 2018-09-02 DIAGNOSIS — N2581 Secondary hyperparathyroidism of renal origin: Secondary | ICD-10-CM | POA: Diagnosis not present

## 2018-09-02 DIAGNOSIS — D631 Anemia in chronic kidney disease: Secondary | ICD-10-CM | POA: Diagnosis not present

## 2018-09-02 DIAGNOSIS — E875 Hyperkalemia: Secondary | ICD-10-CM | POA: Diagnosis not present

## 2018-09-04 DIAGNOSIS — N186 End stage renal disease: Secondary | ICD-10-CM | POA: Diagnosis not present

## 2018-09-04 DIAGNOSIS — E875 Hyperkalemia: Secondary | ICD-10-CM | POA: Diagnosis not present

## 2018-09-04 DIAGNOSIS — I120 Hypertensive chronic kidney disease with stage 5 chronic kidney disease or end stage renal disease: Secondary | ICD-10-CM | POA: Diagnosis not present

## 2018-09-04 DIAGNOSIS — D631 Anemia in chronic kidney disease: Secondary | ICD-10-CM | POA: Diagnosis not present

## 2018-09-04 DIAGNOSIS — N2581 Secondary hyperparathyroidism of renal origin: Secondary | ICD-10-CM | POA: Diagnosis not present

## 2018-09-04 DIAGNOSIS — D509 Iron deficiency anemia, unspecified: Secondary | ICD-10-CM | POA: Diagnosis not present

## 2018-09-05 ENCOUNTER — Other Ambulatory Visit: Payer: Self-pay

## 2018-09-05 ENCOUNTER — Encounter (HOSPITAL_COMMUNITY): Payer: Self-pay | Admitting: *Deleted

## 2018-09-05 ENCOUNTER — Other Ambulatory Visit: Payer: Self-pay | Admitting: Orthopedic Surgery

## 2018-09-05 DIAGNOSIS — L988 Other specified disorders of the skin and subcutaneous tissue: Secondary | ICD-10-CM | POA: Diagnosis not present

## 2018-09-05 NOTE — Progress Notes (Signed)
Marco Arnold denies chest pain or shortness of breath. Patient is in process of kidney transplant work up, stress test 07/18/2018 was abnormal.  Patient is scheduled for cath 09/19/2018.  Dr Clovia Cuff , cardiologist with [facility] sent cardiac clearance- it is on the chart.

## 2018-09-06 DIAGNOSIS — D631 Anemia in chronic kidney disease: Secondary | ICD-10-CM | POA: Diagnosis not present

## 2018-09-06 DIAGNOSIS — N2581 Secondary hyperparathyroidism of renal origin: Secondary | ICD-10-CM | POA: Diagnosis not present

## 2018-09-06 DIAGNOSIS — I120 Hypertensive chronic kidney disease with stage 5 chronic kidney disease or end stage renal disease: Secondary | ICD-10-CM | POA: Diagnosis not present

## 2018-09-06 DIAGNOSIS — E875 Hyperkalemia: Secondary | ICD-10-CM | POA: Diagnosis not present

## 2018-09-06 DIAGNOSIS — N186 End stage renal disease: Secondary | ICD-10-CM | POA: Diagnosis not present

## 2018-09-06 DIAGNOSIS — D509 Iron deficiency anemia, unspecified: Secondary | ICD-10-CM | POA: Diagnosis not present

## 2018-09-06 MED ORDER — DEXTROSE 5 % IV SOLN
3.0000 g | INTRAVENOUS | Status: DC
  Filled 2018-09-06: qty 3000

## 2018-09-06 NOTE — Progress Notes (Signed)
Anesthesia Chart Review: SAME DAY WORKUP   Case:  [id number] Date/Time:  09/07/18 0715   Procedure:  EXCISION MASS RIGHT THUMB (Right ) - OR AXILLARY BLOCK   Anesthesia type:  General   Pre-op diagnosis:  RIGHT THUMB MASS   Location:  [place] OR ROOM 03 / [place] OR   Surgeon:  Charlotte Crumb, MD      DISCUSSION: 53 yo male current smoker. Pertinent hx includes HTN, ESRD on HD, Neruopathic pain left mid thoracic.  Pt had recent cardiac eval as part of pre kidney transplant workup. Per cardiology notes by Dr. Curly Rim at [facility]: "The patient is asymptomatic for angina.  No clinical evidence of CHF or arrhythmia.  He has HCVD.  We should stay the course with his current medical regimen.  If nuclear stress is positive for coronary ischemia he will need cath to access coronary status and plan therapy: Meds versus PCI versus CABG.  On the other hand, if negative-we may be able to clear him from CV standpoint for renal transplant."  Pt had TTE on 06/04/18 showing EF 45-50%, mildly dilated LV, mild global hypokinesis, RV normal size and function.  Nuclear stress on 07/18/18 showed diffuse left ventricular dilation.  Severe fixed perfusion defect at the inferior wall consistent with transmural scar.  No inducible transmural perfusion defect.  Akinesis of the apex, significant hypokinesis of the lateral wall, and mild hypokinesis of the remaining walls.  Ejection fraction: 34%.  Based on nuclear stress results the pt is scheduled for a cath on 09/19/18. Dr. Clovia Cuff did however clear the pt for surgery by Dr. Burney Gauze. Clearance dated 09/05/18 on pt chart.  Anticipate he can proceed as planned barring acute status change.   VS: There were no vitals taken for this visit.  PROVIDERS: Cyndi Bender, PA-C is PCP  Curly Rim, MD is Cardiologist  Donato Heinz, MD is Nephrologist  LABS: Will need DOS labs  Labs Reviewed - No data to display   IMAGES: XR CHEST PA AND LATERAL, 07/09/2018    INDICATION:pre transplant eval \ Z01.818 Pre-transplant evaluation for kidney transplant  COMPARISON: Chest radiograph 07/19/2016; contemporaneous CT abdomen and pelvis  FINDINGS:  Cardiovascular/Mediastinum: The cardiomediastinal silhouette is within normal limits. Lungs/pleura: No focal airspace opacity or edema. No pneumothorax. No pleural effusion.   Upper abdomen: Within normal limits.   Chest wall/osseous structures: No acute osseous abnormality. Degenerative changes of the thoracic spine.   EKG: 09/03/2017 (copy on pt chart): Sinus rhythm. Rate 81. Possible Left atrial enlargement. RBBB. LAFB. LVH with secondary repolarization abnormality. Cannot rule out Septal infarct , age undetermined. No significant change from previous.  PULM: PFT 07/18/2018 (care everywhere): Component Name Value Ref Range  FVC Pre-Actual 5.50 L  FVC %Pred-Pre 96.1 %  FVC LLN 4.45 L  FEV1 Pre-Actual 3.79 L  FEV1 %Pred-Pre 85.3 %  FEV1 LLN 3.44 L  FEV1FVC Pre-Actual 69 %  FEV1FVC LLN 67 %  SVC Pre-Actual 5.61 L  SVC %Pred-Pre 101.0 %  RVPLETH %Pred-Pre 162.2 %  TLCPLETH Pre-Actual 9.38 L  TLCPLETH %Pred-Pre 119.0 %  TLCPLETH LLN 6.29 L  DLCOUNC Pre-Actual 26.51 ml/min/mmHg  DLCOUNC %Pred-Pre 79.8 %  DLCOUNC LLN 25.27 ml/min/mmHg  DLVA %Pred-Pre 69.1 %  FEV1SVC Pre-Actual 68 %    CV:  Nuclear Stress 07/18/2018 (care everywhere): 1.) LIMITATIONS: None. 2.) MYOCARDIAL PERFUSION: Severe fixed perfusion defect at the inferior wall consistent with a transmural scar. No inducible transmural perfusion defect elsewhere. 3.) LEFT VENTRICULAR EJECTION FRACTION: Decreased, measured at  34% on the stress images. 4.) REGIONAL WALL MOTION: Akinesis at the apex, significant hypokinesis of the lateral wall, and mild hypokinesis of the remaining walls on both the gated rest and stress images.  TTE 06/04/2018 (care everywhere): SUMMARY The left ventricle is mildly dilated. There is mild concentric left  ventricular hypertrophy. LV ejection fraction = 45-50%.  Left ventricular systolic function is mildly reduced. There is mild global hypokinesis of the left ventricle. The right ventricle is normal in size and function. The left atrium is mildly dilated.  Right atrial size is normal. There is aortic valve sclerosis. There is trivial pericardial effusion. Compared to images of prior exam of 03/11/16 LV function has declined.  Past Medical History:  Diagnosis Date  . Arthritis    spine  . Chronic kidney disease    [place] MWF  . Dialysis patient [facility]) 2013  . Diverticulitis   . Hypertension    dr  Tobie Lords      @ [facility]  med  . Neuropathic pain 09/20/2017   Left mid-thoracic    Past Surgical History:  Procedure Laterality Date  . AV FISTULA PLACEMENT  11/10/2011   Procedure: ARTERIOVENOUS (AV) FISTULA CREATION;  Surgeon: Mal Misty, MD;  Location: [place];  Service: Vascular;  Laterality: Left;  Ultrasound guided  . COLONOSCOPY    . INSERTION OF DIALYSIS CATHETER  02/09/2012   Procedure: INSERTION OF DIALYSIS CATHETER;  Surgeon: Mal Misty, MD;  Location: [place];  Service: Vascular;  Laterality: N/A;  . SHUNTOGRAM Left 06/03/2012   Procedure: Earney Mallet;  Surgeon: Conrad [place], MD;  Location: [facility] CATH LAB;  Service: Cardiovascular;  Laterality: Left;  . SHUNTOGRAM Left 01/02/2013   Procedure: SHUNTOGRAM;  Surgeon: Conrad [place], MD;  Location: [facility] CATH LAB;  Service: Cardiovascular;  Laterality: Left;    MEDICATIONS: . [START ON 09/07/2018] ceFAZolin (ANCEF) 3 g in dextrose 5 % 50 mL IVPB   . amLODipine (NORVASC) 5 MG tablet  . cloNIDine (CATAPRES) 0.1 MG tablet  . cyclobenzaprine (FLEXERIL) 10 MG tablet  . DULoxetine (CYMBALTA) 60 MG capsule  . FOSRENOL 1000 MG chewable tablet  . losartan (COZAAR) 100 MG tablet  . multivitamin (RENA-VIT) TABS tablet  . naproxen sodium (ALEVE) 220 MG tablet  . nortriptyline (PAMELOR) 25 MG capsule  . pantoprazole (PROTONIX) 40 MG  tablet  . sildenafil (VIAGRA) 25 MG tablet  . VENTOLIN HFA 108 (90 Base) MCG/ACT inhaler  . gabapentin (NEURONTIN) 100 MG capsule     Marco Arnold [facility] Short Stay Center/Anesthesiology Phone [phone] 09/06/2018 10:31 AM

## 2018-09-06 NOTE — Anesthesia Preprocedure Evaluation (Addendum)
Anesthesia Evaluation  Patient identified by MRN, date of birth, ID band Patient awake    Reviewed: Allergy & Precautions, NPO status , Patient's Chart, lab work & pertinent test results  History of Anesthesia Complications Negative for: history of anesthetic complications  Airway Mallampati: I       Dental  (+) Teeth Intact,    Pulmonary neg shortness of breath, neg sleep apnea, neg COPD, neg recent URI, Current Smoker,    breath sounds clear to auscultation       Cardiovascular hypertension, Pt. on medications +CHF   Rhythm:Regular     Neuro/Psych negative neurological ROS  negative psych ROS   GI/Hepatic negative GI ROS, Neg liver ROS,   Endo/Other  negative endocrine ROS  Renal/GU Dialysis and ESRFRenal disease     Musculoskeletal   Abdominal   Peds  Hematology  (+) anemia ,   Anesthesia Other Findings Heart eval by [facility] for kidney transplant list. Stress came back with EF ~35% and multiple areas of scaring indicating previous infarct, denies angina, set up for cath later in January for further evaluation. Has cardiac clearance (1/9) for hand surgery prior to cath.   Reproductive/Obstetrics                           Anesthesia Physical Anesthesia Plan  ASA: III  Anesthesia Plan: MAC and Regional   Post-op Pain Management:    Induction: Intravenous  PONV Risk Score and Plan: 0 and Propofol infusion and Treatment may vary due to age or medical condition  Airway Management Planned: Nasal Cannula  Additional Equipment: None  Intra-op Plan:   Post-operative Plan:   Informed Consent:   Dental advisory given  Plan Discussed with: CRNA and Surgeon  Anesthesia Plan Comments: (See PAT note by Karoline Caldwell, PA-C )       Anesthesia Quick Evaluation

## 2018-09-07 ENCOUNTER — Ambulatory Visit (HOSPITAL_COMMUNITY)
Admission: RE | Admit: 2018-09-07 | Discharge: 2018-09-07 | Disposition: A | Discharge status: Home / Self Care | Payer: Medicare Other | Attending: Orthopedic Surgery | Admitting: Orthopedic Surgery

## 2018-09-07 ENCOUNTER — Encounter (HOSPITAL_COMMUNITY)
Admission: RE | Disposition: A | Discharge status: Home / Self Care | Payer: Self-pay | Source: Home / Self Care | Attending: Orthopedic Surgery

## 2018-09-07 ENCOUNTER — Ambulatory Visit (HOSPITAL_COMMUNITY): Payer: Medicare Other | Admitting: Physician Assistant

## 2018-09-07 ENCOUNTER — Encounter (HOSPITAL_COMMUNITY): Payer: Self-pay | Admitting: *Deleted

## 2018-09-07 DIAGNOSIS — N186 End stage renal disease: Secondary | ICD-10-CM | POA: Diagnosis not present

## 2018-09-07 DIAGNOSIS — Z79899 Other long term (current) drug therapy: Secondary | ICD-10-CM | POA: Diagnosis not present

## 2018-09-07 DIAGNOSIS — L720 Epidermal cyst: Secondary | ICD-10-CM | POA: Insufficient documentation

## 2018-09-07 DIAGNOSIS — I120 Hypertensive chronic kidney disease with stage 5 chronic kidney disease or end stage renal disease: Secondary | ICD-10-CM | POA: Diagnosis not present

## 2018-09-07 DIAGNOSIS — M79644 Pain in right finger(s): Secondary | ICD-10-CM | POA: Diagnosis present

## 2018-09-07 DIAGNOSIS — R2231 Localized swelling, mass and lump, right upper limb: Secondary | ICD-10-CM | POA: Diagnosis not present

## 2018-09-07 DIAGNOSIS — Z992 Dependence on renal dialysis: Secondary | ICD-10-CM | POA: Diagnosis not present

## 2018-09-07 DIAGNOSIS — I129 Hypertensive chronic kidney disease with stage 1 through stage 4 chronic kidney disease, or unspecified chronic kidney disease: Secondary | ICD-10-CM | POA: Diagnosis not present

## 2018-09-07 DIAGNOSIS — N189 Chronic kidney disease, unspecified: Secondary | ICD-10-CM | POA: Insufficient documentation

## 2018-09-07 DIAGNOSIS — F17210 Nicotine dependence, cigarettes, uncomplicated: Secondary | ICD-10-CM | POA: Insufficient documentation

## 2018-09-07 DIAGNOSIS — M7989 Other specified soft tissue disorders: Secondary | ICD-10-CM | POA: Diagnosis not present

## 2018-09-07 DIAGNOSIS — D2111 Benign neoplasm of connective and other soft tissue of right upper limb, including shoulder: Secondary | ICD-10-CM | POA: Diagnosis not present

## 2018-09-07 HISTORY — DX: Diverticulitis of intestine, part unspecified, without perforation or abscess without bleeding: K57.92

## 2018-09-07 HISTORY — PX: MASS EXCISION: SHX2000

## 2018-09-07 HISTORY — DX: Unspecified osteoarthritis, unspecified site: M19.90

## 2018-09-07 LAB — POCT I-STAT 4, (NA,K, GLUC, HGB,HCT)
Glucose, Bld: 87 mg/dL (ref 70–99)
HCT: 34 % — ABNORMAL LOW (ref 39.0–52.0)
Hemoglobin: 11.6 g/dL — ABNORMAL LOW (ref 13.0–17.0)
Potassium: 5.3 mmol/L — ABNORMAL HIGH (ref 3.5–5.1)
Sodium: 135 mmol/L (ref 135–145)

## 2018-09-07 SURGERY — EXCISION MASS
Anesthesia: Monitor Anesthesia Care | Laterality: Right

## 2018-09-07 MED ORDER — FENTANYL CITRATE (PF) 100 MCG/2ML IJ SOLN
INTRAMUSCULAR | Status: DC | PRN
  Administered 2018-09-07: 25 ug via INTRAVENOUS
  Administered 2018-09-07: 50 ug via INTRAVENOUS

## 2018-09-07 MED ORDER — PROPOFOL 500 MG/50ML IV EMUL
INTRAVENOUS | Status: DC | PRN
  Administered 2018-09-07: 100 ug/kg/min via INTRAVENOUS

## 2018-09-07 MED ORDER — SODIUM CHLORIDE 0.9 % IV SOLN
INTRAVENOUS | Status: DC
  Administered 2018-09-07: 07:00:00 via INTRAVENOUS

## 2018-09-07 MED ORDER — LIDOCAINE-EPINEPHRINE (PF) 1.5 %-1:200000 IJ SOLN
INTRAMUSCULAR | Status: DC | PRN
  Administered 2018-09-07: 20 mL via PERINEURAL

## 2018-09-07 MED ORDER — PROPOFOL 10 MG/ML IV BOLUS
INTRAVENOUS | Status: AC
  Filled 2018-09-07: qty 20

## 2018-09-07 MED ORDER — CEFAZOLIN SODIUM-DEXTROSE 2-4 GM/100ML-% IV SOLN
INTRAVENOUS | Status: AC
  Filled 2018-09-07: qty 100

## 2018-09-07 MED ORDER — MIDAZOLAM HCL 2 MG/2ML IJ SOLN
INTRAMUSCULAR | Status: AC
  Filled 2018-09-07: qty 2

## 2018-09-07 MED ORDER — CHLORHEXIDINE GLUCONATE 4 % EX LIQD: 60.0000 mL | Freq: Once | CUTANEOUS | Status: DC

## 2018-09-07 MED ORDER — LIDOCAINE 2% (20 MG/ML) 5 ML SYRINGE
INTRAMUSCULAR | Status: DC | PRN
  Administered 2018-09-07: 20 mg via INTRAVENOUS

## 2018-09-07 MED ORDER — FENTANYL CITRATE (PF) 250 MCG/5ML IJ SOLN
INTRAMUSCULAR | Status: AC
  Filled 2018-09-07: qty 5

## 2018-09-07 MED ORDER — MEPIVACAINE HCL 1.5 % IJ SOLN
INTRAMUSCULAR | Status: DC | PRN
  Administered 2018-09-07: 10 mL via PERINEURAL

## 2018-09-07 MED ORDER — CEFAZOLIN SODIUM-DEXTROSE 2-4 GM/100ML-% IV SOLN
2.0000 g | INTRAVENOUS | Status: AC
  Administered 2018-09-07: 2 g via INTRAVENOUS

## 2018-09-07 MED ORDER — PROPOFOL 10 MG/ML IV BOLUS
INTRAVENOUS | Status: DC | PRN
  Administered 2018-09-07: 20 mg via INTRAVENOUS

## 2018-09-07 MED ORDER — BUPIVACAINE HCL (PF) 0.25 % IJ SOLN
INTRAMUSCULAR | Status: AC
  Filled 2018-09-07: qty 30

## 2018-09-07 MED ORDER — LIDOCAINE HCL (PF) 1 % IJ SOLN
INTRAMUSCULAR | Status: AC
  Filled 2018-09-07: qty 30

## 2018-09-07 MED ORDER — 0.9 % SODIUM CHLORIDE (POUR BTL) OPTIME
TOPICAL | Status: DC | PRN
  Administered 2018-09-07: 1000 mL

## 2018-09-07 SURGICAL SUPPLY — 29 items
BLADE SURG 15 STRL LF DISP TIS (BLADE) ×1 IMPLANT
BLADE SURG 15 STRL SS (BLADE) ×3
BNDG CMPR 9X4 STRL LF SNTH (GAUZE/BANDAGES/DRESSINGS) ×1
BNDG COHESIVE 1X5 TAN STRL LF (GAUZE/BANDAGES/DRESSINGS) ×3 IMPLANT
BNDG ELASTIC 2X5.8 VLCR STR LF (GAUZE/BANDAGES/DRESSINGS) ×3 IMPLANT
BNDG ESMARK 4X9 LF (GAUZE/BANDAGES/DRESSINGS) ×3 IMPLANT
CORDS BIPOLAR (ELECTRODE) ×3 IMPLANT
COVER BACK TABLE 60X90IN (DRAPES) ×3 IMPLANT
COVER WAND RF STERILE (DRAPES) ×3 IMPLANT
DRAPE SURG 17X23 STRL (DRAPES) ×3 IMPLANT
DURAPREP 26ML APPLICATOR (WOUND CARE) ×3 IMPLANT
GAUZE 4X4 16PLY RFD (DISPOSABLE) ×3 IMPLANT
GAUZE SPONGE 4X4 12PLY STRL (GAUZE/BANDAGES/DRESSINGS) ×3 IMPLANT
GAUZE XEROFORM 1X8 LF (GAUZE/BANDAGES/DRESSINGS) ×3 IMPLANT
GLOVE SURG SYN 8.0 (GLOVE) ×3 IMPLANT
GOWN STRL REUS W/ TWL LRG LVL3 (GOWN DISPOSABLE) ×1 IMPLANT
GOWN STRL REUS W/ TWL XL LVL3 (GOWN DISPOSABLE) ×1 IMPLANT
GOWN STRL REUS W/TWL LRG LVL3 (GOWN DISPOSABLE) ×3
GOWN STRL REUS W/TWL XL LVL3 (GOWN DISPOSABLE) ×3
KIT BASIN OR (CUSTOM PROCEDURE TRAY) ×3 IMPLANT
KIT TURNOVER KIT B (KITS) ×3 IMPLANT
NS IRRIG 1000ML POUR BTL (IV SOLUTION) ×3 IMPLANT
PAD ARMBOARD 7.5X6 YLW CONV (MISCELLANEOUS) ×3 IMPLANT
STOCKINETTE 4X48 STRL (DRAPES) ×3 IMPLANT
SUT ETHILON 4 0 P 3 18 (SUTURE) ×3 IMPLANT
SYR BULB 3OZ (MISCELLANEOUS) ×3 IMPLANT
TOWEL OR 17X24 6PK STRL BLUE (TOWEL DISPOSABLE) ×3 IMPLANT
UNDERPAD 30X30 (UNDERPADS AND DIAPERS) ×3 IMPLANT
WATER STERILE IRR 1000ML POUR (IV SOLUTION) ×3 IMPLANT

## 2018-09-07 NOTE — Progress Notes (Signed)
Dr. Burney Gauze notified due to dialysis access IV was placed in right [facility] while drawing blood.

## 2018-09-07 NOTE — H&P (Signed)
Marco Arnold is an 53 y.o. male.   Chief Complaint: Right thumb pain, swelling, and chronic draining mass HPI: Patient is a very pleasant 53 year old male with a history of a chronic mass on the palmar aspect of his dominant right thumb at the interphalangeal joint that is got more painful and swollen with intermittent drainage over the past several months.  Past Medical History:  Diagnosis Date  . Arthritis    spine  . Chronic kidney disease    [place] MWF  . Dialysis patient [facility]) 2013  . Diverticulitis   . Hypertension    dr  Tobie Lords      @ [facility]  med  . Neuropathic pain 09/20/2017   Left mid-thoracic    Past Surgical History:  Procedure Laterality Date  . AV FISTULA PLACEMENT  11/10/2011   Procedure: ARTERIOVENOUS (AV) FISTULA CREATION;  Surgeon: Mal Misty, MD;  Location: [place];  Service: Vascular;  Laterality: Left;  Ultrasound guided  . COLONOSCOPY    . INSERTION OF DIALYSIS CATHETER  02/09/2012   Procedure: INSERTION OF DIALYSIS CATHETER;  Surgeon: Mal Misty, MD;  Location: [place];  Service: Vascular;  Laterality: N/A;  . SHUNTOGRAM Left 06/03/2012   Procedure: Earney Mallet;  Surgeon: Conrad [place], MD;  Location: [facility] CATH LAB;  Service: Cardiovascular;  Laterality: Left;  . SHUNTOGRAM Left 01/02/2013   Procedure: SHUNTOGRAM;  Surgeon: Conrad [place], MD;  Location: [facility] CATH LAB;  Service: Cardiovascular;  Laterality: Left;    Family History  Problem Relation Age of Onset  . Pancreatic cancer Mother   . Diabetes Father   . Hypertension Father   . Cancer - Lung Paternal Grandfather    Social History:  reports that he has been smoking cigarettes. He has a 5.16 pack-year smoking history. He has never used smokeless tobacco. He reports current alcohol use. He reports current drug use. Drug: Marijuana.  Allergies:  Allergies  Allergen Reactions  . Lisinopril Other (See Comments)    Unable to breath    Medications Prior to Admission  Medication Sig Dispense  Refill  . amLODipine (NORVASC) 5 MG tablet Take 5 mg by mouth at bedtime.    . cephALEXin (KEFLEX) 500 MG capsule Take 500 mg by mouth daily.    . cloNIDine (CATAPRES) 0.1 MG tablet Take 0.2 mg by mouth daily.    . cyclobenzaprine (FLEXERIL) 10 MG tablet Take 20 mg by mouth 2 (two) times daily as needed for muscle spasms.     . DULoxetine (CYMBALTA) 60 MG capsule Take 60 mg by mouth daily.   2  . FOSRENOL 1000 MG chewable tablet Chew 4,000 mg by mouth 3 (three) times daily with meals.   11  . losartan (COZAAR) 100 MG tablet Take 100 mg by mouth at bedtime.     . multivitamin (RENA-VIT) TABS tablet Take 1 tablet by mouth every evening.    . naproxen sodium (ALEVE) 220 MG tablet Take 220 mg by mouth daily as needed (pain).     . nortriptyline (PAMELOR) 25 MG capsule Take 25 mg by mouth at bedtime.   2  . sildenafil (VIAGRA) 25 MG tablet Take 25 mg by mouth daily as needed for erectile dysfunction.    . gabapentin (NEURONTIN) 100 MG capsule Take 3 capsules (300 mg total) by mouth at bedtime. (Patient not taking: Reported on 09/05/2018) 60 capsule 0  . pantoprazole (PROTONIX) 40 MG tablet Take 40 mg by mouth as needed (heartburn).     Marland Kitchen  VENTOLIN HFA 108 (90 Base) MCG/ACT inhaler Inhale 2 puffs into the lungs every 6 (six) hours as needed for wheezing or shortness of breath.       Results for orders placed or performed during the hospital encounter of 09/07/18 (from the past 48 hour(s))  I-STAT 4, (NA,K, GLUC, HGB,HCT)     Status: Abnormal   Collection Time: 09/07/18  6:44 AM  Result Value Ref Range   Sodium 135 135 - 145 mmol/L   Potassium 5.3 (H) 3.5 - 5.1 mmol/L   Glucose, Bld 87 70 - 99 mg/dL   HCT 34.0 (L) 39.0 - 52.0 %   Hemoglobin 11.6 (L) 13.0 - 17.0 g/dL   No results found.  Review of Systems  All other systems reviewed and are negative.   Blood pressure (!) 151/87, pulse 74, temperature 98.3 F (36.8 C), temperature source Oral, resp. rate 16, height 6\' 3"  (1.905 m), weight 104.3  kg, SpO2 100 %. Physical Exam  Constitutional: He is oriented to person, place, and time. He appears well-developed and well-nourished.  HENT:  Head: Normocephalic and atraumatic.  Neck: Normal range of motion.  Cardiovascular: Normal rate.  Respiratory: Effort normal.  Musculoskeletal:     Right hand: He exhibits tenderness, deformity and swelling.     Comments: Right thumb palmar mass at interphalangeal joint with swelling and intermittent drainage  Neurological: He is alert and oriented to person, place, and time.  Skin: Skin is warm.  Psychiatric: He has a normal mood and affect. His behavior is normal. Judgment and thought content normal.     Assessment/Plan 53 year old male with chronic enlarging mass palmar aspect right thumb interphalangeal joint with worsening pain, swelling, and drainage.  Have discussed the role of excisional biopsy of this mass with culturing, pathology, and primary wound closure as an outpatient.  Patient understands risks and benefits and wishes to proceed  Schuyler Amor, MD 09/07/2018, 7:24 AM

## 2018-09-07 NOTE — Transfer of Care (Signed)
Immediate Anesthesia Transfer of Care Note  Patient: Marco Arnold  Procedure(s) Performed: EXCISION MASS RIGHT THUMB (Right )  Patient Location: PACU  Anesthesia Type:MAC  Level of Consciousness: awake, alert  and oriented  Airway & Oxygen Therapy: Patient Spontanous Breathing and Patient connected to nasal cannula oxygen  Post-op Assessment: Report given to RN and Post -op Vital signs reviewed and stable  Post vital signs: Reviewed and stable  Last Vitals:  Vitals Value Taken Time  BP    Temp    Pulse 74 09/07/2018  8:24 AM  Resp 17 09/07/2018  8:24 AM  SpO2 97 % 09/07/2018  8:24 AM  Vitals shown include unvalidated device data.  Last Pain:  Vitals:   09/07/18 0628  TempSrc: Oral  PainSc: 0-No pain      Patients Stated Pain Goal: 0 (39/03/00 9233)  Complications: No apparent anesthesia complications

## 2018-09-07 NOTE — Anesthesia Procedure Notes (Signed)
Procedure Name: MAC Date/Time: 09/07/2018 7:38 AM Performed by: Kyung Rudd, CRNA Pre-anesthesia Checklist: Patient identified, Emergency Drugs available, Suction available, Patient being monitored and Timeout performed Patient Re-evaluated:Patient Re-evaluated prior to induction Oxygen Delivery Method: Simple face mask Induction Type: IV induction Placement Confirmation: positive ETCO2

## 2018-09-07 NOTE — Op Note (Signed)
NAME: Marco Arnold, Marco Arnold. MEDICAL RECORD [id number] ACCOUNT [id number] DATE OF BIRTH:03/11/1966 FACILITY: [facility] LOCATION: [facility]-PERIOP PHYSICIAN:Hadlee Burback A. Burney Gauze, MD  OPERATIVE REPORT  DATE OF PROCEDURE:  09/07/2018  PREOPERATIVE DIAGNOSIS:  Enlarging painful mass palmar aspect, right thumb.  POSTOPERATIVE DIAGNOSIS:  Enlarging painful mass palmar aspect, right thumb.  PROCEDURE:  Excision of deep mass, right thumb.  SURGEON:  Charlotte Crumb, MD  ASSISTANT:  None.  ANESTHESIA:  Regional and sedation.  COMPLICATIONS:  No complications.  DRAINS:  No drains.  SPECIMENS:  One specimen sent.  DESCRIPTION OF PROCEDURE:  The patient was taken to the operating suite after induction of adequate regional anesthetic and IV sedation.  Right upper extremity was prepped and draped in the usual sterile fashion.  An Esmarch was used to exsanguinate the  limb and a forearm tourniquet was inflated to 200 mmHg.  At this point in time, a longitudinal incision was made over the palmar aspect of the thumb from the tip to the IP flexion crease.  Dissection was carried into a large multilobulated lesion coming  off the distal phalanx and flexor sheath consistent with a large epidermoid cyst.  It was carefully excised in its entirety and found to be bilobed.  It measured 3 x 3 cm.  This was sent for pathologic confirmation.  We irrigated the wound and loosely  closed it including excision of the necrotic skin edges to obtain an adequate wound closure.  This was done with 4-0 nylon.  The patient was then dressed with Xeroform, 4 x 4's, and a compression bandage.  The patient tolerated this procedure well and  went to recovery room in stable fashion.  TN/NUANCE  D:09/07/2018 T:09/07/2018 JOB:[id number]

## 2018-09-07 NOTE — Anesthesia Procedure Notes (Signed)
Anesthesia Regional Block: Axillary brachial plexus block   Pre-Anesthetic Checklist: ,, timeout performed, Correct Patient, Correct Site, Correct Laterality, Correct Procedure, Correct Position, site marked, Risks and benefits discussed,  Surgical consent,  Pre-op evaluation,  At surgeon's request and post-op pain management  Laterality: Right and Upper  Prep: chloraprep       Needles:  Injection technique: Single-shot     Needle Length: 9cm  Needle Gauge: 22     Additional Needles: Arrow StimuQuik ECHO Echogenic Stimulating PNB Needle  Procedures:,,,, ultrasound used (permanent image in chart),,,,  Narrative:  Start time: 09/07/2018 8:15 AM End time: 09/07/2018 8:15 AM Injection made incrementally with aspirations every 5 mL.  Performed by: Personally  Anesthesiologist: Oleta Mouse, MD

## 2018-09-07 NOTE — Progress Notes (Signed)
Orthopedic Tech Progress Note Patient Details:  Marco Arnold 06-14-1966 [id number]  Ortho Devices Type of Ortho Device: Arm sling Ortho Device/Splint Interventions: Application   Post Interventions Patient Tolerated: Well Instructions Provided: Care of device   [name] 09/07/2018, 8:43 AM

## 2018-09-07 NOTE — Op Note (Signed)
Please see dictated report [id number]

## 2018-09-09 ENCOUNTER — Encounter (HOSPITAL_COMMUNITY): Payer: Self-pay | Admitting: Orthopedic Surgery

## 2018-09-09 DIAGNOSIS — N2581 Secondary hyperparathyroidism of renal origin: Secondary | ICD-10-CM | POA: Diagnosis not present

## 2018-09-09 DIAGNOSIS — D509 Iron deficiency anemia, unspecified: Secondary | ICD-10-CM | POA: Diagnosis not present

## 2018-09-09 DIAGNOSIS — E875 Hyperkalemia: Secondary | ICD-10-CM | POA: Diagnosis not present

## 2018-09-09 DIAGNOSIS — D631 Anemia in chronic kidney disease: Secondary | ICD-10-CM | POA: Diagnosis not present

## 2018-09-09 DIAGNOSIS — I120 Hypertensive chronic kidney disease with stage 5 chronic kidney disease or end stage renal disease: Secondary | ICD-10-CM | POA: Diagnosis not present

## 2018-09-09 DIAGNOSIS — N186 End stage renal disease: Secondary | ICD-10-CM | POA: Diagnosis not present

## 2018-09-11 DIAGNOSIS — D509 Iron deficiency anemia, unspecified: Secondary | ICD-10-CM | POA: Diagnosis not present

## 2018-09-11 DIAGNOSIS — N186 End stage renal disease: Secondary | ICD-10-CM | POA: Diagnosis not present

## 2018-09-11 DIAGNOSIS — I120 Hypertensive chronic kidney disease with stage 5 chronic kidney disease or end stage renal disease: Secondary | ICD-10-CM | POA: Diagnosis not present

## 2018-09-11 DIAGNOSIS — N2581 Secondary hyperparathyroidism of renal origin: Secondary | ICD-10-CM | POA: Diagnosis not present

## 2018-09-11 DIAGNOSIS — E875 Hyperkalemia: Secondary | ICD-10-CM | POA: Diagnosis not present

## 2018-09-11 DIAGNOSIS — D631 Anemia in chronic kidney disease: Secondary | ICD-10-CM | POA: Diagnosis not present

## 2018-09-12 ENCOUNTER — Encounter (HOSPITAL_COMMUNITY): Payer: Self-pay | Admitting: Orthopedic Surgery

## 2018-09-12 NOTE — Anesthesia Postprocedure Evaluation (Signed)
Anesthesia Post Note  Patient: Marco Arnold  Procedure(s) Performed: EXCISION MASS RIGHT THUMB (Right )     Patient location during evaluation: PACU Anesthesia Type: Regional and MAC Level of consciousness: awake and alert Pain management: pain level controlled Vital Signs Assessment: post-procedure vital signs reviewed and stable Respiratory status: spontaneous breathing, nonlabored ventilation, respiratory function stable and patient connected to nasal cannula oxygen Cardiovascular status: stable and blood pressure returned to baseline Postop Assessment: no apparent nausea or vomiting Anesthetic complications: no    Last Vitals:  Vitals:   09/07/18 0836 09/07/18 0845  BP: (!) 122/91   Pulse: 77   Resp: 18   Temp:  (!) 36.2 C  SpO2: 98%     Last Pain:  Vitals:   09/07/18 0836  TempSrc:   PainSc: 0-No pain                 Milli Woolridge

## 2018-09-13 DIAGNOSIS — N186 End stage renal disease: Secondary | ICD-10-CM | POA: Diagnosis not present

## 2018-09-13 DIAGNOSIS — I120 Hypertensive chronic kidney disease with stage 5 chronic kidney disease or end stage renal disease: Secondary | ICD-10-CM | POA: Diagnosis not present

## 2018-09-13 DIAGNOSIS — E875 Hyperkalemia: Secondary | ICD-10-CM | POA: Diagnosis not present

## 2018-09-13 DIAGNOSIS — N2581 Secondary hyperparathyroidism of renal origin: Secondary | ICD-10-CM | POA: Diagnosis not present

## 2018-09-13 DIAGNOSIS — D509 Iron deficiency anemia, unspecified: Secondary | ICD-10-CM | POA: Diagnosis not present

## 2018-09-13 DIAGNOSIS — D631 Anemia in chronic kidney disease: Secondary | ICD-10-CM | POA: Diagnosis not present

## 2018-09-16 DIAGNOSIS — D631 Anemia in chronic kidney disease: Secondary | ICD-10-CM | POA: Diagnosis not present

## 2018-09-16 DIAGNOSIS — N2581 Secondary hyperparathyroidism of renal origin: Secondary | ICD-10-CM | POA: Diagnosis not present

## 2018-09-16 DIAGNOSIS — I120 Hypertensive chronic kidney disease with stage 5 chronic kidney disease or end stage renal disease: Secondary | ICD-10-CM | POA: Diagnosis not present

## 2018-09-16 DIAGNOSIS — E875 Hyperkalemia: Secondary | ICD-10-CM | POA: Diagnosis not present

## 2018-09-16 DIAGNOSIS — N186 End stage renal disease: Secondary | ICD-10-CM | POA: Diagnosis not present

## 2018-09-16 DIAGNOSIS — D509 Iron deficiency anemia, unspecified: Secondary | ICD-10-CM | POA: Diagnosis not present

## 2018-09-18 DIAGNOSIS — N2581 Secondary hyperparathyroidism of renal origin: Secondary | ICD-10-CM | POA: Diagnosis not present

## 2018-09-18 DIAGNOSIS — D631 Anemia in chronic kidney disease: Secondary | ICD-10-CM | POA: Diagnosis not present

## 2018-09-18 DIAGNOSIS — D509 Iron deficiency anemia, unspecified: Secondary | ICD-10-CM | POA: Diagnosis not present

## 2018-09-18 DIAGNOSIS — E875 Hyperkalemia: Secondary | ICD-10-CM | POA: Diagnosis not present

## 2018-09-18 DIAGNOSIS — N186 End stage renal disease: Secondary | ICD-10-CM | POA: Diagnosis not present

## 2018-09-18 DIAGNOSIS — I120 Hypertensive chronic kidney disease with stage 5 chronic kidney disease or end stage renal disease: Secondary | ICD-10-CM | POA: Diagnosis not present

## 2018-09-19 DIAGNOSIS — N186 End stage renal disease: Secondary | ICD-10-CM | POA: Diagnosis not present

## 2018-09-19 DIAGNOSIS — Z955 Presence of coronary angioplasty implant and graft: Secondary | ICD-10-CM | POA: Diagnosis not present

## 2018-09-19 DIAGNOSIS — R9439 Abnormal result of other cardiovascular function study: Secondary | ICD-10-CM | POA: Diagnosis not present

## 2018-09-19 DIAGNOSIS — N2581 Secondary hyperparathyroidism of renal origin: Secondary | ICD-10-CM | POA: Diagnosis not present

## 2018-09-19 DIAGNOSIS — E875 Hyperkalemia: Secondary | ICD-10-CM | POA: Diagnosis not present

## 2018-09-19 DIAGNOSIS — Z992 Dependence on renal dialysis: Secondary | ICD-10-CM | POA: Diagnosis not present

## 2018-09-19 DIAGNOSIS — F17210 Nicotine dependence, cigarettes, uncomplicated: Secondary | ICD-10-CM | POA: Diagnosis not present

## 2018-09-19 DIAGNOSIS — I120 Hypertensive chronic kidney disease with stage 5 chronic kidney disease or end stage renal disease: Secondary | ICD-10-CM | POA: Diagnosis not present

## 2018-09-19 DIAGNOSIS — I2510 Atherosclerotic heart disease of native coronary artery without angina pectoris: Secondary | ICD-10-CM | POA: Diagnosis not present

## 2018-09-19 HISTORY — PX: CARDIAC CATHETERIZATION: SHX172

## 2018-09-20 DIAGNOSIS — I120 Hypertensive chronic kidney disease with stage 5 chronic kidney disease or end stage renal disease: Secondary | ICD-10-CM | POA: Diagnosis not present

## 2018-09-20 DIAGNOSIS — Z8261 Family history of arthritis: Secondary | ICD-10-CM | POA: Diagnosis not present

## 2018-09-20 DIAGNOSIS — N186 End stage renal disease: Secondary | ICD-10-CM | POA: Diagnosis not present

## 2018-09-20 DIAGNOSIS — Z809 Family history of malignant neoplasm, unspecified: Secondary | ICD-10-CM | POA: Diagnosis not present

## 2018-09-20 DIAGNOSIS — T82858A Stenosis of vascular prosthetic devices, implants and grafts, initial encounter: Secondary | ICD-10-CM | POA: Diagnosis present

## 2018-09-20 DIAGNOSIS — Z8249 Family history of ischemic heart disease and other diseases of the circulatory system: Secondary | ICD-10-CM | POA: Diagnosis not present

## 2018-09-20 DIAGNOSIS — R9439 Abnormal result of other cardiovascular function study: Secondary | ICD-10-CM | POA: Diagnosis not present

## 2018-09-20 DIAGNOSIS — Z720 Tobacco use: Secondary | ICD-10-CM | POA: Diagnosis not present

## 2018-09-20 DIAGNOSIS — Z833 Family history of diabetes mellitus: Secondary | ICD-10-CM | POA: Diagnosis not present

## 2018-09-20 DIAGNOSIS — D649 Anemia, unspecified: Secondary | ICD-10-CM | POA: Diagnosis not present

## 2018-09-20 DIAGNOSIS — I2510 Atherosclerotic heart disease of native coronary artery without angina pectoris: Secondary | ICD-10-CM | POA: Diagnosis present

## 2018-09-20 DIAGNOSIS — E875 Hyperkalemia: Secondary | ICD-10-CM | POA: Diagnosis present

## 2018-09-20 DIAGNOSIS — Z79899 Other long term (current) drug therapy: Secondary | ICD-10-CM | POA: Diagnosis not present

## 2018-09-20 DIAGNOSIS — F17210 Nicotine dependence, cigarettes, uncomplicated: Secondary | ICD-10-CM | POA: Diagnosis present

## 2018-09-20 DIAGNOSIS — G2581 Restless legs syndrome: Secondary | ICD-10-CM | POA: Diagnosis not present

## 2018-09-20 DIAGNOSIS — K219 Gastro-esophageal reflux disease without esophagitis: Secondary | ICD-10-CM | POA: Diagnosis not present

## 2018-09-20 DIAGNOSIS — Z992 Dependence on renal dialysis: Secondary | ICD-10-CM | POA: Diagnosis not present

## 2018-09-21 DIAGNOSIS — D509 Iron deficiency anemia, unspecified: Secondary | ICD-10-CM | POA: Diagnosis not present

## 2018-09-21 DIAGNOSIS — N2581 Secondary hyperparathyroidism of renal origin: Secondary | ICD-10-CM | POA: Diagnosis not present

## 2018-09-21 DIAGNOSIS — I120 Hypertensive chronic kidney disease with stage 5 chronic kidney disease or end stage renal disease: Secondary | ICD-10-CM | POA: Diagnosis not present

## 2018-09-21 DIAGNOSIS — E875 Hyperkalemia: Secondary | ICD-10-CM | POA: Diagnosis not present

## 2018-09-21 DIAGNOSIS — D631 Anemia in chronic kidney disease: Secondary | ICD-10-CM | POA: Diagnosis not present

## 2018-09-21 DIAGNOSIS — N186 End stage renal disease: Secondary | ICD-10-CM | POA: Diagnosis not present

## 2018-09-23 DIAGNOSIS — I120 Hypertensive chronic kidney disease with stage 5 chronic kidney disease or end stage renal disease: Secondary | ICD-10-CM | POA: Diagnosis not present

## 2018-09-23 DIAGNOSIS — N2581 Secondary hyperparathyroidism of renal origin: Secondary | ICD-10-CM | POA: Diagnosis not present

## 2018-09-23 DIAGNOSIS — E875 Hyperkalemia: Secondary | ICD-10-CM | POA: Diagnosis not present

## 2018-09-23 DIAGNOSIS — N186 End stage renal disease: Secondary | ICD-10-CM | POA: Diagnosis not present

## 2018-09-23 DIAGNOSIS — D631 Anemia in chronic kidney disease: Secondary | ICD-10-CM | POA: Diagnosis not present

## 2018-09-23 DIAGNOSIS — D509 Iron deficiency anemia, unspecified: Secondary | ICD-10-CM | POA: Diagnosis not present

## 2018-09-23 DIAGNOSIS — M79641 Pain in right hand: Secondary | ICD-10-CM | POA: Diagnosis not present

## 2018-09-25 DIAGNOSIS — D631 Anemia in chronic kidney disease: Secondary | ICD-10-CM | POA: Diagnosis not present

## 2018-09-25 DIAGNOSIS — D509 Iron deficiency anemia, unspecified: Secondary | ICD-10-CM | POA: Diagnosis not present

## 2018-09-25 DIAGNOSIS — E875 Hyperkalemia: Secondary | ICD-10-CM | POA: Diagnosis not present

## 2018-09-25 DIAGNOSIS — I120 Hypertensive chronic kidney disease with stage 5 chronic kidney disease or end stage renal disease: Secondary | ICD-10-CM | POA: Diagnosis not present

## 2018-09-25 DIAGNOSIS — N2581 Secondary hyperparathyroidism of renal origin: Secondary | ICD-10-CM | POA: Diagnosis not present

## 2018-09-25 DIAGNOSIS — N186 End stage renal disease: Secondary | ICD-10-CM | POA: Diagnosis not present

## 2018-09-27 DIAGNOSIS — D509 Iron deficiency anemia, unspecified: Secondary | ICD-10-CM | POA: Diagnosis not present

## 2018-09-27 DIAGNOSIS — N2581 Secondary hyperparathyroidism of renal origin: Secondary | ICD-10-CM | POA: Diagnosis not present

## 2018-09-27 DIAGNOSIS — N186 End stage renal disease: Secondary | ICD-10-CM | POA: Diagnosis not present

## 2018-09-27 DIAGNOSIS — D631 Anemia in chronic kidney disease: Secondary | ICD-10-CM | POA: Diagnosis not present

## 2018-09-27 DIAGNOSIS — E875 Hyperkalemia: Secondary | ICD-10-CM | POA: Diagnosis not present

## 2018-09-27 DIAGNOSIS — I120 Hypertensive chronic kidney disease with stage 5 chronic kidney disease or end stage renal disease: Secondary | ICD-10-CM | POA: Diagnosis not present

## 2018-09-28 DIAGNOSIS — I120 Hypertensive chronic kidney disease with stage 5 chronic kidney disease or end stage renal disease: Secondary | ICD-10-CM | POA: Diagnosis not present

## 2018-09-30 DIAGNOSIS — N2581 Secondary hyperparathyroidism of renal origin: Secondary | ICD-10-CM | POA: Diagnosis not present

## 2018-09-30 DIAGNOSIS — D509 Iron deficiency anemia, unspecified: Secondary | ICD-10-CM | POA: Diagnosis not present

## 2018-09-30 DIAGNOSIS — N186 End stage renal disease: Secondary | ICD-10-CM | POA: Diagnosis not present

## 2018-09-30 DIAGNOSIS — D631 Anemia in chronic kidney disease: Secondary | ICD-10-CM | POA: Diagnosis not present

## 2018-09-30 DIAGNOSIS — Z992 Dependence on renal dialysis: Secondary | ICD-10-CM | POA: Diagnosis not present

## 2018-09-30 DIAGNOSIS — E875 Hyperkalemia: Secondary | ICD-10-CM | POA: Diagnosis not present

## 2018-10-01 DIAGNOSIS — E8779 Other fluid overload: Secondary | ICD-10-CM | POA: Diagnosis not present

## 2018-10-01 DIAGNOSIS — N2581 Secondary hyperparathyroidism of renal origin: Secondary | ICD-10-CM | POA: Diagnosis not present

## 2018-10-01 DIAGNOSIS — N186 End stage renal disease: Secondary | ICD-10-CM | POA: Diagnosis not present

## 2018-10-02 DIAGNOSIS — D631 Anemia in chronic kidney disease: Secondary | ICD-10-CM | POA: Diagnosis not present

## 2018-10-02 DIAGNOSIS — N186 End stage renal disease: Secondary | ICD-10-CM | POA: Diagnosis not present

## 2018-10-02 DIAGNOSIS — E875 Hyperkalemia: Secondary | ICD-10-CM | POA: Diagnosis not present

## 2018-10-02 DIAGNOSIS — N2581 Secondary hyperparathyroidism of renal origin: Secondary | ICD-10-CM | POA: Diagnosis not present

## 2018-10-02 DIAGNOSIS — D509 Iron deficiency anemia, unspecified: Secondary | ICD-10-CM | POA: Diagnosis not present

## 2018-10-02 DIAGNOSIS — Z992 Dependence on renal dialysis: Secondary | ICD-10-CM | POA: Diagnosis not present

## 2018-10-04 DIAGNOSIS — N186 End stage renal disease: Secondary | ICD-10-CM | POA: Diagnosis not present

## 2018-10-04 DIAGNOSIS — N2581 Secondary hyperparathyroidism of renal origin: Secondary | ICD-10-CM | POA: Diagnosis not present

## 2018-10-04 DIAGNOSIS — D509 Iron deficiency anemia, unspecified: Secondary | ICD-10-CM | POA: Diagnosis not present

## 2018-10-04 DIAGNOSIS — D631 Anemia in chronic kidney disease: Secondary | ICD-10-CM | POA: Diagnosis not present

## 2018-10-04 DIAGNOSIS — Z992 Dependence on renal dialysis: Secondary | ICD-10-CM | POA: Diagnosis not present

## 2018-10-04 DIAGNOSIS — E875 Hyperkalemia: Secondary | ICD-10-CM | POA: Diagnosis not present

## 2018-10-07 DIAGNOSIS — Z992 Dependence on renal dialysis: Secondary | ICD-10-CM | POA: Diagnosis not present

## 2018-10-07 DIAGNOSIS — E875 Hyperkalemia: Secondary | ICD-10-CM | POA: Diagnosis not present

## 2018-10-07 DIAGNOSIS — N186 End stage renal disease: Secondary | ICD-10-CM | POA: Diagnosis not present

## 2018-10-07 DIAGNOSIS — D509 Iron deficiency anemia, unspecified: Secondary | ICD-10-CM | POA: Diagnosis not present

## 2018-10-07 DIAGNOSIS — D631 Anemia in chronic kidney disease: Secondary | ICD-10-CM | POA: Diagnosis not present

## 2018-10-07 DIAGNOSIS — N2581 Secondary hyperparathyroidism of renal origin: Secondary | ICD-10-CM | POA: Diagnosis not present

## 2018-10-09 DIAGNOSIS — D631 Anemia in chronic kidney disease: Secondary | ICD-10-CM | POA: Diagnosis not present

## 2018-10-09 DIAGNOSIS — D509 Iron deficiency anemia, unspecified: Secondary | ICD-10-CM | POA: Diagnosis not present

## 2018-10-09 DIAGNOSIS — N2581 Secondary hyperparathyroidism of renal origin: Secondary | ICD-10-CM | POA: Diagnosis not present

## 2018-10-09 DIAGNOSIS — Z992 Dependence on renal dialysis: Secondary | ICD-10-CM | POA: Diagnosis not present

## 2018-10-09 DIAGNOSIS — E875 Hyperkalemia: Secondary | ICD-10-CM | POA: Diagnosis not present

## 2018-10-09 DIAGNOSIS — N186 End stage renal disease: Secondary | ICD-10-CM | POA: Diagnosis not present

## 2018-10-11 DIAGNOSIS — N2581 Secondary hyperparathyroidism of renal origin: Secondary | ICD-10-CM | POA: Diagnosis not present

## 2018-10-11 DIAGNOSIS — D631 Anemia in chronic kidney disease: Secondary | ICD-10-CM | POA: Diagnosis not present

## 2018-10-11 DIAGNOSIS — N186 End stage renal disease: Secondary | ICD-10-CM | POA: Diagnosis not present

## 2018-10-11 DIAGNOSIS — Z992 Dependence on renal dialysis: Secondary | ICD-10-CM | POA: Diagnosis not present

## 2018-10-11 DIAGNOSIS — D509 Iron deficiency anemia, unspecified: Secondary | ICD-10-CM | POA: Diagnosis not present

## 2018-10-11 DIAGNOSIS — E875 Hyperkalemia: Secondary | ICD-10-CM | POA: Diagnosis not present

## 2018-10-12 DIAGNOSIS — D509 Iron deficiency anemia, unspecified: Secondary | ICD-10-CM | POA: Diagnosis not present

## 2018-10-12 DIAGNOSIS — N2581 Secondary hyperparathyroidism of renal origin: Secondary | ICD-10-CM | POA: Diagnosis not present

## 2018-10-12 DIAGNOSIS — N186 End stage renal disease: Secondary | ICD-10-CM | POA: Diagnosis not present

## 2018-10-12 DIAGNOSIS — D631 Anemia in chronic kidney disease: Secondary | ICD-10-CM | POA: Diagnosis not present

## 2018-10-12 DIAGNOSIS — Z992 Dependence on renal dialysis: Secondary | ICD-10-CM | POA: Diagnosis not present

## 2018-10-12 DIAGNOSIS — E875 Hyperkalemia: Secondary | ICD-10-CM | POA: Diagnosis not present

## 2018-10-14 DIAGNOSIS — N186 End stage renal disease: Secondary | ICD-10-CM | POA: Diagnosis not present

## 2018-10-14 DIAGNOSIS — Z992 Dependence on renal dialysis: Secondary | ICD-10-CM | POA: Diagnosis not present

## 2018-10-14 DIAGNOSIS — D509 Iron deficiency anemia, unspecified: Secondary | ICD-10-CM | POA: Diagnosis not present

## 2018-10-14 DIAGNOSIS — E875 Hyperkalemia: Secondary | ICD-10-CM | POA: Diagnosis not present

## 2018-10-14 DIAGNOSIS — N2581 Secondary hyperparathyroidism of renal origin: Secondary | ICD-10-CM | POA: Diagnosis not present

## 2018-10-14 DIAGNOSIS — D631 Anemia in chronic kidney disease: Secondary | ICD-10-CM | POA: Diagnosis not present

## 2018-10-16 DIAGNOSIS — N186 End stage renal disease: Secondary | ICD-10-CM | POA: Diagnosis not present

## 2018-10-16 DIAGNOSIS — N2581 Secondary hyperparathyroidism of renal origin: Secondary | ICD-10-CM | POA: Diagnosis not present

## 2018-10-16 DIAGNOSIS — Z992 Dependence on renal dialysis: Secondary | ICD-10-CM | POA: Diagnosis not present

## 2018-10-16 DIAGNOSIS — D509 Iron deficiency anemia, unspecified: Secondary | ICD-10-CM | POA: Diagnosis not present

## 2018-10-16 DIAGNOSIS — E875 Hyperkalemia: Secondary | ICD-10-CM | POA: Diagnosis not present

## 2018-10-16 DIAGNOSIS — D631 Anemia in chronic kidney disease: Secondary | ICD-10-CM | POA: Diagnosis not present

## 2018-10-18 DIAGNOSIS — N2581 Secondary hyperparathyroidism of renal origin: Secondary | ICD-10-CM | POA: Diagnosis not present

## 2018-10-18 DIAGNOSIS — D509 Iron deficiency anemia, unspecified: Secondary | ICD-10-CM | POA: Diagnosis not present

## 2018-10-18 DIAGNOSIS — Z992 Dependence on renal dialysis: Secondary | ICD-10-CM | POA: Diagnosis not present

## 2018-10-18 DIAGNOSIS — E875 Hyperkalemia: Secondary | ICD-10-CM | POA: Diagnosis not present

## 2018-10-18 DIAGNOSIS — D631 Anemia in chronic kidney disease: Secondary | ICD-10-CM | POA: Diagnosis not present

## 2018-10-18 DIAGNOSIS — N186 End stage renal disease: Secondary | ICD-10-CM | POA: Diagnosis not present

## 2018-10-21 DIAGNOSIS — D631 Anemia in chronic kidney disease: Secondary | ICD-10-CM | POA: Diagnosis not present

## 2018-10-21 DIAGNOSIS — N2581 Secondary hyperparathyroidism of renal origin: Secondary | ICD-10-CM | POA: Diagnosis not present

## 2018-10-21 DIAGNOSIS — Z992 Dependence on renal dialysis: Secondary | ICD-10-CM | POA: Diagnosis not present

## 2018-10-21 DIAGNOSIS — N186 End stage renal disease: Secondary | ICD-10-CM | POA: Diagnosis not present

## 2018-10-21 DIAGNOSIS — D509 Iron deficiency anemia, unspecified: Secondary | ICD-10-CM | POA: Diagnosis not present

## 2018-10-21 DIAGNOSIS — E875 Hyperkalemia: Secondary | ICD-10-CM | POA: Diagnosis not present

## 2018-10-23 DIAGNOSIS — D631 Anemia in chronic kidney disease: Secondary | ICD-10-CM | POA: Diagnosis not present

## 2018-10-23 DIAGNOSIS — N186 End stage renal disease: Secondary | ICD-10-CM | POA: Diagnosis not present

## 2018-10-23 DIAGNOSIS — N2581 Secondary hyperparathyroidism of renal origin: Secondary | ICD-10-CM | POA: Diagnosis not present

## 2018-10-23 DIAGNOSIS — Z992 Dependence on renal dialysis: Secondary | ICD-10-CM | POA: Diagnosis not present

## 2018-10-23 DIAGNOSIS — D509 Iron deficiency anemia, unspecified: Secondary | ICD-10-CM | POA: Diagnosis not present

## 2018-10-23 DIAGNOSIS — E875 Hyperkalemia: Secondary | ICD-10-CM | POA: Diagnosis not present

## 2018-10-25 DIAGNOSIS — N2581 Secondary hyperparathyroidism of renal origin: Secondary | ICD-10-CM | POA: Diagnosis not present

## 2018-10-25 DIAGNOSIS — N186 End stage renal disease: Secondary | ICD-10-CM | POA: Diagnosis not present

## 2018-10-25 DIAGNOSIS — Z992 Dependence on renal dialysis: Secondary | ICD-10-CM | POA: Diagnosis not present

## 2018-10-25 DIAGNOSIS — D631 Anemia in chronic kidney disease: Secondary | ICD-10-CM | POA: Diagnosis not present

## 2018-10-25 DIAGNOSIS — E875 Hyperkalemia: Secondary | ICD-10-CM | POA: Diagnosis not present

## 2018-10-25 DIAGNOSIS — D509 Iron deficiency anemia, unspecified: Secondary | ICD-10-CM | POA: Diagnosis not present

## 2018-10-27 DIAGNOSIS — N186 End stage renal disease: Secondary | ICD-10-CM | POA: Diagnosis not present

## 2018-10-27 DIAGNOSIS — I120 Hypertensive chronic kidney disease with stage 5 chronic kidney disease or end stage renal disease: Secondary | ICD-10-CM | POA: Diagnosis not present

## 2018-10-27 DIAGNOSIS — Z992 Dependence on renal dialysis: Secondary | ICD-10-CM | POA: Diagnosis not present

## 2018-10-28 DIAGNOSIS — D631 Anemia in chronic kidney disease: Secondary | ICD-10-CM | POA: Diagnosis not present

## 2018-10-28 DIAGNOSIS — N186 End stage renal disease: Secondary | ICD-10-CM | POA: Diagnosis not present

## 2018-10-28 DIAGNOSIS — D509 Iron deficiency anemia, unspecified: Secondary | ICD-10-CM | POA: Diagnosis not present

## 2018-10-28 DIAGNOSIS — N2581 Secondary hyperparathyroidism of renal origin: Secondary | ICD-10-CM | POA: Diagnosis not present

## 2018-10-28 DIAGNOSIS — Z992 Dependence on renal dialysis: Secondary | ICD-10-CM | POA: Diagnosis not present

## 2018-10-30 DIAGNOSIS — N186 End stage renal disease: Secondary | ICD-10-CM | POA: Diagnosis not present

## 2018-10-30 DIAGNOSIS — D509 Iron deficiency anemia, unspecified: Secondary | ICD-10-CM | POA: Diagnosis not present

## 2018-10-30 DIAGNOSIS — Z992 Dependence on renal dialysis: Secondary | ICD-10-CM | POA: Diagnosis not present

## 2018-10-30 DIAGNOSIS — N2581 Secondary hyperparathyroidism of renal origin: Secondary | ICD-10-CM | POA: Diagnosis not present

## 2018-10-30 DIAGNOSIS — D631 Anemia in chronic kidney disease: Secondary | ICD-10-CM | POA: Diagnosis not present

## 2018-11-01 DIAGNOSIS — N186 End stage renal disease: Secondary | ICD-10-CM | POA: Diagnosis not present

## 2018-11-01 DIAGNOSIS — Z992 Dependence on renal dialysis: Secondary | ICD-10-CM | POA: Diagnosis not present

## 2018-11-01 DIAGNOSIS — D631 Anemia in chronic kidney disease: Secondary | ICD-10-CM | POA: Diagnosis not present

## 2018-11-01 DIAGNOSIS — N2581 Secondary hyperparathyroidism of renal origin: Secondary | ICD-10-CM | POA: Diagnosis not present

## 2018-11-01 DIAGNOSIS — D509 Iron deficiency anemia, unspecified: Secondary | ICD-10-CM | POA: Diagnosis not present

## 2018-11-04 DIAGNOSIS — D509 Iron deficiency anemia, unspecified: Secondary | ICD-10-CM | POA: Diagnosis not present

## 2018-11-04 DIAGNOSIS — N186 End stage renal disease: Secondary | ICD-10-CM | POA: Diagnosis not present

## 2018-11-04 DIAGNOSIS — Z992 Dependence on renal dialysis: Secondary | ICD-10-CM | POA: Diagnosis not present

## 2018-11-04 DIAGNOSIS — D631 Anemia in chronic kidney disease: Secondary | ICD-10-CM | POA: Diagnosis not present

## 2018-11-04 DIAGNOSIS — N2581 Secondary hyperparathyroidism of renal origin: Secondary | ICD-10-CM | POA: Diagnosis not present

## 2018-11-06 DIAGNOSIS — D631 Anemia in chronic kidney disease: Secondary | ICD-10-CM | POA: Diagnosis not present

## 2018-11-06 DIAGNOSIS — N2581 Secondary hyperparathyroidism of renal origin: Secondary | ICD-10-CM | POA: Diagnosis not present

## 2018-11-06 DIAGNOSIS — N186 End stage renal disease: Secondary | ICD-10-CM | POA: Diagnosis not present

## 2018-11-06 DIAGNOSIS — Z992 Dependence on renal dialysis: Secondary | ICD-10-CM | POA: Diagnosis not present

## 2018-11-06 DIAGNOSIS — D509 Iron deficiency anemia, unspecified: Secondary | ICD-10-CM | POA: Diagnosis not present

## 2018-11-08 DIAGNOSIS — Z992 Dependence on renal dialysis: Secondary | ICD-10-CM | POA: Diagnosis not present

## 2018-11-08 DIAGNOSIS — N186 End stage renal disease: Secondary | ICD-10-CM | POA: Diagnosis not present

## 2018-11-08 DIAGNOSIS — D631 Anemia in chronic kidney disease: Secondary | ICD-10-CM | POA: Diagnosis not present

## 2018-11-08 DIAGNOSIS — D509 Iron deficiency anemia, unspecified: Secondary | ICD-10-CM | POA: Diagnosis not present

## 2018-11-08 DIAGNOSIS — N2581 Secondary hyperparathyroidism of renal origin: Secondary | ICD-10-CM | POA: Diagnosis not present

## 2018-11-11 DIAGNOSIS — Z992 Dependence on renal dialysis: Secondary | ICD-10-CM | POA: Diagnosis not present

## 2018-11-11 DIAGNOSIS — N186 End stage renal disease: Secondary | ICD-10-CM | POA: Diagnosis not present

## 2018-11-11 DIAGNOSIS — D631 Anemia in chronic kidney disease: Secondary | ICD-10-CM | POA: Diagnosis not present

## 2018-11-11 DIAGNOSIS — N2581 Secondary hyperparathyroidism of renal origin: Secondary | ICD-10-CM | POA: Diagnosis not present

## 2018-11-11 DIAGNOSIS — D509 Iron deficiency anemia, unspecified: Secondary | ICD-10-CM | POA: Diagnosis not present

## 2018-11-13 DIAGNOSIS — N186 End stage renal disease: Secondary | ICD-10-CM | POA: Diagnosis not present

## 2018-11-13 DIAGNOSIS — N2581 Secondary hyperparathyroidism of renal origin: Secondary | ICD-10-CM | POA: Diagnosis not present

## 2018-11-13 DIAGNOSIS — Z992 Dependence on renal dialysis: Secondary | ICD-10-CM | POA: Diagnosis not present

## 2018-11-13 DIAGNOSIS — D631 Anemia in chronic kidney disease: Secondary | ICD-10-CM | POA: Diagnosis not present

## 2018-11-13 DIAGNOSIS — D509 Iron deficiency anemia, unspecified: Secondary | ICD-10-CM | POA: Diagnosis not present

## 2018-11-15 DIAGNOSIS — N186 End stage renal disease: Secondary | ICD-10-CM | POA: Diagnosis not present

## 2018-11-15 DIAGNOSIS — N2581 Secondary hyperparathyroidism of renal origin: Secondary | ICD-10-CM | POA: Diagnosis not present

## 2018-11-15 DIAGNOSIS — D509 Iron deficiency anemia, unspecified: Secondary | ICD-10-CM | POA: Diagnosis not present

## 2018-11-15 DIAGNOSIS — Z992 Dependence on renal dialysis: Secondary | ICD-10-CM | POA: Diagnosis not present

## 2018-11-15 DIAGNOSIS — D631 Anemia in chronic kidney disease: Secondary | ICD-10-CM | POA: Diagnosis not present

## 2018-11-18 DIAGNOSIS — N186 End stage renal disease: Secondary | ICD-10-CM | POA: Diagnosis not present

## 2018-11-18 DIAGNOSIS — N2581 Secondary hyperparathyroidism of renal origin: Secondary | ICD-10-CM | POA: Diagnosis not present

## 2018-11-18 DIAGNOSIS — D631 Anemia in chronic kidney disease: Secondary | ICD-10-CM | POA: Diagnosis not present

## 2018-11-18 DIAGNOSIS — D509 Iron deficiency anemia, unspecified: Secondary | ICD-10-CM | POA: Diagnosis not present

## 2018-11-18 DIAGNOSIS — Z992 Dependence on renal dialysis: Secondary | ICD-10-CM | POA: Diagnosis not present

## 2018-11-20 DIAGNOSIS — D631 Anemia in chronic kidney disease: Secondary | ICD-10-CM | POA: Diagnosis not present

## 2018-11-20 DIAGNOSIS — Z992 Dependence on renal dialysis: Secondary | ICD-10-CM | POA: Diagnosis not present

## 2018-11-20 DIAGNOSIS — D509 Iron deficiency anemia, unspecified: Secondary | ICD-10-CM | POA: Diagnosis not present

## 2018-11-20 DIAGNOSIS — N186 End stage renal disease: Secondary | ICD-10-CM | POA: Diagnosis not present

## 2018-11-20 DIAGNOSIS — N2581 Secondary hyperparathyroidism of renal origin: Secondary | ICD-10-CM | POA: Diagnosis not present

## 2018-11-22 DIAGNOSIS — Z992 Dependence on renal dialysis: Secondary | ICD-10-CM | POA: Diagnosis not present

## 2018-11-22 DIAGNOSIS — N186 End stage renal disease: Secondary | ICD-10-CM | POA: Diagnosis not present

## 2018-11-22 DIAGNOSIS — D509 Iron deficiency anemia, unspecified: Secondary | ICD-10-CM | POA: Diagnosis not present

## 2018-11-22 DIAGNOSIS — N2581 Secondary hyperparathyroidism of renal origin: Secondary | ICD-10-CM | POA: Diagnosis not present

## 2018-11-22 DIAGNOSIS — D631 Anemia in chronic kidney disease: Secondary | ICD-10-CM | POA: Diagnosis not present

## 2018-11-25 DIAGNOSIS — Z992 Dependence on renal dialysis: Secondary | ICD-10-CM | POA: Diagnosis not present

## 2018-11-25 DIAGNOSIS — N2581 Secondary hyperparathyroidism of renal origin: Secondary | ICD-10-CM | POA: Diagnosis not present

## 2018-11-25 DIAGNOSIS — N186 End stage renal disease: Secondary | ICD-10-CM | POA: Diagnosis not present

## 2018-11-25 DIAGNOSIS — D631 Anemia in chronic kidney disease: Secondary | ICD-10-CM | POA: Diagnosis not present

## 2018-11-25 DIAGNOSIS — D509 Iron deficiency anemia, unspecified: Secondary | ICD-10-CM | POA: Diagnosis not present

## 2018-11-26 DIAGNOSIS — M545 Low back pain: Secondary | ICD-10-CM | POA: Diagnosis not present

## 2018-11-27 DIAGNOSIS — Z992 Dependence on renal dialysis: Secondary | ICD-10-CM | POA: Diagnosis not present

## 2018-11-27 DIAGNOSIS — I120 Hypertensive chronic kidney disease with stage 5 chronic kidney disease or end stage renal disease: Secondary | ICD-10-CM | POA: Diagnosis not present

## 2018-11-27 DIAGNOSIS — N2581 Secondary hyperparathyroidism of renal origin: Secondary | ICD-10-CM | POA: Diagnosis not present

## 2018-11-27 DIAGNOSIS — D631 Anemia in chronic kidney disease: Secondary | ICD-10-CM | POA: Diagnosis not present

## 2018-11-27 DIAGNOSIS — D509 Iron deficiency anemia, unspecified: Secondary | ICD-10-CM | POA: Diagnosis not present

## 2018-11-27 DIAGNOSIS — N186 End stage renal disease: Secondary | ICD-10-CM | POA: Diagnosis not present

## 2018-11-29 DIAGNOSIS — Z992 Dependence on renal dialysis: Secondary | ICD-10-CM | POA: Diagnosis not present

## 2018-11-29 DIAGNOSIS — N186 End stage renal disease: Secondary | ICD-10-CM | POA: Diagnosis not present

## 2018-11-29 DIAGNOSIS — D631 Anemia in chronic kidney disease: Secondary | ICD-10-CM | POA: Diagnosis not present

## 2018-11-29 DIAGNOSIS — N2581 Secondary hyperparathyroidism of renal origin: Secondary | ICD-10-CM | POA: Diagnosis not present

## 2018-11-29 DIAGNOSIS — D509 Iron deficiency anemia, unspecified: Secondary | ICD-10-CM | POA: Diagnosis not present

## 2018-12-02 DIAGNOSIS — N2581 Secondary hyperparathyroidism of renal origin: Secondary | ICD-10-CM | POA: Diagnosis not present

## 2018-12-02 DIAGNOSIS — D509 Iron deficiency anemia, unspecified: Secondary | ICD-10-CM | POA: Diagnosis not present

## 2018-12-02 DIAGNOSIS — D631 Anemia in chronic kidney disease: Secondary | ICD-10-CM | POA: Diagnosis not present

## 2018-12-02 DIAGNOSIS — N186 End stage renal disease: Secondary | ICD-10-CM | POA: Diagnosis not present

## 2018-12-02 DIAGNOSIS — Z992 Dependence on renal dialysis: Secondary | ICD-10-CM | POA: Diagnosis not present

## 2018-12-04 DIAGNOSIS — N186 End stage renal disease: Secondary | ICD-10-CM | POA: Diagnosis not present

## 2018-12-04 DIAGNOSIS — Z992 Dependence on renal dialysis: Secondary | ICD-10-CM | POA: Diagnosis not present

## 2018-12-04 DIAGNOSIS — D509 Iron deficiency anemia, unspecified: Secondary | ICD-10-CM | POA: Diagnosis not present

## 2018-12-04 DIAGNOSIS — D631 Anemia in chronic kidney disease: Secondary | ICD-10-CM | POA: Diagnosis not present

## 2018-12-04 DIAGNOSIS — N2581 Secondary hyperparathyroidism of renal origin: Secondary | ICD-10-CM | POA: Diagnosis not present

## 2018-12-06 DIAGNOSIS — Z992 Dependence on renal dialysis: Secondary | ICD-10-CM | POA: Diagnosis not present

## 2018-12-06 DIAGNOSIS — D631 Anemia in chronic kidney disease: Secondary | ICD-10-CM | POA: Diagnosis not present

## 2018-12-06 DIAGNOSIS — N186 End stage renal disease: Secondary | ICD-10-CM | POA: Diagnosis not present

## 2018-12-06 DIAGNOSIS — D509 Iron deficiency anemia, unspecified: Secondary | ICD-10-CM | POA: Diagnosis not present

## 2018-12-06 DIAGNOSIS — N2581 Secondary hyperparathyroidism of renal origin: Secondary | ICD-10-CM | POA: Diagnosis not present

## 2018-12-09 DIAGNOSIS — Z992 Dependence on renal dialysis: Secondary | ICD-10-CM | POA: Diagnosis not present

## 2018-12-09 DIAGNOSIS — D509 Iron deficiency anemia, unspecified: Secondary | ICD-10-CM | POA: Diagnosis not present

## 2018-12-09 DIAGNOSIS — N186 End stage renal disease: Secondary | ICD-10-CM | POA: Diagnosis not present

## 2018-12-09 DIAGNOSIS — N2581 Secondary hyperparathyroidism of renal origin: Secondary | ICD-10-CM | POA: Diagnosis not present

## 2018-12-09 DIAGNOSIS — D631 Anemia in chronic kidney disease: Secondary | ICD-10-CM | POA: Diagnosis not present

## 2018-12-11 DIAGNOSIS — D631 Anemia in chronic kidney disease: Secondary | ICD-10-CM | POA: Diagnosis not present

## 2018-12-11 DIAGNOSIS — D509 Iron deficiency anemia, unspecified: Secondary | ICD-10-CM | POA: Diagnosis not present

## 2018-12-11 DIAGNOSIS — N2581 Secondary hyperparathyroidism of renal origin: Secondary | ICD-10-CM | POA: Diagnosis not present

## 2018-12-11 DIAGNOSIS — Z992 Dependence on renal dialysis: Secondary | ICD-10-CM | POA: Diagnosis not present

## 2018-12-11 DIAGNOSIS — N186 End stage renal disease: Secondary | ICD-10-CM | POA: Diagnosis not present

## 2018-12-13 DIAGNOSIS — D631 Anemia in chronic kidney disease: Secondary | ICD-10-CM | POA: Diagnosis not present

## 2018-12-13 DIAGNOSIS — N2581 Secondary hyperparathyroidism of renal origin: Secondary | ICD-10-CM | POA: Diagnosis not present

## 2018-12-13 DIAGNOSIS — N186 End stage renal disease: Secondary | ICD-10-CM | POA: Diagnosis not present

## 2018-12-13 DIAGNOSIS — Z992 Dependence on renal dialysis: Secondary | ICD-10-CM | POA: Diagnosis not present

## 2018-12-13 DIAGNOSIS — D509 Iron deficiency anemia, unspecified: Secondary | ICD-10-CM | POA: Diagnosis not present

## 2018-12-16 DIAGNOSIS — D631 Anemia in chronic kidney disease: Secondary | ICD-10-CM | POA: Diagnosis not present

## 2018-12-16 DIAGNOSIS — N186 End stage renal disease: Secondary | ICD-10-CM | POA: Diagnosis not present

## 2018-12-16 DIAGNOSIS — D509 Iron deficiency anemia, unspecified: Secondary | ICD-10-CM | POA: Diagnosis not present

## 2018-12-16 DIAGNOSIS — N2581 Secondary hyperparathyroidism of renal origin: Secondary | ICD-10-CM | POA: Diagnosis not present

## 2018-12-16 DIAGNOSIS — Z992 Dependence on renal dialysis: Secondary | ICD-10-CM | POA: Diagnosis not present

## 2018-12-18 DIAGNOSIS — D631 Anemia in chronic kidney disease: Secondary | ICD-10-CM | POA: Diagnosis not present

## 2018-12-18 DIAGNOSIS — N2581 Secondary hyperparathyroidism of renal origin: Secondary | ICD-10-CM | POA: Diagnosis not present

## 2018-12-18 DIAGNOSIS — D509 Iron deficiency anemia, unspecified: Secondary | ICD-10-CM | POA: Diagnosis not present

## 2018-12-18 DIAGNOSIS — Z992 Dependence on renal dialysis: Secondary | ICD-10-CM | POA: Diagnosis not present

## 2018-12-18 DIAGNOSIS — N186 End stage renal disease: Secondary | ICD-10-CM | POA: Diagnosis not present

## 2018-12-20 DIAGNOSIS — N186 End stage renal disease: Secondary | ICD-10-CM | POA: Diagnosis not present

## 2018-12-20 DIAGNOSIS — D631 Anemia in chronic kidney disease: Secondary | ICD-10-CM | POA: Diagnosis not present

## 2018-12-20 DIAGNOSIS — Z992 Dependence on renal dialysis: Secondary | ICD-10-CM | POA: Diagnosis not present

## 2018-12-20 DIAGNOSIS — D509 Iron deficiency anemia, unspecified: Secondary | ICD-10-CM | POA: Diagnosis not present

## 2018-12-20 DIAGNOSIS — N2581 Secondary hyperparathyroidism of renal origin: Secondary | ICD-10-CM | POA: Diagnosis not present

## 2018-12-23 DIAGNOSIS — D631 Anemia in chronic kidney disease: Secondary | ICD-10-CM | POA: Diagnosis not present

## 2018-12-23 DIAGNOSIS — N2581 Secondary hyperparathyroidism of renal origin: Secondary | ICD-10-CM | POA: Diagnosis not present

## 2018-12-23 DIAGNOSIS — N186 End stage renal disease: Secondary | ICD-10-CM | POA: Diagnosis not present

## 2018-12-23 DIAGNOSIS — Z992 Dependence on renal dialysis: Secondary | ICD-10-CM | POA: Diagnosis not present

## 2018-12-23 DIAGNOSIS — D509 Iron deficiency anemia, unspecified: Secondary | ICD-10-CM | POA: Diagnosis not present

## 2018-12-25 DIAGNOSIS — D631 Anemia in chronic kidney disease: Secondary | ICD-10-CM | POA: Diagnosis not present

## 2018-12-25 DIAGNOSIS — D509 Iron deficiency anemia, unspecified: Secondary | ICD-10-CM | POA: Diagnosis not present

## 2018-12-25 DIAGNOSIS — N186 End stage renal disease: Secondary | ICD-10-CM | POA: Diagnosis not present

## 2018-12-25 DIAGNOSIS — Z992 Dependence on renal dialysis: Secondary | ICD-10-CM | POA: Diagnosis not present

## 2018-12-25 DIAGNOSIS — N2581 Secondary hyperparathyroidism of renal origin: Secondary | ICD-10-CM | POA: Diagnosis not present

## 2018-12-27 DIAGNOSIS — Z992 Dependence on renal dialysis: Secondary | ICD-10-CM | POA: Diagnosis not present

## 2018-12-27 DIAGNOSIS — D509 Iron deficiency anemia, unspecified: Secondary | ICD-10-CM | POA: Diagnosis not present

## 2018-12-27 DIAGNOSIS — N2581 Secondary hyperparathyroidism of renal origin: Secondary | ICD-10-CM | POA: Diagnosis not present

## 2018-12-27 DIAGNOSIS — N186 End stage renal disease: Secondary | ICD-10-CM | POA: Diagnosis not present

## 2018-12-27 DIAGNOSIS — I120 Hypertensive chronic kidney disease with stage 5 chronic kidney disease or end stage renal disease: Secondary | ICD-10-CM | POA: Diagnosis not present

## 2018-12-27 DIAGNOSIS — D631 Anemia in chronic kidney disease: Secondary | ICD-10-CM | POA: Diagnosis not present

## 2018-12-30 DIAGNOSIS — N186 End stage renal disease: Secondary | ICD-10-CM | POA: Diagnosis not present

## 2018-12-30 DIAGNOSIS — D509 Iron deficiency anemia, unspecified: Secondary | ICD-10-CM | POA: Diagnosis not present

## 2018-12-30 DIAGNOSIS — D631 Anemia in chronic kidney disease: Secondary | ICD-10-CM | POA: Diagnosis not present

## 2018-12-30 DIAGNOSIS — Z992 Dependence on renal dialysis: Secondary | ICD-10-CM | POA: Diagnosis not present

## 2018-12-30 DIAGNOSIS — N2581 Secondary hyperparathyroidism of renal origin: Secondary | ICD-10-CM | POA: Diagnosis not present

## 2019-01-01 DIAGNOSIS — N2581 Secondary hyperparathyroidism of renal origin: Secondary | ICD-10-CM | POA: Diagnosis not present

## 2019-01-01 DIAGNOSIS — D509 Iron deficiency anemia, unspecified: Secondary | ICD-10-CM | POA: Diagnosis not present

## 2019-01-01 DIAGNOSIS — N186 End stage renal disease: Secondary | ICD-10-CM | POA: Diagnosis not present

## 2019-01-01 DIAGNOSIS — D631 Anemia in chronic kidney disease: Secondary | ICD-10-CM | POA: Diagnosis not present

## 2019-01-01 DIAGNOSIS — Z992 Dependence on renal dialysis: Secondary | ICD-10-CM | POA: Diagnosis not present

## 2019-01-03 DIAGNOSIS — N2581 Secondary hyperparathyroidism of renal origin: Secondary | ICD-10-CM | POA: Diagnosis not present

## 2019-01-03 DIAGNOSIS — D631 Anemia in chronic kidney disease: Secondary | ICD-10-CM | POA: Diagnosis not present

## 2019-01-03 DIAGNOSIS — Z992 Dependence on renal dialysis: Secondary | ICD-10-CM | POA: Diagnosis not present

## 2019-01-03 DIAGNOSIS — N186 End stage renal disease: Secondary | ICD-10-CM | POA: Diagnosis not present

## 2019-01-03 DIAGNOSIS — D509 Iron deficiency anemia, unspecified: Secondary | ICD-10-CM | POA: Diagnosis not present

## 2019-01-06 DIAGNOSIS — N2581 Secondary hyperparathyroidism of renal origin: Secondary | ICD-10-CM | POA: Diagnosis not present

## 2019-01-06 DIAGNOSIS — D631 Anemia in chronic kidney disease: Secondary | ICD-10-CM | POA: Diagnosis not present

## 2019-01-06 DIAGNOSIS — Z992 Dependence on renal dialysis: Secondary | ICD-10-CM | POA: Diagnosis not present

## 2019-01-06 DIAGNOSIS — D509 Iron deficiency anemia, unspecified: Secondary | ICD-10-CM | POA: Diagnosis not present

## 2019-01-06 DIAGNOSIS — N186 End stage renal disease: Secondary | ICD-10-CM | POA: Diagnosis not present

## 2019-01-08 DIAGNOSIS — D509 Iron deficiency anemia, unspecified: Secondary | ICD-10-CM | POA: Diagnosis not present

## 2019-01-08 DIAGNOSIS — Z992 Dependence on renal dialysis: Secondary | ICD-10-CM | POA: Diagnosis not present

## 2019-01-08 DIAGNOSIS — D631 Anemia in chronic kidney disease: Secondary | ICD-10-CM | POA: Diagnosis not present

## 2019-01-08 DIAGNOSIS — N2581 Secondary hyperparathyroidism of renal origin: Secondary | ICD-10-CM | POA: Diagnosis not present

## 2019-01-08 DIAGNOSIS — N186 End stage renal disease: Secondary | ICD-10-CM | POA: Diagnosis not present

## 2019-01-10 DIAGNOSIS — N2581 Secondary hyperparathyroidism of renal origin: Secondary | ICD-10-CM | POA: Diagnosis not present

## 2019-01-10 DIAGNOSIS — Z992 Dependence on renal dialysis: Secondary | ICD-10-CM | POA: Diagnosis not present

## 2019-01-10 DIAGNOSIS — D509 Iron deficiency anemia, unspecified: Secondary | ICD-10-CM | POA: Diagnosis not present

## 2019-01-10 DIAGNOSIS — D631 Anemia in chronic kidney disease: Secondary | ICD-10-CM | POA: Diagnosis not present

## 2019-01-10 DIAGNOSIS — N186 End stage renal disease: Secondary | ICD-10-CM | POA: Diagnosis not present

## 2019-01-13 DIAGNOSIS — D509 Iron deficiency anemia, unspecified: Secondary | ICD-10-CM | POA: Diagnosis not present

## 2019-01-13 DIAGNOSIS — N2581 Secondary hyperparathyroidism of renal origin: Secondary | ICD-10-CM | POA: Diagnosis not present

## 2019-01-13 DIAGNOSIS — N186 End stage renal disease: Secondary | ICD-10-CM | POA: Diagnosis not present

## 2019-01-13 DIAGNOSIS — Z992 Dependence on renal dialysis: Secondary | ICD-10-CM | POA: Diagnosis not present

## 2019-01-13 DIAGNOSIS — D631 Anemia in chronic kidney disease: Secondary | ICD-10-CM | POA: Diagnosis not present

## 2019-01-15 DIAGNOSIS — D631 Anemia in chronic kidney disease: Secondary | ICD-10-CM | POA: Diagnosis not present

## 2019-01-15 DIAGNOSIS — Z992 Dependence on renal dialysis: Secondary | ICD-10-CM | POA: Diagnosis not present

## 2019-01-15 DIAGNOSIS — N2581 Secondary hyperparathyroidism of renal origin: Secondary | ICD-10-CM | POA: Diagnosis not present

## 2019-01-15 DIAGNOSIS — N186 End stage renal disease: Secondary | ICD-10-CM | POA: Diagnosis not present

## 2019-01-15 DIAGNOSIS — D509 Iron deficiency anemia, unspecified: Secondary | ICD-10-CM | POA: Diagnosis not present

## 2019-01-17 DIAGNOSIS — D509 Iron deficiency anemia, unspecified: Secondary | ICD-10-CM | POA: Diagnosis not present

## 2019-01-17 DIAGNOSIS — N2581 Secondary hyperparathyroidism of renal origin: Secondary | ICD-10-CM | POA: Diagnosis not present

## 2019-01-17 DIAGNOSIS — N186 End stage renal disease: Secondary | ICD-10-CM | POA: Diagnosis not present

## 2019-01-17 DIAGNOSIS — Z992 Dependence on renal dialysis: Secondary | ICD-10-CM | POA: Diagnosis not present

## 2019-01-17 DIAGNOSIS — D631 Anemia in chronic kidney disease: Secondary | ICD-10-CM | POA: Diagnosis not present

## 2019-01-20 DIAGNOSIS — Z992 Dependence on renal dialysis: Secondary | ICD-10-CM | POA: Diagnosis not present

## 2019-01-20 DIAGNOSIS — D631 Anemia in chronic kidney disease: Secondary | ICD-10-CM | POA: Diagnosis not present

## 2019-01-20 DIAGNOSIS — N2581 Secondary hyperparathyroidism of renal origin: Secondary | ICD-10-CM | POA: Diagnosis not present

## 2019-01-20 DIAGNOSIS — N186 End stage renal disease: Secondary | ICD-10-CM | POA: Diagnosis not present

## 2019-01-20 DIAGNOSIS — D509 Iron deficiency anemia, unspecified: Secondary | ICD-10-CM | POA: Diagnosis not present

## 2019-01-22 DIAGNOSIS — D631 Anemia in chronic kidney disease: Secondary | ICD-10-CM | POA: Diagnosis not present

## 2019-01-22 DIAGNOSIS — Z992 Dependence on renal dialysis: Secondary | ICD-10-CM | POA: Diagnosis not present

## 2019-01-22 DIAGNOSIS — D509 Iron deficiency anemia, unspecified: Secondary | ICD-10-CM | POA: Diagnosis not present

## 2019-01-22 DIAGNOSIS — N2581 Secondary hyperparathyroidism of renal origin: Secondary | ICD-10-CM | POA: Diagnosis not present

## 2019-01-22 DIAGNOSIS — N186 End stage renal disease: Secondary | ICD-10-CM | POA: Diagnosis not present

## 2019-01-24 DIAGNOSIS — D509 Iron deficiency anemia, unspecified: Secondary | ICD-10-CM | POA: Diagnosis not present

## 2019-01-24 DIAGNOSIS — N2581 Secondary hyperparathyroidism of renal origin: Secondary | ICD-10-CM | POA: Diagnosis not present

## 2019-01-24 DIAGNOSIS — Z992 Dependence on renal dialysis: Secondary | ICD-10-CM | POA: Diagnosis not present

## 2019-01-24 DIAGNOSIS — D631 Anemia in chronic kidney disease: Secondary | ICD-10-CM | POA: Diagnosis not present

## 2019-01-24 DIAGNOSIS — N186 End stage renal disease: Secondary | ICD-10-CM | POA: Diagnosis not present

## 2019-01-27 DIAGNOSIS — N186 End stage renal disease: Secondary | ICD-10-CM | POA: Diagnosis not present

## 2019-01-27 DIAGNOSIS — I120 Hypertensive chronic kidney disease with stage 5 chronic kidney disease or end stage renal disease: Secondary | ICD-10-CM | POA: Diagnosis not present

## 2019-01-27 DIAGNOSIS — D509 Iron deficiency anemia, unspecified: Secondary | ICD-10-CM | POA: Diagnosis not present

## 2019-01-27 DIAGNOSIS — D631 Anemia in chronic kidney disease: Secondary | ICD-10-CM | POA: Diagnosis not present

## 2019-01-27 DIAGNOSIS — N2581 Secondary hyperparathyroidism of renal origin: Secondary | ICD-10-CM | POA: Diagnosis not present

## 2019-01-27 DIAGNOSIS — Z992 Dependence on renal dialysis: Secondary | ICD-10-CM | POA: Diagnosis not present

## 2019-01-29 DIAGNOSIS — Z992 Dependence on renal dialysis: Secondary | ICD-10-CM | POA: Diagnosis not present

## 2019-01-29 DIAGNOSIS — N2581 Secondary hyperparathyroidism of renal origin: Secondary | ICD-10-CM | POA: Diagnosis not present

## 2019-01-29 DIAGNOSIS — N186 End stage renal disease: Secondary | ICD-10-CM | POA: Diagnosis not present

## 2019-01-29 DIAGNOSIS — D631 Anemia in chronic kidney disease: Secondary | ICD-10-CM | POA: Diagnosis not present

## 2019-01-29 DIAGNOSIS — D509 Iron deficiency anemia, unspecified: Secondary | ICD-10-CM | POA: Diagnosis not present

## 2019-01-31 DIAGNOSIS — N186 End stage renal disease: Secondary | ICD-10-CM | POA: Diagnosis not present

## 2019-01-31 DIAGNOSIS — N2581 Secondary hyperparathyroidism of renal origin: Secondary | ICD-10-CM | POA: Diagnosis not present

## 2019-01-31 DIAGNOSIS — Z992 Dependence on renal dialysis: Secondary | ICD-10-CM | POA: Diagnosis not present

## 2019-01-31 DIAGNOSIS — D509 Iron deficiency anemia, unspecified: Secondary | ICD-10-CM | POA: Diagnosis not present

## 2019-01-31 DIAGNOSIS — D631 Anemia in chronic kidney disease: Secondary | ICD-10-CM | POA: Diagnosis not present

## 2019-02-03 DIAGNOSIS — Z992 Dependence on renal dialysis: Secondary | ICD-10-CM | POA: Diagnosis not present

## 2019-02-03 DIAGNOSIS — N2581 Secondary hyperparathyroidism of renal origin: Secondary | ICD-10-CM | POA: Diagnosis not present

## 2019-02-03 DIAGNOSIS — D631 Anemia in chronic kidney disease: Secondary | ICD-10-CM | POA: Diagnosis not present

## 2019-02-03 DIAGNOSIS — D509 Iron deficiency anemia, unspecified: Secondary | ICD-10-CM | POA: Diagnosis not present

## 2019-02-03 DIAGNOSIS — N186 End stage renal disease: Secondary | ICD-10-CM | POA: Diagnosis not present

## 2019-02-05 DIAGNOSIS — D631 Anemia in chronic kidney disease: Secondary | ICD-10-CM | POA: Diagnosis not present

## 2019-02-05 DIAGNOSIS — Z992 Dependence on renal dialysis: Secondary | ICD-10-CM | POA: Diagnosis not present

## 2019-02-05 DIAGNOSIS — N2581 Secondary hyperparathyroidism of renal origin: Secondary | ICD-10-CM | POA: Diagnosis not present

## 2019-02-05 DIAGNOSIS — D509 Iron deficiency anemia, unspecified: Secondary | ICD-10-CM | POA: Diagnosis not present

## 2019-02-05 DIAGNOSIS — N186 End stage renal disease: Secondary | ICD-10-CM | POA: Diagnosis not present

## 2019-02-07 DIAGNOSIS — N2581 Secondary hyperparathyroidism of renal origin: Secondary | ICD-10-CM | POA: Diagnosis not present

## 2019-02-07 DIAGNOSIS — N186 End stage renal disease: Secondary | ICD-10-CM | POA: Diagnosis not present

## 2019-02-07 DIAGNOSIS — D509 Iron deficiency anemia, unspecified: Secondary | ICD-10-CM | POA: Diagnosis not present

## 2019-02-07 DIAGNOSIS — Z992 Dependence on renal dialysis: Secondary | ICD-10-CM | POA: Diagnosis not present

## 2019-02-07 DIAGNOSIS — D631 Anemia in chronic kidney disease: Secondary | ICD-10-CM | POA: Diagnosis not present

## 2019-02-10 DIAGNOSIS — N2581 Secondary hyperparathyroidism of renal origin: Secondary | ICD-10-CM | POA: Diagnosis not present

## 2019-02-10 DIAGNOSIS — D631 Anemia in chronic kidney disease: Secondary | ICD-10-CM | POA: Diagnosis not present

## 2019-02-10 DIAGNOSIS — N186 End stage renal disease: Secondary | ICD-10-CM | POA: Diagnosis not present

## 2019-02-10 DIAGNOSIS — Z992 Dependence on renal dialysis: Secondary | ICD-10-CM | POA: Diagnosis not present

## 2019-02-10 DIAGNOSIS — D509 Iron deficiency anemia, unspecified: Secondary | ICD-10-CM | POA: Diagnosis not present

## 2019-02-12 DIAGNOSIS — Z992 Dependence on renal dialysis: Secondary | ICD-10-CM | POA: Diagnosis not present

## 2019-02-12 DIAGNOSIS — D631 Anemia in chronic kidney disease: Secondary | ICD-10-CM | POA: Diagnosis not present

## 2019-02-12 DIAGNOSIS — D509 Iron deficiency anemia, unspecified: Secondary | ICD-10-CM | POA: Diagnosis not present

## 2019-02-12 DIAGNOSIS — N186 End stage renal disease: Secondary | ICD-10-CM | POA: Diagnosis not present

## 2019-02-12 DIAGNOSIS — N2581 Secondary hyperparathyroidism of renal origin: Secondary | ICD-10-CM | POA: Diagnosis not present

## 2019-02-14 DIAGNOSIS — D509 Iron deficiency anemia, unspecified: Secondary | ICD-10-CM | POA: Diagnosis not present

## 2019-02-14 DIAGNOSIS — N186 End stage renal disease: Secondary | ICD-10-CM | POA: Diagnosis not present

## 2019-02-14 DIAGNOSIS — N2581 Secondary hyperparathyroidism of renal origin: Secondary | ICD-10-CM | POA: Diagnosis not present

## 2019-02-14 DIAGNOSIS — Z992 Dependence on renal dialysis: Secondary | ICD-10-CM | POA: Diagnosis not present

## 2019-02-14 DIAGNOSIS — D631 Anemia in chronic kidney disease: Secondary | ICD-10-CM | POA: Diagnosis not present

## 2019-02-17 DIAGNOSIS — N2581 Secondary hyperparathyroidism of renal origin: Secondary | ICD-10-CM | POA: Diagnosis not present

## 2019-02-17 DIAGNOSIS — N186 End stage renal disease: Secondary | ICD-10-CM | POA: Diagnosis not present

## 2019-02-17 DIAGNOSIS — D509 Iron deficiency anemia, unspecified: Secondary | ICD-10-CM | POA: Diagnosis not present

## 2019-02-17 DIAGNOSIS — D631 Anemia in chronic kidney disease: Secondary | ICD-10-CM | POA: Diagnosis not present

## 2019-02-17 DIAGNOSIS — Z992 Dependence on renal dialysis: Secondary | ICD-10-CM | POA: Diagnosis not present

## 2019-02-19 DIAGNOSIS — D509 Iron deficiency anemia, unspecified: Secondary | ICD-10-CM | POA: Diagnosis not present

## 2019-02-19 DIAGNOSIS — D631 Anemia in chronic kidney disease: Secondary | ICD-10-CM | POA: Diagnosis not present

## 2019-02-19 DIAGNOSIS — N2581 Secondary hyperparathyroidism of renal origin: Secondary | ICD-10-CM | POA: Diagnosis not present

## 2019-02-19 DIAGNOSIS — N186 End stage renal disease: Secondary | ICD-10-CM | POA: Diagnosis not present

## 2019-02-19 DIAGNOSIS — Z992 Dependence on renal dialysis: Secondary | ICD-10-CM | POA: Diagnosis not present

## 2019-02-21 DIAGNOSIS — D509 Iron deficiency anemia, unspecified: Secondary | ICD-10-CM | POA: Diagnosis not present

## 2019-02-21 DIAGNOSIS — N186 End stage renal disease: Secondary | ICD-10-CM | POA: Diagnosis not present

## 2019-02-21 DIAGNOSIS — N2581 Secondary hyperparathyroidism of renal origin: Secondary | ICD-10-CM | POA: Diagnosis not present

## 2019-02-21 DIAGNOSIS — Z992 Dependence on renal dialysis: Secondary | ICD-10-CM | POA: Diagnosis not present

## 2019-02-21 DIAGNOSIS — D631 Anemia in chronic kidney disease: Secondary | ICD-10-CM | POA: Diagnosis not present

## 2019-02-24 DIAGNOSIS — D631 Anemia in chronic kidney disease: Secondary | ICD-10-CM | POA: Diagnosis not present

## 2019-02-24 DIAGNOSIS — N2581 Secondary hyperparathyroidism of renal origin: Secondary | ICD-10-CM | POA: Diagnosis not present

## 2019-02-24 DIAGNOSIS — Z992 Dependence on renal dialysis: Secondary | ICD-10-CM | POA: Diagnosis not present

## 2019-02-24 DIAGNOSIS — N186 End stage renal disease: Secondary | ICD-10-CM | POA: Diagnosis not present

## 2019-02-24 DIAGNOSIS — D509 Iron deficiency anemia, unspecified: Secondary | ICD-10-CM | POA: Diagnosis not present

## 2019-02-26 DIAGNOSIS — E8779 Other fluid overload: Secondary | ICD-10-CM | POA: Diagnosis not present

## 2019-02-26 DIAGNOSIS — D631 Anemia in chronic kidney disease: Secondary | ICD-10-CM | POA: Diagnosis not present

## 2019-02-26 DIAGNOSIS — N2581 Secondary hyperparathyroidism of renal origin: Secondary | ICD-10-CM | POA: Diagnosis not present

## 2019-02-26 DIAGNOSIS — N186 End stage renal disease: Secondary | ICD-10-CM | POA: Diagnosis not present

## 2019-02-26 DIAGNOSIS — Z992 Dependence on renal dialysis: Secondary | ICD-10-CM | POA: Diagnosis not present

## 2019-02-26 DIAGNOSIS — I120 Hypertensive chronic kidney disease with stage 5 chronic kidney disease or end stage renal disease: Secondary | ICD-10-CM | POA: Diagnosis not present

## 2019-02-26 DIAGNOSIS — D509 Iron deficiency anemia, unspecified: Secondary | ICD-10-CM | POA: Diagnosis not present

## 2019-02-28 DIAGNOSIS — D509 Iron deficiency anemia, unspecified: Secondary | ICD-10-CM | POA: Diagnosis not present

## 2019-02-28 DIAGNOSIS — N2581 Secondary hyperparathyroidism of renal origin: Secondary | ICD-10-CM | POA: Diagnosis not present

## 2019-02-28 DIAGNOSIS — N186 End stage renal disease: Secondary | ICD-10-CM | POA: Diagnosis not present

## 2019-02-28 DIAGNOSIS — E8779 Other fluid overload: Secondary | ICD-10-CM | POA: Diagnosis not present

## 2019-02-28 DIAGNOSIS — Z992 Dependence on renal dialysis: Secondary | ICD-10-CM | POA: Diagnosis not present

## 2019-02-28 DIAGNOSIS — D631 Anemia in chronic kidney disease: Secondary | ICD-10-CM | POA: Diagnosis not present

## 2019-03-03 DIAGNOSIS — E8779 Other fluid overload: Secondary | ICD-10-CM | POA: Diagnosis not present

## 2019-03-03 DIAGNOSIS — Z992 Dependence on renal dialysis: Secondary | ICD-10-CM | POA: Diagnosis not present

## 2019-03-03 DIAGNOSIS — N2581 Secondary hyperparathyroidism of renal origin: Secondary | ICD-10-CM | POA: Diagnosis not present

## 2019-03-03 DIAGNOSIS — D509 Iron deficiency anemia, unspecified: Secondary | ICD-10-CM | POA: Diagnosis not present

## 2019-03-03 DIAGNOSIS — D631 Anemia in chronic kidney disease: Secondary | ICD-10-CM | POA: Diagnosis not present

## 2019-03-03 DIAGNOSIS — N186 End stage renal disease: Secondary | ICD-10-CM | POA: Diagnosis not present

## 2019-03-05 DIAGNOSIS — Z992 Dependence on renal dialysis: Secondary | ICD-10-CM | POA: Diagnosis not present

## 2019-03-05 DIAGNOSIS — E8779 Other fluid overload: Secondary | ICD-10-CM | POA: Diagnosis not present

## 2019-03-05 DIAGNOSIS — N2581 Secondary hyperparathyroidism of renal origin: Secondary | ICD-10-CM | POA: Diagnosis not present

## 2019-03-05 DIAGNOSIS — D631 Anemia in chronic kidney disease: Secondary | ICD-10-CM | POA: Diagnosis not present

## 2019-03-05 DIAGNOSIS — N186 End stage renal disease: Secondary | ICD-10-CM | POA: Diagnosis not present

## 2019-03-05 DIAGNOSIS — D509 Iron deficiency anemia, unspecified: Secondary | ICD-10-CM | POA: Diagnosis not present

## 2019-03-06 DIAGNOSIS — E8779 Other fluid overload: Secondary | ICD-10-CM | POA: Diagnosis not present

## 2019-03-06 DIAGNOSIS — N2581 Secondary hyperparathyroidism of renal origin: Secondary | ICD-10-CM | POA: Diagnosis not present

## 2019-03-06 DIAGNOSIS — D509 Iron deficiency anemia, unspecified: Secondary | ICD-10-CM | POA: Diagnosis not present

## 2019-03-06 DIAGNOSIS — Z992 Dependence on renal dialysis: Secondary | ICD-10-CM | POA: Diagnosis not present

## 2019-03-06 DIAGNOSIS — D631 Anemia in chronic kidney disease: Secondary | ICD-10-CM | POA: Diagnosis not present

## 2019-03-06 DIAGNOSIS — N186 End stage renal disease: Secondary | ICD-10-CM | POA: Diagnosis not present

## 2019-03-07 DIAGNOSIS — E8779 Other fluid overload: Secondary | ICD-10-CM | POA: Diagnosis not present

## 2019-03-07 DIAGNOSIS — D631 Anemia in chronic kidney disease: Secondary | ICD-10-CM | POA: Diagnosis not present

## 2019-03-07 DIAGNOSIS — Z992 Dependence on renal dialysis: Secondary | ICD-10-CM | POA: Diagnosis not present

## 2019-03-07 DIAGNOSIS — N2581 Secondary hyperparathyroidism of renal origin: Secondary | ICD-10-CM | POA: Diagnosis not present

## 2019-03-07 DIAGNOSIS — D509 Iron deficiency anemia, unspecified: Secondary | ICD-10-CM | POA: Diagnosis not present

## 2019-03-07 DIAGNOSIS — N186 End stage renal disease: Secondary | ICD-10-CM | POA: Diagnosis not present

## 2019-03-10 DIAGNOSIS — D509 Iron deficiency anemia, unspecified: Secondary | ICD-10-CM | POA: Diagnosis not present

## 2019-03-10 DIAGNOSIS — N186 End stage renal disease: Secondary | ICD-10-CM | POA: Diagnosis not present

## 2019-03-10 DIAGNOSIS — N2581 Secondary hyperparathyroidism of renal origin: Secondary | ICD-10-CM | POA: Diagnosis not present

## 2019-03-10 DIAGNOSIS — D631 Anemia in chronic kidney disease: Secondary | ICD-10-CM | POA: Diagnosis not present

## 2019-03-10 DIAGNOSIS — Z992 Dependence on renal dialysis: Secondary | ICD-10-CM | POA: Diagnosis not present

## 2019-03-10 DIAGNOSIS — E8779 Other fluid overload: Secondary | ICD-10-CM | POA: Diagnosis not present

## 2019-03-11 DIAGNOSIS — R0989 Other specified symptoms and signs involving the circulatory and respiratory systems: Secondary | ICD-10-CM | POA: Diagnosis not present

## 2019-03-11 DIAGNOSIS — K219 Gastro-esophageal reflux disease without esophagitis: Secondary | ICD-10-CM | POA: Diagnosis not present

## 2019-03-11 DIAGNOSIS — N186 End stage renal disease: Secondary | ICD-10-CM | POA: Diagnosis not present

## 2019-03-11 DIAGNOSIS — F17200 Nicotine dependence, unspecified, uncomplicated: Secondary | ICD-10-CM | POA: Diagnosis not present

## 2019-03-12 DIAGNOSIS — E8779 Other fluid overload: Secondary | ICD-10-CM | POA: Diagnosis not present

## 2019-03-12 DIAGNOSIS — Z992 Dependence on renal dialysis: Secondary | ICD-10-CM | POA: Diagnosis not present

## 2019-03-12 DIAGNOSIS — D509 Iron deficiency anemia, unspecified: Secondary | ICD-10-CM | POA: Diagnosis not present

## 2019-03-12 DIAGNOSIS — N2581 Secondary hyperparathyroidism of renal origin: Secondary | ICD-10-CM | POA: Diagnosis not present

## 2019-03-12 DIAGNOSIS — D631 Anemia in chronic kidney disease: Secondary | ICD-10-CM | POA: Diagnosis not present

## 2019-03-12 DIAGNOSIS — N186 End stage renal disease: Secondary | ICD-10-CM | POA: Diagnosis not present

## 2019-03-14 DIAGNOSIS — E8779 Other fluid overload: Secondary | ICD-10-CM | POA: Diagnosis not present

## 2019-03-14 DIAGNOSIS — N2581 Secondary hyperparathyroidism of renal origin: Secondary | ICD-10-CM | POA: Diagnosis not present

## 2019-03-14 DIAGNOSIS — D631 Anemia in chronic kidney disease: Secondary | ICD-10-CM | POA: Diagnosis not present

## 2019-03-14 DIAGNOSIS — Z992 Dependence on renal dialysis: Secondary | ICD-10-CM | POA: Diagnosis not present

## 2019-03-14 DIAGNOSIS — D509 Iron deficiency anemia, unspecified: Secondary | ICD-10-CM | POA: Diagnosis not present

## 2019-03-14 DIAGNOSIS — N186 End stage renal disease: Secondary | ICD-10-CM | POA: Diagnosis not present

## 2019-03-17 DIAGNOSIS — D509 Iron deficiency anemia, unspecified: Secondary | ICD-10-CM | POA: Diagnosis not present

## 2019-03-17 DIAGNOSIS — D631 Anemia in chronic kidney disease: Secondary | ICD-10-CM | POA: Diagnosis not present

## 2019-03-17 DIAGNOSIS — N186 End stage renal disease: Secondary | ICD-10-CM | POA: Diagnosis not present

## 2019-03-17 DIAGNOSIS — N2581 Secondary hyperparathyroidism of renal origin: Secondary | ICD-10-CM | POA: Diagnosis not present

## 2019-03-17 DIAGNOSIS — E8779 Other fluid overload: Secondary | ICD-10-CM | POA: Diagnosis not present

## 2019-03-17 DIAGNOSIS — Z992 Dependence on renal dialysis: Secondary | ICD-10-CM | POA: Diagnosis not present

## 2019-03-18 DIAGNOSIS — R1313 Dysphagia, pharyngeal phase: Secondary | ICD-10-CM | POA: Diagnosis not present

## 2019-03-19 DIAGNOSIS — N186 End stage renal disease: Secondary | ICD-10-CM | POA: Diagnosis not present

## 2019-03-19 DIAGNOSIS — E8779 Other fluid overload: Secondary | ICD-10-CM | POA: Diagnosis not present

## 2019-03-19 DIAGNOSIS — D509 Iron deficiency anemia, unspecified: Secondary | ICD-10-CM | POA: Diagnosis not present

## 2019-03-19 DIAGNOSIS — Z992 Dependence on renal dialysis: Secondary | ICD-10-CM | POA: Diagnosis not present

## 2019-03-19 DIAGNOSIS — D631 Anemia in chronic kidney disease: Secondary | ICD-10-CM | POA: Diagnosis not present

## 2019-03-19 DIAGNOSIS — N2581 Secondary hyperparathyroidism of renal origin: Secondary | ICD-10-CM | POA: Diagnosis not present

## 2019-03-21 DIAGNOSIS — D631 Anemia in chronic kidney disease: Secondary | ICD-10-CM | POA: Diagnosis not present

## 2019-03-21 DIAGNOSIS — D509 Iron deficiency anemia, unspecified: Secondary | ICD-10-CM | POA: Diagnosis not present

## 2019-03-21 DIAGNOSIS — N186 End stage renal disease: Secondary | ICD-10-CM | POA: Diagnosis not present

## 2019-03-21 DIAGNOSIS — E8779 Other fluid overload: Secondary | ICD-10-CM | POA: Diagnosis not present

## 2019-03-21 DIAGNOSIS — N2581 Secondary hyperparathyroidism of renal origin: Secondary | ICD-10-CM | POA: Diagnosis not present

## 2019-03-21 DIAGNOSIS — Z992 Dependence on renal dialysis: Secondary | ICD-10-CM | POA: Diagnosis not present

## 2019-03-24 DIAGNOSIS — Z992 Dependence on renal dialysis: Secondary | ICD-10-CM | POA: Diagnosis not present

## 2019-03-24 DIAGNOSIS — E8779 Other fluid overload: Secondary | ICD-10-CM | POA: Diagnosis not present

## 2019-03-24 DIAGNOSIS — D509 Iron deficiency anemia, unspecified: Secondary | ICD-10-CM | POA: Diagnosis not present

## 2019-03-24 DIAGNOSIS — D631 Anemia in chronic kidney disease: Secondary | ICD-10-CM | POA: Diagnosis not present

## 2019-03-24 DIAGNOSIS — N186 End stage renal disease: Secondary | ICD-10-CM | POA: Diagnosis not present

## 2019-03-24 DIAGNOSIS — N2581 Secondary hyperparathyroidism of renal origin: Secondary | ICD-10-CM | POA: Diagnosis not present

## 2019-03-26 DIAGNOSIS — D509 Iron deficiency anemia, unspecified: Secondary | ICD-10-CM | POA: Diagnosis not present

## 2019-03-26 DIAGNOSIS — N2581 Secondary hyperparathyroidism of renal origin: Secondary | ICD-10-CM | POA: Diagnosis not present

## 2019-03-26 DIAGNOSIS — E8779 Other fluid overload: Secondary | ICD-10-CM | POA: Diagnosis not present

## 2019-03-26 DIAGNOSIS — D631 Anemia in chronic kidney disease: Secondary | ICD-10-CM | POA: Diagnosis not present

## 2019-03-26 DIAGNOSIS — Z992 Dependence on renal dialysis: Secondary | ICD-10-CM | POA: Diagnosis not present

## 2019-03-26 DIAGNOSIS — N186 End stage renal disease: Secondary | ICD-10-CM | POA: Diagnosis not present

## 2019-03-28 DIAGNOSIS — D509 Iron deficiency anemia, unspecified: Secondary | ICD-10-CM | POA: Diagnosis not present

## 2019-03-28 DIAGNOSIS — D631 Anemia in chronic kidney disease: Secondary | ICD-10-CM | POA: Diagnosis not present

## 2019-03-28 DIAGNOSIS — Z992 Dependence on renal dialysis: Secondary | ICD-10-CM | POA: Diagnosis not present

## 2019-03-28 DIAGNOSIS — N2581 Secondary hyperparathyroidism of renal origin: Secondary | ICD-10-CM | POA: Diagnosis not present

## 2019-03-28 DIAGNOSIS — N186 End stage renal disease: Secondary | ICD-10-CM | POA: Diagnosis not present

## 2019-03-28 DIAGNOSIS — E8779 Other fluid overload: Secondary | ICD-10-CM | POA: Diagnosis not present

## 2019-03-29 DIAGNOSIS — N186 End stage renal disease: Secondary | ICD-10-CM | POA: Diagnosis not present

## 2019-03-29 DIAGNOSIS — Z992 Dependence on renal dialysis: Secondary | ICD-10-CM | POA: Diagnosis not present

## 2019-03-29 DIAGNOSIS — I120 Hypertensive chronic kidney disease with stage 5 chronic kidney disease or end stage renal disease: Secondary | ICD-10-CM | POA: Diagnosis not present

## 2019-03-31 ENCOUNTER — Emergency Department (HOSPITAL_COMMUNITY)
Admission: EM | Admit: 2019-03-31 | Discharge: 2019-03-31 | Disposition: A | Discharge status: Home / Self Care | Payer: Medicare Other | Attending: Emergency Medicine | Admitting: Emergency Medicine

## 2019-03-31 ENCOUNTER — Encounter (HOSPITAL_COMMUNITY): Payer: Self-pay | Admitting: Emergency Medicine

## 2019-03-31 ENCOUNTER — Other Ambulatory Visit: Payer: Self-pay

## 2019-03-31 DIAGNOSIS — E8779 Other fluid overload: Secondary | ICD-10-CM | POA: Diagnosis not present

## 2019-03-31 DIAGNOSIS — R011 Cardiac murmur, unspecified: Secondary | ICD-10-CM | POA: Diagnosis not present

## 2019-03-31 DIAGNOSIS — N186 End stage renal disease: Secondary | ICD-10-CM | POA: Diagnosis not present

## 2019-03-31 DIAGNOSIS — F17210 Nicotine dependence, cigarettes, uncomplicated: Secondary | ICD-10-CM | POA: Insufficient documentation

## 2019-03-31 DIAGNOSIS — I120 Hypertensive chronic kidney disease with stage 5 chronic kidney disease or end stage renal disease: Secondary | ICD-10-CM | POA: Insufficient documentation

## 2019-03-31 DIAGNOSIS — D509 Iron deficiency anemia, unspecified: Secondary | ICD-10-CM | POA: Diagnosis not present

## 2019-03-31 DIAGNOSIS — N2581 Secondary hyperparathyroidism of renal origin: Secondary | ICD-10-CM | POA: Diagnosis not present

## 2019-03-31 DIAGNOSIS — Z79899 Other long term (current) drug therapy: Secondary | ICD-10-CM | POA: Insufficient documentation

## 2019-03-31 DIAGNOSIS — D631 Anemia in chronic kidney disease: Secondary | ICD-10-CM | POA: Diagnosis not present

## 2019-03-31 DIAGNOSIS — R42 Dizziness and giddiness: Secondary | ICD-10-CM | POA: Diagnosis not present

## 2019-03-31 DIAGNOSIS — Z992 Dependence on renal dialysis: Secondary | ICD-10-CM | POA: Diagnosis not present

## 2019-03-31 DIAGNOSIS — S41102A Unspecified open wound of left upper arm, initial encounter: Secondary | ICD-10-CM | POA: Diagnosis not present

## 2019-03-31 NOTE — ED Provider Notes (Signed)
[facility] EMERGENCY DEPARTMENT Provider Note   CSN: [id number] Arrival date & time: 03/31/19  1308    History   Chief Complaint No chief complaint on file.   HPI Marco Arnold is a 53 y.o. male with history of HTN and ESRD on dialysis MWF presents to the ED complaining of an episode of dizziness and generalized weakness around 2 AM this morning.  Patient reports that during the episode he felt off balance, had an abnormal gait, and had numbness in the soles of both feet and in the third and fourth fingertips of his left hand.  Patient reports the symptoms lasted for approximately 20 minutes.  He reports he was having some residual gait issues when he arrived for dialysis today but reports his symptoms improved after dialysis.  He reports he has felt at his baseline since then.  He reports his gait has returned to normal.  He denies any focal weakness, recent illness, fever, chills, cough, shortness of breath, chest pain, abdominal pain, nausea, vomiting, diarrhea, urinary complaints.     The history is provided by the patient.    Past Medical History:  Diagnosis Date  . Arthritis    spine  . Chronic kidney disease    [place] MWF  . Dialysis patient [facility]) 2013  . Diverticulitis   . Hypertension    dr  Tobie Lords      @ [facility]  med  . Neuropathic pain 09/20/2017   Left mid-thoracic    Patient Active Problem List   Diagnosis Date Noted  . Neuropathic pain 09/20/2017  . Thoracic disc herniation 03/29/2017  . Chronic bilateral low back pain without sciatica 03/29/2017  . Other sleep disturbances 12/05/2012  . End stage renal disease ([place]) 12/05/2011  . ESRD (end stage renal disease) ([place]) 11/11/2011  . Anemia of chronic disease 11/11/2011  . Hyperparathyroidism, secondary renal ([place]) 11/11/2011  . HTN (hypertension) 11/11/2011  . Acute kidney injury ([place]) 11/06/2011    Past Surgical History:  Procedure Laterality Date  . AV FISTULA PLACEMENT   11/10/2011   Procedure: ARTERIOVENOUS (AV) FISTULA CREATION;  Surgeon: Mal Misty, MD;  Location: [facility];  Service: Vascular;  Laterality: Left;  Ultrasound guided  . COLONOSCOPY    . INSERTION OF DIALYSIS CATHETER  02/09/2012   Procedure: INSERTION OF DIALYSIS CATHETER;  Surgeon: Mal Misty, MD;  Location: [place];  Service: Vascular;  Laterality: N/A;  . MASS EXCISION Right 09/07/2018   Procedure: EXCISION MASS RIGHT THUMB;  Surgeon: Charlotte Crumb, MD;  Location: [place];  Service: Orthopedics;  Laterality: Right;  OR AXILLARY BLOCK  . SHUNTOGRAM Left 06/03/2012   Procedure: SHUNTOGRAM;  Surgeon: Conrad Fallon, MD;  Location: [facility] CATH LAB;  Service: Cardiovascular;  Laterality: Left;  . SHUNTOGRAM Left 01/02/2013   Procedure: SHUNTOGRAM;  Surgeon: Conrad [place], MD;  Location: [facility] CATH LAB;  Service: Cardiovascular;  Laterality: Left;        Home Medications    Prior to Admission medications   Medication Sig Start Date End Date Taking? Authorizing Provider  amLODipine (NORVASC) 5 MG tablet Take 5 mg by mouth at bedtime.    [provider]  cephALEXin (KEFLEX) 500 MG capsule Take 500 mg by mouth daily.    [provider]  cloNIDine (CATAPRES) 0.1 MG tablet Take 0.2 mg by mouth daily. 07/13/16   [provider]  cyclobenzaprine (FLEXERIL) 10 MG tablet Take 20 mg by mouth 2 (two) times daily as  needed for muscle spasms.     [provider]  DULoxetine (CYMBALTA) 60 MG capsule Take 60 mg by mouth daily.  01/29/18   [provider]  FOSRENOL 1000 MG chewable tablet Chew 4,000 mg by mouth 3 (three) times daily with meals.  08/31/17   [provider]  gabapentin (NEURONTIN) 100 MG capsule Take 3 capsules (300 mg total) by mouth at bedtime. Patient not taking: Reported on 09/05/2018 09/20/17   Kathrynn Ducking, MD  losartan (COZAAR) 100 MG tablet Take 100 mg by mouth at bedtime.     [provider]  multivitamin (RENA-VIT) TABS tablet Take  1 tablet by mouth every evening.    [provider]  naproxen sodium (ALEVE) 220 MG tablet Take 220 mg by mouth daily as needed (pain).     [provider]  nortriptyline (PAMELOR) 25 MG capsule Take 25 mg by mouth at bedtime.  01/29/18   [provider]  pantoprazole (PROTONIX) 40 MG tablet Take 40 mg by mouth as needed (heartburn).  12/04/12   [provider]  sildenafil (VIAGRA) 25 MG tablet Take 25 mg by mouth daily as needed for erectile dysfunction.    [provider]  VENTOLIN HFA 108 (90 Base) MCG/ACT inhaler Inhale 2 puffs into the lungs every 6 (six) hours as needed for wheezing or shortness of breath.  12/21/17   [provider]    Family History Family History  Problem Relation Age of Onset  . Pancreatic cancer Mother   . Diabetes Father   . Hypertension Father   . Cancer - Lung Paternal Grandfather     Social History Social History   Tobacco Use  . Smoking status: Current Every Day Smoker    Packs/day: 0.12    Years: 43.00    Pack years: 5.16    Types: Cigarettes  . Smokeless tobacco: Never Used  . Tobacco comment: pt states that he is trying to quit--is trying to cut back at the current time  Substance Use Topics  . Alcohol use: Yes    Comment: occ- rare  . Drug use: Yes    Types: Marijuana    Comment: not currently     Allergies   Lisinopril   Review of Systems Review of Systems  Constitutional: Negative for chills and fever.  HENT: Negative for ear pain and sore throat.   Eyes: Negative for pain and visual disturbance.  Respiratory: Negative for cough and shortness of breath.   Cardiovascular: Negative for chest pain and palpitations.  Gastrointestinal: Negative for abdominal pain and vomiting.  Genitourinary: Negative for dysuria and hematuria.  Musculoskeletal: Positive for gait problem. Negative for arthralgias and back pain.  Skin: Negative for color change and rash.  Neurological: Positive for  dizziness and numbness. Negative for seizures and syncope.  All other systems reviewed and are negative.    Physical Exam Updated Vital Signs BP (!) 151/98   Pulse 78   Temp 97.7 F (36.5 C) (Oral)   Resp 17   Ht 6\' 3"  (1.905 m)   Wt 100 kg   SpO2 98%   BMI 27.56 kg/m   Physical Exam Vitals signs and nursing note reviewed.  Constitutional:      General: He is not in acute distress.    Appearance: Normal appearance. He is well-developed. He is not ill-appearing, toxic-appearing or diaphoretic.  HENT:     Head: Normocephalic and atraumatic.     Nose: Nose normal. No congestion or rhinorrhea.  Mouth/Throat:     Mouth: Mucous membranes are moist.     Pharynx: Oropharynx is clear. No oropharyngeal exudate or posterior oropharyngeal erythema.  Eyes:     Extraocular Movements: Extraocular movements intact.     Conjunctiva/sclera: Conjunctivae normal.     Pupils: Pupils are equal, round, and reactive to light.  Neck:     Musculoskeletal: Normal range of motion and neck supple. No neck rigidity or muscular tenderness.  Cardiovascular:     Rate and Rhythm: Normal rate and regular rhythm.     Pulses: Normal pulses.     Heart sounds: Murmur present. No friction rub. No gallop.   Pulmonary:     Effort: Pulmonary effort is normal. No respiratory distress.     Breath sounds: Normal breath sounds. No stridor. No wheezing, rhonchi or rales.  Chest:     Chest wall: No tenderness.  Abdominal:     General: Abdomen is flat. There is no distension.     Palpations: Abdomen is soft.     Tenderness: There is no abdominal tenderness. There is no guarding or rebound.  Musculoskeletal: Normal range of motion.        General: No swelling, tenderness, deformity or signs of injury.  Skin:    General: Skin is warm and dry.  Neurological:     General: No focal deficit present.     Mental Status: He is alert and oriented to person, place, and time. Mental status is at baseline.     Cranial  Nerves: No cranial nerve deficit.     Sensory: No sensory deficit.     Motor: No weakness.     Coordination: Coordination normal.     Gait: Gait normal.  Psychiatric:        Mood and Affect: Mood normal.        Behavior: Behavior normal.      ED Treatments / Results  Labs (all labs ordered are listed, but only abnormal results are displayed) Labs Reviewed - No data to display  EKG EKG Interpretation  Date/Time:  Monday March 31 2019 13:19:53 EDT Ventricular Rate:  80 PR Interval:    QRS Duration: 201 QT Interval:  507 QTC Calculation: 585 R Axis:   -64 Text Interpretation:  Sinus rhythm Probable left atrial enlargement Right bundle branch block LVH with IVCD and secondary repol abnrm Inferior infarct, acute Prolonged QT interval Since last tracing QRS wider than prior Confirmed by Noemi Chapel [phone]) on 03/31/2019 1:31:30 PM   Radiology No results found.  Procedures Procedures (including critical care time)  Medications Ordered in ED Medications - No data to display   Initial Impression / Assessment and Plan / ED Course  I have reviewed the triage vital signs and the nursing notes.  Pertinent labs & imaging results that were available during my care of the patient were reviewed by me and considered in my medical decision making (see chart for details).        Marco Arnold is a 54 y.o. male with history of HTN and ESRD on dialysis MWF presents to the ED complaining of an episode of dizziness and generalized weakness around 2 AM this morning.  Patient reports some numbness to the soles of both feet and to the third and fourth fingertips of the left hand and nonneurologic distribution.  On physical exam, patient has no focal neurologic deficits, has normal coordination, and has a normal gait.  Patient was offered a head CT and further evaluation for TIA  but declined and said he would rather do this testing as an outpatient.  He was advised that he could return to the  ED at anytime for further evaluation.  He was advised to follow-up closely with his primary care physician.   Final Clinical Impressions(s) / ED Diagnoses   Final diagnoses:  Dizziness  Heart murmur    ED Discharge Orders    None       Candie Chroman, MD 03/31/19 1612    Noemi Chapel, MD 04/01/19 2028

## 2019-03-31 NOTE — ED Triage Notes (Signed)
Pt here with stoke like symptoms. Pt states at 0200 this morning pt experienced dizziness and weakness all over his body. Pt had numbness in his right foot. Pt also states he has numbness in his left hand. Pt is a dialysis pt and went today for his full treatment. Pt has a history of hypertension.

## 2019-03-31 NOTE — Discharge Instructions (Addendum)
We have offered you a CT scan and further work-up for possible stroke but you have declined.  You may follow-up with your doctor and when you do please inform them that you will need to have further evaluation such as an MRI, an echocardiogram and a carotid ultrasound.  You have a new heart murmur which will also need to be checked as part of your cardiac ultrasound.  You are more than welcome to return at any time should you change your mind or have worsening symptoms.

## 2019-03-31 NOTE — ED Provider Notes (Signed)
I saw and evaluated the patient, reviewed the resident's note and I agree with the findings and plan.  Pertinent History: The patient is a 53 year old male, presents from dialysis after he had had some symptoms of having difficulty with balance, feeling lightheaded, feeling like he was going to pass out overnight, on his way to dialysis he was noted to have an unstable gait but he states all of that has resolved.  He was told to come to the ER for further evaluation  Pertinent Exam findings: On exam the patient has clear sensorium, normal speech, cranial nerves III through XII are normal, finger-nose-finger is normal, no pronator drift, normal strength in all 4 extremities, diffusely symmetrical sensation to light touch and pinprick, normal gait.  I was personally present and directly supervised the following procedures:  I offered the patient evaluation with advanced imaging such as CT scan, lab work but he has declined and states he would rather follow-up with his family doctor.  I educated the patient about the risk of TIA turning into stroke within the next couple of days but he still declines and would rather do this as an outpatient.  He is aware that he may return at any time should he change his mind.  The patient also has a new heart murmur, he was informed of this and I have asked him to follow-up with his doctor regarding this as well.  I personally interpreted the EKG as well as the resident and agree with the interpretation on the resident's chart.  Final diagnoses:  Dizziness  Heart murmur      Noemi Chapel, MD 04/01/19 2028

## 2019-04-02 DIAGNOSIS — D509 Iron deficiency anemia, unspecified: Secondary | ICD-10-CM | POA: Diagnosis not present

## 2019-04-02 DIAGNOSIS — D631 Anemia in chronic kidney disease: Secondary | ICD-10-CM | POA: Diagnosis not present

## 2019-04-02 DIAGNOSIS — N2581 Secondary hyperparathyroidism of renal origin: Secondary | ICD-10-CM | POA: Diagnosis not present

## 2019-04-02 DIAGNOSIS — Z992 Dependence on renal dialysis: Secondary | ICD-10-CM | POA: Diagnosis not present

## 2019-04-02 DIAGNOSIS — N186 End stage renal disease: Secondary | ICD-10-CM | POA: Diagnosis not present

## 2019-04-02 DIAGNOSIS — S41102A Unspecified open wound of left upper arm, initial encounter: Secondary | ICD-10-CM | POA: Diagnosis not present

## 2019-04-03 ENCOUNTER — Other Ambulatory Visit: Payer: Self-pay | Admitting: *Deleted

## 2019-04-03 ENCOUNTER — Ambulatory Visit (INDEPENDENT_AMBULATORY_CARE_PROVIDER_SITE_OTHER): Payer: Medicare Other | Admitting: Vascular Surgery

## 2019-04-03 ENCOUNTER — Encounter: Payer: Self-pay | Admitting: Vascular Surgery

## 2019-04-03 ENCOUNTER — Encounter: Payer: Self-pay | Admitting: *Deleted

## 2019-04-03 ENCOUNTER — Other Ambulatory Visit: Payer: Self-pay

## 2019-04-03 VITALS — BP 147/84 | HR 75 | Temp 97.7°F | Resp 20 | Ht 75.0 in | Wt 232.0 lb

## 2019-04-03 DIAGNOSIS — N186 End stage renal disease: Secondary | ICD-10-CM

## 2019-04-03 DIAGNOSIS — Z992 Dependence on renal dialysis: Secondary | ICD-10-CM | POA: Diagnosis not present

## 2019-04-03 NOTE — H&P (View-Only) (Signed)
Referring Physician: Dr Arty Baumgartner  Patient name: Marco Arnold MRN: [id number] DOB: 04-12-1966 Sex: male  REASON FOR CONSULT: Aneurysmal degeneration left arm AV fistula  HPI: Marco Arnold is a 53 y.o. male, who had a left brachiocephalic AV fistula placed by Dr. Kellie Simmering in 2013.  He has had a few balloon angioplasties of the fistula.  Otherwise it has been working well.  He has had slow aneurysmal degeneration.  He has not had any prior bleeding episodes.  His dialysis today is Monday Wednesday Friday.    Past Medical History:  Diagnosis Date  . Arthritis    spine  . Chronic kidney disease    [place] MWF  . Dialysis patient [facility]) 2013  . Diverticulitis   . Hypertension    dr  Tobie Lords      @ [facility]  med  . Neuropathic pain 09/20/2017   Left mid-thoracic   Past Surgical History:  Procedure Laterality Date  . AV FISTULA PLACEMENT  11/10/2011   Procedure: ARTERIOVENOUS (AV) FISTULA CREATION;  Surgeon: Mal Misty, MD;  Location: [place];  Service: Vascular;  Laterality: Left;  Ultrasound guided  . COLONOSCOPY    . INSERTION OF DIALYSIS CATHETER  02/09/2012   Procedure: INSERTION OF DIALYSIS CATHETER;  Surgeon: Mal Misty, MD;  Location: [facility];  Service: Vascular;  Laterality: N/A;  . MASS EXCISION Right 09/07/2018   Procedure: EXCISION MASS RIGHT THUMB;  Surgeon: Charlotte Crumb, MD;  Location: [place];  Service: Orthopedics;  Laterality: Right;  OR AXILLARY BLOCK  . SHUNTOGRAM Left 06/03/2012   Procedure: SHUNTOGRAM;  Surgeon: Conrad Brenton, MD;  Location: [facility] CATH LAB;  Service: Cardiovascular;  Laterality: Left;  . SHUNTOGRAM Left 01/02/2013   Procedure: SHUNTOGRAM;  Surgeon: Conrad Sanford, MD;  Location: [facility] CATH LAB;  Service: Cardiovascular;  Laterality: Left;    Family History  Problem Relation Age of Onset  . Pancreatic cancer Mother   . Diabetes Father   . Hypertension Father   . Cancer - Lung Paternal Grandfather     SOCIAL HISTORY: Social History    Socioeconomic History  . Marital status: Divorced    Spouse name: Not on file  . Number of children: 2  . Years of education: 32  . Highest education level: Not on file  Occupational History  . Occupation: disabled  Social Needs  . Financial resource strain: Not on file  . Food insecurity    Worry: Not on file    Inability: Not on file  . Transportation needs    Medical: Not on file    Non-medical: Not on file  Tobacco Use  . Smoking status: Light Tobacco Smoker    Packs/day: 0.12    Years: 43.00    Pack years: 5.16    Types: Cigarettes  . Smokeless tobacco: Never Used  . Tobacco comment: pt states that he is trying to quit--is trying to cut back at the current time  Substance and Sexual Activity  . Alcohol use: Yes    Comment: occ- rare  . Drug use: Yes    Types: Marijuana    Comment: not currently  . Sexual activity: Not on file  Lifestyle  . Physical activity    Days per week: Not on file    Minutes per session: Not on file  . Stress: Not on file  Relationships  . Social Herbalist on phone: Not on file    Gets together: Not  on file    Attends religious service: Not on file    Active member of club or organization: Not on file    Attends meetings of clubs or organizations: Not on file    Relationship status: Not on file  . Intimate partner violence    Fear of current or ex partner: Not on file    Emotionally abused: Not on file    Physically abused: Not on file    Forced sexual activity: Not on file  Other Topics Concern  . Not on file  Social History Narrative   Lives with father   Caffeine use: none   Right handed     Allergies  Allergen Reactions  . Lisinopril Other (See Comments)    Unable to breath    Current Outpatient Medications  Medication Sig Dispense Refill  . amLODipine (NORVASC) 5 MG tablet Take 5 mg by mouth at bedtime.    Marland Kitchen atorvastatin (LIPITOR) 80 MG tablet     . carvedilol (COREG) 3.125 MG tablet     . cloNIDine  (CATAPRES) 0.1 MG tablet Take 0.2 mg by mouth daily.    . cyclobenzaprine (FLEXERIL) 10 MG tablet Take 20 mg by mouth 2 (two) times daily as needed for muscle spasms.     . DULoxetine (CYMBALTA) 60 MG capsule Take 60 mg by mouth daily.   2  . FOSRENOL 1000 MG chewable tablet Chew 4,000 mg by mouth 3 (three) times daily with meals.   11  . gabapentin (NEURONTIN) 100 MG capsule Take 3 capsules (300 mg total) by mouth at bedtime. 60 capsule 0  . losartan (COZAAR) 100 MG tablet Take 100 mg by mouth at bedtime.     . multivitamin (RENA-VIT) TABS tablet Take 1 tablet by mouth every evening.    . naproxen sodium (ALEVE) 220 MG tablet Take 220 mg by mouth daily as needed (pain).     . nortriptyline (PAMELOR) 25 MG capsule Take 25 mg by mouth at bedtime.   2  . pantoprazole (PROTONIX) 40 MG tablet Take 40 mg by mouth as needed (heartburn).     . sildenafil (VIAGRA) 25 MG tablet Take 25 mg by mouth daily as needed for erectile dysfunction.    . VENTOLIN HFA 108 (90 Base) MCG/ACT inhaler Inhale 2 puffs into the lungs every 6 (six) hours as needed for wheezing or shortness of breath.      No current facility-administered medications for this visit.     ROS:   General:  No weight loss, Fever, chills  HEENT: No recent headaches, no nasal bleeding, no visual changes, no sore throat  Neurologic: No dizziness, blackouts, seizures. No recent symptoms of stroke or mini- stroke. No recent episodes of slurred speech, or temporary blindness.  Cardiac: No recent episodes of chest pain/pressure, no shortness of breath at rest.  No shortness of breath with exertion.  Denies history of atrial fibrillation or irregular heartbeat  Vascular: No history of rest pain in feet.  No history of claudication.  No history of non-healing ulcer, No history of DVT   Pulmonary: No home oxygen, no productive cough, no hemoptysis,  No asthma or wheezing   Physical Examination  Vitals:   04/03/19 0945  BP: (!) 147/84   Pulse: 75  Resp: 20  Temp: 97.7 F (36.5 C)  SpO2: 98%  Weight: 232 lb (105.2 kg)  Height: 6\' 3"  (1.905 m)    Body mass index is 29 kg/m.  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  Risk benefits possible complications and procedure details were discussed with the patient today including not limited to bleeding infection fistula thrombosis.   Ruta Hinds, MD [facility] Office: [phone] Pager: [phone]

## 2019-04-03 NOTE — Progress Notes (Signed)
Referring Physician: Dr Arty Baumgartner  Patient name: Marco Arnold MRN: [id number] DOB: 1965-11-11 Sex: male  REASON FOR CONSULT: Aneurysmal degeneration left arm AV fistula  HPI: Marco Arnold is a 52 y.o. male, who had a left brachiocephalic AV fistula placed by Dr. Kellie Simmering in 2013.  He has had a few balloon angioplasties of the fistula.  Otherwise it has been working well.  He has had slow aneurysmal degeneration.  He has not had any prior bleeding episodes.  His dialysis today is Monday Wednesday Friday.    Past Medical History:  Diagnosis Date  . Arthritis    spine  . Chronic kidney disease    [place] MWF  . Dialysis patient [facility]) 2013  . Diverticulitis   . Hypertension    dr  Tobie Lords      @ [facility]  med  . Neuropathic pain 09/20/2017   Left mid-thoracic   Past Surgical History:  Procedure Laterality Date  . AV FISTULA PLACEMENT  11/10/2011   Procedure: ARTERIOVENOUS (AV) FISTULA CREATION;  Surgeon: Mal Misty, MD;  Location: [place];  Service: Vascular;  Laterality: Left;  Ultrasound guided  . COLONOSCOPY    . INSERTION OF DIALYSIS CATHETER  02/09/2012   Procedure: INSERTION OF DIALYSIS CATHETER;  Surgeon: Mal Misty, MD;  Location: [place];  Service: Vascular;  Laterality: N/A;  . MASS EXCISION Right 09/07/2018   Procedure: EXCISION MASS RIGHT THUMB;  Surgeon: Charlotte Crumb, MD;  Location: [place];  Service: Orthopedics;  Laterality: Right;  OR AXILLARY BLOCK  . SHUNTOGRAM Left 06/03/2012   Procedure: SHUNTOGRAM;  Surgeon: Conrad Lisbon, MD;  Location: [facility] CATH LAB;  Service: Cardiovascular;  Laterality: Left;  . SHUNTOGRAM Left 01/02/2013   Procedure: SHUNTOGRAM;  Surgeon: Conrad De[place] Trail, MD;  Location: [facility] CATH LAB;  Service: Cardiovascular;  Laterality: Left;    Family History  Problem Relation Age of Onset  . Pancreatic cancer Mother   . Diabetes Father   . Hypertension Father   . Cancer - Lung Paternal Grandfather     SOCIAL HISTORY: Social History    Socioeconomic History  . Marital status: Divorced    Spouse name: Not on file  . Number of children: 2  . Years of education: 18  . Highest education level: Not on file  Occupational History  . Occupation: disabled  Social Needs  . Financial resource strain: Not on file  . Food insecurity    Worry: Not on file    Inability: Not on file  . Transportation needs    Medical: Not on file    Non-medical: Not on file  Tobacco Use  . Smoking status: Light Tobacco Smoker    Packs/day: 0.12    Years: 43.00    Pack years: 5.16    Types: Cigarettes  . Smokeless tobacco: Never Used  . Tobacco comment: pt states that he is trying to quit--is trying to cut back at the current time  Substance and Sexual Activity  . Alcohol use: Yes    Comment: occ- rare  . Drug use: Yes    Types: Marijuana    Comment: not currently  . Sexual activity: Not on file  Lifestyle  . Physical activity    Days per week: Not on file    Minutes per session: Not on file  . Stress: Not on file  Relationships  . Social Herbalist on phone: Not on file    Gets together: Not  on file    Attends religious service: Not on file    Active member of club or organization: Not on file    Attends meetings of clubs or organizations: Not on file    Relationship status: Not on file  . Intimate partner violence    Fear of current or ex partner: Not on file    Emotionally abused: Not on file    Physically abused: Not on file    Forced sexual activity: Not on file  Other Topics Concern  . Not on file  Social History Narrative   Lives with father   Caffeine use: none   Right handed     Allergies  Allergen Reactions  . Lisinopril Other (See Comments)    Unable to breath    Current Outpatient Medications  Medication Sig Dispense Refill  . amLODipine (NORVASC) 5 MG tablet Take 5 mg by mouth at bedtime.    Marland Kitchen atorvastatin (LIPITOR) 80 MG tablet     . carvedilol (COREG) 3.125 MG tablet     . cloNIDine  (CATAPRES) 0.1 MG tablet Take 0.2 mg by mouth daily.    . cyclobenzaprine (FLEXERIL) 10 MG tablet Take 20 mg by mouth 2 (two) times daily as needed for muscle spasms.     . DULoxetine (CYMBALTA) 60 MG capsule Take 60 mg by mouth daily.   2  . FOSRENOL 1000 MG chewable tablet Chew 4,000 mg by mouth 3 (three) times daily with meals.   11  . gabapentin (NEURONTIN) 100 MG capsule Take 3 capsules (300 mg total) by mouth at bedtime. 60 capsule 0  . losartan (COZAAR) 100 MG tablet Take 100 mg by mouth at bedtime.     . multivitamin (RENA-VIT) TABS tablet Take 1 tablet by mouth every evening.    . naproxen sodium (ALEVE) 220 MG tablet Take 220 mg by mouth daily as needed (pain).     . nortriptyline (PAMELOR) 25 MG capsule Take 25 mg by mouth at bedtime.   2  . pantoprazole (PROTONIX) 40 MG tablet Take 40 mg by mouth as needed (heartburn).     . sildenafil (VIAGRA) 25 MG tablet Take 25 mg by mouth daily as needed for erectile dysfunction.    . VENTOLIN HFA 108 (90 Base) MCG/ACT inhaler Inhale 2 puffs into the lungs every 6 (six) hours as needed for wheezing or shortness of breath.      No current facility-administered medications for this visit.     ROS:   General:  No weight loss, Fever, chills  HEENT: No recent headaches, no nasal bleeding, no visual changes, no sore throat  Neurologic: No dizziness, blackouts, seizures. No recent symptoms of stroke or mini- stroke. No recent episodes of slurred speech, or temporary blindness.  Cardiac: No recent episodes of chest pain/pressure, no shortness of breath at rest.  No shortness of breath with exertion.  Denies history of atrial fibrillation or irregular heartbeat  Vascular: No history of rest pain in feet.  No history of claudication.  No history of non-healing ulcer, No history of DVT   Pulmonary: No home oxygen, no productive cough, no hemoptysis,  No asthma or wheezing   Physical Examination  Vitals:   04/03/19 0945  BP: (!) 147/84   Pulse: 75  Resp: 20  Temp: 97.7 F (36.5 C)  SpO2: 98%  Weight: 232 lb (105.2 kg)  Height: 6\' 3"  (1.905 m)    Body mass index is 29 kg/m.  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  Risk benefits possible complications and procedure details were discussed with the patient today including not limited to bleeding infection fistula thrombosis.   Ruta Hinds, MD [facility] Office: [phone] Pager: [phone]

## 2019-04-04 DIAGNOSIS — N2581 Secondary hyperparathyroidism of renal origin: Secondary | ICD-10-CM | POA: Diagnosis not present

## 2019-04-04 DIAGNOSIS — N186 End stage renal disease: Secondary | ICD-10-CM | POA: Diagnosis not present

## 2019-04-04 DIAGNOSIS — S41102A Unspecified open wound of left upper arm, initial encounter: Secondary | ICD-10-CM | POA: Diagnosis not present

## 2019-04-04 DIAGNOSIS — D631 Anemia in chronic kidney disease: Secondary | ICD-10-CM | POA: Diagnosis not present

## 2019-04-04 DIAGNOSIS — D509 Iron deficiency anemia, unspecified: Secondary | ICD-10-CM | POA: Diagnosis not present

## 2019-04-04 DIAGNOSIS — Z992 Dependence on renal dialysis: Secondary | ICD-10-CM | POA: Diagnosis not present

## 2019-04-05 ENCOUNTER — Other Ambulatory Visit (HOSPITAL_COMMUNITY)
Admission: RE | Admit: 2019-04-05 | Discharge: 2019-04-05 | Disposition: A | Discharge status: Home / Self Care | Payer: Medicare Other | Source: Ambulatory Visit | Attending: Vascular Surgery | Admitting: Vascular Surgery

## 2019-04-05 DIAGNOSIS — Z20828 Contact with and (suspected) exposure to other viral communicable diseases: Secondary | ICD-10-CM | POA: Diagnosis not present

## 2019-04-05 DIAGNOSIS — Z01812 Encounter for preprocedural laboratory examination: Secondary | ICD-10-CM | POA: Diagnosis not present

## 2019-04-05 LAB — SARS CORONAVIRUS 2 (TAT 6-24 HRS): SARS Coronavirus 2: NEGATIVE

## 2019-04-07 ENCOUNTER — Other Ambulatory Visit: Payer: Self-pay

## 2019-04-07 ENCOUNTER — Encounter (HOSPITAL_COMMUNITY): Payer: Self-pay | Admitting: *Deleted

## 2019-04-07 DIAGNOSIS — S41102A Unspecified open wound of left upper arm, initial encounter: Secondary | ICD-10-CM | POA: Diagnosis not present

## 2019-04-07 DIAGNOSIS — D509 Iron deficiency anemia, unspecified: Secondary | ICD-10-CM | POA: Diagnosis not present

## 2019-04-07 DIAGNOSIS — Z992 Dependence on renal dialysis: Secondary | ICD-10-CM | POA: Diagnosis not present

## 2019-04-07 DIAGNOSIS — N2581 Secondary hyperparathyroidism of renal origin: Secondary | ICD-10-CM | POA: Diagnosis not present

## 2019-04-07 DIAGNOSIS — D631 Anemia in chronic kidney disease: Secondary | ICD-10-CM | POA: Diagnosis not present

## 2019-04-07 DIAGNOSIS — N186 End stage renal disease: Secondary | ICD-10-CM | POA: Diagnosis not present

## 2019-04-07 NOTE — Anesthesia Preprocedure Evaluation (Addendum)
Anesthesia Evaluation  Patient identified by MRN, date of birth, ID band Patient awake    Reviewed: Allergy & Precautions, Patient's Chart, lab work & pertinent test results  Airway Mallampati: I  TM Distance: >3 FB Neck ROM: Full    Dental   Pulmonary Current Smoker and Patient abstained from smoking.,    Pulmonary exam normal        Cardiovascular hypertension, Pt. on medications Normal cardiovascular exam     Neuro/Psych    GI/Hepatic   Endo/Other    Renal/GU Dialysis and ESRFRenal disease     Musculoskeletal   Abdominal   Peds  Hematology   Anesthesia Other Findings   Reproductive/Obstetrics                           Anesthesia Physical Anesthesia Plan  ASA: III  Anesthesia Plan: General   Post-op Pain Management:    Induction: Intravenous  PONV Risk Score and Plan: 0 and Ondansetron and Treatment may vary due to age or medical condition  Airway Management Planned: LMA  Additional Equipment:   Intra-op Plan:   Post-operative Plan: Extubation in OR  Informed Consent: I have reviewed the patients History and Physical, chart, labs and discussed the procedure including the risks, benefits and alternatives for the proposed anesthesia with the patient or authorized representative who has indicated his/her understanding and acceptance.       Plan Discussed with: CRNA and Surgeon  Anesthesia Plan Comments: (See PAT note written 04/07/2019 by Myra Gianotti, PA-C. )       Anesthesia Quick Evaluation

## 2019-04-07 NOTE — Progress Notes (Signed)
Anesthesia Chart Review: SAME DAY WORK-UP   Case: [id number] Date/Time: 04/08/19 0929   Procedure: REVISION PLICATION OF LEFT ARM ARTERIOVENOUS FISTULA (Left )   Anesthesia type: General   Pre-op diagnosis: left arm fistula pseudoaneurysm   [facility] RO[facility] 11 / [facility]   Surgeon: Elam Dutch, MD      DISCUSSION: Patient is a 53 year old male (will be 22 on surgery date) scheduled for the above procedure. He was seen by Dr. Oneida Alar on 04/03/2019 for a left AVF pseudoaneurysm that is felt at risk for bleeding. Case is posted for GA.  History includes smoking, ESRD (on HD MWF, [place]), HTN, CAD (s/p DES LAD, LPDA 09/26/2018), HLD, neuropathic pain (left mid-thoracic), s/p right thumb mass excision 09/07/18 (pathology: benign cyst with calcifications).  - ED evaluation 03/31/2019 for dizziness, balance difficulties/unstable gait. First noted at 2:00 AM along with numbness in right foot and left hand. He went to dialysis and symptoms resolved. He apparently was sent to the ED after HD given his reported symptoms, but because symptoms had resolved, patient declined further work-up. Mumur noted on exam (aortic sclerosis on 05/2018 echo). Close follow-up with PCP advised. (No significant ICA stenosis by 06/2018 carotid US at [facility].)    - CAD diagnosed following abnormal stress test as part of pre-kidney transplant work-up. EF 45-50%, mild global hypokinesis by 06/04/18 echo, EF 34% with diffuse LV dilation and inferior scar, akinesis of the apex, hypsokinesis of the lateral wall, and mild hypokinesis of remaining walls. It does not appear that he has had cardiology follow-up since his 09/26/2018 DES x2. He is in the XIENCE 28 [place] Study (a prospective, single arm, multi-center, open label, non-randomized trial to evluate safety of 1 -month DAPT in subjectss at high risk of bleeding). He reports that he took Brilinta for one month and is now on ASA 81 mg daily. He denied chest pain, cough, SOB, fever.    Reviewed available information with anesthesiologist Hoy Morn, MD. Anesthesia team to evaluate on the day of surgery.  Negative presurgical COVID test 04/05/2019.   PROVIDERS: Cyndi Bender, PA-C is PCP  Curly Rim, MD is cardiologist ([place])   LABS: He is for labs on arrival.     IMAGES: CXR 07/09/2018 [facility] CE): Result Impression: No radiographic evidence of acute cardiopulmonary abnormality.   PULM: PFT 07/18/2018 [facility] CE): Component Name Value Ref Range  FVC Pre-Actual 5.50 L  FVC %Pred-Pre 96.1 %  FVC LLN 4.45 L  FEV1 Pre-Actual 3.79 L  FEV1 %Pred-Pre 85.3 %  FEV1 LLN 3.44 L  FEV1FVC Pre-Actual 69 %  FEV1FVC LLN 67 %  SVC Pre-Actual 5.61 L  SVC %Pred-Pre 101.0 %  RVPLETH %Pred-Pre 162.2 %  TLCPLETH Pre-Actual 9.38 L  TLCPLETH %Pred-Pre 119.0 %  TLCPLETH LLN 6.29 L  DLCOUNC Pre-Actual 26.51 ml/min/mmHg  DLCOUNC %Pred-Pre 79.8 %  DLCOUNC LLN 25.27 ml/min/mmHg  DLVA %Pred-Pre 69.1 %  FEV1SVC Pre-Actual 68 %    EKG:  EKG 03/31/2019: Probable left atrial enlargement Right bundle branch block LVH with IVCD and secondary repol abnrm Inferior infarct, acute Prolonged QT interval Since last tracing QRS wider than prior [08/15/2016] Confirmed by Noemi Chapel [phone]) on 03/31/2019 1:31:30 PM - Overall, I think tracing is similar to 09/03/18 EKG scanned under Media tab, Correspondence, 09/07/18   CV:  Cardiac Cath 09/26/2018 [facility] CE, done for abnormal stress test 07/18/2018): Result Narrative:  High grade mid LAD, moderate lesion of diagonal at LAD lesion ([place]  0, 1, 1). Obstructive  LPDA. Otherwise mild to moderate non-obstructive CAD. PCI:  - PTCA of LAD with a 2.5 x 12 balloon, PCI with a 3.0 x 18 Xience DES and post dilated with a 3.0 x 12 [place] balloon to high pressure. TIMI III flow with )% residual, no significant plaque shift into diagonal.  - PTCA of LPDA with a 2.5 x 12 balloon, PCI with a 2.5 x 23 Xience DES and post  dilated with a 2.5 x 12 [place] to high pressure. TIMI III flow with 0% residual.  - Provider discussed Xience 28 trial with patient and will have research coordinators see him.    Nuclear Stress 07/18/2018 [facility] CE): Result Impression: 1.) LIMITATIONS: None. 2.) MYOCARDIAL PERFUSION: Severe fixed perfusion defect at the inferior wall consistent with a transmural scar. No inducible transmural perfusion defect elsewhere. 3.) LEFT VENTRICULAR EJECTION FRACTION: Decreased, measured at 34% on the stress images. 4.) REGIONAL WALL MOTION: Akinesis at the apex, significant hypokinesis of the lateral wall, and mild hypokinesis of the remaining walls on both the gated rest and stress images.   Carotid US 07/18/2018 [facility] CE):  Impression: Right: 21-39% diameter reduction of the right bulb.  No evidence of hemodynamically significant internal carotid artery stenosis.  The right vertebral artery flow is antegrade. Left: 21-39% diameter reduction of the left bulb.  No evidence of hemodynamically significant internal carotid artery stenosis.  The left vertebral artery flow is antegrade.   TTE 06/04/2018 [facility] CE): SUMMARY The left ventricle is mildly dilated. There is mild concentric left ventricular hypertrophy. LV ejection fraction = 45-50%. Left ventricular systolic function is mildly reduced. There is mild global hypokinesis of the left ventricle. The right ventricle is normal in size and function. The left atrium is mildly dilated. Right atrial size is normal. There is aortic valve sclerosis. Ao V2 max: 153.1 cm/sec Ao max PG: 9.4 mmHg Ao V2 mean: 102.3 cm/sec Ao mean PG: 4.6 mmHg Ao V2 VTI: 23.8 cm AVA (VTI): 3.1 cm2 LV V1 VTI: 17.4 cm AS Dimensionless Index (VTI): 0.73 AVAi (VTI) cm^2/m^2: 1.3 cm2 There is trivial pericardial effusion. Compared to images of prior exam of 03/11/16 LV function has declined.   Past Medical History:  Diagnosis Date  . Arthritis    spine  . Chronic  kidney disease    [place] MWF  . Dialysis patient [facility]) 2013  . Diverticulitis   . Hyperlipidemia   . Hypertension    dr  Tobie Lords      @ [facility]  med  . Neuromuscular disorder ([place])    nerve pain - tx w/cymbalta  . Neuropathic pain 09/20/2017   Left mid-thoracic    Past Surgical History:  Procedure Laterality Date  . AV FISTULA PLACEMENT  11/10/2011   Procedure: ARTERIOVENOUS (AV) FISTULA CREATION;  Surgeon: Mal Misty, MD;  Location: [place];  Service: Vascular;  Laterality: Left;  Ultrasound guided  . COLONOSCOPY    . INSERTION OF DIALYSIS CATHETER  02/09/2012   Procedure: INSERTION OF DIALYSIS CATHETER;  Surgeon: Mal Misty, MD;  Location: [place];  Service: Vascular;  Laterality: N/A;  . MASS EXCISION Right 09/07/2018   Procedure: EXCISION MASS RIGHT THUMB;  Surgeon: Charlotte Crumb, MD;  Location: [place];  Service: Orthopedics;  Laterality: Right;  OR AXILLARY BLOCK  . SHUNTOGRAM Left 06/03/2012   Procedure: SHUNTOGRAM;  Surgeon: Conrad Mullins, MD;  Location: [facility] CATH LAB;  Service: Cardiovascular;  Laterality: Left;  . SHUNTOGRAM Left 01/02/2013  Procedure: SHUNTOGRAM;  Surgeon: Conrad Ethel, MD;  Location: [facility] CATH LAB;  Service: Cardiovascular;  Laterality: Left;    MEDICATIONS: No current facility-administered medications for this encounter.    Marland Kitchen amLODipine (NORVASC) 5 MG tablet  . atorvastatin (LIPITOR) 80 MG tablet  . carvedilol (COREG) 3.125 MG tablet  . cloNIDine (CATAPRES) 0.1 MG tablet  . cyclobenzaprine (FLEXERIL) 10 MG tablet  . DULoxetine (CYMBALTA) 60 MG capsule  . FOSRENOL 1000 MG chewable tablet  . gabapentin (NEURONTIN) 100 MG capsule  . losartan (COZAAR) 100 MG tablet  . multivitamin (RENA-VIT) TABS tablet  . naproxen sodium (ALEVE) 220 MG tablet  . nortriptyline (PAMELOR) 25 MG capsule  . pantoprazole (PROTONIX) 40 MG tablet  . sildenafil (VIAGRA) 25 MG tablet  . VENTOLIN HFA 108 (90 Base) MCG/ACT inhaler    Myra Gianotti, PA-C Surgical  Short Stay/Anesthesiology [facility] Phone [phone] [facility] Phone [phone] 04/07/2019 4:08 PM

## 2019-04-07 NOTE — Progress Notes (Signed)
Patient denies shortness of breath, fever, cough and chest pain.  PCP - Cyndi Bender, PA-C Cardiologist - Dr Curly Rim  Chest x-ray - 07/09/18 [facility] EKG -03/31/19  Stress Test - 07/18/18 [facility] Requested ECHO - 11/08/11 Epic, 03/21/16 CE Requested Cardiac Cath - 09/19/18 [facility] Requested  Anesthesia review: Yes, [place], [place]   STOP now taking any Aspirin (unless otherwise instructed by your surgeon), Aleve, Naproxen, Ibuprofen, Motrin, Advil, Goody's, BC's, all herbal medications, fish oil, and all vitamins.   Coronavirus Screening Have you or your Dad experienced the following symptoms:  Cough yes/no: No Fever (>100.8F)  yes/no: No Runny nose yes/no: No Sore throat yes/no: No Difficulty breathing/shortness of breath  yes/no: No  Have you or your Dad traveled in the last 14 days and where? yes/no: No

## 2019-04-08 ENCOUNTER — Encounter (HOSPITAL_COMMUNITY)
Admission: RE | Disposition: A | Discharge status: Home / Self Care | Payer: Self-pay | Source: Home / Self Care | Attending: Vascular Surgery

## 2019-04-08 ENCOUNTER — Encounter (HOSPITAL_COMMUNITY): Payer: Self-pay | Admitting: Surgery

## 2019-04-08 ENCOUNTER — Observation Stay (HOSPITAL_COMMUNITY): Payer: Medicare Other | Admitting: Vascular Surgery

## 2019-04-08 ENCOUNTER — Observation Stay (HOSPITAL_COMMUNITY)
Admission: RE | Admit: 2019-04-08 | Discharge: 2019-04-09 | Disposition: A | Discharge status: Home / Self Care | Payer: Medicare Other | Attending: Vascular Surgery | Admitting: Vascular Surgery

## 2019-04-08 DIAGNOSIS — E875 Hyperkalemia: Secondary | ICD-10-CM | POA: Diagnosis not present

## 2019-04-08 DIAGNOSIS — T82590A Other mechanical complication of surgically created arteriovenous fistula, initial encounter: Secondary | ICD-10-CM | POA: Diagnosis not present

## 2019-04-08 DIAGNOSIS — T82898A Other specified complication of vascular prosthetic devices, implants and grafts, initial encounter: Secondary | ICD-10-CM | POA: Diagnosis not present

## 2019-04-08 DIAGNOSIS — E1122 Type 2 diabetes mellitus with diabetic chronic kidney disease: Secondary | ICD-10-CM | POA: Insufficient documentation

## 2019-04-08 DIAGNOSIS — Z800 Family history of malignant neoplasm of digestive organs: Secondary | ICD-10-CM | POA: Diagnosis not present

## 2019-04-08 DIAGNOSIS — Z992 Dependence on renal dialysis: Secondary | ICD-10-CM | POA: Insufficient documentation

## 2019-04-08 DIAGNOSIS — N186 End stage renal disease: Secondary | ICD-10-CM | POA: Diagnosis not present

## 2019-04-08 DIAGNOSIS — E785 Hyperlipidemia, unspecified: Secondary | ICD-10-CM | POA: Insufficient documentation

## 2019-04-08 DIAGNOSIS — Z7982 Long term (current) use of aspirin: Secondary | ICD-10-CM | POA: Insufficient documentation

## 2019-04-08 DIAGNOSIS — Z79899 Other long term (current) drug therapy: Secondary | ICD-10-CM | POA: Insufficient documentation

## 2019-04-08 DIAGNOSIS — Z801 Family history of malignant neoplasm of trachea, bronchus and lung: Secondary | ICD-10-CM | POA: Insufficient documentation

## 2019-04-08 DIAGNOSIS — X58XXXA Exposure to other specified factors, initial encounter: Secondary | ICD-10-CM | POA: Diagnosis not present

## 2019-04-08 DIAGNOSIS — F17210 Nicotine dependence, cigarettes, uncomplicated: Secondary | ICD-10-CM | POA: Diagnosis not present

## 2019-04-08 DIAGNOSIS — Z833 Family history of diabetes mellitus: Secondary | ICD-10-CM | POA: Insufficient documentation

## 2019-04-08 DIAGNOSIS — M1990 Unspecified osteoarthritis, unspecified site: Secondary | ICD-10-CM | POA: Diagnosis not present

## 2019-04-08 DIAGNOSIS — N2581 Secondary hyperparathyroidism of renal origin: Secondary | ICD-10-CM | POA: Diagnosis not present

## 2019-04-08 DIAGNOSIS — I120 Hypertensive chronic kidney disease with stage 5 chronic kidney disease or end stage renal disease: Secondary | ICD-10-CM | POA: Insufficient documentation

## 2019-04-08 DIAGNOSIS — D631 Anemia in chronic kidney disease: Secondary | ICD-10-CM | POA: Diagnosis not present

## 2019-04-08 DIAGNOSIS — Z8249 Family history of ischemic heart disease and other diseases of the circulatory system: Secondary | ICD-10-CM | POA: Insufficient documentation

## 2019-04-08 HISTORY — DX: Hyperlipidemia, unspecified: E78.5

## 2019-04-08 HISTORY — DX: Atherosclerotic heart disease of native coronary artery without angina pectoris: I25.10

## 2019-04-08 HISTORY — DX: Myoneural disorder, unspecified: G70.9

## 2019-04-08 HISTORY — PX: REVISON OF ARTERIOVENOUS FISTULA: SHX6074

## 2019-04-08 LAB — POCT I-STAT 4, (NA,K, GLUC, HGB,HCT)
Glucose, Bld: 90 mg/dL (ref 70–99)
HCT: 32 % — ABNORMAL LOW (ref 39.0–52.0)
Hemoglobin: 10.9 g/dL — ABNORMAL LOW (ref 13.0–17.0)
Potassium: 6.5 mmol/L (ref 3.5–5.1)
Sodium: 134 mmol/L — ABNORMAL LOW (ref 135–145)

## 2019-04-08 LAB — RENAL FUNCTION PANEL
Albumin: 3.4 g/dL — ABNORMAL LOW (ref 3.5–5.0)
Anion gap: 13 (ref 5–15)
BUN: 18 mg/dL (ref 6–20)
CO2: 26 mmol/L (ref 22–32)
Calcium: 8.6 mg/dL — ABNORMAL LOW (ref 8.9–10.3)
Chloride: 99 mmol/L (ref 98–111)
Creatinine, Ser: 3.54 mg/dL — ABNORMAL HIGH (ref 0.61–1.24)
GFR calc Af Amer: 22 mL/min — ABNORMAL LOW (ref >60)
GFR calc non Af Amer: 19 mL/min — ABNORMAL LOW (ref >60)
Glucose, Bld: 82 mg/dL (ref 70–99)
Phosphorus: 3.3 mg/dL (ref 2.5–4.6)
Potassium: 3.5 mmol/L (ref 3.5–5.1)
Sodium: 138 mmol/L (ref 135–145)

## 2019-04-08 LAB — POTASSIUM: Potassium: 6.6 mmol/L (ref 3.5–5.1)

## 2019-04-08 SURGERY — REVISON OF ARTERIOVENOUS FISTULA
Anesthesia: General | Laterality: Left

## 2019-04-08 MED ORDER — LIDOCAINE HCL (PF) 1 % IJ SOLN
INTRAMUSCULAR | Status: AC
  Filled 2019-04-08: qty 30

## 2019-04-08 MED ORDER — PHENOL 1.4 % MT LIQD: 1.0000 | OROMUCOSAL | Status: DC | PRN

## 2019-04-08 MED ORDER — METOPROLOL TARTRATE 5 MG/5ML IV SOLN: 2.0000 mg | INTRAVENOUS | Status: DC | PRN

## 2019-04-08 MED ORDER — PROPOFOL 10 MG/ML IV BOLUS
INTRAVENOUS | Status: AC
  Filled 2019-04-08: qty 20

## 2019-04-08 MED ORDER — PANTOPRAZOLE SODIUM 40 MG PO TBEC: 40.0000 mg | DELAYED_RELEASE_TABLET | Freq: Every day | ORAL | Status: DC

## 2019-04-08 MED ORDER — HEMOSTATIC AGENTS (NO CHARGE) OPTIME
TOPICAL | Status: DC | PRN
  Administered 2019-04-08: 1 via TOPICAL

## 2019-04-08 MED ORDER — ONDANSETRON HCL 4 MG/2ML IJ SOLN
INTRAMUSCULAR | Status: DC | PRN
  Administered 2019-04-08: 4 mg via INTRAVENOUS

## 2019-04-08 MED ORDER — LIDOCAINE HCL (PF) 1 % IJ SOLN
INTRAMUSCULAR | Status: DC | PRN
  Administered 2019-04-08: 30 mL

## 2019-04-08 MED ORDER — PROPOFOL 500 MG/50ML IV EMUL
INTRAVENOUS | Status: DC | PRN
  Administered 2019-04-08: 50 ug/kg/min via INTRAVENOUS

## 2019-04-08 MED ORDER — FENTANYL CITRATE (PF) 250 MCG/5ML IJ SOLN
INTRAMUSCULAR | Status: AC
  Filled 2019-04-08: qty 5

## 2019-04-08 MED ORDER — DEXAMETHASONE SODIUM PHOSPHATE 10 MG/ML IJ SOLN
INTRAMUSCULAR | Status: AC
  Filled 2019-04-08: qty 1

## 2019-04-08 MED ORDER — SODIUM CHLORIDE 0.9 % IV SOLN
INTRAVENOUS | Status: DC | PRN
  Administered 2019-04-08: 500 mL

## 2019-04-08 MED ORDER — PHENYLEPHRINE HCL (PRESSORS) 10 MG/ML IV SOLN
INTRAVENOUS | Status: DC | PRN
  Administered 2019-04-08 (×4): 80 ug via INTRAVENOUS

## 2019-04-08 MED ORDER — HYDROMORPHONE HCL 1 MG/ML IJ SOLN
0.2500 mg | INTRAMUSCULAR | Status: DC | PRN
  Administered 2019-04-08 (×2): 0.25 mg via INTRAVENOUS

## 2019-04-08 MED ORDER — PROTAMINE SULFATE 10 MG/ML IV SOLN
INTRAVENOUS | Status: AC
  Filled 2019-04-08: qty 5

## 2019-04-08 MED ORDER — ONDANSETRON HCL 4 MG/2ML IJ SOLN
INTRAMUSCULAR | Status: AC
  Filled 2019-04-08: qty 2

## 2019-04-08 MED ORDER — HYDROMORPHONE HCL 1 MG/ML IJ SOLN
INTRAMUSCULAR | Status: AC
  Filled 2019-04-08: qty 1

## 2019-04-08 MED ORDER — SODIUM CHLORIDE 0.9 % IV SOLN
INTRAVENOUS | Status: DC
  Administered 2019-04-08: 08:00:00 via INTRAVENOUS

## 2019-04-08 MED ORDER — HYDROCODONE-ACETAMINOPHEN 5-325 MG PO TABS: 1.0000 | ORAL_TABLET | Freq: Four times a day (QID) | ORAL | 0 refills | Status: DC | PRN

## 2019-04-08 MED ORDER — DEXAMETHASONE SODIUM PHOSPHATE 10 MG/ML IJ SOLN
INTRAMUSCULAR | Status: DC | PRN
  Administered 2019-04-08: 10 mg via INTRAVENOUS

## 2019-04-08 MED ORDER — LABETALOL HCL 5 MG/ML IV SOLN: 10.0000 mg | INTRAVENOUS | Status: DC | PRN

## 2019-04-08 MED ORDER — GUAIFENESIN-DM 100-10 MG/5ML PO SYRP: 15.0000 mL | ORAL_SOLUTION | ORAL | Status: DC | PRN

## 2019-04-08 MED ORDER — MIDAZOLAM HCL 2 MG/2ML IJ SOLN
INTRAMUSCULAR | Status: DC | PRN
  Administered 2019-04-08: 2 mg via INTRAVENOUS

## 2019-04-08 MED ORDER — LIDOCAINE HCL (CARDIAC) PF 100 MG/5ML IV SOSY
PREFILLED_SYRINGE | INTRAVENOUS | Status: DC | PRN
  Administered 2019-04-08: 60 mg via INTRATRACHEAL

## 2019-04-08 MED ORDER — SODIUM CHLORIDE 0.9 % IV SOLN
INTRAVENOUS | Status: AC
  Filled 2019-04-08: qty 1.2

## 2019-04-08 MED ORDER — FENTANYL CITRATE (PF) 250 MCG/5ML IJ SOLN
INTRAMUSCULAR | Status: DC | PRN
  Administered 2019-04-08 (×2): 50 ug via INTRAVENOUS

## 2019-04-08 MED ORDER — ONDANSETRON HCL 4 MG/2ML IJ SOLN: 4.0000 mg | Freq: Four times a day (QID) | INTRAMUSCULAR | Status: DC | PRN

## 2019-04-08 MED ORDER — POTASSIUM CHLORIDE CRYS ER 20 MEQ PO TBCR: 20.0000 meq | EXTENDED_RELEASE_TABLET | Freq: Once | ORAL | Status: DC

## 2019-04-08 MED ORDER — EPHEDRINE SULFATE 50 MG/ML IJ SOLN
INTRAMUSCULAR | Status: DC | PRN
  Administered 2019-04-08: 10 mg via INTRAVENOUS

## 2019-04-08 MED ORDER — MEPERIDINE HCL 25 MG/ML IJ SOLN: 6.2500 mg | INTRAMUSCULAR | Status: DC | PRN

## 2019-04-08 MED ORDER — PHENYLEPHRINE HCL (PRESSORS) 10 MG/ML IV SOLN
INTRAVENOUS | Status: AC
  Filled 2019-04-08: qty 1

## 2019-04-08 MED ORDER — SODIUM CHLORIDE 0.9 % IV SOLN
INTRAVENOUS | Status: DC
  Administered 2019-04-08: 19:00:00 via INTRAVENOUS

## 2019-04-08 MED ORDER — ONDANSETRON HCL 4 MG/2ML IJ SOLN: 4.0000 mg | Freq: Once | INTRAMUSCULAR | Status: DC | PRN

## 2019-04-08 MED ORDER — ROCURONIUM BROMIDE 10 MG/ML (PF) SYRINGE
PREFILLED_SYRINGE | INTRAVENOUS | Status: AC
  Filled 2019-04-08: qty 10

## 2019-04-08 MED ORDER — CHLORHEXIDINE GLUCONATE CLOTH 2 % EX PADS: 6.0000 | MEDICATED_PAD | Freq: Every day | CUTANEOUS | Status: DC

## 2019-04-08 MED ORDER — MIDAZOLAM HCL 2 MG/2ML IJ SOLN
INTRAMUSCULAR | Status: AC
  Filled 2019-04-08: qty 2

## 2019-04-08 MED ORDER — ALUM & MAG HYDROXIDE-SIMETH 200-200-20 MG/5ML PO SUSP: 15.0000 mL | ORAL | Status: DC | PRN

## 2019-04-08 MED ORDER — LIDOCAINE 2% (20 MG/ML) 5 ML SYRINGE
INTRAMUSCULAR | Status: AC
  Filled 2019-04-08: qty 5

## 2019-04-08 MED ORDER — SODIUM CHLORIDE 0.9 % IV SOLN
INTRAVENOUS | Status: DC | PRN
  Administered 2019-04-08: 50 ug/min via INTRAVENOUS

## 2019-04-08 MED ORDER — 0.9 % SODIUM CHLORIDE (POUR BTL) OPTIME
TOPICAL | Status: DC | PRN
  Administered 2019-04-08: 1000 mL

## 2019-04-08 MED ORDER — CEFAZOLIN SODIUM 1 G IJ SOLR
INTRAMUSCULAR | Status: AC
  Filled 2019-04-08: qty 20

## 2019-04-08 MED ORDER — CEFAZOLIN SODIUM-DEXTROSE 2-4 GM/100ML-% IV SOLN
2.0000 g | INTRAVENOUS | Status: DC
  Filled 2019-04-08: qty 100

## 2019-04-08 MED ORDER — HYDRALAZINE HCL 20 MG/ML IJ SOLN: 5.0000 mg | INTRAMUSCULAR | Status: DC | PRN

## 2019-04-08 MED ORDER — PROPOFOL 10 MG/ML IV BOLUS
INTRAVENOUS | Status: DC | PRN
  Administered 2019-04-08: 30 mg via INTRAVENOUS
  Administered 2019-04-08: 20 mg via INTRAVENOUS
  Administered 2019-04-08: 30 mg via INTRAVENOUS
  Administered 2019-04-08: 120 mg via INTRAVENOUS

## 2019-04-08 SURGICAL SUPPLY — 45 items
ADH SKN CLS APL DERMABOND .7 (GAUZE/BANDAGES/DRESSINGS) ×1
ARMBAND PINK RESTRICT EXTREMIT (MISCELLANEOUS) ×3 IMPLANT
BNDG ELASTIC 4X5.8 VLCR STR LF (GAUZE/BANDAGES/DRESSINGS) ×6 IMPLANT
BNDG GAUZE ELAST 4 BULKY (GAUZE/BANDAGES/DRESSINGS) ×3 IMPLANT
CANISTER SUCT 3000ML PPV (MISCELLANEOUS) ×3 IMPLANT
CANNULA VESSEL 3MM 2 BLNT TIP (CANNULA) ×3 IMPLANT
CLIP VESOCCLUDE MED 6/CT (CLIP) ×3 IMPLANT
CLIP VESOCCLUDE SM WIDE 6/CT (CLIP) ×3 IMPLANT
COVER PROBE W GEL 5X96 (DRAPES) IMPLANT
COVER WAND RF STERILE (DRAPES) ×3 IMPLANT
DECANTER SPIKE VIAL GLASS SM (MISCELLANEOUS) ×3 IMPLANT
DERMABOND ADVANCED (GAUZE/BANDAGES/DRESSINGS) ×2
DERMABOND ADVANCED .7 DNX12 (GAUZE/BANDAGES/DRESSINGS) ×1 IMPLANT
DRAIN PENROSE 1/4X12 LTX STRL (WOUND CARE) IMPLANT
ELECT REM PT RETURN 9FT ADLT (ELECTROSURGICAL) ×3
ELECTRODE REM PT RTRN 9FT ADLT (ELECTROSURGICAL) ×1 IMPLANT
GAUZE SPONGE 4X4 12PLY STRL LF (GAUZE/BANDAGES/DRESSINGS) ×3 IMPLANT
GLOVE BIO SURGEON STRL SZ7.5 (GLOVE) ×9 IMPLANT
GLOVE BIOGEL PI IND STRL 7.0 (GLOVE) ×1 IMPLANT
GLOVE BIOGEL PI IND STRL 7.5 (GLOVE) ×3 IMPLANT
GLOVE BIOGEL PI INDICATOR 7.0 (GLOVE) ×2
GLOVE BIOGEL PI INDICATOR 7.5 (GLOVE) ×6
GOWN STRL REUS W/ TWL LRG LVL3 (GOWN DISPOSABLE) ×1 IMPLANT
GOWN STRL REUS W/ TWL XL LVL3 (GOWN DISPOSABLE) ×2 IMPLANT
GOWN STRL REUS W/TWL LRG LVL3 (GOWN DISPOSABLE) ×3
GOWN STRL REUS W/TWL XL LVL3 (GOWN DISPOSABLE) ×6
HEMOSTAT SNOW SURGICEL 2X4 (HEMOSTASIS) ×3 IMPLANT
HEMOSTAT SPONGE AVITENE ULTRA (HEMOSTASIS) IMPLANT
KIT BASIN OR (CUSTOM PROCEDURE TRAY) ×3 IMPLANT
KIT TURNOVER KIT B (KITS) ×3 IMPLANT
LOOP VESSEL MINI RED (MISCELLANEOUS) IMPLANT
NS IRRIG 1000ML POUR BTL (IV SOLUTION) ×3 IMPLANT
PACK CV ACCESS (CUSTOM PROCEDURE TRAY) ×3 IMPLANT
PAD ARMBOARD 7.5X6 YLW CONV (MISCELLANEOUS) ×6 IMPLANT
SPONGE LAP 18X18 RF (DISPOSABLE) ×3 IMPLANT
SUT PROLENE 5 0 C 1 24 (SUTURE) ×15 IMPLANT
SUT PROLENE 6 0 BV (SUTURE) ×3 IMPLANT
SUT PROLENE 6 0 CC (SUTURE) IMPLANT
SUT PROLENE 7 0 BV 1 (SUTURE) IMPLANT
SUT VIC AB 3-0 SH 27 (SUTURE) ×6
SUT VIC AB 3-0 SH 27X BRD (SUTURE) ×2 IMPLANT
SUT VICRYL 4-0 PS2 18IN ABS (SUTURE) ×6 IMPLANT
TOWEL GREEN STERILE (TOWEL DISPOSABLE) ×3 IMPLANT
UNDERPAD 30X30 (UNDERPADS AND DIAPERS) ×3 IMPLANT
WATER STERILE IRR 1000ML POUR (IV SOLUTION) ×3 IMPLANT

## 2019-04-08 NOTE — Progress Notes (Addendum)
  [name] Progress Note   Subjective:   Patient is a 53 year old male with a history of ESRD on dialysis who presented to short stay today for plication of left arm AV fistula for aneurysmal degeneration. Nephrology is consulted today for hyperkalemia. Patient's K+ is 6.6 on pre-op testing. Patient reports he is not very compliant with fluid restrictions or renal diet. He has taken lokelma in the past for high potassium. His BP is also elevated today and he is above his EDW of 100kg. He is concerned about being admitted to the hospital due to associated costs. Otherwise, he reports no concerns. Denies weakness, SOB, dyspnea, CP, palpitations, abdominal pain, N/V/D. His last dialysis session was yesterday. He did attend a full session and left 0.6kg above his EDW.   Objective Vitals:   04/07/19 1111 04/08/19 0714  BP:  (!) 169/104  Pulse:  78  Resp:  20  Temp:  97.6 F (36.4 C)  SpO2:  99%  Weight: 104.3 kg   Height: 6\' 3"  (1.905 m)    Physical Exam General: Well developed male, alert and in NAD Heart: RRR, no murmurs, rubs or gallops Lungs: CTA bilaterally without wheezing, rhonchi or rales Abdomen: Soft, non-tender, non-distended. + BS Extremities: no edema b/l lower extremities Dialysis Access:  L AVF + bruit, 3 large aneurysms present  Additional Objective Labs: Basic Metabolic Panel: Recent Labs  Lab 04/08/19 0756 04/08/19 0802  NA 134*  --   K 6.5* 6.6*  GLUCOSE 90  --    Liver Function Tests: No results for input(s): AST, ALT, ALKPHOS, BILITOT, PROT, ALBUMIN in the last 168 hours. No results for input(s): LIPASE, AMYLASE in the last 168 hours. CBC: Recent Labs  Lab 04/08/19 0756  HGB 10.9*  HCT 32.0*   Medications: . sodium chloride 10 mL/hr at 04/08/19 0748  .  ceFAZolin (ANCEF) IV     . Chlorhexidine Gluconate Cloth  6 each Topical Q0600    Dialysis Orders: [name] on MWF EDW 100kg; T 4hr 71min; 180NRe; BFR 450; BFR Auto 1.5;  2K/2Ca Heparin 5000 unit bolus Venofer 100mg  IV q HD (stop date 8/24) Mircera 75 mcg IV q2wks Hectorol 22mcg 3x week with HD Parsabiv 7.5 mg IV q HD  Assessment/Plan: 1. Hyperkalemia: HD today next shift. 1K bath for 45 minutes then 2K bath. Patient is also above his EDW and hypertensive. Will plan UFG 3L.  2. ESRD: MWF- extra treatment today for hyperkalemia. For plication of L arm AV fistula after dialysis today.  Resume MWF at outpatient unit tomorrow if discharged.  3. HTN/volume:  No SOB/edema on exam however patient is hypertensive and above his EDW. UFG 3L.  4. Anemia:  Hgb 10.9. Resume mircera and venofer at discharge. 5. Secondary hyperparathyroidism:  Continue hectorol, parsabiv and binder at discharge. Will order RFP if patient is admitted. 6. Nutrition: Renal diet with fluid restrictions.     Marco Paganini, PA-C 04/08/2019, 9:48 AM  [name] Pager: [phone]  I have seen and examined this patient and agree with plan and assessment in the above note with renal recommendations/intervention highlighted.  It is unclear why his K was so high after HD yesterday and he does admit to eating tomatoes.  Plan for urgent HD with low K bath and hopefully he can undergo surgery later today. Marco John A Whitten Andreoni,MD 04/08/2019 1:55 PM

## 2019-04-08 NOTE — Progress Notes (Signed)
Pt transported by nurse to HD unit, on monitor, stable condition.

## 2019-04-08 NOTE — Procedures (Signed)
I was present at this dialysis session. I have reviewed the session itself and made appropriate changes.   Vital signs in last 24 hours:  Temp:  [97.6 F (36.4 C)-97.7 F (36.5 C)] 97.7 F (36.5 C) (08/11 1015) Pulse Rate:  [75-82] 82 (08/11 1330) Resp:  [20] 20 (08/11 1015) BP: (130-169)/(57-104) 134/57 (08/11 1330) SpO2:  [98 %-99 %] 98 % (08/11 1015) Weight:  [102.6 kg] 102.6 kg (08/11 1015) Weight change:  Filed Weights   04/07/19 1111 04/08/19 1015  Weight: 104.3 kg 102.6 kg    Recent Labs  Lab 04/08/19 0756 04/08/19 0802  NA 134*  --   K 6.5* 6.6*  GLUCOSE 90  --     Recent Labs  Lab 04/08/19 0756  HGB 10.9*  HCT 32.0*    Scheduled Meds: . Chlorhexidine Gluconate Cloth  6 each Topical Q0600  . pantoprazole  40 mg Oral Daily  . potassium chloride  20-40 mEq Oral Once   Continuous Infusions: . sodium chloride 10 mL/hr at 04/08/19 0748  .  ceFAZolin (ANCEF) IV     PRN Meds:.alum & mag hydroxide-simeth, guaiFENesin-dextromethorphan, hydrALAZINE, labetalol, metoprolol tartrate, ondansetron, phenol   Donetta Potts,  MD 04/08/2019, 1:56 PM

## 2019-04-08 NOTE — Progress Notes (Signed)
CRITICAL VALUE ALERT  Critical Value:  K 6.6  Date & Time Notied:  04/08/19 0920am  Provider Notified: Lissa Hoard MD  Orders Received/Actions taken: notify surgeon, Oneida Alar

## 2019-04-08 NOTE — Interval H&P Note (Signed)
History and Physical Interval Note:  04/08/2019 8:48 PM  Marco Arnold  has presented today for surgery, with the diagnosis of left arm fistula pseudoaneurysm.  The various methods of treatment have been discussed with the patient and family. After consideration of risks, benefits and other options for treatment, the patient has consented to  Procedure(s): REVISION PLICATION OF LEFT ARM ARTERIOVENOUS FISTULA (Left) as a surgical intervention.  The patient's history has been reviewed, patient examined, no change in status, stable for surgery.  I have reviewed the patient's chart and labs.  Questions were answered to the patient's satisfaction.     [name]

## 2019-04-08 NOTE — Transfer of Care (Signed)
Immediate Anesthesia Transfer of Care Note  Patient: Marco Arnold  Procedure(s) Performed: REVISION PLICATION OF LEFT ARM ARTERIOVENOUS FISTULA (Left )  Patient Location: PACU  Anesthesia Type:General  Level of Consciousness: awake, alert  and oriented  Airway & Oxygen Therapy: Patient connected to nasal cannula oxygen  Post-op Assessment: Report given to RN and Post -op Vital signs reviewed and stable  Post vital signs: Reviewed and stable  Last Vitals:  Vitals Value Taken Time  BP    Temp    Pulse    Resp    SpO2      Last Pain:  Vitals:   04/08/19 1452  TempSrc: Oral  PainSc: 0-No pain      Patients Stated Pain Goal: 4 (18/29/93 7169)  Complications: No apparent anesthesia complications

## 2019-04-08 NOTE — Op Note (Signed)
    Patient name: Marco Arnold MRN: [id number] DOB: 08/14/1966 Sex: male  04/08/2019 Pre-operative Diagnosis: left BCF aneurysm x 2 Post-operative diagnosis:  Same Surgeon:  [name] Assistants: Arlee Muslim Procedure:   Plication of fistula aneurysm x2 Anesthesia: General Blood Loss: 100 cc Specimens: None  Findings: 2 large aneurysms were noted on the fistula with thinning of the skin anteriorly.  Separate elliptical incisions were made over the fistula and the compromised skin.  I resected approximately half of the aneurysm, including the overlying skin.  Each aneurysm was plicated.  The cephalic vein fistula was heavily calcified.  Indications: The patient was originally scheduled to have 2 fistula aneurysms plicated because the overlying skin was compromised.  He was found to have an elevated potassium this morning and so he was dialyzed.  After dialysis he wished to have these repaired prior to discharge.  Procedure:  The patient was identified in the holding area and taken to [place] [facility]  The patient was then placed supine on the table. general anesthesia was administered.  The patient was prepped and draped in the usual sterile fashion.  A time out was called and antibiotics were administered.  1% lidocaine was used for local anesthesia.  2 separate elliptical incisions were performed over top of each of the 2 aneurysms, incorporating the compromised skin.  Through each of the incisions, the fistula was isolated proximally and distally.  Once I had adequate exposure, the fistula was occluded.  I used a 10 blade to resect approximately 50% of each aneurysm, including the compromised skin.  The fistula was heavily calcified.  Exophytic plaque was removed from the inside of the vein.  I then plicated the fistula by closing the remaining 50% of each aneurysm running 5-0 Prolene in 2 layers.  After completion and appropriate flushing maneuvers, blood flow was reestablished through the  fistula.  Both areas remained hemostatic.  The wound was irrigated.  The skin was then closed over top of the fistula and 2 layers of Vicryl followed by Dermabond.  At the end of the procedure there was a palpable thrill within the fistula.   Disposition: To PACU stable   V. [name], M.D., [facility] [place] Office: [phone] Pager:  [phone]

## 2019-04-08 NOTE — Anesthesia Procedure Notes (Addendum)
Procedure Name: LMA Insertion Date/Time: 04/08/2019 9:31 PM Performed by: Valetta Fuller, CRNA Pre-anesthesia Checklist: Patient identified, Emergency Drugs available, Suction available and Patient being monitored Patient Re-evaluated:Patient Re-evaluated prior to induction Oxygen Delivery Method: Circle system utilized Preoxygenation: Pre-oxygenation with 100% oxygen Induction Type: IV induction LMA: LMA inserted LMA Size: 5.0 Number of attempts: 1 Placement Confirmation: positive ETCO2 and breath sounds checked- equal and bilateral Tube secured with: Tape Dental Injury: Teeth and Oropharynx as per pre-operative assessment  Comments: LMA inserted by Dr. Conrad Macdoel, MDA

## 2019-04-08 NOTE — Discharge Instructions (Signed)
° °  [facility] [place] ° °Discharge Instructions ° °AV Fistula or Graft Surgery for Dialysis Access ° °Please refer to the following instructions for your post-procedure care. Your surgeon or physician assistant will discuss any changes with you. ° °Activity ° °You may drive the day following your surgery, if you are comfortable and no longer taking prescription pain medication. Resume full activity as the soreness in your incision resolves. ° °Bathing/Showering ° °You may shower after you go home. Keep your incision dry for 48 hours. Do not soak in a bathtub, hot tub, or swim until the incision heals completely. You may not shower if you have a hemodialysis catheter. ° °Incision Care ° °Clean your incision with mild soap and water after 48 hours. Pat the area dry with a clean towel. You do not need a bandage unless otherwise instructed. Do not apply any ointments or creams to your incision. You may have skin glue on your incision. Do not peel it off. It will come off on its own in about one week. Your arm may swell a bit after surgery. To reduce swelling use pillows to elevate your arm so it is above your heart. Your doctor will tell you if you need to lightly wrap your arm with an ACE bandage. ° °Diet ° °Resume your normal diet. There are not special food restrictions following this procedure. In order to heal from your surgery, it is CRITICAL to get adequate nutrition. Your body requires vitamins, minerals, and protein. Vegetables are the best source of vitamins and minerals. Vegetables also provide the perfect balance of protein. Processed food has little nutritional value, so try to avoid this. ° °Medications ° °Resume taking all of your medications. If your incision is causing pain, you may take over-the counter pain relievers such as acetaminophen (Tylenol). If you were prescribed a stronger pain medication, please be aware these medications can cause nausea and constipation. Prevent  nausea by taking the medication with a snack or meal. Avoid constipation by drinking plenty of fluids and eating foods with high amount of fiber, such as fruits, vegetables, and grains. Do not take Tylenol if you are taking prescription pain medications. ° ° ° ° °Follow up °Your surgeon may want to see you in the office following your access surgery. If so, this will be arranged at the time of your surgery. ° °Please call us immediately for any of the following conditions: ° °Increased pain, redness, drainage (pus) from your incision site °Fever of 101 degrees or higher °Severe or worsening pain at your incision site °Hand pain or numbness. ° °Reduce your risk of vascular disease: ° °Stop smoking. If you would like help, call [facility] at 1-800-QUIT-NOW (1-800-784-8669) or  at 336-586-4000 ° °Manage your cholesterol °Maintain a desired weight °Control your diabetes °Keep your blood pressure down ° °Dialysis ° °It will take several weeks to several months for your new dialysis access to be ready for use. Your surgeon will determine when it is OK to use it. Your nephrologist will continue to direct your dialysis. You can continue to use your Permcath until your new access is ready for use. ° °If you have any questions, please call the office at [phone]. ° °

## 2019-04-08 NOTE — Progress Notes (Signed)
Pt seen in dialysis.  Discussed with him that an emergency has come in that will need to go before him in the operating room.  Gave pt option of rescheduling or waiting.  He wants to wait and understands that it could be much later this evening before his surgery is done and could be bumped again if another emergency comes in.    Leontine Locket, [facility] 04/08/2019 2:52 PM

## 2019-04-09 ENCOUNTER — Encounter (HOSPITAL_COMMUNITY): Payer: Self-pay | Admitting: Surgery

## 2019-04-09 DIAGNOSIS — D509 Iron deficiency anemia, unspecified: Secondary | ICD-10-CM | POA: Diagnosis not present

## 2019-04-09 DIAGNOSIS — D631 Anemia in chronic kidney disease: Secondary | ICD-10-CM | POA: Diagnosis not present

## 2019-04-09 DIAGNOSIS — Z992 Dependence on renal dialysis: Secondary | ICD-10-CM | POA: Diagnosis not present

## 2019-04-09 DIAGNOSIS — N2581 Secondary hyperparathyroidism of renal origin: Secondary | ICD-10-CM | POA: Diagnosis not present

## 2019-04-09 DIAGNOSIS — S41102A Unspecified open wound of left upper arm, initial encounter: Secondary | ICD-10-CM | POA: Diagnosis not present

## 2019-04-09 DIAGNOSIS — N186 End stage renal disease: Secondary | ICD-10-CM | POA: Diagnosis not present

## 2019-04-10 NOTE — Anesthesia Postprocedure Evaluation (Signed)
Anesthesia Post Note  Patient: Marco Arnold  Procedure(s) Performed: REVISION PLICATION OF LEFT ARM ARTERIOVENOUS FISTULA (Left )     Patient location during evaluation: PACU Anesthesia Type: General Level of consciousness: awake and alert Pain management: pain level controlled Vital Signs Assessment: post-procedure vital signs reviewed and stable Respiratory status: spontaneous breathing, nonlabored ventilation, respiratory function stable and patient connected to nasal cannula oxygen Cardiovascular status: blood pressure returned to baseline and stable Postop Assessment: no apparent nausea or vomiting Anesthetic complications: no    Last Vitals:  Vitals:   04/08/19 2315 04/08/19 2330  BP: (!) 142/84   Pulse: 80 80  Resp: 20 15  Temp:  36.9 C  SpO2: 91% 90%    Last Pain:  Vitals:   04/08/19 2315  TempSrc:   PainSc: 6                  Stephaie Dardis DAVID

## 2019-04-10 NOTE — Discharge Summary (Signed)
Physician Discharge Summary   Patient ID: Marco Arnold [id number] 53 y.o. 11/10/1965  Admit date: 04/08/2019  Discharge date and time: 04/09/2019 12:05 AM   Admitting Physician: Elam Dutch, MD   Discharge Physician: Dr. Trula Slade  Admission Diagnoses: left arm fistula pseudoaneurysm  Discharge Diagnoses: ESRD  Admission Condition: fair  Discharged Condition: fair  Indication for Admission: Left arm AV fistula pseudoaneurysm  Hospital Course: Marco Arnold is a 53 year old male who came in as an outpatient for plication of left arm AV fistula pseudoaneurysms.  Preoperative labs demonstrated potassium of 6.6.  Due to a potassium greater than 6 anesthesia did not want to proceed with surgery prior to dialysis.  Nephrology was consulted for inpatient dialysis treatment.  After treatment was concluded patient was brought to the operating room for scheduled plication.  He tolerated this procedure well and was discharged from PACU.  He will follow-up in office in about 2 weeks for incision check.  Fistula will be able to be accessed immediately avoiding the area of the incision for at least 4 weeks.  Discharge instructions were provided for the patient.  He was discharged in stable condition.  Consults: nephrology  Treatments: surgery: Plication of left arm AV fistula  Discharge Exam: See operative note Vitals:   04/08/19 2315 04/08/19 2330  BP: (!) 142/84   Pulse: 80 80  Resp: 20 15  Temp:  98.4 F (36.9 C)  SpO2: 91% 90%     Disposition: Home  Patient Instructions:  Allergies as of 04/09/2019      Reactions   Lisinopril Shortness Of Breath   Unable to breath      Medication List    TAKE these medications   amLODipine 10 MG tablet Commonly known as: NORVASC Take 10 mg by mouth at bedtime.   aspirin EC 81 MG tablet Take 81 mg by mouth daily.   atorvastatin 80 MG tablet Commonly known as: LIPITOR Take 80 mg by mouth daily.   carvedilol 3.125 MG  tablet Commonly known as: COREG Take 3.125 mg by mouth 2 (two) times daily with a meal.   cloNIDine 0.1 MG tablet Commonly known as: CATAPRES Take 0.2 mg by mouth daily.   cyclobenzaprine 10 MG tablet Commonly known as: FLEXERIL Take 20 mg by mouth 2 (two) times daily as needed for muscle spasms.   DULoxetine 60 MG capsule Commonly known as: CYMBALTA Take 60 mg by mouth every morning.   Fosrenol 1000 MG chewable tablet Generic drug: lanthanum Chew 6,000 mg by mouth 3 (three) times daily with meals.   gabapentin 100 MG capsule Commonly known as: NEURONTIN Take 3 capsules (300 mg total) by mouth at bedtime. What changed:   when to take this  reasons to take this   HYDROcodone-acetaminophen 5-325 MG tablet Commonly known as: Norco Take 1 tablet by mouth every 6 (six) hours as needed for moderate pain.   hydrOXYzine 25 MG tablet Commonly known as: ATARAX/VISTARIL Take 25 mg by mouth 2 (two) times daily as needed for anxiety.   multivitamin Tabs tablet Take 1 tablet by mouth every evening.   naproxen sodium 220 MG tablet Commonly known as: ALEVE Take 220 mg by mouth daily as needed (pain).   nortriptyline 25 MG capsule Commonly known as: PAMELOR Take 25 mg by mouth every morning.   pantoprazole 40 MG tablet Commonly known as: PROTONIX Take 40 mg by mouth as needed (heartburn).   sildenafil 25 MG tablet Commonly known as: VIAGRA Take 25 mg  by mouth daily as needed for erectile dysfunction.   Ventolin HFA 108 (90 Base) MCG/ACT inhaler Generic drug: albuterol Inhale 2 puffs into the lungs every 6 (six) hours as needed for wheezing or shortness of breath.      Activity: activity as tolerated Diet: renal diet Wound Care: keep wound clean and dry  Follow-up with Dr. Oneida Alar in 2 weeks.  SignedDagoberto Ligas 04/10/2019 9:13 AM

## 2019-04-11 DIAGNOSIS — N2581 Secondary hyperparathyroidism of renal origin: Secondary | ICD-10-CM | POA: Diagnosis not present

## 2019-04-11 DIAGNOSIS — D509 Iron deficiency anemia, unspecified: Secondary | ICD-10-CM | POA: Diagnosis not present

## 2019-04-11 DIAGNOSIS — Z992 Dependence on renal dialysis: Secondary | ICD-10-CM | POA: Diagnosis not present

## 2019-04-11 DIAGNOSIS — D631 Anemia in chronic kidney disease: Secondary | ICD-10-CM | POA: Diagnosis not present

## 2019-04-11 DIAGNOSIS — N186 End stage renal disease: Secondary | ICD-10-CM | POA: Diagnosis not present

## 2019-04-11 DIAGNOSIS — S41102A Unspecified open wound of left upper arm, initial encounter: Secondary | ICD-10-CM | POA: Diagnosis not present

## 2019-04-14 DIAGNOSIS — N186 End stage renal disease: Secondary | ICD-10-CM | POA: Diagnosis not present

## 2019-04-14 DIAGNOSIS — D631 Anemia in chronic kidney disease: Secondary | ICD-10-CM | POA: Diagnosis not present

## 2019-04-14 DIAGNOSIS — D509 Iron deficiency anemia, unspecified: Secondary | ICD-10-CM | POA: Diagnosis not present

## 2019-04-14 DIAGNOSIS — Z992 Dependence on renal dialysis: Secondary | ICD-10-CM | POA: Diagnosis not present

## 2019-04-14 DIAGNOSIS — S41102A Unspecified open wound of left upper arm, initial encounter: Secondary | ICD-10-CM | POA: Diagnosis not present

## 2019-04-14 DIAGNOSIS — N2581 Secondary hyperparathyroidism of renal origin: Secondary | ICD-10-CM | POA: Diagnosis not present

## 2019-04-16 DIAGNOSIS — S41102A Unspecified open wound of left upper arm, initial encounter: Secondary | ICD-10-CM | POA: Diagnosis not present

## 2019-04-16 DIAGNOSIS — D631 Anemia in chronic kidney disease: Secondary | ICD-10-CM | POA: Diagnosis not present

## 2019-04-16 DIAGNOSIS — Z992 Dependence on renal dialysis: Secondary | ICD-10-CM | POA: Diagnosis not present

## 2019-04-16 DIAGNOSIS — N2581 Secondary hyperparathyroidism of renal origin: Secondary | ICD-10-CM | POA: Diagnosis not present

## 2019-04-16 DIAGNOSIS — N186 End stage renal disease: Secondary | ICD-10-CM | POA: Diagnosis not present

## 2019-04-16 DIAGNOSIS — D509 Iron deficiency anemia, unspecified: Secondary | ICD-10-CM | POA: Diagnosis not present

## 2019-04-18 DIAGNOSIS — N186 End stage renal disease: Secondary | ICD-10-CM | POA: Diagnosis not present

## 2019-04-18 DIAGNOSIS — D631 Anemia in chronic kidney disease: Secondary | ICD-10-CM | POA: Diagnosis not present

## 2019-04-18 DIAGNOSIS — D509 Iron deficiency anemia, unspecified: Secondary | ICD-10-CM | POA: Diagnosis not present

## 2019-04-18 DIAGNOSIS — S41102A Unspecified open wound of left upper arm, initial encounter: Secondary | ICD-10-CM | POA: Diagnosis not present

## 2019-04-18 DIAGNOSIS — N2581 Secondary hyperparathyroidism of renal origin: Secondary | ICD-10-CM | POA: Diagnosis not present

## 2019-04-18 DIAGNOSIS — Z992 Dependence on renal dialysis: Secondary | ICD-10-CM | POA: Diagnosis not present

## 2019-04-21 DIAGNOSIS — D631 Anemia in chronic kidney disease: Secondary | ICD-10-CM | POA: Diagnosis not present

## 2019-04-21 DIAGNOSIS — D509 Iron deficiency anemia, unspecified: Secondary | ICD-10-CM | POA: Diagnosis not present

## 2019-04-21 DIAGNOSIS — N2581 Secondary hyperparathyroidism of renal origin: Secondary | ICD-10-CM | POA: Diagnosis not present

## 2019-04-21 DIAGNOSIS — S41102A Unspecified open wound of left upper arm, initial encounter: Secondary | ICD-10-CM | POA: Diagnosis not present

## 2019-04-21 DIAGNOSIS — N186 End stage renal disease: Secondary | ICD-10-CM | POA: Diagnosis not present

## 2019-04-21 DIAGNOSIS — Z992 Dependence on renal dialysis: Secondary | ICD-10-CM | POA: Diagnosis not present

## 2019-04-23 DIAGNOSIS — S41102A Unspecified open wound of left upper arm, initial encounter: Secondary | ICD-10-CM | POA: Diagnosis not present

## 2019-04-23 DIAGNOSIS — D509 Iron deficiency anemia, unspecified: Secondary | ICD-10-CM | POA: Diagnosis not present

## 2019-04-23 DIAGNOSIS — D631 Anemia in chronic kidney disease: Secondary | ICD-10-CM | POA: Diagnosis not present

## 2019-04-23 DIAGNOSIS — N186 End stage renal disease: Secondary | ICD-10-CM | POA: Diagnosis not present

## 2019-04-23 DIAGNOSIS — Z992 Dependence on renal dialysis: Secondary | ICD-10-CM | POA: Diagnosis not present

## 2019-04-23 DIAGNOSIS — N2581 Secondary hyperparathyroidism of renal origin: Secondary | ICD-10-CM | POA: Diagnosis not present

## 2019-04-24 DIAGNOSIS — S41102A Unspecified open wound of left upper arm, initial encounter: Secondary | ICD-10-CM | POA: Diagnosis not present

## 2019-04-24 DIAGNOSIS — N186 End stage renal disease: Secondary | ICD-10-CM | POA: Diagnosis not present

## 2019-04-24 DIAGNOSIS — Z992 Dependence on renal dialysis: Secondary | ICD-10-CM | POA: Diagnosis not present

## 2019-04-24 DIAGNOSIS — N2581 Secondary hyperparathyroidism of renal origin: Secondary | ICD-10-CM | POA: Diagnosis not present

## 2019-04-24 DIAGNOSIS — D631 Anemia in chronic kidney disease: Secondary | ICD-10-CM | POA: Diagnosis not present

## 2019-04-24 DIAGNOSIS — D509 Iron deficiency anemia, unspecified: Secondary | ICD-10-CM | POA: Diagnosis not present

## 2019-04-25 ENCOUNTER — Telehealth: Payer: Self-pay

## 2019-04-25 DIAGNOSIS — N2581 Secondary hyperparathyroidism of renal origin: Secondary | ICD-10-CM | POA: Diagnosis not present

## 2019-04-25 DIAGNOSIS — D631 Anemia in chronic kidney disease: Secondary | ICD-10-CM | POA: Diagnosis not present

## 2019-04-25 DIAGNOSIS — D509 Iron deficiency anemia, unspecified: Secondary | ICD-10-CM | POA: Diagnosis not present

## 2019-04-25 DIAGNOSIS — N186 End stage renal disease: Secondary | ICD-10-CM | POA: Diagnosis not present

## 2019-04-25 DIAGNOSIS — S41102A Unspecified open wound of left upper arm, initial encounter: Secondary | ICD-10-CM | POA: Diagnosis not present

## 2019-04-25 DIAGNOSIS — Z992 Dependence on renal dialysis: Secondary | ICD-10-CM | POA: Diagnosis not present

## 2019-04-25 NOTE — Progress Notes (Signed)
POST OPERATIVE OFFICE NOTE    CC:  F/u for surgery  HPI:  This is a 53 y.o. male who is s/p plication of left arm fistula aneurysm x 2 on 04/08/2019 by Dr. Trula Slade.  He returns today for follow up.    He states that he started getting some redness and drainage from the left arm surgical site last week.  He states he went to the ER on Saturday.  He was seen by Dr. Scot Dock and it was recommended to continue his IV abx at HD and keep his appt today.    He states that after he had a gush of some bloody fluid out of the wound, his arm started to feel better.  He states he has gotten some liquid abx and IV abx at HD.  He states that the top of the incision is draining clear liquid.  He has not had any fevers.    He does not have any evidence of steal sx.  Allergies  Allergen Reactions  . Lisinopril Shortness Of Breath    Unable to breath    Current Outpatient Medications  Medication Sig Dispense Refill  . amLODipine (NORVASC) 10 MG tablet Take 10 mg by mouth at bedtime.     Marland Kitchen aspirin EC 81 MG tablet Take 81 mg by mouth daily.    Marland Kitchen atorvastatin (LIPITOR) 80 MG tablet Take 80 mg by mouth daily.     . carvedilol (COREG) 3.125 MG tablet Take 3.125 mg by mouth 2 (two) times daily with a meal.     . cloNIDine (CATAPRES) 0.1 MG tablet Take 0.2 mg by mouth daily.    . cyclobenzaprine (FLEXERIL) 10 MG tablet Take 20 mg by mouth 2 (two) times daily as needed for muscle spasms.     . DULoxetine (CYMBALTA) 60 MG capsule Take 60 mg by mouth every morning.   2  . FOSRENOL 1000 MG chewable tablet Chew 6,000 mg by mouth 3 (three) times daily with meals.   11  . gabapentin (NEURONTIN) 100 MG capsule Take 3 capsules (300 mg total) by mouth at bedtime. (Patient taking differently: Take 300 mg by mouth at bedtime as needed (pain). ) 60 capsule 0  . HYDROcodone-acetaminophen (NORCO) 5-325 MG tablet Take 1 tablet by mouth every 6 (six) hours as needed for moderate pain. 15 tablet 0  . hydrOXYzine  (ATARAX/VISTARIL) 25 MG tablet Take 25 mg by mouth 2 (two) times daily as needed for anxiety.    . multivitamin (RENA-VIT) TABS tablet Take 1 tablet by mouth every evening.    . naproxen sodium (ALEVE) 220 MG tablet Take 220 mg by mouth daily as needed (pain).     . nortriptyline (PAMELOR) 25 MG capsule Take 25 mg by mouth every morning.   2  . pantoprazole (PROTONIX) 40 MG tablet Take 40 mg by mouth as needed (heartburn).     . sildenafil (VIAGRA) 25 MG tablet Take 25 mg by mouth daily as needed for erectile dysfunction.    . VENTOLIN HFA 108 (90 Base) MCG/ACT inhaler Inhale 2 puffs into the lungs every 6 (six) hours as needed for wheezing or shortness of breath.      No current facility-administered medications for this visit.      ROS:  See HPI  Physical Exam:  Today's Vitals   04/28/19 1302  BP: (!) 151/84  Pulse: 78  Resp: 12  Temp: 97.8 F (36.6 C)  TempSrc: Temporal  SpO2: 100%  Weight: 224 lb (101.6  kg)  Height: 6\' 3"  (1.905 m)  PainSc: 6    Body mass index is 28 kg/m.   Incision:       Assessment/Plan:  This is a 53 y.o. male who is s/p: plication of left arm fistula aneurysm x 2 on 04/08/2019 by Dr. Trula Slade.  -pt doing well and erythema has improved since being on abx and in the ER on Saturday (compared to picture in Dr. Nicole Cella note) -I spoke with Elmyra Ricks at the center on [employer] and will continue IV vanc for another 2 weeks on HD.  We will see him back in 2-3 weeks to check his incision in the [facility] clinic.  He will call sooner should he have any issues.    Leontine Locket, PA-C [facility] [phone]  Clinic MD:  Trula Slade

## 2019-04-25 NOTE — Telephone Encounter (Signed)
Marco Arnold with [facility] called and said that patient was in today and he had some serous drainage from his fistula and there seemed to be a little bit of pus in it as well as blood. She said that it was red but patient is not complaining of pain and there is no heat at the area. She said that they have treated him with Vancomycin and a second abx and they will do this again next week at dialysis. Just wanted to let us know  [name], [name]

## 2019-04-26 ENCOUNTER — Emergency Department (HOSPITAL_COMMUNITY)
Admission: EM | Admit: 2019-04-26 | Discharge: 2019-04-26 | Disposition: A | Discharge status: Home / Self Care | Payer: Medicare Other | Attending: Emergency Medicine | Admitting: Emergency Medicine

## 2019-04-26 ENCOUNTER — Encounter (HOSPITAL_COMMUNITY): Payer: Self-pay

## 2019-04-26 ENCOUNTER — Other Ambulatory Visit: Payer: Self-pay

## 2019-04-26 DIAGNOSIS — Z992 Dependence on renal dialysis: Secondary | ICD-10-CM | POA: Insufficient documentation

## 2019-04-26 DIAGNOSIS — Z7982 Long term (current) use of aspirin: Secondary | ICD-10-CM | POA: Insufficient documentation

## 2019-04-26 DIAGNOSIS — I120 Hypertensive chronic kidney disease with stage 5 chronic kidney disease or end stage renal disease: Secondary | ICD-10-CM | POA: Insufficient documentation

## 2019-04-26 DIAGNOSIS — N186 End stage renal disease: Secondary | ICD-10-CM | POA: Diagnosis not present

## 2019-04-26 DIAGNOSIS — F17210 Nicotine dependence, cigarettes, uncomplicated: Secondary | ICD-10-CM | POA: Diagnosis not present

## 2019-04-26 DIAGNOSIS — Z79899 Other long term (current) drug therapy: Secondary | ICD-10-CM | POA: Insufficient documentation

## 2019-04-26 DIAGNOSIS — I2510 Atherosclerotic heart disease of native coronary artery without angina pectoris: Secondary | ICD-10-CM | POA: Insufficient documentation

## 2019-04-26 DIAGNOSIS — L03818 Cellulitis of other sites: Secondary | ICD-10-CM | POA: Diagnosis not present

## 2019-04-26 DIAGNOSIS — Z452 Encounter for adjustment and management of vascular access device: Secondary | ICD-10-CM | POA: Diagnosis present

## 2019-04-26 DIAGNOSIS — T82898A Other specified complication of vascular prosthetic devices, implants and grafts, initial encounter: Secondary | ICD-10-CM

## 2019-04-26 DIAGNOSIS — L03114 Cellulitis of left upper limb: Secondary | ICD-10-CM | POA: Diagnosis not present

## 2019-04-26 LAB — COMPREHENSIVE METABOLIC PANEL
ALT: 21 U/L (ref 0–44)
AST: 24 U/L (ref 15–41)
Albumin: 3.3 g/dL — ABNORMAL LOW (ref 3.5–5.0)
Alkaline Phosphatase: 69 U/L (ref 38–126)
Anion gap: 16 — ABNORMAL HIGH (ref 5–15)
BUN: 41 mg/dL — ABNORMAL HIGH (ref 6–20)
CO2: 28 mmol/L (ref 22–32)
Calcium: 10 mg/dL (ref 8.9–10.3)
Chloride: 91 mmol/L — ABNORMAL LOW (ref 98–111)
Creatinine, Ser: 6.57 mg/dL — ABNORMAL HIGH (ref 0.61–1.24)
GFR calc Af Amer: 10 mL/min — ABNORMAL LOW (ref >60)
GFR calc non Af Amer: 9 mL/min — ABNORMAL LOW (ref >60)
Glucose, Bld: 140 mg/dL — ABNORMAL HIGH (ref 70–99)
Potassium: 5.1 mmol/L (ref 3.5–5.1)
Sodium: 135 mmol/L (ref 135–145)
Total Bilirubin: 1.1 mg/dL (ref 0.3–1.2)
Total Protein: 7.1 g/dL (ref 6.5–8.1)

## 2019-04-26 LAB — CBC WITH DIFFERENTIAL/PLATELET
Abs Immature Granulocytes: 0.07 10*3/uL (ref 0.00–0.07)
Basophils Absolute: 0 10*3/uL (ref 0.0–0.1)
Basophils Relative: 0 %
Eosinophils Absolute: 0.6 10*3/uL — ABNORMAL HIGH (ref 0.0–0.5)
Eosinophils Relative: 6 %
HCT: 38.5 % — ABNORMAL LOW (ref 39.0–52.0)
Hemoglobin: 12.1 g/dL — ABNORMAL LOW (ref 13.0–17.0)
Immature Granulocytes: 1 %
Lymphocytes Relative: 12 %
Lymphs Abs: 1.2 10*3/uL (ref 0.7–4.0)
MCH: 31.6 pg (ref 26.0–34.0)
MCHC: 31.4 g/dL (ref 30.0–36.0)
MCV: 100.5 fL — ABNORMAL HIGH (ref 80.0–100.0)
Monocytes Absolute: 0.8 10*3/uL (ref 0.1–1.0)
Monocytes Relative: 8 %
Neutro Abs: 6.9 10*3/uL (ref 1.7–7.7)
Neutrophils Relative %: 73 %
Platelets: 220 10*3/uL (ref 150–400)
RBC: 3.83 MIL/uL — ABNORMAL LOW (ref 4.22–5.81)
RDW: 18.6 % — ABNORMAL HIGH (ref 11.5–15.5)
WBC: 9.6 10*3/uL (ref 4.0–10.5)
nRBC: 0.2 % (ref 0.0–0.2)

## 2019-04-26 LAB — PROTIME-INR
INR: 1.2 (ref 0.8–1.2)
Prothrombin Time: 15.2 seconds (ref 11.4–15.2)

## 2019-04-26 LAB — LACTIC ACID, PLASMA: Lactic Acid, Venous: 1.5 mmol/L (ref 0.5–1.9)

## 2019-04-26 MED ORDER — HYDROCODONE-ACETAMINOPHEN 5-325 MG PO TABS: 2.0000 | ORAL_TABLET | ORAL | 0 refills | Status: DC | PRN

## 2019-04-26 MED ORDER — CLINDAMYCIN PHOSPHATE 600 MG/50ML IV SOLN
600.0000 mg | Freq: Once | INTRAVENOUS | Status: AC
  Administered 2019-04-26: 600 mg via INTRAVENOUS
  Filled 2019-04-26: qty 50

## 2019-04-26 NOTE — Consult Note (Signed)
Patient name: Marco Arnold MRN: [id number] DOB: 02-21-1966 Sex: male  REASON FOR CONSULT:   Cellulitis overlying left brachiocephalic AV fistula  HPI:   Marco Arnold is a pleasant 53 y.o. male who had undergone plication of 2 aneurysms in his left brachiocephalic fistula by Dr. [name] on 04/08/2019.  He called this morning because he had some redness over the fistula and also some purulent drainage.  He tells me that he dialyzes on Monday Wednesdays and Fridays.  Fridays they did give him intravenous antibiotics at the time of dialysis.  He is scheduled to see Dr. Trula Slade in follow-up on Monday.  He was concerned because of some swelling over the distal incision with redness and a small amount of drainage.  He denies fever or chills.  No current facility-administered medications for this encounter.    Current Outpatient Medications  Medication Sig Dispense Refill  . amLODipine (NORVASC) 10 MG tablet Take 10 mg by mouth at bedtime.     Marland Kitchen aspirin EC 81 MG tablet Take 81 mg by mouth daily.    Marland Kitchen atorvastatin (LIPITOR) 80 MG tablet Take 80 mg by mouth daily.     . carvedilol (COREG) 3.125 MG tablet Take 3.125 mg by mouth 2 (two) times daily with a meal.     . cloNIDine (CATAPRES) 0.1 MG tablet Take 0.2 mg by mouth daily.    . cyclobenzaprine (FLEXERIL) 10 MG tablet Take 20 mg by mouth 2 (two) times daily as needed for muscle spasms.     . DULoxetine (CYMBALTA) 60 MG capsule Take 60 mg by mouth every morning.   2  . FOSRENOL 1000 MG chewable tablet Chew 6,000 mg by mouth 3 (three) times daily with meals.   11  . HYDROcodone-acetaminophen (NORCO) 5-325 MG tablet Take 1 tablet by mouth every 6 (six) hours as needed for moderate pain. 15 tablet 0  . hydrOXYzine (ATARAX/VISTARIL) 25 MG tablet Take 25 mg by mouth 2 (two) times daily as needed for itching (Sleep).     . multivitamin (RENA-VIT) TABS tablet Take 1 tablet by mouth every evening.    . naproxen sodium (ALEVE) 220 MG tablet Take  220 mg by mouth daily.     . nortriptyline (PAMELOR) 25 MG capsule Take 25 mg by mouth every morning.   2  . pantoprazole (PROTONIX) 40 MG tablet Take 40 mg by mouth daily.     . VENTOLIN HFA 108 (90 Base) MCG/ACT inhaler Inhale 2 puffs into the lungs every 6 (six) hours as needed for wheezing or shortness of breath.     . gabapentin (NEURONTIN) 100 MG capsule Take 3 capsules (300 mg total) by mouth at bedtime. (Patient not taking: Reported on 04/26/2019) 60 capsule 0    REVIEW OF SYSTEMS:  [X]  denotes positive finding, [ ]  denotes negative finding Vascular    Leg swelling    Cardiac    Chest pain or chest pressure:    Shortness of breath upon exertion:    Short of breath when lying flat:    Irregular heart rhythm:    Constitutional    Fever or chills:     PHYSICAL EXAM:   Vitals:   04/26/19 0608 04/26/19 0911  BP: (!) 157/91 134/88  Pulse: 86 76  Resp: 20   Temp: 98 F (36.7 C)   SpO2: 99% 96%    GENERAL: The patient is a well-nourished male, in no acute distress. The vital signs are documented above. CARDIOVASCULAR: There  is a regular rate and rhythm. PULMONARY: There is good air exchange bilaterally without wheezing or rales. VASCULAR: His fistula has a good thrill.  He does have some cellulitis over the distal incision especially with one small open area.  Currently there is no significant drainage.      DATA:   LABS: His white blood cell count is 9.6.  Potassium is 5.1.  MEDICAL ISSUES:   STATUS POST PLICATION OF 2 ANEURYSMS OF HIS LEFT ARM FISTULA: This patient does have some cellulitis over the distal incision especially.  I instructed him to keep the wounds clean and dry with soap and water and to keep a small dressing over the small open area in the distal incision.  I will also send a note to the dialysis center to continue the intravenous antibiotics now.  He will keep his appointment with Dr. Trula Slade on Monday.  Deitra Mayo [facility] [phone]

## 2019-04-26 NOTE — ED Triage Notes (Signed)
Pt states that he has surgery on his dialysis graft 3 weeks ago, pt states that two days ago he began to have redness, swelling, and drainage from site, denies fevers

## 2019-04-26 NOTE — Discharge Instructions (Addendum)
Follow-up as you have been instructed by Dr. Doren Custard

## 2019-04-26 NOTE — ED Provider Notes (Signed)
[place] EMERGENCY DEPARTMENT Provider Note   CSN: [id number] Arrival date & time: 04/26/19  0559     History   Chief Complaint Chief Complaint  Patient presents with  . Vascular Access Problem    HPI Marco Arnold is a 53 y.o. male.     53 year old male who presents due to concern for infected dialysis graft.  Patient had a dialysis graft placed 3 weeks ago and over the last few days has developed increasing erythema as well as purulent drainage.  No fever or chills.  Called the vascular surgeon and was told to come here.  No treatment use prior to arrival     Past Medical History:  Diagnosis Date  . Arthritis    spine  . Chronic kidney disease    [place] MWF  . Coronary artery disease   . Dialysis patient [facility]) 2013  . Diverticulitis   . Hyperlipidemia   . Hypertension    dr  Tobie Lords      @ [facility]  med  . Neuromuscular disorder ([place])    nerve pain - tx w/cymbalta  . Neuropathic pain 09/20/2017   Left mid-thoracic    Patient Active Problem List   Diagnosis Date Noted  . Neuropathic pain 09/20/2017  . Thoracic disc herniation 03/29/2017  . Chronic bilateral low back pain without sciatica 03/29/2017  . Other sleep disturbances 12/05/2012  . End stage renal disease ([place]) 12/05/2011  . ESRD (end stage renal disease) ([place]) 11/11/2011  . Anemia of chronic disease 11/11/2011  . Hyperparathyroidism, secondary renal ([place]) 11/11/2011  . HTN (hypertension) 11/11/2011  . Acute kidney injury ([place]) 11/06/2011    Past Surgical History:  Procedure Laterality Date  . AV FISTULA PLACEMENT  11/10/2011   Procedure: ARTERIOVENOUS (AV) FISTULA CREATION;  Surgeon: Mal Misty, MD;  Location: [place];  Service: Vascular;  Laterality: Left;  Ultrasound guided  . CARDIAC CATHETERIZATION  09/19/2018   On stent study at [facility]  . COLONOSCOPY    . INSERTION OF DIALYSIS CATHETER  02/09/2012   Procedure: INSERTION OF DIALYSIS CATHETER;  Surgeon: Mal Misty, MD;  Location: [place];  Service: Vascular;  Laterality: N/A;  . MASS EXCISION Right 09/07/2018   Procedure: EXCISION MASS RIGHT THUMB;  Surgeon: Charlotte Crumb, MD;  Location: [place];  Service: Orthopedics;  Laterality: Right;  OR AXILLARY BLOCK  . REVISON OF ARTERIOVENOUS FISTULA Left 04/08/2019   Procedure: REVISION PLICATION OF LEFT ARM ARTERIOVENOUS FISTULA;  Surgeon: Serafina Mitchell, MD;  Location: [place];  Service: Vascular;  Laterality: Left;  . SHUNTOGRAM Left 06/03/2012   Procedure: SHUNTOGRAM;  Surgeon: Conrad Brussels, MD;  Location: [facility] CATH LAB;  Service: Cardiovascular;  Laterality: Left;  . SHUNTOGRAM Left 01/02/2013   Procedure: SHUNTOGRAM;  Surgeon: Conrad Boyd, MD;  Location: [facility] CATH LAB;  Service: Cardiovascular;  Laterality: Left;        Home Medications    Prior to Admission medications   Medication Sig Start Date End Date Taking? Authorizing Provider  amLODipine (NORVASC) 10 MG tablet Take 10 mg by mouth at bedtime.     [provider]  aspirin EC 81 MG tablet Take 81 mg by mouth daily.    [provider]  atorvastatin (LIPITOR) 80 MG tablet Take 80 mg by mouth daily.  01/16/19   [provider]  carvedilol (COREG) 3.125 MG tablet Take 3.125 mg by mouth 2 (two) times daily with a meal.  01/16/19  [provider]  cloNIDine (CATAPRES) 0.1 MG tablet Take 0.2 mg by mouth daily. 07/13/16   [provider]  cyclobenzaprine (FLEXERIL) 10 MG tablet Take 20 mg by mouth 2 (two) times daily as needed for muscle spasms.     [provider]  DULoxetine (CYMBALTA) 60 MG capsule Take 60 mg by mouth every morning.  01/29/18   [provider]  FOSRENOL 1000 MG chewable tablet Chew 6,000 mg by mouth 3 (three) times daily with meals.  08/31/17   [provider]  gabapentin (NEURONTIN) 100 MG capsule Take 3 capsules (300 mg total) by mouth at bedtime. Patient taking differently: Take 300 mg by mouth at bedtime as needed  (pain).  09/20/17   Kathrynn Ducking, MD  HYDROcodone-acetaminophen (NORCO) 5-325 MG tablet Take 1 tablet by mouth every 6 (six) hours as needed for moderate pain. 04/08/19   Dagoberto Ligas, PA-C  hydrOXYzine (ATARAX/VISTARIL) 25 MG tablet Take 25 mg by mouth 2 (two) times daily as needed for anxiety.    [provider]  multivitamin (RENA-VIT) TABS tablet Take 1 tablet by mouth every evening.    [provider]  naproxen sodium (ALEVE) 220 MG tablet Take 220 mg by mouth daily as needed (pain).     [provider]  nortriptyline (PAMELOR) 25 MG capsule Take 25 mg by mouth every morning.  01/29/18   [provider]  pantoprazole (PROTONIX) 40 MG tablet Take 40 mg by mouth as needed (heartburn).  12/04/12   [provider]  sildenafil (VIAGRA) 25 MG tablet Take 25 mg by mouth daily as needed for erectile dysfunction.    [provider]  VENTOLIN HFA 108 (90 Base) MCG/ACT inhaler Inhale 2 puffs into the lungs every 6 (six) hours as needed for wheezing or shortness of breath.  12/21/17   [provider]    Family History Family History  Problem Relation Age of Onset  . Pancreatic cancer Mother   . Diabetes Father   . Hypertension Father   . Cancer - Lung Paternal Grandfather     Social History Social History   Tobacco Use  . Smoking status: Light Tobacco Smoker    Packs/day: 0.25    Years: 43.00    Pack years: 10.75    Types: Cigarettes  . Smokeless tobacco: Never Used  . Tobacco comment: pt states that he is trying to quit--is trying to cut back at the current time  Substance Use Topics  . Alcohol use: Yes    Comment: occasional wine  . Drug use: Yes    Types: Marijuana    Comment: Last dose Friday 04/04/19     Allergies   Lisinopril   Review of Systems Review of Systems  All other systems reviewed and are negative.    Physical Exam Updated Vital Signs BP (!) 157/91   Pulse 86   Temp 98 F (36.7 C)   Resp 20    SpO2 99%   Physical Exam Vitals signs and nursing note reviewed.  Constitutional:      General: He is not in acute distress.    Appearance: Normal appearance. He is well-developed. He is not toxic-appearing.  HENT:     Head: Normocephalic and atraumatic.  Eyes:     General: Lids are normal.     Conjunctiva/sclera: Conjunctivae normal.     Pupils: Pupils are equal, round, and reactive to light.  Neck:     Musculoskeletal: Normal range of motion and neck supple.  Thyroid: No thyroid mass.     Trachea: No tracheal deviation.  Cardiovascular:     Rate and Rhythm: Normal rate and regular rhythm.     Heart sounds: Normal heart sounds. No murmur. No gallop.   Pulmonary:     Effort: Pulmonary effort is normal. No respiratory distress.     Breath sounds: Normal breath sounds. No stridor. No decreased breath sounds, wheezing, rhonchi or rales.  Abdominal:     General: Bowel sounds are normal. There is no distension.     Palpations: Abdomen is soft.     Tenderness: There is no abdominal tenderness. There is no rebound.  Musculoskeletal: Normal range of motion.        General: No tenderness.       Arms:  Skin:    General: Skin is warm and dry.     Findings: No abrasion or rash.  Neurological:     Mental Status: He is alert and oriented to person, place, and time.     GCS: GCS eye subscore is 4. GCS verbal subscore is 5. GCS motor subscore is 6.     Cranial Nerves: No cranial nerve deficit.     Sensory: No sensory deficit.  Psychiatric:        Speech: Speech normal.        Behavior: Behavior normal.      ED Treatments / Results  Labs (all labs ordered are listed, but only abnormal results are displayed) Labs Reviewed  COMPREHENSIVE METABOLIC PANEL - Abnormal; Notable for the following components:      Result Value   Chloride 91 (*)    Glucose, Bld 140 (*)    BUN 41 (*)    Creatinine, Ser 6.57 (*)    Albumin 3.3 (*)    GFR calc non Af Amer 9 (*)    GFR calc Af Amer  10 (*)    Anion gap 16 (*)    All other components within normal limits  CBC WITH DIFFERENTIAL/PLATELET - Abnormal; Notable for the following components:   RBC 3.83 (*)    Hemoglobin 12.1 (*)    HCT 38.5 (*)    MCV 100.5 (*)    RDW 18.6 (*)    Eosinophils Absolute 0.6 (*)    All other components within normal limits  CULTURE, BLOOD (ROUTINE X 2)  CULTURE, BLOOD (ROUTINE X 2)  LACTIC ACID, PLASMA  PROTIME-INR  LACTIC ACID, PLASMA    EKG None  Radiology No results found.  Procedures Procedures (including critical care time)  Medications Ordered in ED Medications - No data to display   Initial Impression / Assessment and Plan / ED Course  I have reviewed the triage vital signs and the nursing notes.  Pertinent labs & imaging results that were available during my care of the patient were reviewed by me and considered in my medical decision making (see chart for details).        Patient received a dose of IV clindamycin here.  Patient seen by Dr. Doren Custard from vascular surgery who is arranged outpatient treatment  Final Clinical Impressions(s) / ED Diagnoses   Final diagnoses:  None    ED Discharge Orders    None       Lacretia Leigh, MD 04/26/19 1038

## 2019-04-28 ENCOUNTER — Telehealth: Payer: Self-pay | Admitting: *Deleted

## 2019-04-28 ENCOUNTER — Ambulatory Visit (INDEPENDENT_AMBULATORY_CARE_PROVIDER_SITE_OTHER): Payer: Self-pay | Admitting: Physician Assistant

## 2019-04-28 ENCOUNTER — Other Ambulatory Visit: Payer: Self-pay

## 2019-04-28 VITALS — BP 151/84 | HR 78 | Temp 97.8°F | Resp 12 | Ht 75.0 in | Wt 224.0 lb

## 2019-04-28 DIAGNOSIS — S41102A Unspecified open wound of left upper arm, initial encounter: Secondary | ICD-10-CM | POA: Diagnosis not present

## 2019-04-28 DIAGNOSIS — Z992 Dependence on renal dialysis: Secondary | ICD-10-CM | POA: Diagnosis not present

## 2019-04-28 DIAGNOSIS — D509 Iron deficiency anemia, unspecified: Secondary | ICD-10-CM | POA: Diagnosis not present

## 2019-04-28 DIAGNOSIS — D631 Anemia in chronic kidney disease: Secondary | ICD-10-CM | POA: Diagnosis not present

## 2019-04-28 DIAGNOSIS — N2581 Secondary hyperparathyroidism of renal origin: Secondary | ICD-10-CM | POA: Diagnosis not present

## 2019-04-28 DIAGNOSIS — N186 End stage renal disease: Secondary | ICD-10-CM | POA: Diagnosis not present

## 2019-04-28 NOTE — Telephone Encounter (Signed)
-----   Message from Angelia Mould, MD sent at 04/26/2019 10:34 AM EDT ----- Regarding: charge I saw this patient in the ER this morning with some cellulitis overlying his fistula.  He is instructed to keep his follow-up appointment with Dr. Trula Slade on Monday.  He is receiving intravenous antibiotics at the time of dialysis on Monday Wednesdays and Fridays. CD

## 2019-04-29 DIAGNOSIS — N186 End stage renal disease: Secondary | ICD-10-CM | POA: Diagnosis not present

## 2019-04-29 DIAGNOSIS — Z992 Dependence on renal dialysis: Secondary | ICD-10-CM | POA: Diagnosis not present

## 2019-04-29 DIAGNOSIS — I120 Hypertensive chronic kidney disease with stage 5 chronic kidney disease or end stage renal disease: Secondary | ICD-10-CM | POA: Diagnosis not present

## 2019-04-30 DIAGNOSIS — E8779 Other fluid overload: Secondary | ICD-10-CM | POA: Diagnosis not present

## 2019-04-30 DIAGNOSIS — N186 End stage renal disease: Secondary | ICD-10-CM | POA: Diagnosis not present

## 2019-04-30 DIAGNOSIS — Z992 Dependence on renal dialysis: Secondary | ICD-10-CM | POA: Diagnosis not present

## 2019-04-30 DIAGNOSIS — E875 Hyperkalemia: Secondary | ICD-10-CM | POA: Diagnosis not present

## 2019-04-30 DIAGNOSIS — S41102A Unspecified open wound of left upper arm, initial encounter: Secondary | ICD-10-CM | POA: Diagnosis not present

## 2019-04-30 DIAGNOSIS — N2581 Secondary hyperparathyroidism of renal origin: Secondary | ICD-10-CM | POA: Diagnosis not present

## 2019-04-30 DIAGNOSIS — D631 Anemia in chronic kidney disease: Secondary | ICD-10-CM | POA: Diagnosis not present

## 2019-04-30 DIAGNOSIS — Z23 Encounter for immunization: Secondary | ICD-10-CM | POA: Diagnosis not present

## 2019-05-01 LAB — CULTURE, BLOOD (ROUTINE X 2)
Culture: NO GROWTH
Culture: NO GROWTH
Special Requests: ADEQUATE
Special Requests: ADEQUATE

## 2019-05-02 DIAGNOSIS — Z992 Dependence on renal dialysis: Secondary | ICD-10-CM | POA: Diagnosis not present

## 2019-05-02 DIAGNOSIS — N2581 Secondary hyperparathyroidism of renal origin: Secondary | ICD-10-CM | POA: Diagnosis not present

## 2019-05-02 DIAGNOSIS — N186 End stage renal disease: Secondary | ICD-10-CM | POA: Diagnosis not present

## 2019-05-02 DIAGNOSIS — D631 Anemia in chronic kidney disease: Secondary | ICD-10-CM | POA: Diagnosis not present

## 2019-05-02 DIAGNOSIS — S41102A Unspecified open wound of left upper arm, initial encounter: Secondary | ICD-10-CM | POA: Diagnosis not present

## 2019-05-02 DIAGNOSIS — Z23 Encounter for immunization: Secondary | ICD-10-CM | POA: Diagnosis not present

## 2019-05-05 DIAGNOSIS — Z23 Encounter for immunization: Secondary | ICD-10-CM | POA: Diagnosis not present

## 2019-05-05 DIAGNOSIS — D631 Anemia in chronic kidney disease: Secondary | ICD-10-CM | POA: Diagnosis not present

## 2019-05-05 DIAGNOSIS — N186 End stage renal disease: Secondary | ICD-10-CM | POA: Diagnosis not present

## 2019-05-05 DIAGNOSIS — Z992 Dependence on renal dialysis: Secondary | ICD-10-CM | POA: Diagnosis not present

## 2019-05-05 DIAGNOSIS — S41102A Unspecified open wound of left upper arm, initial encounter: Secondary | ICD-10-CM | POA: Diagnosis not present

## 2019-05-05 DIAGNOSIS — N2581 Secondary hyperparathyroidism of renal origin: Secondary | ICD-10-CM | POA: Diagnosis not present

## 2019-05-07 DIAGNOSIS — Z23 Encounter for immunization: Secondary | ICD-10-CM | POA: Diagnosis not present

## 2019-05-07 DIAGNOSIS — D631 Anemia in chronic kidney disease: Secondary | ICD-10-CM | POA: Diagnosis not present

## 2019-05-07 DIAGNOSIS — N2581 Secondary hyperparathyroidism of renal origin: Secondary | ICD-10-CM | POA: Diagnosis not present

## 2019-05-07 DIAGNOSIS — S41102A Unspecified open wound of left upper arm, initial encounter: Secondary | ICD-10-CM | POA: Diagnosis not present

## 2019-05-07 DIAGNOSIS — N186 End stage renal disease: Secondary | ICD-10-CM | POA: Diagnosis not present

## 2019-05-07 DIAGNOSIS — Z992 Dependence on renal dialysis: Secondary | ICD-10-CM | POA: Diagnosis not present

## 2019-05-09 ENCOUNTER — Telehealth (HOSPITAL_COMMUNITY): Payer: Self-pay | Admitting: Rehabilitation

## 2019-05-09 DIAGNOSIS — Z23 Encounter for immunization: Secondary | ICD-10-CM | POA: Diagnosis not present

## 2019-05-09 DIAGNOSIS — N2581 Secondary hyperparathyroidism of renal origin: Secondary | ICD-10-CM | POA: Diagnosis not present

## 2019-05-09 DIAGNOSIS — S41102A Unspecified open wound of left upper arm, initial encounter: Secondary | ICD-10-CM | POA: Diagnosis not present

## 2019-05-09 DIAGNOSIS — N186 End stage renal disease: Secondary | ICD-10-CM | POA: Diagnosis not present

## 2019-05-09 DIAGNOSIS — D631 Anemia in chronic kidney disease: Secondary | ICD-10-CM | POA: Diagnosis not present

## 2019-05-09 DIAGNOSIS — Z992 Dependence on renal dialysis: Secondary | ICD-10-CM | POA: Diagnosis not present

## 2019-05-09 NOTE — Telephone Encounter (Signed)

## 2019-05-11 NOTE — Progress Notes (Deleted)
POST OPERATIVE OFFICE NOTE    CC:  F/u for surgery  HPI:  This is a 53 y.o. male who is s/p plication of left arm fistula aneurysm x 2 on 04/08/2019 by Dr. Trula Slade.  He returns today for follow up.    He states that he started getting some redness and drainage from the left arm surgical site last week.  He states he went to the ER on Saturday.  He was seen by Dr. Scot Dock and it was recommended to continue his IV abx at HD and keep his appt today.    He states that after he had a gush of some bloody fluid out of the wound, his arm started to feel better.  He states he has gotten some liquid abx and IV abx at HD.  He states that the top of the incision is draining clear liquid.  He has not had any fevers.    He did not have any evidence of steal sx.  He was seen by me and Dr. Trula Slade at the last visit.  He was kept on IV vanc for another 2 weeks and returns today for follow up.   Allergies  Allergen Reactions  . Lisinopril Shortness Of Breath    Unable to breath    Current Outpatient Medications  Medication Sig Dispense Refill  . amLODipine (NORVASC) 10 MG tablet Take 10 mg by mouth at bedtime.     Marland Kitchen aspirin EC 81 MG tablet Take 81 mg by mouth daily.    Marland Kitchen atorvastatin (LIPITOR) 80 MG tablet Take 80 mg by mouth daily.     . carvedilol (COREG) 3.125 MG tablet Take 3.125 mg by mouth 2 (two) times daily with a meal.     . cloNIDine (CATAPRES) 0.1 MG tablet Take 0.2 mg by mouth daily.    . cyclobenzaprine (FLEXERIL) 10 MG tablet Take 20 mg by mouth 2 (two) times daily as needed for muscle spasms.     . DULoxetine (CYMBALTA) 60 MG capsule Take 60 mg by mouth every morning.   2  . FOSRENOL 1000 MG chewable tablet Chew 6,000 mg by mouth 3 (three) times daily with meals.   11  . gabapentin (NEURONTIN) 100 MG capsule Take 3 capsules (300 mg total) by mouth at bedtime. 60 capsule 0  . HYDROcodone-acetaminophen (NORCO) 5-325 MG tablet Take 1 tablet by mouth every 6 (six) hours as needed for  moderate pain. 15 tablet 0  . hydrOXYzine (ATARAX/VISTARIL) 25 MG tablet Take 25 mg by mouth 2 (two) times daily as needed for itching (Sleep).     . multivitamin (RENA-VIT) TABS tablet Take 1 tablet by mouth every evening.    . naproxen sodium (ALEVE) 220 MG tablet Take 220 mg by mouth daily.     . nortriptyline (PAMELOR) 25 MG capsule Take 25 mg by mouth every morning.   2  . pantoprazole (PROTONIX) 40 MG tablet Take 40 mg by mouth daily.     . VENTOLIN HFA 108 (90 Base) MCG/ACT inhaler Inhale 2 puffs into the lungs every 6 (six) hours as needed for wheezing or shortness of breath.      No current facility-administered medications for this visit.      ROS:  See HPI  Physical Exam:  ***  Incision:  *** Extremities:  *** Neuro: *** Abdomen:  ***  Assessment/Plan:  This is a 52 y.o. male who is s/p: plication of left arm fistula aneurysm x 2 on 04/08/2019 by Dr. Trula Slade and  returns today for follow up after receiving IV vanc for another couple of weeks.  -***   Leontine Locket, PA-C [facility] MD:  Trula Slade

## 2019-05-12 ENCOUNTER — Ambulatory Visit: Payer: Medicare Other

## 2019-05-12 DIAGNOSIS — D631 Anemia in chronic kidney disease: Secondary | ICD-10-CM | POA: Diagnosis not present

## 2019-05-12 DIAGNOSIS — S41102A Unspecified open wound of left upper arm, initial encounter: Secondary | ICD-10-CM | POA: Diagnosis not present

## 2019-05-12 DIAGNOSIS — N2581 Secondary hyperparathyroidism of renal origin: Secondary | ICD-10-CM | POA: Diagnosis not present

## 2019-05-12 DIAGNOSIS — Z992 Dependence on renal dialysis: Secondary | ICD-10-CM | POA: Diagnosis not present

## 2019-05-12 DIAGNOSIS — N186 End stage renal disease: Secondary | ICD-10-CM | POA: Diagnosis not present

## 2019-05-12 DIAGNOSIS — Z23 Encounter for immunization: Secondary | ICD-10-CM | POA: Diagnosis not present

## 2019-05-14 DIAGNOSIS — Z23 Encounter for immunization: Secondary | ICD-10-CM | POA: Diagnosis not present

## 2019-05-14 DIAGNOSIS — N186 End stage renal disease: Secondary | ICD-10-CM | POA: Diagnosis not present

## 2019-05-14 DIAGNOSIS — N2581 Secondary hyperparathyroidism of renal origin: Secondary | ICD-10-CM | POA: Diagnosis not present

## 2019-05-14 DIAGNOSIS — Z992 Dependence on renal dialysis: Secondary | ICD-10-CM | POA: Diagnosis not present

## 2019-05-14 DIAGNOSIS — D631 Anemia in chronic kidney disease: Secondary | ICD-10-CM | POA: Diagnosis not present

## 2019-05-14 DIAGNOSIS — S41102A Unspecified open wound of left upper arm, initial encounter: Secondary | ICD-10-CM | POA: Diagnosis not present

## 2019-05-16 DIAGNOSIS — Z992 Dependence on renal dialysis: Secondary | ICD-10-CM | POA: Diagnosis not present

## 2019-05-16 DIAGNOSIS — S41102A Unspecified open wound of left upper arm, initial encounter: Secondary | ICD-10-CM | POA: Diagnosis not present

## 2019-05-16 DIAGNOSIS — N186 End stage renal disease: Secondary | ICD-10-CM | POA: Diagnosis not present

## 2019-05-16 DIAGNOSIS — Z23 Encounter for immunization: Secondary | ICD-10-CM | POA: Diagnosis not present

## 2019-05-16 DIAGNOSIS — D631 Anemia in chronic kidney disease: Secondary | ICD-10-CM | POA: Diagnosis not present

## 2019-05-16 DIAGNOSIS — N2581 Secondary hyperparathyroidism of renal origin: Secondary | ICD-10-CM | POA: Diagnosis not present

## 2019-05-19 DIAGNOSIS — Z23 Encounter for immunization: Secondary | ICD-10-CM | POA: Diagnosis not present

## 2019-05-19 DIAGNOSIS — N186 End stage renal disease: Secondary | ICD-10-CM | POA: Diagnosis not present

## 2019-05-19 DIAGNOSIS — N2581 Secondary hyperparathyroidism of renal origin: Secondary | ICD-10-CM | POA: Diagnosis not present

## 2019-05-19 DIAGNOSIS — Z992 Dependence on renal dialysis: Secondary | ICD-10-CM | POA: Diagnosis not present

## 2019-05-19 DIAGNOSIS — D631 Anemia in chronic kidney disease: Secondary | ICD-10-CM | POA: Diagnosis not present

## 2019-05-19 DIAGNOSIS — S41102A Unspecified open wound of left upper arm, initial encounter: Secondary | ICD-10-CM | POA: Diagnosis not present

## 2019-05-21 DIAGNOSIS — Z992 Dependence on renal dialysis: Secondary | ICD-10-CM | POA: Diagnosis not present

## 2019-05-21 DIAGNOSIS — D631 Anemia in chronic kidney disease: Secondary | ICD-10-CM | POA: Diagnosis not present

## 2019-05-21 DIAGNOSIS — S41102A Unspecified open wound of left upper arm, initial encounter: Secondary | ICD-10-CM | POA: Diagnosis not present

## 2019-05-21 DIAGNOSIS — N186 End stage renal disease: Secondary | ICD-10-CM | POA: Diagnosis not present

## 2019-05-21 DIAGNOSIS — N2581 Secondary hyperparathyroidism of renal origin: Secondary | ICD-10-CM | POA: Diagnosis not present

## 2019-05-21 DIAGNOSIS — Z23 Encounter for immunization: Secondary | ICD-10-CM | POA: Diagnosis not present

## 2019-05-22 DIAGNOSIS — S41102A Unspecified open wound of left upper arm, initial encounter: Secondary | ICD-10-CM | POA: Diagnosis not present

## 2019-05-22 DIAGNOSIS — N186 End stage renal disease: Secondary | ICD-10-CM | POA: Diagnosis not present

## 2019-05-22 DIAGNOSIS — D631 Anemia in chronic kidney disease: Secondary | ICD-10-CM | POA: Diagnosis not present

## 2019-05-22 DIAGNOSIS — Z992 Dependence on renal dialysis: Secondary | ICD-10-CM | POA: Diagnosis not present

## 2019-05-22 DIAGNOSIS — N2581 Secondary hyperparathyroidism of renal origin: Secondary | ICD-10-CM | POA: Diagnosis not present

## 2019-05-22 DIAGNOSIS — Z23 Encounter for immunization: Secondary | ICD-10-CM | POA: Diagnosis not present

## 2019-05-23 DIAGNOSIS — S41102A Unspecified open wound of left upper arm, initial encounter: Secondary | ICD-10-CM | POA: Diagnosis not present

## 2019-05-23 DIAGNOSIS — Z23 Encounter for immunization: Secondary | ICD-10-CM | POA: Diagnosis not present

## 2019-05-23 DIAGNOSIS — Z992 Dependence on renal dialysis: Secondary | ICD-10-CM | POA: Diagnosis not present

## 2019-05-23 DIAGNOSIS — D631 Anemia in chronic kidney disease: Secondary | ICD-10-CM | POA: Diagnosis not present

## 2019-05-23 DIAGNOSIS — N186 End stage renal disease: Secondary | ICD-10-CM | POA: Diagnosis not present

## 2019-05-23 DIAGNOSIS — N2581 Secondary hyperparathyroidism of renal origin: Secondary | ICD-10-CM | POA: Diagnosis not present

## 2019-05-26 ENCOUNTER — Other Ambulatory Visit: Payer: Self-pay

## 2019-05-26 ENCOUNTER — Observation Stay (HOSPITAL_COMMUNITY)
Admission: EM | Admit: 2019-05-26 | Discharge: 2019-05-27 | Disposition: A | Discharge status: Home / Self Care | Payer: Medicare Other | Attending: Internal Medicine | Admitting: Internal Medicine

## 2019-05-26 ENCOUNTER — Encounter (HOSPITAL_COMMUNITY): Payer: Self-pay | Admitting: *Deleted

## 2019-05-26 ENCOUNTER — Emergency Department (HOSPITAL_COMMUNITY): Payer: Medicare Other

## 2019-05-26 DIAGNOSIS — G8929 Other chronic pain: Secondary | ICD-10-CM | POA: Diagnosis not present

## 2019-05-26 DIAGNOSIS — Z79899 Other long term (current) drug therapy: Secondary | ICD-10-CM | POA: Diagnosis not present

## 2019-05-26 DIAGNOSIS — E875 Hyperkalemia: Principal | ICD-10-CM | POA: Diagnosis present

## 2019-05-26 DIAGNOSIS — I442 Atrioventricular block, complete: Secondary | ICD-10-CM | POA: Insufficient documentation

## 2019-05-26 DIAGNOSIS — D638 Anemia in other chronic diseases classified elsewhere: Secondary | ICD-10-CM | POA: Diagnosis not present

## 2019-05-26 DIAGNOSIS — R001 Bradycardia, unspecified: Secondary | ICD-10-CM | POA: Diagnosis not present

## 2019-05-26 DIAGNOSIS — E871 Hypo-osmolality and hyponatremia: Secondary | ICD-10-CM | POA: Diagnosis not present

## 2019-05-26 DIAGNOSIS — I213 ST elevation (STEMI) myocardial infarction of unspecified site: Secondary | ICD-10-CM | POA: Diagnosis not present

## 2019-05-26 DIAGNOSIS — Z20828 Contact with and (suspected) exposure to other viral communicable diseases: Secondary | ICD-10-CM | POA: Insufficient documentation

## 2019-05-26 DIAGNOSIS — I452 Bifascicular block: Secondary | ICD-10-CM | POA: Diagnosis not present

## 2019-05-26 DIAGNOSIS — M1990 Unspecified osteoarthritis, unspecified site: Secondary | ICD-10-CM | POA: Diagnosis not present

## 2019-05-26 DIAGNOSIS — I499 Cardiac arrhythmia, unspecified: Secondary | ICD-10-CM | POA: Diagnosis not present

## 2019-05-26 DIAGNOSIS — M545 Low back pain: Secondary | ICD-10-CM | POA: Insufficient documentation

## 2019-05-26 DIAGNOSIS — Z7982 Long term (current) use of aspirin: Secondary | ICD-10-CM | POA: Insufficient documentation

## 2019-05-26 DIAGNOSIS — E785 Hyperlipidemia, unspecified: Secondary | ICD-10-CM | POA: Insufficient documentation

## 2019-05-26 DIAGNOSIS — Z8249 Family history of ischemic heart disease and other diseases of the circulatory system: Secondary | ICD-10-CM | POA: Insufficient documentation

## 2019-05-26 DIAGNOSIS — R079 Chest pain, unspecified: Secondary | ICD-10-CM | POA: Diagnosis not present

## 2019-05-26 DIAGNOSIS — I2510 Atherosclerotic heart disease of native coronary artery without angina pectoris: Secondary | ICD-10-CM | POA: Diagnosis not present

## 2019-05-26 DIAGNOSIS — I1311 Hypertensive heart and chronic kidney disease without heart failure, with stage 5 chronic kidney disease, or end stage renal disease: Secondary | ICD-10-CM | POA: Insufficient documentation

## 2019-05-26 DIAGNOSIS — N186 End stage renal disease: Secondary | ICD-10-CM | POA: Insufficient documentation

## 2019-05-26 DIAGNOSIS — F17210 Nicotine dependence, cigarettes, uncomplicated: Secondary | ICD-10-CM | POA: Insufficient documentation

## 2019-05-26 DIAGNOSIS — Z992 Dependence on renal dialysis: Secondary | ICD-10-CM | POA: Diagnosis not present

## 2019-05-26 DIAGNOSIS — N2581 Secondary hyperparathyroidism of renal origin: Secondary | ICD-10-CM | POA: Diagnosis not present

## 2019-05-26 DIAGNOSIS — I10 Essential (primary) hypertension: Secondary | ICD-10-CM | POA: Diagnosis not present

## 2019-05-26 LAB — CBC WITH DIFFERENTIAL/PLATELET
Abs Immature Granulocytes: 0.08 10*3/uL — ABNORMAL HIGH (ref 0.00–0.07)
Basophils Absolute: 0 10*3/uL (ref 0.0–0.1)
Basophils Relative: 0 %
Eosinophils Absolute: 0.1 10*3/uL (ref 0.0–0.5)
Eosinophils Relative: 1 %
HCT: 35.7 % — ABNORMAL LOW (ref 39.0–52.0)
Hemoglobin: 11.2 g/dL — ABNORMAL LOW (ref 13.0–17.0)
Immature Granulocytes: 1 %
Lymphocytes Relative: 9 %
Lymphs Abs: 0.8 10*3/uL (ref 0.7–4.0)
MCH: 30.9 pg (ref 26.0–34.0)
MCHC: 31.4 g/dL (ref 30.0–36.0)
MCV: 98.3 fL (ref 80.0–100.0)
Monocytes Absolute: 0.8 10*3/uL (ref 0.1–1.0)
Monocytes Relative: 9 %
Neutro Abs: 7.2 10*3/uL (ref 1.7–7.7)
Neutrophils Relative %: 80 %
Platelets: 224 10*3/uL (ref 150–400)
RBC: 3.63 MIL/uL — ABNORMAL LOW (ref 4.22–5.81)
RDW: 18 % — ABNORMAL HIGH (ref 11.5–15.5)
WBC: 9 10*3/uL (ref 4.0–10.5)
nRBC: 0 % (ref 0.0–0.2)

## 2019-05-26 LAB — COMPREHENSIVE METABOLIC PANEL
ALT: 35 U/L (ref 0–44)
AST: 35 U/L (ref 15–41)
Albumin: 3.5 g/dL (ref 3.5–5.0)
Alkaline Phosphatase: 66 U/L (ref 38–126)
Anion gap: 19 — ABNORMAL HIGH (ref 5–15)
BUN: 86 mg/dL — ABNORMAL HIGH (ref 6–20)
CO2: 21 mmol/L — ABNORMAL LOW (ref 22–32)
Calcium: 9.8 mg/dL (ref 8.9–10.3)
Chloride: 87 mmol/L — ABNORMAL LOW (ref 98–111)
Creatinine, Ser: 10.2 mg/dL — ABNORMAL HIGH (ref 0.61–1.24)
GFR calc Af Amer: 6 mL/min — ABNORMAL LOW (ref >60)
GFR calc non Af Amer: 5 mL/min — ABNORMAL LOW (ref >60)
Glucose, Bld: 101 mg/dL — ABNORMAL HIGH (ref 70–99)
Potassium: >7.5 mmol/L (ref 3.5–5.1)
Sodium: 127 mmol/L — ABNORMAL LOW (ref 135–145)
Total Bilirubin: 1.4 mg/dL — ABNORMAL HIGH (ref 0.3–1.2)
Total Protein: 6.8 g/dL (ref 6.5–8.1)

## 2019-05-26 LAB — I-STAT CHEM 8, ED
BUN: 87 mg/dL — ABNORMAL HIGH (ref 6–20)
Calcium, Ion: 1.07 mmol/L — ABNORMAL LOW (ref 1.15–1.40)
Chloride: 90 mmol/L — ABNORMAL LOW (ref 98–111)
Creatinine, Ser: 10.6 mg/dL — ABNORMAL HIGH (ref 0.61–1.24)
Glucose, Bld: 92 mg/dL (ref 70–99)
HCT: 37 % — ABNORMAL LOW (ref 39.0–52.0)
Hemoglobin: 12.6 g/dL — ABNORMAL LOW (ref 13.0–17.0)
Potassium: 7.5 mmol/L (ref 3.5–5.1)
Sodium: 123 mmol/L — ABNORMAL LOW (ref 135–145)
TCO2: 22 mmol/L (ref 22–32)

## 2019-05-26 LAB — BASIC METABOLIC PANEL
Anion gap: 19 — ABNORMAL HIGH (ref 5–15)
BUN: 88 mg/dL — ABNORMAL HIGH (ref 6–20)
CO2: 22 mmol/L (ref 22–32)
Calcium: 10 mg/dL (ref 8.9–10.3)
Chloride: 87 mmol/L — ABNORMAL LOW (ref 98–111)
Creatinine, Ser: 10.39 mg/dL — ABNORMAL HIGH (ref 0.61–1.24)
GFR calc Af Amer: 6 mL/min — ABNORMAL LOW (ref >60)
GFR calc non Af Amer: 5 mL/min — ABNORMAL LOW (ref >60)
Glucose, Bld: 72 mg/dL (ref 70–99)
Potassium: 6.5 mmol/L (ref 3.5–5.1)
Sodium: 128 mmol/L — ABNORMAL LOW (ref 135–145)

## 2019-05-26 LAB — TROPONIN I (HIGH SENSITIVITY)
Troponin I (High Sensitivity): 151 ng/L (ref <18)
Troponin I (High Sensitivity): 167 ng/L (ref <18)

## 2019-05-26 LAB — SARS CORONAVIRUS 2 BY RT PCR (HOSPITAL ORDER, PERFORMED IN ~~LOC~~ HOSPITAL LAB): SARS Coronavirus 2: NEGATIVE

## 2019-05-26 LAB — POTASSIUM
Potassium: 4.8 mmol/L (ref 3.5–5.1)
Potassium: 5 mmol/L (ref 3.5–5.1)
Potassium: 5 mmol/L (ref 3.5–5.1)

## 2019-05-26 LAB — PHOSPHORUS: Phosphorus: 11.7 mg/dL — ABNORMAL HIGH (ref 2.5–4.6)

## 2019-05-26 LAB — MAGNESIUM: Magnesium: 2.5 mg/dL — ABNORMAL HIGH (ref 1.7–2.4)

## 2019-05-26 MED ORDER — SODIUM BICARBONATE 8.4 % IV SOLN
50.0000 meq | Freq: Once | INTRAVENOUS | Status: AC
  Administered 2019-05-26: 50 meq via INTRAVENOUS

## 2019-05-26 MED ORDER — ALBUTEROL SULFATE (2.5 MG/3ML) 0.083% IN NEBU: 3.0000 mL | INHALATION_SOLUTION | Freq: Four times a day (QID) | RESPIRATORY_TRACT | Status: DC | PRN

## 2019-05-26 MED ORDER — DEXTROSE 50 % IV SOLN
1.0000 | Freq: Once | INTRAVENOUS | Status: AC
  Administered 2019-05-26: 50 mL via INTRAVENOUS
  Filled 2019-05-26: qty 50

## 2019-05-26 MED ORDER — SODIUM CHLORIDE 0.9 % IV SOLN: 250.0000 mL | INTRAVENOUS | Status: DC | PRN

## 2019-05-26 MED ORDER — PANTOPRAZOLE SODIUM 40 MG PO TBEC
40.0000 mg | DELAYED_RELEASE_TABLET | Freq: Every day | ORAL | Status: DC
  Administered 2019-05-26 – 2019-05-27 (×2): 40 mg via ORAL
  Filled 2019-05-26 (×2): qty 1

## 2019-05-26 MED ORDER — HEPARIN SODIUM (PORCINE) 1000 UNIT/ML DIALYSIS
5000.0000 [IU] | INTRAMUSCULAR | Status: DC | PRN
  Administered 2019-05-26: 09:00:00 5000 [IU] via INTRAVENOUS_CENTRAL
  Filled 2019-05-26 (×2): qty 5

## 2019-05-26 MED ORDER — SODIUM CHLORIDE 0.9% FLUSH: 3.0000 mL | INTRAVENOUS | Status: DC | PRN

## 2019-05-26 MED ORDER — INSULIN ASPART 100 UNIT/ML IV SOLN
5.0000 [IU] | Freq: Once | INTRAVENOUS | Status: AC
  Administered 2019-05-26: 5 [IU] via INTRAVENOUS

## 2019-05-26 MED ORDER — DEXTROSE 10 % IV SOLN
Freq: Once | INTRAVENOUS | Status: AC
  Administered 2019-05-26: 05:00:00 via INTRAVENOUS

## 2019-05-26 MED ORDER — CALCIUM GLUCONATE 10 % IV SOLN
1.0000 g | Freq: Once | INTRAVENOUS | Status: AC
  Administered 2019-05-26: 1 g via INTRAVENOUS
  Filled 2019-05-26: qty 10

## 2019-05-26 MED ORDER — SODIUM CHLORIDE 0.9% FLUSH
3.0000 mL | Freq: Two times a day (BID) | INTRAVENOUS | Status: DC
  Administered 2019-05-26 – 2019-05-27 (×2): 3 mL via INTRAVENOUS

## 2019-05-26 MED ORDER — HYDROCODONE-ACETAMINOPHEN 5-325 MG PO TABS
1.0000 | ORAL_TABLET | Freq: Four times a day (QID) | ORAL | Status: DC | PRN
  Administered 2019-05-26 – 2019-05-27 (×3): 1 via ORAL
  Filled 2019-05-26 (×3): qty 1

## 2019-05-26 MED ORDER — SODIUM ZIRCONIUM CYCLOSILICATE 10 G PO PACK
10.0000 g | PACK | Freq: Once | ORAL | Status: AC
  Administered 2019-05-26: 10 g via ORAL
  Filled 2019-05-26: qty 1

## 2019-05-26 MED ORDER — GABAPENTIN 300 MG PO CAPS
300.0000 mg | ORAL_CAPSULE | Freq: Every day | ORAL | Status: DC
  Administered 2019-05-26: 300 mg via ORAL
  Filled 2019-05-26: qty 1

## 2019-05-26 MED ORDER — LANTHANUM CARBONATE 500 MG PO CHEW
6000.0000 mg | CHEWABLE_TABLET | Freq: Three times a day (TID) | ORAL | Status: DC
  Administered 2019-05-26 – 2019-05-27 (×2): 6000 mg via ORAL
  Filled 2019-05-26 (×3): qty 12

## 2019-05-26 MED ORDER — SODIUM CHLORIDE 0.9% FLUSH
3.0000 mL | Freq: Two times a day (BID) | INTRAVENOUS | Status: DC
  Administered 2019-05-26: 3 mL via INTRAVENOUS

## 2019-05-26 MED ORDER — HEPARIN SODIUM (PORCINE) 1000 UNIT/ML IJ SOLN
INTRAMUSCULAR | Status: AC
  Filled 2019-05-26: qty 5

## 2019-05-26 MED ORDER — HEPARIN SODIUM (PORCINE) 5000 UNIT/ML IJ SOLN
5000.0000 [IU] | Freq: Three times a day (TID) | INTRAMUSCULAR | Status: DC
  Administered 2019-05-26 – 2019-05-27 (×3): 5000 [IU] via SUBCUTANEOUS
  Filled 2019-05-26 (×3): qty 1

## 2019-05-26 MED ORDER — ASPIRIN EC 81 MG PO TBEC
81.0000 mg | DELAYED_RELEASE_TABLET | Freq: Every day | ORAL | Status: DC
  Administered 2019-05-26 – 2019-05-27 (×2): 81 mg via ORAL
  Filled 2019-05-26 (×2): qty 1

## 2019-05-26 MED ORDER — NORTRIPTYLINE HCL 25 MG PO CAPS
25.0000 mg | ORAL_CAPSULE | Freq: Every day | ORAL | Status: DC
  Administered 2019-05-26 – 2019-05-27 (×2): 25 mg via ORAL
  Filled 2019-05-26 (×2): qty 1

## 2019-05-26 MED ORDER — ATORVASTATIN CALCIUM 80 MG PO TABS
80.0000 mg | ORAL_TABLET | Freq: Every day | ORAL | Status: DC
  Administered 2019-05-26: 80 mg via ORAL
  Filled 2019-05-26: qty 1

## 2019-05-26 MED ORDER — CHLORHEXIDINE GLUCONATE CLOTH 2 % EX PADS
6.0000 | MEDICATED_PAD | Freq: Every day | CUTANEOUS | Status: DC
  Administered 2019-05-27: 6 via TOPICAL

## 2019-05-26 NOTE — ED Provider Notes (Signed)
Emergency Department Provider Note   I have reviewed the triage vital signs and the nursing notes.   HISTORY  Chief Complaint No chief complaint on file.   HPI Marco Arnold is a 53 y.o. male who presents to ED as a code STEMI for near syncopal episode. Found to be bradycardic on EMS arrival. Paced. Brought here for further. Felt sob and nausea at the time. No chest pain. No other associated symptoms. At this time is a&o X 4 and asymptomatic.    No other associated or modifying symptoms.    Past Medical History:  Diagnosis Date  . Arthritis    spine  . Chronic kidney disease    [place] MWF  . Coronary artery disease   . Dialysis patient [facility]) 2013  . Diverticulitis   . Hyperlipidemia   . Hypertension    dr  Tobie Lords      @ [facility]  med  . Neuromuscular disorder ([place])    nerve pain - tx w/cymbalta  . Neuropathic pain 09/20/2017   Left mid-thoracic    Patient Active Problem List   Diagnosis Date Noted  . Neuropathic pain 09/20/2017  . Thoracic disc herniation 03/29/2017  . Chronic bilateral low back pain without sciatica 03/29/2017  . Other sleep disturbances 12/05/2012  . End stage renal disease ([place]) 12/05/2011  . ESRD (end stage renal disease) ([place]) 11/11/2011  . Anemia of chronic disease 11/11/2011  . Hyperparathyroidism, secondary renal ([place]) 11/11/2011  . HTN (hypertension) 11/11/2011  . Acute kidney injury ([place]) 11/06/2011    Past Surgical History:  Procedure Laterality Date  . AV FISTULA PLACEMENT  11/10/2011   Procedure: ARTERIOVENOUS (AV) FISTULA CREATION;  Surgeon: Mal Misty, MD;  Location: [place];  Service: Vascular;  Laterality: Left;  Ultrasound guided  . CARDIAC CATHETERIZATION  09/19/2018   On stent study at [facility]  . COLONOSCOPY    . INSERTION OF DIALYSIS CATHETER  02/09/2012   Procedure: INSERTION OF DIALYSIS CATHETER;  Surgeon: Mal Misty, MD;  Location: [place];  Service: Vascular;  Laterality: N/A;  . MASS EXCISION Right  09/07/2018   Procedure: EXCISION MASS RIGHT THUMB;  Surgeon: Charlotte Crumb, MD;  Location: [place];  Service: Orthopedics;  Laterality: Right;  OR AXILLARY BLOCK  . REVISON OF ARTERIOVENOUS FISTULA Left 04/08/2019   Procedure: REVISION PLICATION OF LEFT ARM ARTERIOVENOUS FISTULA;  Surgeon: Serafina Mitchell, MD;  Location: [place];  Service: Vascular;  Laterality: Left;  . SHUNTOGRAM Left 06/03/2012   Procedure: SHUNTOGRAM;  Surgeon: Conrad Guayanilla, MD;  Location: [facility] CATH LAB;  Service: Cardiovascular;  Laterality: Left;  . SHUNTOGRAM Left 01/02/2013   Procedure: SHUNTOGRAM;  Surgeon: Conrad [place]ro[place], MD;  Location: [facility] CATH LAB;  Service: Cardiovascular;  Laterality: Left;    Current Outpatient Rx  . Order #: [id number] Class: Historical Med  . Order #: [id number] Class: Historical Med  . Order #: [id number] Class: Historical Med  . Order #: [id number] Class: Historical Med  . Order #: [id number] Class: Historical Med  . Order #: [id number] Class: Historical Med  . Order #: [id number] Class: Historical Med  . Order #: [id number] Class: Historical Med  . Order #: [id number] Class: No Print  . Order #: [id number] Class: Normal  . Order #: [id number] Class: Historical Med  . Order #: [id number] Class: Historical Med  . Order #: [id number] Class: Historical Med  . Order #: [id number] Class: Historical Med  . Order #: [id number] Class: Historical Med  . Order #: [id number] Class: Historical Med    Allergies Lisinopril  Family  History  Problem Relation Age of Onset  . Pancreatic cancer Mother   . Diabetes Father   . Hypertension Father   . Cancer - Lung Paternal Grandfather     Social History Social History   Tobacco Use  . Smoking status: Light Tobacco Smoker    Packs/day: 0.25    Years: 43.00    Pack years: 10.75    Types: Cigarettes  . Smokeless tobacco: Never Used  . Tobacco comment: pt states that he is trying to quit--is trying to cut back at the current time  Substance Use Topics  . Alcohol use: Yes     Comment: occasional wine  . Drug use: Yes    Types: Marijuana    Comment: Last dose Friday 04/04/19    Review of Systems  All other systems negative except as documented in the HPI. All pertinent positives and negatives as reviewed in the HPI. ____________________________________________   PHYSICAL EXAM:  VITAL SIGNS: Vitals:   05/26/19 0341  Weight: 104.3 kg  Height: 6\' 3"  (1.905 m)     Constitutional: Alert and oriented. Well appearing and in no acute distress. Eyes: Conjunctivae are normal. PERRL. EOMI. Head: Atraumatic. Nose: No congestion/rhinnorhea. Mouth/Throat: Mucous membranes are moist.  Oropharynx non-erythematous. Neck: No stridor.  No meningeal signs.   Cardiovascular: Normal rate, regular rhythm. Good peripheral circulation. Grossly normal heart sounds.   Respiratory: Normal respiratory effort.  No retractions. Lungs CTAB. Gastrointestinal: Soft and nontender. No distention.  Musculoskeletal: No lower extremity tenderness nor edema. No gross deformities of extremities. Neurologic:  Normal speech and language. No gross focal neurologic deficits are appreciated.  Skin:  Skin is warm, dry and intact. No rash noted. Ecchymosis around left arm fistula.  ____________________________________________   LABS (all labs ordered are listed, but only abnormal results are displayed)  Labs Reviewed  CBC WITH DIFFERENTIAL/PLATELET - Abnormal; Notable for the following components:      Result Value   RBC 3.63 (*)    Hemoglobin 11.2 (*)    HCT 35.7 (*)    RDW 18.0 (*)    Abs Immature Granulocytes 0.08 (*)    All other components within normal limits  I-STAT CHEM 8, ED - Abnormal; Notable for the following components:   Sodium 123 (*)    Potassium 7.5 (*)    Chloride 90 (*)    BUN 87 (*)    Creatinine, Ser 10.60 (*)    Calcium, Ion 1.07 (*)    Hemoglobin 12.6 (*)    HCT 37.0 (*)    All other components within normal limits  SARS CORONAVIRUS 2 (HOSPITAL ORDER,  [place] [facility])  COMPREHENSIVE METABOLIC PANEL  MAGNESIUM  PHOSPHORUS  TROPONIN I (HIGH SENSITIVITY)   ____________________________________________  EKG   EKG Interpretation  Date/Time:  Monday May 26 2019 03:36:31 EDT Ventricular Rate:  74 PR Interval:    QRS Duration: 231 QT Interval:  497 QTC Calculation: 552 R Axis:   -71 Text Interpretation:  Sinus or ectopic atrial rhythm Prolonged PR interval Left atrial enlargement Right bundle branch block LVH with IVCD and secondary repol abnrm Prolonged QT interval new t wave changes similar qt worsened QRS widening Otherwise no significant change Confirmed by Merrily Pew [phone]) on 05/26/2019 3:49:34 AM       EKG Interpretation  Date/Time:  Monday May 26 2019 04:11:29 EDT Ventricular Rate:  76 PR Interval:    QRS Duration: 207 QT Interval:  592 QTC Calculation: 513 R Axis:   -  70 Text Interpretation:  likely third degree heart block with ventricular rate near 30 IVCD, consider atypical RBBB LVH with IVCD and secondary repol abnrm Prolonged QT interval Confirmed by Merrily Pew [phone]) on 05/26/2019 4:25:12 AM        ____________________________________________  RADIOLOGY  Dg Chest Portable 1 View  Result Date: 05/26/2019 CLINICAL DATA:  Chest pain EXAM: PORTABLE CHEST 1 VIEW COMPARISON:  08/15/2016 FINDINGS: Cardiomegaly. Normal aortic contours. Artifact from EKG leads and defibrillator pads. There is no edema, consolidation, effusion, or pneumothorax. IMPRESSION: Cardiomegaly without failure. Electronically Signed   By: Monte Fantasia M.D.   On: 05/26/2019 04:44    ____________________________________________   PROCEDURES  Procedure(s) performed:   .Critical Care Performed by: Merrily Pew, MD Authorized by: Merrily Pew, MD   Critical care provider statement:    Critical care time (minutes):  45   Critical care was necessary to treat or prevent imminent or life-threatening  deterioration of the following conditions:  Cardiac failure and endocrine crisis   Critical care was time spent personally by me on the following activities:  Discussions with consultants, evaluation of patient's response to treatment, examination of patient, ordering and performing treatments and interventions, ordering and review of laboratory studies, ordering and review of radiographic studies, pulse oximetry, re-evaluation of patient's condition, obtaining history from patient or surrogate and review of old charts     ____________________________________________   INITIAL IMPRESSION / [place] / ED COURSE  Patient arrived in stable condition with left bundle branch block.  No evidence of concerning sgarBosa criteria.  However his ECG did appear to have a wider QRS and longer QT and PR and previous EKGs.  This concerning for possible hyperkalemia (especially with his history of dialysis) as the cause for his bradycardia.  All the hyperkalemia treatments were ordered however before they could be administered patient an acute change in his appearance and became nauseous and near syncopal and went into a complete heart block with a ventricular rate in the 20s.  Code cart was used and medications administered by myself included: 10 mg of calcium chloride, 50 mill equivalent amp of bicarb, 1 mg of atropine.  This resulted in return to his left bundle branch block and alleviation of his symptoms.  Patient's i-STAT Chem-8 came back at 7.5 or greater for his potassium.  He received more of the hyperkalemia treatments at that time.  Discussed with Dr. Jonnie Finner for emergent dialysis and Dr. Myna Hidalgo for admission. At time of transfer to dialysis, HR in 80's. Normal BP. Airway protected.   Pertinent labs & imaging results that were available during my care of the patient were reviewed by me and considered in my medical decision making (see chart for details).    ____________________________________________  FINAL CLINICAL IMPRESSION(S) / ED DIAGNOSES  Final diagnoses:  Hyperkalemia     MEDICATIONS GIVEN DURING THIS VISIT:  Medications  calcium gluconate inj 10% (1 g) URGENT USE ONLY! (has no administration in time range)  insulin aspart (novoLOG) injection 5 Units (has no administration in time range)    And  dextrose 50 % solution 50 mL (has no administration in time range)  dextrose 10 % infusion (has no administration in time range)  sodium bicarbonate injection 50 mEq (has no administration in time range)  sodium zirconium cyclosilicate (LOKELMA) packet 10 g (has no administration in time range)     NEW OUTPATIENT MEDICATIONS STARTED DURING THIS VISIT:  New Prescriptions   No medications on file  Note:  This note was prepared with assistance of Dragon voice recognition software. Occasional wrong-word or sound-a-like substitutions may have occurred due to the inherent limitations of voice recognition software.   Ieesha Abbasi, Corene Cornea, MD 05/27/19 [phone]

## 2019-05-26 NOTE — Progress Notes (Signed)
Patient is a 53 year old Caucasian male with past medical history significant for coronary artery disease status post PCI, ESRD on hemodialysis for the last 7 years, hypertension, hyperlipidemia and chronic pain.  Patient presented with lightheadedness and near syncopal episode, with heart rate of 20 bpm and complete heart block.  Potassium was 7.5 on presentation.  Patient has undergone hemodialysis.  Heart block has resolved.  Cardiology input is appreciated.  Further management will depend on hospital course.

## 2019-05-26 NOTE — ED Notes (Signed)
Dr Jonnie Finner at bedside   Will dialyzed him in approx one hour

## 2019-05-26 NOTE — H&P (Signed)
History and Physical    Marco Arnold STM:[id number] DOB: 05/18/1966 DOA: 05/26/2019  PCP: Cyndi Bender, PA-C   Patient coming from: Home   Chief Complaint: Lightheaded, SOB, syncope   HPI: Marco Arnold is a 53 y.o. male with medical history significant for coronary artery disease with stents, ESRD on hemodialysis, hypertension, hyperlipidemia, and chronic pain, who presented to the emergency department for evaluation of lightheadedness, shortness of breath, and syncope.  Patient reports that he went to bed last night in his usual state of health, but woke early this morning feeling short of breath.  He went to stand up, was acutely lightheaded, and had a syncopal episode.  EMS arrived to find the patient in complete heart block with a heart rate of 20.  Patient was paced with EMS.  He denies any chest pain, denies fevers, denies cough, reports that he completed his dialysis session on 05/23/2019 without incident.   ED Course: Upon arrival to the ED, patient is found to be saturating well on nasal cannula, hypertensive to 175/100, and in complete heart block with bradycardia.  Chest x-ray is notable for cardiomegaly without acute heart failure.  Chemistry panel is concerning for potassium of 7.5.  CBC with mild normocytic anemia.  Patient was treated with calcium, dextrose with insulin, Lokelma, and bicarbonate in the ED with resolution of complete heart block.  Cardiology has evaluated the patient in the emergency department and nephrology was also consulted by the ED physician and is arranging for urgent dialysis. COVID-19 testing is negative.   Review of Systems:  All other systems reviewed and apart from HPI, are negative.  Past Medical History:  Diagnosis Date  . Arthritis    spine  . Chronic kidney disease    [place] MWF  . Coronary artery disease   . Dialysis patient [facility]) 2013  . Diverticulitis   . Hyperlipidemia   . Hypertension    dr  Tobie Lords      @ [facility]  med  .  Neuromuscular disorder ([place])    nerve pain - tx w/cymbalta  . Neuropathic pain 09/20/2017   Left mid-thoracic    Past Surgical History:  Procedure Laterality Date  . AV FISTULA PLACEMENT  11/10/2011   Procedure: ARTERIOVENOUS (AV) FISTULA CREATION;  Surgeon: Mal Misty, MD;  Location: [place];  Service: Vascular;  Laterality: Left;  Ultrasound guided  . CARDIAC CATHETERIZATION  09/19/2018   On stent study at [facility]  . COLONOSCOPY    . INSERTION OF DIALYSIS CATHETER  02/09/2012   Procedure: INSERTION OF DIALYSIS CATHETER;  Surgeon: Mal Misty, MD;  Location: [place];  Service: Vascular;  Laterality: N/A;  . MASS EXCISION Right 09/07/2018   Procedure: EXCISION MASS RIGHT THUMB;  Surgeon: Charlotte Crumb, MD;  Location: [place];  Service: Orthopedics;  Laterality: Right;  OR AXILLARY BLOCK  . REVISON OF ARTERIOVENOUS FISTULA Left 04/08/2019   Procedure: REVISION PLICATION OF LEFT ARM ARTERIOVENOUS FISTULA;  Surgeon: Serafina Mitchell, MD;  Location: [place];  Service: Vascular;  Laterality: Left;  . SHUNTOGRAM Left 06/03/2012   Procedure: SHUNTOGRAM;  Surgeon: Conrad Aquasco, MD;  Location: [facility] CATH LAB;  Service: Cardiovascular;  Laterality: Left;  . SHUNTOGRAM Left 01/02/2013   Procedure: SHUNTOGRAM;  Surgeon: Conrad Montgomery, MD;  Location: [facility] CATH LAB;  Service: Cardiovascular;  Laterality: Left;     reports that he has been smoking cigarettes. He has a 10.75 pack-year smoking history. He has never used smokeless tobacco.  He reports current alcohol use. He reports current drug use. Drug: Marijuana.  Allergies  Allergen Reactions  . Lisinopril Shortness Of Breath    Unable to breath    Family History  Problem Relation Age of Onset  . Pancreatic cancer Mother   . Diabetes Father   . Hypertension Father   . Cancer - Lung Paternal Grandfather      Prior to Admission medications   Medication Sig Start Date End Date Taking? Authorizing Provider  amLODipine (NORVASC) 10 MG tablet Take 10 mg by  mouth at bedtime.    Yes [provider]  aspirin EC 81 MG tablet Take 81 mg by mouth daily.   Yes [provider]  atorvastatin (LIPITOR) 80 MG tablet Take 80 mg by mouth daily.  01/16/19  Yes [provider]  carvedilol (COREG) 3.125 MG tablet Take 3.125 mg by mouth 2 (two) times daily with a meal.  01/16/19  Yes [provider]  cloNIDine (CATAPRES) 0.1 MG tablet Take 0.2 mg by mouth daily. 07/13/16  Yes [provider]  cyclobenzaprine (FLEXERIL) 10 MG tablet Take 20 mg by mouth 2 (two) times daily as needed for muscle spasms.    Yes [provider]  DULoxetine (CYMBALTA) 60 MG capsule Take 60 mg by mouth every morning.  01/29/18  Yes [provider]  FOSRENOL 1000 MG chewable tablet Chew 6,000 mg by mouth 3 (three) times daily with meals.  08/31/17  Yes [provider]  HYDROcodone-acetaminophen (NORCO) 5-325 MG tablet Take 1 tablet by mouth every 6 (six) hours as needed for moderate pain. 04/08/19  Yes Dagoberto Ligas, PA-C  hydrOXYzine (ATARAX/VISTARIL) 25 MG tablet Take 25 mg by mouth 2 (two) times daily as needed for itching (Sleep).    Yes [provider]  multivitamin (RENA-VIT) TABS tablet Take 1 tablet by mouth See admin instructions. Every Monday, Wednesday and Friday   Yes [provider]  naproxen sodium (ALEVE) 220 MG tablet Take 220 mg by mouth 2 (two) times daily as needed (pain).    Yes [provider]  nortriptyline (PAMELOR) 25 MG capsule Take 25 mg by mouth every morning.  01/29/18  Yes [provider]  pantoprazole (PROTONIX) 40 MG tablet Take 40 mg by mouth daily.  12/04/12  Yes [provider]  VENTOLIN HFA 108 (90 Base) MCG/ACT inhaler Inhale 2 puffs into the lungs every 6 (six) hours as needed for wheezing or shortness of breath.  12/21/17  Yes [provider]  gabapentin (NEURONTIN) 100 MG capsule Take 3 capsules (300 mg total) by mouth at bedtime. Patient  not taking: Reported on 05/26/2019 09/20/17   Kathrynn Ducking, MD    Physical Exam: Vitals:   05/26/19 0415 05/26/19 0445 05/26/19 0515 05/26/19 0545  BP: (!) 156/94 (!) 169/90 (!) 175/103 (!) 168/81  Pulse: 82 78 83 84  Resp: 18 (!) 22 16 15   SpO2: 100% 100% 100% 100%  Weight:      Height:        Constitutional: Alert, no pallor, diaphoretic  Eyes: PERTLA, lids and conjunctivae normal ENMT: Mucous membranes are moist. Posterior pharynx clear of any exudate or lesions.   Neck: normal, supple, no masses, no thyromegaly Respiratory: no wheezing, no crackles. Normal respiratory effort. No accessory muscle use.  Cardiovascular: S1 & S2 heard, regular rate and rhythm. No extremity edema.   Abdomen: No distension, no tenderness, soft. Bowel sounds normal.  Musculoskeletal: no clubbing / cyanosis. No joint deformity upper  and lower extremities. Normal muscle tone.  Skin: no significant rashes, lesions, ulcers. Diaphoretic.  Neurologic: CN 2-12 grossly intact. Sensation intact. Strength 5/5 in all 4 limbs.  Psychiatric: Alert and oriented x 3. Calm, cooperative.    Labs on Admission: I have personally reviewed following labs and imaging studies  CBC: Recent Labs  Lab 05/26/19 0347 05/26/19 0348  WBC 9.0  --   NEUTROABS 7.2  --   HGB 11.2* 12.6*  HCT 35.7* 37.0*  MCV 98.3  --   PLT 224  --    Basic Metabolic Panel: Recent Labs  Lab 05/26/19 0347 05/26/19 0348  NA 127* 123*  K >7.5* 7.5*  CL 87* 90*  CO2 21*  --   GLUCOSE 101* 92  BUN 86* 87*  CREATININE 10.20* 10.60*  CALCIUM 9.8  --   MG 2.5*  --   PHOS 11.7*  --    GFR: Estimated Creatinine Clearance: 10.5 mL/min (A) (by C-G formula based on SCr of 10.6 mg/dL (H)). Liver Function Tests: Recent Labs  Lab 05/26/19 0347  AST 35  ALT 35  ALKPHOS 66  BILITOT 1.4*  PROT 6.8  ALBUMIN 3.5   No results for input(s): LIPASE, AMYLASE in the last 168 hours. No results for input(s): AMMONIA in the last 168 hours.  Coagulation Profile: No results for input(s): INR, PROTIME in the last 168 hours. Cardiac Enzymes: No results for input(s): CKTOTAL, CKMB, CKMBINDEX, TROPONINI in the last 168 hours. BNP (last 3 results) No results for input(s): PROBNP in the last 8760 hours. HbA1C: No results for input(s): HGBA1C in the last 72 hours. CBG: No results for input(s): GLUCAP in the last 168 hours. Lipid Profile: No results for input(s): CHOL, HDL, LDLCALC, TRIG, CHOLHDL, LDLDIRECT in the last 72 hours. Thyroid Function Tests: No results for input(s): TSH, T4TOTAL, FREET4, T3FREE, THYROIDAB in the last 72 hours. Anemia Panel: No results for input(s): VITAMINB12, FOLATE, FERRITIN, TIBC, IRON, RETICCTPCT in the last 72 hours. Urine analysis:    Component Value Date/Time   COLORURINE YELLOW 11/07/2011 1535   APPEARANCEUR CLEAR 11/07/2011 1535   LABSPEC 1.011 11/07/2011 1535   PHURINE 5.5 11/07/2011 1535   GLUCOSEU 100 (A) 11/07/2011 1535   HGBUR LARGE (A) 11/07/2011 1535   BILIRUBINUR NEGATIVE 11/07/2011 1535   KETONESUR NEGATIVE 11/07/2011 1535   PROTEINUR 100 (A) 11/07/2011 1535   UROBILINOGEN 0.2 11/07/2011 1535   NITRITE NEGATIVE 11/07/2011 1535   LEUKOCYTESUR NEGATIVE 11/07/2011 1535   Sepsis Labs: @LABRCNTIP (procalcitonin:4,lacticidven:4) ) Recent Results (from the past 240 hour(s))  SARS Coronavirus 2 [facility] order, Performed in [place] hospital lab)     Status: None   Collection Time: 05/26/19  3:34 AM  Result Value Ref Range Status   SARS Coronavirus 2 NEGATIVE NEGATIVE Final    Comment: (NOTE) If result is NEGATIVE SARS-CoV-2 target nucleic acids are NOT DETECTED. The SARS-CoV-2 RNA is generally detectable in upper and lower  respiratory specimens during the acute phase of infection. The lowest  concentration of SARS-CoV-2 viral copies this assay can detect is 250  copies / mL. A negative result does not preclude SARS-CoV-2 infection  and should not be used as the sole basis  for treatment or other  patient management decisions.  A negative result may occur with  improper specimen collection / handling, submission of specimen other  than nasopharyngeal swab, presence of viral mutation(s) within the  areas targeted by this assay, and inadequate number of viral copies  (<250 copies / mL).  A negative result must be combined with clinical  observations, patient history, and epidemiological information. If result is POSITIVE SARS-CoV-2 target nucleic acids are DETECTED. The SARS-CoV-2 RNA is generally detectable in upper and lower  respiratory specimens dur ing the acute phase of infection.  Positive  results are indicative of active infection with SARS-CoV-2.  Clinical  correlation with patient history and other diagnostic information is  necessary to determine patient infection status.  Positive results do  not rule out bacterial infection or co-infection with other viruses. If result is PRESUMPTIVE POSTIVE SARS-CoV-2 nucleic acids MAY BE PRESENT.   A presumptive positive result was obtained on the submitted specimen  and confirmed on repeat testing.  While 2019 novel coronavirus  (SARS-CoV-2) nucleic acids may be present in the submitted sample  additional confirmatory testing may be necessary for epidemiological  and / or clinical management purposes  to differentiate between  SARS-CoV-2 and other Sarbecovirus currently known to infect humans.  If clinically indicated additional testing with an alternate test  methodology [phone]) is advised. The SARS-CoV-2 RNA is generally  detectable in upper and lower respiratory sp ecimens during the acute  phase of infection. The expected result is Negative. Fact Sheet for Patients:  StrictlyIdeas.no Fact Sheet for Healthcare Providers: BankingDealers.co.za This test is not yet approved or cleared by the [place] FDA and has been authorized for detection and/or  diagnosis of SARS-CoV-2 by FDA under an Emergency Use Authorization (EUA).  This EUA will remain in effect (meaning this test can be used) for the duration of the COVID-19 declaration under Section 564(b)(1) of the Act, 21 U.S.C. section 360bbb-3(b)(1), unless the authorization is terminated or revoked sooner. Performed at [place] [facility], [place] [address], [place], [place] [address]      Radiological Exams on Admission: Dg Chest Portable 1 View  Result Date: 05/26/2019 CLINICAL DATA:  Chest pain EXAM: PORTABLE CHEST 1 VIEW COMPARISON:  08/15/2016 FINDINGS: Cardiomegaly. Normal aortic contours. Artifact from EKG leads and defibrillator pads. There is no edema, consolidation, effusion, or pneumothorax. IMPRESSION: Cardiomegaly without failure. Electronically Signed   By: Monte Fantasia M.D.   On: 05/26/2019 04:44    EKG: Independently reviewed. Sinus rhythm, RBBB, LVH with IVCD, QTc 552 ms.   Assessment/Plan   1. Hyperkalemia; ESRD  - Presents with syncope, found to be in complete heart block with potassium of 7.5 despite reportedly completing HD without incident on 9/25  - He was treated in ED with IV caclium, insulin/dextrose, bicarbonate, and Lokelma  - Nephrology is consulting and much appreciated, planning for urgent HD  - Follow serial potassium levels, continue cardiac monitoring, pacer at bedside    2. Complete heart block; syncope  - Presents with lightheadedness, SOB, and syncope, found to be CHB  - Appreciate cardiology evaluation, likely secondary to severe hyperkalemia - Address hyperkalemia as above, continue cardiac monitoring, repeat EKG, keep pacer at bedside    3. CAD - No anginal complaints  - Continue ASA and statin   4. Hypertension  - Patient is going for HD; resume antihypertensives as tolerated after dialysis    5. Hyponatremia  - Serum sodium 127 on admission  - Repeat chemistries after HD    PPE: Mask, face shield  DVT prophylaxis: sq heparin   Code Status: Full  Family Communication: Discussed with patient  Consults called: Nephrology  Admission status: Observation     Vianne Bulls, MD [facility] Pager [phone]  If 7PM-7AM, please contact night-coverage [email or web address] Password  [email or web address]  05/26/2019, 6:00 AM

## 2019-05-26 NOTE — Progress Notes (Signed)
Patient seen and examined.  Agree with plan from Dr Charissa Bash.  Complete heart block likely secondary to hyperkalemia, appears to have resolved with treatment.  Would continue to monitor on telemetry and hold beta-blocker for now.

## 2019-05-26 NOTE — ED Notes (Signed)
Report given to dialysis nurse.

## 2019-05-26 NOTE — ED Notes (Signed)
The pt lost his qrs complexes and had only 10-12 p waves  Pt just prior report that he do not feel well and passed oout  Coming around minutes later docotrs at bedside paced him and gave the drugs listed initially

## 2019-05-26 NOTE — ED Notes (Signed)
The pt is c/o being hooked to all the wires attached to him  He wants it off complaining complaining complaining

## 2019-05-26 NOTE — Progress Notes (Signed)
Chaplain responded to page for Code Stemi.  The chaplain was not needed and the Code Stemi was canceled.  Brion Aliment Chaplain Resident For questions concerning this note please contact me by pager [phone]

## 2019-05-26 NOTE — Consult Note (Addendum)
Renal Service Consult Note [facility]  OZIEL BEITLER 05/26/2019 [place] Requesting Physician:  Dr Dayna Barker, J.   Reason for Consult:  ESRD pt w/ hyperkalemia HPI: The patient is a 53 y.o. year-old w/ hx of HTN, HL, CAD w/ stents and ESRD on HD MWF presenting to ED for c/o SOB and syncopal episode.  Pt was transcutaneously paced for complete heart block en route. In ED K+ returned 7.5 w/ bradycardia. Was given calcium IV w/ resolution of CHB, HR now in 80's.  BP's wnl.  Asked to see for ESRD.    Pt has not missed any HD.  He does not follow diet / fluid restrictions much per patient. No CP or SOB.  Has chronic back pain.  No fever, chills or sweats. COVID test is negative.     ROS  denies CP  no joint pain   no HA  no blurry vision  no rash  no diarrhea  no nausea/ vomiting   Past Medical History  Past Medical History:  Diagnosis Date  . Arthritis    spine  . Chronic kidney disease    [place] MWF  . Coronary artery disease   . Dialysis patient [facility]) 2013  . Diverticulitis   . Hyperlipidemia   . Hypertension    dr  Tobie Lords      @ [facility]  med  . Neuromuscular disorder ([place])    nerve pain - tx w/cymbalta  . Neuropathic pain 09/20/2017   Left mid-thoracic   Past Surgical History  Past Surgical History:  Procedure Laterality Date  . AV FISTULA PLACEMENT  11/10/2011   Procedure: ARTERIOVENOUS (AV) FISTULA CREATION;  Surgeon: Mal Misty, MD;  Location: [place];  Service: Vascular;  Laterality: Left;  Ultrasound guided  . CARDIAC CATHETERIZATION  09/19/2018   On stent study at [facility]  . COLONOSCOPY    . INSERTION OF DIALYSIS CATHETER  02/09/2012   Procedure: INSERTION OF DIALYSIS CATHETER;  Surgeon: Mal Misty, MD;  Location: [place];  Service: Vascular;  Laterality: N/A;  . MASS EXCISION Right 09/07/2018   Procedure: EXCISION MASS RIGHT THUMB;  Surgeon: Charlotte Crumb, MD;  Location: [place];  Service: Orthopedics;  Laterality: Right;  OR  AXILLARY BLOCK  . REVISON OF ARTERIOVENOUS FISTULA Left 04/08/2019   Procedure: REVISION PLICATION OF LEFT ARM ARTERIOVENOUS FISTULA;  Surgeon: Serafina Mitchell, MD;  Location: [place];  Service: Vascular;  Laterality: Left;  . SHUNTOGRAM Left 06/03/2012   Procedure: SHUNTOGRAM;  Surgeon: Conrad Kingston, MD;  Location: [facility] CATH LAB;  Service: Cardiovascular;  Laterality: Left;  . SHUNTOGRAM Left 01/02/2013   Procedure: SHUNTOGRAM;  Surgeon: Conrad Nina, MD;  Location: [facility] CATH LAB;  Service: Cardiovascular;  Laterality: Left;   Family History  Family History  Problem Relation Age of Onset  . Pancreatic cancer Mother   . Diabetes Father   . Hypertension Father   . Cancer - Lung Paternal Grandfather    Social History  reports that he has been smoking cigarettes. He has a 10.75 pack-year smoking history. He has never used smokeless tobacco. He reports current alcohol use. He reports current drug use. Drug: Marijuana. Allergies  Allergies  Allergen Reactions  . Lisinopril Shortness Of Breath    Unable to breath   Home medications Prior to Admission medications   Medication Sig Start Date End Date Taking? Authorizing Provider  amLODipine (NORVASC) 10 MG tablet Take 10 mg by mouth at bedtime.  [provider]  aspirin EC 81 MG tablet Take 81 mg by mouth daily.    [provider]  atorvastatin (LIPITOR) 80 MG tablet Take 80 mg by mouth daily.  01/16/19   [provider]  carvedilol (COREG) 3.125 MG tablet Take 3.125 mg by mouth 2 (two) times daily with a meal.  01/16/19   [provider]  cloNIDine (CATAPRES) 0.1 MG tablet Take 0.2 mg by mouth daily. 07/13/16   [provider]  cyclobenzaprine (FLEXERIL) 10 MG tablet Take 20 mg by mouth 2 (two) times daily as needed for muscle spasms.     [provider]  DULoxetine (CYMBALTA) 60 MG capsule Take 60 mg by mouth every morning.  01/29/18   [provider]  FOSRENOL 1000 MG chewable tablet  Chew 6,000 mg by mouth 3 (three) times daily with meals.  08/31/17   [provider]  gabapentin (NEURONTIN) 100 MG capsule Take 3 capsules (300 mg total) by mouth at bedtime. 09/20/17   Kathrynn Ducking, MD  HYDROcodone-acetaminophen (NORCO) 5-325 MG tablet Take 1 tablet by mouth every 6 (six) hours as needed for moderate pain. 04/08/19   Dagoberto Ligas, PA-C  hydrOXYzine (ATARAX/VISTARIL) 25 MG tablet Take 25 mg by mouth 2 (two) times daily as needed for itching (Sleep).     [provider]  multivitamin (RENA-VIT) TABS tablet Take 1 tablet by mouth every evening.    [provider]  naproxen sodium (ALEVE) 220 MG tablet Take 220 mg by mouth daily.     [provider]  nortriptyline (PAMELOR) 25 MG capsule Take 25 mg by mouth every morning.  01/29/18   [provider]  pantoprazole (PROTONIX) 40 MG tablet Take 40 mg by mouth daily.  12/04/12   [provider]  VENTOLIN HFA 108 (90 Base) MCG/ACT inhaler Inhale 2 puffs into the lungs every 6 (six) hours as needed for wheezing or shortness of breath.  12/21/17   [provider]   Liver Function Tests Recent Labs  Lab 05/26/19 0347  AST 35  ALT 35  ALKPHOS 66  BILITOT 1.4*  PROT 6.8  ALBUMIN 3.5   No results for input(s): LIPASE, AMYLASE in the last 168 hours. CBC Recent Labs  Lab 05/26/19 0347 05/26/19 0348  WBC 9.0  --   NEUTROABS 7.2  --   HGB 11.2* 12.6*  HCT 35.7* 37.0*  MCV 98.3  --   PLT 224  --    Basic Metabolic Panel Recent Labs  Lab 05/26/19 0347 05/26/19 0348  NA 127* 123*  K >7.5* 7.5*  CL 87* 90*  CO2 21*  --   GLUCOSE 101* 92  BUN 86* 87*  CREATININE 10.20* 10.60*  CALCIUM 9.8  --   PHOS 11.7*  --    Iron/TIBC/Ferritin/ %Sat    Component Value Date/Time   IRON 97 12/07/2011 0934   TIBC 247 12/07/2011 0934   FERRITIN 442 (H) 12/07/2011 0934   IRONPCTSAT 39 12/07/2011 0934    Vitals:   05/26/19 0341  Weight: 104.3 kg  Height: 6\' 3"  (1.905  m)    Exam Gen  No rash, cyanosis or gangrene Sclera anicteric, throat clear  No jvd or bruits Chest clear bilat, no rales or wheezing RRR no MRG Abd soft ntnd no mass or ascites +bs GU normal male MS no joint effusions or deformity Ext trace LE edema, no wounds or ulcers Neuro is alert, Ox 3 , nf LUA AVF healing wounds from  surgery in Aug    Home meds:  - amlodipine 10 qd/ carvedilol 3.125 bid/ clonidine 0.2 qd  - atorvastatin 80 qd  - pantoprazole 40 qd/ fosrenol tid ac  - duloxetine 60/ gabapentin 300 hx/ nortriptyline 25    Outpt HD: [place] MWF   4.5h  450/1.5  100kg   2/2 bath  Hep 8527   LUA AVF (sp plication 7/82/42, stick below and above wounds)       Assessment/ Plan: 1. Severe hyperkalemia 2. Syncope/ complete heart block - K+ related, resolved w/ acute Rx for hyperkalemia in ED. In NSR now w/ LBBB which is his baseline. Repeat EKG.  3. ESRD - usual HD MWF. Has not missed HD. Has large IDWG typically per pt (4-6kg) 4. HTN - cont meds, hold for now until after HD 5. CAD sp stents 6. Anemia ckd - Hb 11 -12 here      Kelly Splinter  MD 05/26/2019, 4:59 AM

## 2019-05-26 NOTE — ED Notes (Signed)
Dr Dayna Barker informed of chem 8 results

## 2019-05-26 NOTE — ED Notes (Signed)
Pt bradied down  Dr Dayna Barker gave epine bristojet 1 amp of atropine and calcium gluconate

## 2019-05-26 NOTE — Consult Note (Signed)
Cardiology Consultation:   Patient ID: GORJE IYER; [id number]; 1965/12/22   Admit date: 05/26/2019 Date of Consult: 05/26/2019  Primary Care Provider: Cyndi Bender, PA-C Primary Cardiologist: No primary care provider on file. Primary Electrophysiologist:  None  Chief Complaint: lightheadedness  Patient Profile:   Marco Arnold is a 53 y.o. male with a hx of ESRD on HD, HTN, HLD, who presents with lightheadedness.  History of Present Illness:   Patient got up to use the restroom and short of breath and lightheaded.  EMS arrived and found him to be in complete heart block.  He was transcutaneously paced and then eventually started conducting again.  He was transferred to the ED.  In the ED had several episodes of complete heart block.  Was found to have a potassium of 7.5.  Was given calcium IV with resolution of the complete heart block.  Denies any chest pain currently.  Past Medical History:  Diagnosis Date   Arthritis    spine   Chronic kidney disease    [place] M[facility]   Coronary artery disease    Dialysis patient [facility]) 2013   Diverticulitis    Hyperlipidemia    Hypertension    dr  Tobie Lords      @ [facility]  med   Neuromuscular disorder [facility])    nerve pain - tx w/cymbalta   Neuropathic pain 09/20/2017   Left mid-thoracic    Past Surgical History:  Procedure Laterality Date   AV FISTULA PLACEMENT  11/10/2011   Procedure: ARTERIOVENOUS (AV) FISTULA CREATION;  Surgeon: Mal Misty, MD;  Location: [place];  Service: Vascular;  Laterality: Left;  Ultrasound guided   CARDIAC CATHETERIZATION  09/19/2018   On stent study at [facility]  02/09/2012   Procedure: INSERTION OF DIALYSIS CATHETER;  Surgeon: Mal Misty, MD;  Location: [place];  Service: Vascular;  Laterality: N/A;   MASS EXCISION Right 09/07/2018   Procedure: EXCISION MASS RIGHT THUMB;  Surgeon: Charlotte Crumb, MD;  Location: [place];  Service:  Orthopedics;  Laterality: Right;  OR AXILLARY BLOCK   REVISON OF ARTERIOVENOUS FISTULA Left 04/08/2019   Procedure: REVISION PLICATION OF LEFT ARM ARTERIOVENOUS FISTULA;  Surgeon: Serafina Mitchell, MD;  Location: [place];  Service: Vascular;  Laterality: Left;   SHUNTOGRAM Left 06/03/2012   Procedure: Earney Mallet;  Surgeon: Conrad Jay, MD;  Location: [facility] CATH LAB;  Service: Cardiovascular;  Laterality: Left;   SHUNTOGRAM Left 01/02/2013   Procedure: SHUNTOGRAM;  Surgeon: Conrad Cayuga, MD;  Location: [facility] CATH LAB;  Service: Cardiovascular;  Laterality: Left;     Inpatient Medications: Scheduled Meds:  calcium gluconate  1 g Intravenous Once   insulin aspart  5 Units Intravenous Once   And   dextrose  1 ampule Intravenous Once   sodium bicarbonate  50 mEq Intravenous Once   sodium zirconium cyclosilicate  10 g Oral Once   Continuous Infusions:  dextrose     PRN Meds:   Home Meds: Prior to Admission medications   Medication Sig Start Date End Date Taking? Authorizing Provider  amLODipine (NORVASC) 10 MG tablet Take 10 mg by mouth at bedtime.     [provider]  aspirin EC 81 MG tablet Take 81 mg by mouth daily.    [provider]  atorvastatin (LIPITOR) 80 MG tablet Take 80 mg by mouth daily.  01/16/19   [provider]  carvedilol (COREG)  3.125 MG tablet Take 3.125 mg by mouth 2 (two) times daily with a meal.  01/16/19   [provider]  cloNIDine (CATAPRES) 0.1 MG tablet Take 0.2 mg by mouth daily. 07/13/16   [provider]  cyclobenzaprine (FLEXERIL) 10 MG tablet Take 20 mg by mouth 2 (two) times daily as needed for muscle spasms.     [provider]  DULoxetine (CYMBALTA) 60 MG capsule Take 60 mg by mouth every morning.  01/29/18   [provider]  FOSRENOL 1000 MG chewable tablet Chew 6,000 mg by mouth 3 (three) times daily with meals.  08/31/17   [provider]  gabapentin (NEURONTIN) 100 MG capsule Take 3  capsules (300 mg total) by mouth at bedtime. 09/20/17   Kathrynn Ducking, MD  HYDROcodone-acetaminophen (NORCO) 5-325 MG tablet Take 1 tablet by mouth every 6 (six) hours as needed for moderate pain. 04/08/19   Dagoberto Ligas, PA-C  hydrOXYzine (ATARAX/VISTARIL) 25 MG tablet Take 25 mg by mouth 2 (two) times daily as needed for itching (Sleep).     [provider]  multivitamin (RENA-VIT) TABS tablet Take 1 tablet by mouth every evening.    [provider]  naproxen sodium (ALEVE) 220 MG tablet Take 220 mg by mouth daily.     [provider]  nortriptyline (PAMELOR) 25 MG capsule Take 25 mg by mouth every morning.  01/29/18   [provider]  pantoprazole (PROTONIX) 40 MG tablet Take 40 mg by mouth daily.  12/04/12   [provider]  VENTOLIN HFA 108 (90 Base) MCG/ACT inhaler Inhale 2 puffs into the lungs every 6 (six) hours as needed for wheezing or shortness of breath.  12/21/17   [provider]    Allergies:    Allergies  Allergen Reactions   Lisinopril Shortness Of Breath    Unable to breath    Social History:   Social History   Socioeconomic History   Marital status: Divorced    Spouse name: Not on file   Number of children: 2   Years of education: 12   Highest education level: Not on file  Occupational History   Occupation: disabled  Scientist, product/process development strain: Not on file   Food insecurity    Worry: Not on file    Inability: Not on file   Transportation needs    Medical: Not on file    Non-medical: Not on file  Tobacco Use   Smoking status: Light Tobacco Smoker    Packs/day: 0.25    Years: 43.00    Pack years: 10.75    Types: Cigarettes   Smokeless tobacco: Never Used   Tobacco comment: pt states that he is trying to quit--is trying to cut back at the current time  Substance and Sexual Activity   Alcohol use: Yes    Comment: occasional wine   Drug use: Yes    Types: Marijuana     Comment: Last dose Friday 04/04/19   Sexual activity: Not on file  Lifestyle   Physical activity    Days per week: Not on file    Minutes per session: Not on file   Stress: Not on file  Relationships   Social connections    Talks on phone: Not on file    Gets together: Not on file    Attends religious service: Not on file    Active member of club or organization: Not on file    Attends meetings of  clubs or organizations: Not on file    Relationship status: Not on file   Intimate partner violence    Fear of current or ex partner: Not on file    Emotionally abused: Not on file    Physically abused: Not on file    Forced sexual activity: Not on file  Other Topics Concern   Not on file  Social History Narrative   Lives with father   Caffeine use: none   Right handed      Family History:    Family History  Problem Relation Age of Onset   Pancreatic cancer Mother    Diabetes Father    Hypertension Father    Cancer - Lung Paternal Grandfather       ROS:  Please see the history of present illness.  All other ROS reviewed and negative.     Physical Exam/Data:   Vitals:   05/26/19 0341  Weight: 104.3 kg  Height: 6\' 3"  (1.905 m)   No intake or output data in the 24 hours ending 05/26/19 0425 Last 3 Weights 05/26/2019 04/28/2019 04/08/2019  Weight (lbs) 230 lb 224 lb 219 lb 5.7 oz  Weight (kg) 104.327 kg 101.606 kg 99.5 kg     Body mass index is 28.75 kg/m.  General: Well developed, well nourished, in no acute distress. Head: Normocephalic, atraumatic, sclera non-icteric, no xanthomas, nares are without discharge.  Neck: Negative for carotid bruits. JVD not elevated. Lungs: Clear bilaterally to auscultation without wheezes, rales, or rhonchi. Breathing is unlabored. Heart: RRR with S1 S2. No murmurs, rubs, or gallops appreciated. Abdomen: Soft, non-tender, non-distended with normoactive bowel sounds. No hepatomegaly. No rebound/guarding. No obvious abdominal  masses. Msk:  Strength and tone appear normal for age. Extremities: No clubbing or cyanosis. No edema.  Distal pedal pulses are 2+ and equal bilaterally. Neuro: Alert and oriented X 3. No facial asymmetry. No focal deficit. Moves all extremities spontaneously. Psych:  Responds to questions appropriately with a normal affect.   EKG:  The EKG was personally reviewed and demonstrates:  RBBB, LAD Telemetry:  Telemetry was personally reviewed and demonstrates:  sinus  Relevant CV Studies: None  Laboratory Data:  High Sensitivity Troponin:  No results for input(s): TROPONINIHS in the last 720 hours.   Cardiac EnzymesNo results for input(s): TROPONINI in the last 168 hours. No results for input(s): TROPIPOC in the last 168 hours.  Chemistry Recent Labs  Lab 05/26/19 0348  NA 123*  K 7.5*  CL 90*  GLUCOSE 92  BUN 87*  CREATININE 10.60*    No results for input(s): PROT, ALBUMIN, AST, ALT, ALKPHOS, BILITOT in the last 168 hours. Hematology Recent Labs  Lab 05/26/19 0347 05/26/19 0348  WBC 9.0  --   RBC 3.63*  --   HGB 11.2* 12.6*  HCT 35.7* 37.0*  MCV 98.3  --   MCH 30.9  --   MCHC 31.4  --   RDW 18.0*  --   PLT 224  --    BNPNo results for input(s): BNP, PROBNP in the last 168 hours.  DDimer No results for input(s): DDIMER in the last 168 hours.   Radiology/Studies:  No results found.  Assessment and Plan:   1. Complete heart block  Likely due to electrolyte abnormalities (K=7.5).  Further management per ED and medicine.  If bradycardia persists after correction of electrolytes we can consider pacemaker.      For questions or updates, please contact [place] Please consult [email or web address] for contact  info under     Signed, Chriss Czar, MD  05/26/2019 4:25 AM

## 2019-05-26 NOTE — ED Triage Notes (Signed)
The pt arrived by [facility] from home [facility] this am  The pt had heart rate of 20  Syncopal episode  Paced at first but the pacer was removed   Alert oriented skin warm and d ry  Dialysis pt dialyzed last Friday  Graft lt upper arm  No pain or discomfort at present

## 2019-05-26 NOTE — Progress Notes (Signed)
[facility] RIO [id number]  Code Status: FULL Admission Data: 05/26/2019 2:24 PM  Attending Provider: Marthenia Rolling  FYB:OFBPZW, Tamala Julian  Consults/ Treatment Team: Treatment Team:  Lbcardiology, Michae Kava, MD Roney Jaffe, MD  Dedra Skeens is a 53 y.o. male patient admitted from Ed, then dialysis, awake, alert - oriented X 4 - no acute distress noted. VSS - Blood pressure (!) 167/93, pulse 85, temperature (!) 97.5 F (36.4 C), temperature source Oral, resp. rate 18, height 6\' 3"  (1.905 m), weight 104.3 kg, SpO2 100 %. no c/o shortness of breath, no c/o chest pain. Orientation to room, and floor completed with information packet given to patient/family.  Admission INP armband ID verified with patient/family, and in place.  SR up x 2, fall assessment complete, with patient and family able to verbalize understanding of risk associated with falls, and verbalized understanding to call nsg before up out of bed. Call light within reach, patient able to voice, and demonstrate understanding. Skin, clean-dry- intact. Old scabs noted to bilateral legs. No evidence of skin break down noted on exam.  ?  Will cont to eval and treat per MD orders.  Melonie [place], RN  05/26/2019 2:24 PM

## 2019-05-27 DIAGNOSIS — N186 End stage renal disease: Secondary | ICD-10-CM | POA: Diagnosis not present

## 2019-05-27 DIAGNOSIS — D638 Anemia in other chronic diseases classified elsewhere: Secondary | ICD-10-CM | POA: Diagnosis not present

## 2019-05-27 DIAGNOSIS — N2581 Secondary hyperparathyroidism of renal origin: Secondary | ICD-10-CM | POA: Diagnosis not present

## 2019-05-27 DIAGNOSIS — Z20828 Contact with and (suspected) exposure to other viral communicable diseases: Secondary | ICD-10-CM | POA: Diagnosis not present

## 2019-05-27 DIAGNOSIS — G8929 Other chronic pain: Secondary | ICD-10-CM | POA: Diagnosis not present

## 2019-05-27 DIAGNOSIS — Z23 Encounter for immunization: Secondary | ICD-10-CM | POA: Diagnosis not present

## 2019-05-27 DIAGNOSIS — Z992 Dependence on renal dialysis: Secondary | ICD-10-CM | POA: Diagnosis not present

## 2019-05-27 DIAGNOSIS — D631 Anemia in chronic kidney disease: Secondary | ICD-10-CM | POA: Diagnosis not present

## 2019-05-27 DIAGNOSIS — M545 Low back pain: Secondary | ICD-10-CM | POA: Diagnosis not present

## 2019-05-27 DIAGNOSIS — I1311 Hypertensive heart and chronic kidney disease without heart failure, with stage 5 chronic kidney disease, or end stage renal disease: Secondary | ICD-10-CM | POA: Diagnosis not present

## 2019-05-27 DIAGNOSIS — I2510 Atherosclerotic heart disease of native coronary artery without angina pectoris: Secondary | ICD-10-CM | POA: Diagnosis not present

## 2019-05-27 DIAGNOSIS — M1990 Unspecified osteoarthritis, unspecified site: Secondary | ICD-10-CM | POA: Diagnosis not present

## 2019-05-27 DIAGNOSIS — S41102A Unspecified open wound of left upper arm, initial encounter: Secondary | ICD-10-CM | POA: Diagnosis not present

## 2019-05-27 DIAGNOSIS — E875 Hyperkalemia: Secondary | ICD-10-CM

## 2019-05-27 DIAGNOSIS — Z8249 Family history of ischemic heart disease and other diseases of the circulatory system: Secondary | ICD-10-CM | POA: Diagnosis not present

## 2019-05-27 DIAGNOSIS — E785 Hyperlipidemia, unspecified: Secondary | ICD-10-CM | POA: Diagnosis not present

## 2019-05-27 LAB — CBC
HCT: 33.1 % — ABNORMAL LOW (ref 39.0–52.0)
Hemoglobin: 10.6 g/dL — ABNORMAL LOW (ref 13.0–17.0)
MCH: 31 pg (ref 26.0–34.0)
MCHC: 32 g/dL (ref 30.0–36.0)
MCV: 96.8 fL (ref 80.0–100.0)
Platelets: 173 10*3/uL (ref 150–400)
RBC: 3.42 MIL/uL — ABNORMAL LOW (ref 4.22–5.81)
RDW: 18.1 % — ABNORMAL HIGH (ref 11.5–15.5)
WBC: 7.8 10*3/uL (ref 4.0–10.5)
nRBC: 0 % (ref 0.0–0.2)

## 2019-05-27 LAB — HEPATITIS B E ANTIGEN: Hep B E Ag: NEGATIVE

## 2019-05-27 LAB — BASIC METABOLIC PANEL
Anion gap: 18 — ABNORMAL HIGH (ref 5–15)
BUN: 55 mg/dL — ABNORMAL HIGH (ref 6–20)
CO2: 22 mmol/L (ref 22–32)
Calcium: 9.1 mg/dL (ref 8.9–10.3)
Chloride: 88 mmol/L — ABNORMAL LOW (ref 98–111)
Creatinine, Ser: 8.09 mg/dL — ABNORMAL HIGH (ref 0.61–1.24)
GFR calc Af Amer: 8 mL/min — ABNORMAL LOW (ref >60)
GFR calc non Af Amer: 7 mL/min — ABNORMAL LOW (ref >60)
Glucose, Bld: 100 mg/dL — ABNORMAL HIGH (ref 70–99)
Potassium: 5.3 mmol/L — ABNORMAL HIGH (ref 3.5–5.1)
Sodium: 128 mmol/L — ABNORMAL LOW (ref 135–145)

## 2019-05-27 LAB — HIV ANTIBODY (ROUTINE TESTING W REFLEX): HIV Screen 4th Generation wRfx: NONREACTIVE

## 2019-05-27 LAB — RENAL FUNCTION PANEL
Albumin: 3.3 g/dL — ABNORMAL LOW (ref 3.5–5.0)
Anion gap: 20 — ABNORMAL HIGH (ref 5–15)
BUN: 57 mg/dL — ABNORMAL HIGH (ref 6–20)
CO2: 21 mmol/L — ABNORMAL LOW (ref 22–32)
Calcium: 9.2 mg/dL (ref 8.9–10.3)
Chloride: 89 mmol/L — ABNORMAL LOW (ref 98–111)
Creatinine, Ser: 7.46 mg/dL — ABNORMAL HIGH (ref 0.61–1.24)
GFR calc Af Amer: 9 mL/min — ABNORMAL LOW (ref >60)
GFR calc non Af Amer: 8 mL/min — ABNORMAL LOW (ref >60)
Glucose, Bld: 86 mg/dL (ref 70–99)
Phosphorus: 9.7 mg/dL — ABNORMAL HIGH (ref 2.5–4.6)
Potassium: 5.5 mmol/L — ABNORMAL HIGH (ref 3.5–5.1)
Sodium: 130 mmol/L — ABNORMAL LOW (ref 135–145)

## 2019-05-27 LAB — GLUCOSE, CAPILLARY: Glucose-Capillary: 118 mg/dL — ABNORMAL HIGH (ref 70–99)

## 2019-05-27 LAB — HEPATITIS PANEL, ACUTE
HCV Ab: <0.1 s/co ratio (ref 0.0–0.9)
Hep A IgM: NEGATIVE
Hep B C IgM: NEGATIVE
Hepatitis B Surface Ag: NEGATIVE

## 2019-05-27 LAB — MAGNESIUM: Magnesium: 2.3 mg/dL (ref 1.7–2.4)

## 2019-05-27 LAB — POTASSIUM: Potassium: 5.6 mmol/L — ABNORMAL HIGH (ref 3.5–5.1)

## 2019-05-27 LAB — HEPATITIS B CORE ANTIBODY, TOTAL: Hep B Core Total Ab: NEGATIVE

## 2019-05-27 MED FILL — Medication: Qty: 1 | Status: AC

## 2019-05-27 NOTE — Progress Notes (Signed)
[facility] Progress Note   Subjective:  Completed HD yesterday with 5L UF. Feels "great" today Denies CP/SOB Plans for DC today - will have extra HD at outpatient dialysis center today.   Objective Vitals:   05/27/19 0404 05/27/19 0419 05/27/19 0535 05/27/19 0955  BP: (!) 182/103  (!) 156/89   Pulse: 91  87   Resp: (!) 22  16   Temp:  98.4 F (36.9 C)    TempSrc:  Oral    SpO2: 91%  91%   Weight:    107.8 kg  Height:        Physical Exam General: WNWD NAD Heart: RRR Lungs: Crackles at bases bilat Abdomen: Soft NTND Extremities: Trace LE edema  Dialysis Access: L AVF +bruit    Weight change:    Additional Objective Labs: Basic Metabolic Panel: Recent Labs  Lab 05/26/19 0347  05/26/19 0623  05/26/19 2124 05/27/19 0226 05/27/19 0710  NA 127*   < > 128*  --   --  130* 128*  K >7.5*   < > 6.5*   < > 5.0 5.5* 5.3*  CL 87*   < > 87*  --   --  89* 88*  CO2 21*  --  22  --   --  21* 22  GLUCOSE 101*   < > 72  --   --  86 100*  BUN 86*   < > 88*  --   --  57* 55*  CREATININE 10.20*   < > 10.39*  --   --  7.46* 8.09*  CALCIUM 9.8  --  10.0  --   --  9.2 9.1  PHOS 11.7*  --   --   --   --  9.7*  --    < > = values in this interval not displayed.   CBC: Recent Labs  Lab 05/26/19 0347 05/26/19 0348 05/27/19 0710  WBC 9.0  --  7.8  NEUTROABS 7.2  --   --   HGB 11.2* 12.6* 10.6*  HCT 35.7* 37.0* 33.1*  MCV 98.3  --  96.8  PLT 224  --  173   Blood Culture    Component Value Date/Time   SDES BLOOD RIGHT HAND 04/26/2019 0615   SPECREQUEST  04/26/2019 0615    BOTTLES DRAWN AEROBIC ONLY Blood Culture adequate volume   CULT  04/26/2019 0615    NO GROWTH 5 DAYS Performed at [facility], [place] [address] [place], [place] [address]    REPTSTATUS 05/01/2019 FINAL 04/26/2019 0615     Medications: . sodium chloride     . aspirin EC  81 mg Oral Daily  . atorvastatin  80 mg Oral q1800  . Chlorhexidine Gluconate Cloth  6 each Topical Q0600   . gabapentin  300 mg Oral QHS  . heparin  5,000 Units Subcutaneous Q8H  . lanthanum  6,000 mg Oral TID WC  . nortriptyline  25 mg Oral Daily  . pantoprazole  40 mg Oral Daily  . sodium chloride flush  3 mL Intravenous Q12H    Outpt HD: [place] MWF   4.5h  450/1.5  100kg   2/2 bath  Hep 8466   LUA AVF (sp plication 5/99/35, stick below and above wounds)    Assessment/ Plan: 1. Severe hyperkalemia. K+ 7.5 on presentation. Improved with HD. Counseled about low K+ diet.  Will have extra HD at outpatient center today  2. Syncope/ complete heart block - K+ related, resolved  w/ acute Rx for hyperkalemia in ED. In NSR now w/ LBBB which is his baseline. Repeat EKG.  3. ESRD - usual HD MWF. Has not missed HD. Has large IDWG typically per pt (4-6kg). Extra HD again  today for volume removal. Will resume at outpatient center.  4. HTN - cont meds, hold for now until after HD 5. CAD sp stents 6. Anemia ckd - Hb 11 -12 here  Lynnda Child PA-C [facility] Pager [phone] 05/27/2019,10:08 AM  LOS: 0 days

## 2019-05-27 NOTE — Progress Notes (Signed)
Renal Navigator received call from Nephrologist/Dr. Jonnie Finner that patient will be discharged today, but needs extra HD treatment today that can be done at the OP HD clinic if he can be scheduled there. Renal Navigator spoke with [facility] who can accommodate him at 12:20pm. He needs to arrive at 12:00pm. Renal Navigator notified medical team and patient.  [name], [place] Renal Navigator [phone]

## 2019-05-27 NOTE — Discharge Summary (Signed)
Physician Discharge Summary  GAINES CARTMELL BJS:[id number] DOB: 07/20/1966 DOA: 05/26/2019  PCP: Cyndi Bender, PA-C  Admit date: 05/26/2019 Discharge date: 05/27/2019  Admitted From: Home Disposition:  Home  Discharge Condition:Stable CODE STATUS:FULL Diet recommendation: Heart Healthy   Brief/Interim Summary:  Patient is a 53 year old Caucasian male with past medical history significant for coronary artery disease status post PCI, ESRD on hemodialysis for the last 7 years, hypertension, hyperlipidemia and chronic pain.  Patient presented with lightheadedness and near syncopal episode, with heart rate of 20 bpm and complete heart block.  Potassium was 7.5 on presentation.  Heart block was thought to be due to hyperkalemia. Patient has undergone hemodialysis.  Heart block has resolved.    Cardiology was also consulted.  He remains hemodynamically stable.  After discussing with nephrology, we decided to discharge the patient.  He has outpatient hemodialysis scheduled for today.  Following problems were addressed during his hospitalization:  1. Hyperkalemia; ESRD  - Presents with syncope, found to be in complete heart block with potassium of 7.5 despite reportedly completing HD without incident on 9/25  - He was treated in ED with IV caclium, insulin/dextrose, bicarbonate, and Lokelma  - Nephrology was consulted and he underwent  urgent HD  -Next HD today as an outpatient.  2. Complete heart block; syncope  - Presents with lightheadedness, SOB, and syncope, found to be CHB  - Appreciate cardiology evaluation, likely secondary to severe hyperkalemia - Resolved with hemodialysis -We will resume home beta blocker.   3. CAD - No anginal complaints  - Continue ASA and statin   4. Hypertension  - Continue home regimen  5. Hyponatremia  - Serum sodium 127 on admission  - Secondary to hypervolemic status -Anticipate improvement with dialysis  Discharge Diagnoses:  Principal  Problem:   Hyperkalemia Active Problems:   ESRD (end stage renal disease) (HCC)   HTN (hypertension)   Hyponatremia   CAD (coronary artery disease)   Complete heart block, transient [facility])    Discharge Instructions  Discharge Instructions    Diet - low sodium heart healthy   Complete by: As directed    Discharge instructions   Complete by: As directed    1)Please follow up with your PCP in a week.   Increase activity slowly   Complete by: As directed      Allergies as of 05/27/2019      Reactions   Lisinopril Shortness Of Breath   Unable to breath      Medication List    TAKE these medications   amLODipine 10 MG tablet Commonly known as: NORVASC Take 10 mg by mouth at bedtime.   aspirin EC 81 MG tablet Take 81 mg by mouth daily.   atorvastatin 80 MG tablet Commonly known as: LIPITOR Take 80 mg by mouth daily.   carvedilol 3.125 MG tablet Commonly known as: COREG Take 3.125 mg by mouth 2 (two) times daily with a meal.   cloNIDine 0.1 MG tablet Commonly known as: CATAPRES Take 0.2 mg by mouth daily.   cyclobenzaprine 10 MG tablet Commonly known as: FLEXERIL Take 20 mg by mouth 2 (two) times daily as needed for muscle spasms.   DULoxetine 60 MG capsule Commonly known as: CYMBALTA Take 60 mg by mouth every morning.   Fosrenol 1000 MG chewable tablet Generic drug: lanthanum Chew 6,000 mg by mouth 3 (three) times daily with meals.   gabapentin 100 MG capsule Commonly known as: NEURONTIN Take 3 capsules (300 mg total) by mouth  at bedtime.   HYDROcodone-acetaminophen 5-325 MG tablet Commonly known as: Norco Take 1 tablet by mouth every 6 (six) hours as needed for moderate pain.   hydrOXYzine 25 MG tablet Commonly known as: ATARAX/VISTARIL Take 25 mg by mouth 2 (two) times daily as needed for itching (Sleep).   multivitamin Tabs tablet Take 1 tablet by mouth See admin instructions. Every Monday, Wednesday and Friday   naproxen sodium 220 MG  tablet Commonly known as: ALEVE Take 220 mg by mouth 2 (two) times daily as needed (pain).   nortriptyline 25 MG capsule Commonly known as: PAMELOR Take 25 mg by mouth every morning.   pantoprazole 40 MG tablet Commonly known as: PROTONIX Take 40 mg by mouth daily.   Ventolin HFA 108 (90 Base) MCG/ACT inhaler Generic drug: albuterol Inhale 2 puffs into the lungs every 6 (six) hours as needed for wheezing or shortness of breath.      Follow-up Information    Cyndi Bender, PA-C. Schedule an appointment as soon as possible for a visit in 1 week(s).   Specialty: Physician Assistant Contact information: [place] [address] [phone]          Allergies  Allergen Reactions  . Lisinopril Shortness Of Breath    Unable to breath    Consultations:  Nephrology,cardiology   Procedures/Studies: Dg Chest Portable 1 View  Result Date: 05/26/2019 CLINICAL DATA:  Chest pain EXAM: PORTABLE CHEST 1 VIEW COMPARISON:  08/15/2016 FINDINGS: Cardiomegaly. Normal aortic contours. Artifact from EKG leads and defibrillator pads. There is no edema, consolidation, effusion, or pneumothorax. IMPRESSION: Cardiomegaly without failure. Electronically Signed   By: Monte Fantasia M.D.   On: 05/26/2019 04:44       Subjective:  Patient seen and examined at the bed side.Hemodynamically stable for DC.  Discharge Exam: Vitals:   05/27/19 0419 05/27/19 0535  BP:  (!) 156/89  Pulse:  87  Resp:  16  Temp: 98.4 F (36.9 C)   SpO2:  91%   Vitals:   05/27/19 0404 05/27/19 0419 05/27/19 0535 05/27/19 0955  BP: (!) 182/103  (!) 156/89   Pulse: 91  87   Resp: (!) 22  16   Temp:  98.4 F (36.9 C)    TempSrc:  Oral    SpO2: 91%  91%   Weight:    107.8 kg  Height:        General: Pt is alert, awake, not in acute distress Cardiovascular: RRR, S1/S2 +, no rubs, no gallops Respiratory: CTA bilaterally, no wheezing, no rhonchi Abdominal: Soft, NT, ND, bowel sounds  + Extremities: no edema, no cyanosis    The results of significant diagnostics from this hospitalization (including imaging, microbiology, ancillary and laboratory) are listed below for reference.     Microbiology: Recent Results (from the past 240 hour(s))  SARS Coronavirus 2 [facility] order, Performed in [facility] hospital lab)     Status: None   Collection Time: 05/26/19  3:34 AM  Result Value Ref Range Status   SARS Coronavirus 2 NEGATIVE NEGATIVE Final    Comment: (NOTE) If result is NEGATIVE SARS-CoV-2 target nucleic acids are NOT DETECTED. The SARS-CoV-2 RNA is generally detectable in upper and lower  respiratory specimens during the acute phase of infection. The lowest  concentration of SARS-CoV-2 viral copies this assay can detect is 250  copies / mL. A negative result does not preclude SARS-CoV-2 infection  and should not be used as the sole basis for treatment or other  patient management decisions.  A negative result may occur with  improper specimen collection / handling, submission of specimen other  than nasopharyngeal swab, presence of viral mutation(s) within the  areas targeted by this assay, and inadequate number of viral copies  (<250 copies / mL). A negative result must be combined with clinical  observations, patient history, and epidemiological information. If result is POSITIVE SARS-CoV-2 target nucleic acids are DETECTED. The SARS-CoV-2 RNA is generally detectable in upper and lower  respiratory specimens dur ing the acute phase of infection.  Positive  results are indicative of active infection with SARS-CoV-2.  Clinical  correlation with patient history and other diagnostic information is  necessary to determine patient infection status.  Positive results do  not rule out bacterial infection or co-infection with other viruses. If result is PRESUMPTIVE POSTIVE SARS-CoV-2 nucleic acids MAY BE PRESENT.   A presumptive positive result was obtained on  the submitted specimen  and confirmed on repeat testing.  While 2019 novel coronavirus  (SARS-CoV-2) nucleic acids may be present in the submitted sample  additional confirmatory testing may be necessary for epidemiological  and / or clinical management purposes  to differentiate between  SARS-CoV-2 and other Sarbecovirus currently known to infect humans.  If clinically indicated additional testing with an alternate test  methodology [phone]) is advised. The SARS-CoV-2 RNA is generally  detectable in upper and lower respiratory sp ecimens during the acute  phase of infection. The expected result is Negative. Fact Sheet for Patients:  StrictlyIdeas.no Fact Sheet for Healthcare Providers: BankingDealers.co.za This test is not yet approved or cleared by the [place] FDA and has been authorized for detection and/or diagnosis of SARS-CoV-2 by FDA under an Emergency Use Authorization (EUA).  This EUA will remain in effect (meaning this test can be used) for the duration of the COVID-19 declaration under Section 564(b)(1) of the Act, 21 U.S.C. section 360bbb-3(b)(1), unless the authorization is terminated or revoked sooner. Performed at [facility], [place] [address] [place], [place] [address]      Labs: BNP (last 3 results) No results for input(s): BNP in the last 8760 hours. Basic Metabolic Panel: Recent Labs  Lab 05/26/19 0347 05/26/19 0348 05/26/19 0623 05/26/19 1315 05/26/19 1743 05/26/19 2124 05/27/19 0226 05/27/19 0710  NA 127* 123* 128*  --   --   --  130* 128*  K >7.5* 7.5* 6.5* 4.8 5.0 5.0 5.5* 5.3*  CL 87* 90* 87*  --   --   --  89* 88*  CO2 21*  --  22  --   --   --  21* 22  GLUCOSE 101* 92 72  --   --   --  86 100*  BUN 86* 87* 88*  --   --   --  57* 55*  CREATININE 10.20* 10.60* 10.39*  --   --   --  7.46* 8.09*  CALCIUM 9.8  --  10.0  --   --   --  9.2 9.1  MG 2.5*  --   --   --   --   --   --  2.3   PHOS 11.7*  --   --   --   --   --  9.7*  --    Liver Function Tests: Recent Labs  Lab 05/26/19 0347 05/27/19 0226  AST 35  --   ALT 35  --   ALKPHOS 66  --   BILITOT 1.4*  --  PROT 6.8  --   ALBUMIN 3.5 3.3*   No results for input(s): LIPASE, AMYLASE in the last 168 hours. No results for input(s): AMMONIA in the last 168 hours. CBC: Recent Labs  Lab 05/26/19 0347 05/26/19 0348 05/27/19 0710  WBC 9.0  --  7.8  NEUTROABS 7.2  --   --   HGB 11.2* 12.6* 10.6*  HCT 35.7* 37.0* 33.1*  MCV 98.3  --  96.8  PLT 224  --  173   Cardiac Enzymes: No results for input(s): CKTOTAL, CKMB, CKMBINDEX, TROPONINI in the last 168 hours. BNP: Invalid input(s): POCBNP CBG: Recent Labs  Lab 05/27/19 0801  GLUCAP 118*   D-Dimer No results for input(s): DDIMER in the last 72 hours. Hgb A1c No results for input(s): HGBA1C in the last 72 hours. Lipid Profile No results for input(s): CHOL, HDL, LDLCALC, TRIG, CHOLHDL, LDLDIRECT in the last 72 hours. Thyroid function studies No results for input(s): TSH, T4TOTAL, T3FREE, THYROIDAB in the last 72 hours.  Invalid input(s): FREET3 Anemia work up No results for input(s): VITAMINB12, FOLATE, FERRITIN, TIBC, IRON, RETICCTPCT in the last 72 hours. Urinalysis    Component Value Date/Time   COLORURINE YELLOW 11/07/2011 1535   APPEARANCEUR CLEAR 11/07/2011 1535   LABSPEC 1.011 11/07/2011 1535   PHURINE 5.5 11/07/2011 1535   GLUCOSEU 100 (A) 11/07/2011 1535   HGBUR LARGE (A) 11/07/2011 1535   BILIRUBINUR NEGATIVE 11/07/2011 1535   KETONESUR NEGATIVE 11/07/2011 1535   PROTEINUR 100 (A) 11/07/2011 1535   UROBILINOGEN 0.2 11/07/2011 1535   NITRITE NEGATIVE 11/07/2011 1535   LEUKOCYTESUR NEGATIVE 11/07/2011 1535   Sepsis Labs Invalid input(s): PROCALCITONIN,  WBC,  LACTICIDVEN Microbiology Recent Results (from the past 240 hour(s))  SARS Coronavirus 2 [facility] order, Performed in [place] hospital lab)     Status: None   Collection  Time: 05/26/19  3:34 AM  Result Value Ref Range Status   SARS Coronavirus 2 NEGATIVE NEGATIVE Final    Comment: (NOTE) If result is NEGATIVE SARS-CoV-2 target nucleic acids are NOT DETECTED. The SARS-CoV-2 RNA is generally detectable in upper and lower  respiratory specimens during the acute phase of infection. The lowest  concentration of SARS-CoV-2 viral copies this assay can detect is 250  copies / mL. A negative result does not preclude SARS-CoV-2 infection  and should not be used as the sole basis for treatment or other  patient management decisions.  A negative result may occur with  improper specimen collection / handling, submission of specimen other  than nasopharyngeal swab, presence of viral mutation(s) within the  areas targeted by this assay, and inadequate number of viral copies  (<250 copies / mL). A negative result must be combined with clinical  observations, patient history, and epidemiological information. If result is POSITIVE SARS-CoV-2 target nucleic acids are DETECTED. The SARS-CoV-2 RNA is generally detectable in upper and lower  respiratory specimens dur ing the acute phase of infection.  Positive  results are indicative of active infection with SARS-CoV-2.  Clinical  correlation with patient history and other diagnostic information is  necessary to determine patient infection status.  Positive results do  not rule out bacterial infection or co-infection with other viruses. If result is PRESUMPTIVE POSTIVE SARS-CoV-2 nucleic acids MAY BE PRESENT.   A presumptive positive result was obtained on the submitted specimen  and confirmed on repeat testing.  While 2019 novel coronavirus  (SARS-CoV-2) nucleic acids may be present in the submitted sample  additional confirmatory testing may be  necessary for epidemiological  and / or clinical management purposes  to differentiate between  SARS-CoV-2 and other Sarbecovirus currently known to infect humans.  If  clinically indicated additional testing with an alternate test  methodology [phone]) is advised. The SARS-CoV-2 RNA is generally  detectable in upper and lower respiratory sp ecimens during the acute  phase of infection. The expected result is Negative. Fact Sheet for Patients:  StrictlyIdeas.no Fact Sheet for Healthcare Providers: BankingDealers.co.za This test is not yet approved or cleared by the [place] FDA and has been authorized for detection and/or diagnosis of SARS-CoV-2 by FDA under an Emergency Use Authorization (EUA).  This EUA will remain in effect (meaning this test can be used) for the duration of the COVID-19 declaration under Section 564(b)(1) of the Act, 21 U.S.C. section 360bbb-3(b)(1), unless the authorization is terminated or revoked sooner. Performed at [facility], [place] [address] [place], [place] [address]     Please note: You were cared for by a hospitalist during your hospital stay. Once you are discharged, your primary care physician will handle any further medical issues. Please note that NO REFILLS for any discharge medications will be authorized once you are discharged, as it is imperative that you return to your primary care physician (or establish a relationship with a primary care physician if you do not have one) for your post hospital discharge needs so that they can reassess your need for medications and monitor your lab values.    Time coordinating discharge: 40 minutes  SIGNED:   Shelly Coss, MD  [facility] 05/27/2019, 10:11 AM Pager [phone]  If 7PM-7AM, please contact night-coverage [email or web address] Password [email or web address]

## 2019-05-27 NOTE — Progress Notes (Signed)
Marco Arnold to be D/C'd home per MD order. Discussed with the patient and all questions fully answered. VVS, Skin clean, dry and intact without evidence of skin break down, no evidence of skin tears noted.  IV catheters discontinued intact. Site without signs and symptoms of complications. Dressing and pressure applied.  An After Visit Summary was printed and given to the patient.  Patient escorted via [place], and D/C home via private auto.  Marco Arnold  05/27/2019 11:17 AM

## 2019-05-27 NOTE — Care Management Obs Status (Signed)
[facility] NOTIFICATION   Patient Details  Name: Marco Arnold MRN: [id number] Date of Birth: [birth year]   Medicare Observation Status Notification Given:  Yes    Benard Halsted, LCSW 05/27/2019, 10:50 AM

## 2019-05-28 DIAGNOSIS — D631 Anemia in chronic kidney disease: Secondary | ICD-10-CM | POA: Diagnosis not present

## 2019-05-28 DIAGNOSIS — Z992 Dependence on renal dialysis: Secondary | ICD-10-CM | POA: Diagnosis not present

## 2019-05-28 DIAGNOSIS — N186 End stage renal disease: Secondary | ICD-10-CM | POA: Diagnosis not present

## 2019-05-28 DIAGNOSIS — S41102A Unspecified open wound of left upper arm, initial encounter: Secondary | ICD-10-CM | POA: Diagnosis not present

## 2019-05-28 DIAGNOSIS — N2581 Secondary hyperparathyroidism of renal origin: Secondary | ICD-10-CM | POA: Diagnosis not present

## 2019-05-28 DIAGNOSIS — Z23 Encounter for immunization: Secondary | ICD-10-CM | POA: Diagnosis not present

## 2019-05-29 ENCOUNTER — Other Ambulatory Visit: Payer: Self-pay

## 2019-05-29 ENCOUNTER — Ambulatory Visit (INDEPENDENT_AMBULATORY_CARE_PROVIDER_SITE_OTHER): Payer: Self-pay | Admitting: Physician Assistant

## 2019-05-29 VITALS — BP 132/75 | HR 87 | Temp 97.1°F | Resp 16 | Ht 75.5 in | Wt 235.0 lb

## 2019-05-29 DIAGNOSIS — I120 Hypertensive chronic kidney disease with stage 5 chronic kidney disease or end stage renal disease: Secondary | ICD-10-CM | POA: Diagnosis not present

## 2019-05-29 DIAGNOSIS — Z992 Dependence on renal dialysis: Secondary | ICD-10-CM | POA: Diagnosis not present

## 2019-05-29 DIAGNOSIS — N186 End stage renal disease: Secondary | ICD-10-CM | POA: Diagnosis not present

## 2019-05-29 NOTE — Progress Notes (Signed)
    Postoperative Access Visit   History of Present Illness   Marco Arnold is a 53 y.o. year old male who presents for postoperative follow-up for incision check after plication of left arm brachiocephalic fistula by Dr. Trula Slade on 04/08/2019.  Postoperatively he developed some cellulitis and slow to heal incision.  He has now completed the IV antibiotics which have been giving during hemodialysis over the past month to 6 weeks.  Patient states left arm feels much better and he believes it is completely healed.  He is dialyzing from fistula without complication on a Monday Wednesday Friday schedule at the kidney center in [facility]  He denies fevers, chills, nausea/vomiting.  He has an appointment with transplant team at [facility] to potentially be placed on the kidney transplant list.   Physical Examination   Vitals:   05/29/19 1300  BP: 132/75  Pulse: 87  Resp: 16  Temp: (!) 97.1 F (36.2 C)  TempSrc: Temporal  SpO2: 99%  Weight: 235 lb (106.6 kg)  Height: 6' 3.5" (1.918 m)   Body mass index is 28.99 kg/m.  left arm Incisions are healed, hand grip is 5/5, sensation in digits is intact, palpable thrill, bruit can  be auscultated, palpable left radial pulse    Medical Decision Making   Marco Arnold is a 53 y.o. year old male who presents s/p left arm fistula plication complicated by postoperative surgical wound infection   Patent fistula without signs or symptoms of steal syndrome  Incisions are now well-healed with no signs of ongoing infection  Okay to start cannulating fistula around areas of incision  The patient may follow up on a prn basis   Marco Ligas PA-C [facility] Office: [phone]  Clinic MD: Dr. Oneida Alar

## 2019-05-30 DIAGNOSIS — D631 Anemia in chronic kidney disease: Secondary | ICD-10-CM | POA: Diagnosis not present

## 2019-05-30 DIAGNOSIS — D509 Iron deficiency anemia, unspecified: Secondary | ICD-10-CM | POA: Diagnosis not present

## 2019-05-30 DIAGNOSIS — Z992 Dependence on renal dialysis: Secondary | ICD-10-CM | POA: Diagnosis not present

## 2019-05-30 DIAGNOSIS — N186 End stage renal disease: Secondary | ICD-10-CM | POA: Diagnosis not present

## 2019-05-30 DIAGNOSIS — N2581 Secondary hyperparathyroidism of renal origin: Secondary | ICD-10-CM | POA: Diagnosis not present

## 2019-06-02 DIAGNOSIS — D631 Anemia in chronic kidney disease: Secondary | ICD-10-CM | POA: Diagnosis not present

## 2019-06-02 DIAGNOSIS — Z992 Dependence on renal dialysis: Secondary | ICD-10-CM | POA: Diagnosis not present

## 2019-06-02 DIAGNOSIS — N186 End stage renal disease: Secondary | ICD-10-CM | POA: Diagnosis not present

## 2019-06-02 DIAGNOSIS — D509 Iron deficiency anemia, unspecified: Secondary | ICD-10-CM | POA: Diagnosis not present

## 2019-06-02 DIAGNOSIS — N2581 Secondary hyperparathyroidism of renal origin: Secondary | ICD-10-CM | POA: Diagnosis not present

## 2019-06-04 DIAGNOSIS — D509 Iron deficiency anemia, unspecified: Secondary | ICD-10-CM | POA: Diagnosis not present

## 2019-06-04 DIAGNOSIS — N2581 Secondary hyperparathyroidism of renal origin: Secondary | ICD-10-CM | POA: Diagnosis not present

## 2019-06-04 DIAGNOSIS — N186 End stage renal disease: Secondary | ICD-10-CM | POA: Diagnosis not present

## 2019-06-04 DIAGNOSIS — Z992 Dependence on renal dialysis: Secondary | ICD-10-CM | POA: Diagnosis not present

## 2019-06-04 DIAGNOSIS — D631 Anemia in chronic kidney disease: Secondary | ICD-10-CM | POA: Diagnosis not present

## 2019-06-05 DIAGNOSIS — Z9181 History of falling: Secondary | ICD-10-CM | POA: Diagnosis not present

## 2019-06-05 DIAGNOSIS — N186 End stage renal disease: Secondary | ICD-10-CM | POA: Diagnosis not present

## 2019-06-05 DIAGNOSIS — Z992 Dependence on renal dialysis: Secondary | ICD-10-CM | POA: Diagnosis not present

## 2019-06-05 DIAGNOSIS — Z1331 Encounter for screening for depression: Secondary | ICD-10-CM | POA: Diagnosis not present

## 2019-06-05 DIAGNOSIS — E875 Hyperkalemia: Secondary | ICD-10-CM | POA: Diagnosis not present

## 2019-06-05 DIAGNOSIS — I442 Atrioventricular block, complete: Secondary | ICD-10-CM | POA: Diagnosis not present

## 2019-06-06 DIAGNOSIS — Z992 Dependence on renal dialysis: Secondary | ICD-10-CM | POA: Diagnosis not present

## 2019-06-06 DIAGNOSIS — D631 Anemia in chronic kidney disease: Secondary | ICD-10-CM | POA: Diagnosis not present

## 2019-06-06 DIAGNOSIS — N186 End stage renal disease: Secondary | ICD-10-CM | POA: Diagnosis not present

## 2019-06-06 DIAGNOSIS — D509 Iron deficiency anemia, unspecified: Secondary | ICD-10-CM | POA: Diagnosis not present

## 2019-06-06 DIAGNOSIS — N2581 Secondary hyperparathyroidism of renal origin: Secondary | ICD-10-CM | POA: Diagnosis not present

## 2019-06-09 DIAGNOSIS — D509 Iron deficiency anemia, unspecified: Secondary | ICD-10-CM | POA: Diagnosis not present

## 2019-06-09 DIAGNOSIS — D631 Anemia in chronic kidney disease: Secondary | ICD-10-CM | POA: Diagnosis not present

## 2019-06-09 DIAGNOSIS — N2581 Secondary hyperparathyroidism of renal origin: Secondary | ICD-10-CM | POA: Diagnosis not present

## 2019-06-09 DIAGNOSIS — N186 End stage renal disease: Secondary | ICD-10-CM | POA: Diagnosis not present

## 2019-06-09 DIAGNOSIS — Z992 Dependence on renal dialysis: Secondary | ICD-10-CM | POA: Diagnosis not present

## 2019-06-10 DIAGNOSIS — E875 Hyperkalemia: Secondary | ICD-10-CM | POA: Diagnosis not present

## 2019-06-10 DIAGNOSIS — N186 End stage renal disease: Secondary | ICD-10-CM | POA: Diagnosis not present

## 2019-06-10 DIAGNOSIS — R9439 Abnormal result of other cardiovascular function study: Secondary | ICD-10-CM | POA: Diagnosis not present

## 2019-06-10 DIAGNOSIS — I120 Hypertensive chronic kidney disease with stage 5 chronic kidney disease or end stage renal disease: Secondary | ICD-10-CM | POA: Diagnosis not present

## 2019-06-10 DIAGNOSIS — I770 Arteriovenous fistula, acquired: Secondary | ICD-10-CM | POA: Diagnosis not present

## 2019-06-10 DIAGNOSIS — Z01818 Encounter for other preprocedural examination: Secondary | ICD-10-CM | POA: Diagnosis not present

## 2019-06-10 DIAGNOSIS — Z8719 Personal history of other diseases of the digestive system: Secondary | ICD-10-CM | POA: Diagnosis not present

## 2019-06-10 DIAGNOSIS — M546 Pain in thoracic spine: Secondary | ICD-10-CM | POA: Diagnosis not present

## 2019-06-10 DIAGNOSIS — Z955 Presence of coronary angioplasty implant and graft: Secondary | ICD-10-CM | POA: Diagnosis not present

## 2019-06-10 DIAGNOSIS — I2510 Atherosclerotic heart disease of native coronary artery without angina pectoris: Secondary | ICD-10-CM | POA: Diagnosis not present

## 2019-06-11 DIAGNOSIS — Z992 Dependence on renal dialysis: Secondary | ICD-10-CM | POA: Diagnosis not present

## 2019-06-11 DIAGNOSIS — D631 Anemia in chronic kidney disease: Secondary | ICD-10-CM | POA: Diagnosis not present

## 2019-06-11 DIAGNOSIS — D509 Iron deficiency anemia, unspecified: Secondary | ICD-10-CM | POA: Diagnosis not present

## 2019-06-11 DIAGNOSIS — N186 End stage renal disease: Secondary | ICD-10-CM | POA: Diagnosis not present

## 2019-06-11 DIAGNOSIS — N2581 Secondary hyperparathyroidism of renal origin: Secondary | ICD-10-CM | POA: Diagnosis not present

## 2019-06-13 DIAGNOSIS — D509 Iron deficiency anemia, unspecified: Secondary | ICD-10-CM | POA: Diagnosis not present

## 2019-06-13 DIAGNOSIS — N2581 Secondary hyperparathyroidism of renal origin: Secondary | ICD-10-CM | POA: Diagnosis not present

## 2019-06-13 DIAGNOSIS — N186 End stage renal disease: Secondary | ICD-10-CM | POA: Diagnosis not present

## 2019-06-13 DIAGNOSIS — Z992 Dependence on renal dialysis: Secondary | ICD-10-CM | POA: Diagnosis not present

## 2019-06-13 DIAGNOSIS — D631 Anemia in chronic kidney disease: Secondary | ICD-10-CM | POA: Diagnosis not present

## 2019-06-16 DIAGNOSIS — N2581 Secondary hyperparathyroidism of renal origin: Secondary | ICD-10-CM | POA: Diagnosis not present

## 2019-06-16 DIAGNOSIS — D631 Anemia in chronic kidney disease: Secondary | ICD-10-CM | POA: Diagnosis not present

## 2019-06-16 DIAGNOSIS — Z992 Dependence on renal dialysis: Secondary | ICD-10-CM | POA: Diagnosis not present

## 2019-06-16 DIAGNOSIS — D509 Iron deficiency anemia, unspecified: Secondary | ICD-10-CM | POA: Diagnosis not present

## 2019-06-16 DIAGNOSIS — N186 End stage renal disease: Secondary | ICD-10-CM | POA: Diagnosis not present

## 2019-06-18 DIAGNOSIS — N2581 Secondary hyperparathyroidism of renal origin: Secondary | ICD-10-CM | POA: Diagnosis not present

## 2019-06-18 DIAGNOSIS — Z992 Dependence on renal dialysis: Secondary | ICD-10-CM | POA: Diagnosis not present

## 2019-06-18 DIAGNOSIS — D509 Iron deficiency anemia, unspecified: Secondary | ICD-10-CM | POA: Diagnosis not present

## 2019-06-18 DIAGNOSIS — N186 End stage renal disease: Secondary | ICD-10-CM | POA: Diagnosis not present

## 2019-06-18 DIAGNOSIS — D631 Anemia in chronic kidney disease: Secondary | ICD-10-CM | POA: Diagnosis not present

## 2019-06-20 DIAGNOSIS — Z992 Dependence on renal dialysis: Secondary | ICD-10-CM | POA: Diagnosis not present

## 2019-06-20 DIAGNOSIS — D509 Iron deficiency anemia, unspecified: Secondary | ICD-10-CM | POA: Diagnosis not present

## 2019-06-20 DIAGNOSIS — D631 Anemia in chronic kidney disease: Secondary | ICD-10-CM | POA: Diagnosis not present

## 2019-06-20 DIAGNOSIS — N2581 Secondary hyperparathyroidism of renal origin: Secondary | ICD-10-CM | POA: Diagnosis not present

## 2019-06-20 DIAGNOSIS — N186 End stage renal disease: Secondary | ICD-10-CM | POA: Diagnosis not present

## 2019-06-23 DIAGNOSIS — Z992 Dependence on renal dialysis: Secondary | ICD-10-CM | POA: Diagnosis not present

## 2019-06-23 DIAGNOSIS — N2581 Secondary hyperparathyroidism of renal origin: Secondary | ICD-10-CM | POA: Diagnosis not present

## 2019-06-23 DIAGNOSIS — D631 Anemia in chronic kidney disease: Secondary | ICD-10-CM | POA: Diagnosis not present

## 2019-06-23 DIAGNOSIS — N186 End stage renal disease: Secondary | ICD-10-CM | POA: Diagnosis not present

## 2019-06-23 DIAGNOSIS — D509 Iron deficiency anemia, unspecified: Secondary | ICD-10-CM | POA: Diagnosis not present

## 2019-06-25 DIAGNOSIS — D631 Anemia in chronic kidney disease: Secondary | ICD-10-CM | POA: Diagnosis not present

## 2019-06-25 DIAGNOSIS — N2581 Secondary hyperparathyroidism of renal origin: Secondary | ICD-10-CM | POA: Diagnosis not present

## 2019-06-25 DIAGNOSIS — D509 Iron deficiency anemia, unspecified: Secondary | ICD-10-CM | POA: Diagnosis not present

## 2019-06-25 DIAGNOSIS — Z992 Dependence on renal dialysis: Secondary | ICD-10-CM | POA: Diagnosis not present

## 2019-06-25 DIAGNOSIS — N186 End stage renal disease: Secondary | ICD-10-CM | POA: Diagnosis not present

## 2019-06-26 DIAGNOSIS — S81812A Laceration without foreign body, left lower leg, initial encounter: Secondary | ICD-10-CM | POA: Diagnosis not present

## 2019-06-26 DIAGNOSIS — J340 Abscess, furuncle and carbuncle of nose: Secondary | ICD-10-CM | POA: Diagnosis not present

## 2019-06-26 DIAGNOSIS — N186 End stage renal disease: Secondary | ICD-10-CM | POA: Diagnosis not present

## 2019-06-26 DIAGNOSIS — Z992 Dependence on renal dialysis: Secondary | ICD-10-CM | POA: Diagnosis not present

## 2019-06-27 DIAGNOSIS — D631 Anemia in chronic kidney disease: Secondary | ICD-10-CM | POA: Diagnosis not present

## 2019-06-27 DIAGNOSIS — D509 Iron deficiency anemia, unspecified: Secondary | ICD-10-CM | POA: Diagnosis not present

## 2019-06-27 DIAGNOSIS — N2581 Secondary hyperparathyroidism of renal origin: Secondary | ICD-10-CM | POA: Diagnosis not present

## 2019-06-27 DIAGNOSIS — Z992 Dependence on renal dialysis: Secondary | ICD-10-CM | POA: Diagnosis not present

## 2019-06-27 DIAGNOSIS — N186 End stage renal disease: Secondary | ICD-10-CM | POA: Diagnosis not present

## 2019-06-29 DIAGNOSIS — Z992 Dependence on renal dialysis: Secondary | ICD-10-CM | POA: Diagnosis not present

## 2019-06-29 DIAGNOSIS — N186 End stage renal disease: Secondary | ICD-10-CM | POA: Diagnosis not present

## 2019-06-29 DIAGNOSIS — I120 Hypertensive chronic kidney disease with stage 5 chronic kidney disease or end stage renal disease: Secondary | ICD-10-CM | POA: Diagnosis not present

## 2019-06-30 DIAGNOSIS — N186 End stage renal disease: Secondary | ICD-10-CM | POA: Diagnosis not present

## 2019-06-30 DIAGNOSIS — D509 Iron deficiency anemia, unspecified: Secondary | ICD-10-CM | POA: Diagnosis not present

## 2019-06-30 DIAGNOSIS — Z992 Dependence on renal dialysis: Secondary | ICD-10-CM | POA: Diagnosis not present

## 2019-06-30 DIAGNOSIS — D631 Anemia in chronic kidney disease: Secondary | ICD-10-CM | POA: Diagnosis not present

## 2019-06-30 DIAGNOSIS — N2581 Secondary hyperparathyroidism of renal origin: Secondary | ICD-10-CM | POA: Diagnosis not present

## 2019-07-01 DIAGNOSIS — J340 Abscess, furuncle and carbuncle of nose: Secondary | ICD-10-CM | POA: Diagnosis not present

## 2019-07-01 DIAGNOSIS — Z6828 Body mass index (BMI) 28.0-28.9, adult: Secondary | ICD-10-CM | POA: Diagnosis not present

## 2019-07-02 DIAGNOSIS — N186 End stage renal disease: Secondary | ICD-10-CM | POA: Diagnosis not present

## 2019-07-02 DIAGNOSIS — N2581 Secondary hyperparathyroidism of renal origin: Secondary | ICD-10-CM | POA: Diagnosis not present

## 2019-07-02 DIAGNOSIS — D509 Iron deficiency anemia, unspecified: Secondary | ICD-10-CM | POA: Diagnosis not present

## 2019-07-02 DIAGNOSIS — Z992 Dependence on renal dialysis: Secondary | ICD-10-CM | POA: Diagnosis not present

## 2019-07-02 DIAGNOSIS — D631 Anemia in chronic kidney disease: Secondary | ICD-10-CM | POA: Diagnosis not present

## 2019-07-04 DIAGNOSIS — N186 End stage renal disease: Secondary | ICD-10-CM | POA: Diagnosis not present

## 2019-07-04 DIAGNOSIS — D509 Iron deficiency anemia, unspecified: Secondary | ICD-10-CM | POA: Diagnosis not present

## 2019-07-04 DIAGNOSIS — Z992 Dependence on renal dialysis: Secondary | ICD-10-CM | POA: Diagnosis not present

## 2019-07-04 DIAGNOSIS — D631 Anemia in chronic kidney disease: Secondary | ICD-10-CM | POA: Diagnosis not present

## 2019-07-04 DIAGNOSIS — N2581 Secondary hyperparathyroidism of renal origin: Secondary | ICD-10-CM | POA: Diagnosis not present

## 2019-07-07 DIAGNOSIS — Z992 Dependence on renal dialysis: Secondary | ICD-10-CM | POA: Diagnosis not present

## 2019-07-07 DIAGNOSIS — D509 Iron deficiency anemia, unspecified: Secondary | ICD-10-CM | POA: Diagnosis not present

## 2019-07-07 DIAGNOSIS — D631 Anemia in chronic kidney disease: Secondary | ICD-10-CM | POA: Diagnosis not present

## 2019-07-07 DIAGNOSIS — N2581 Secondary hyperparathyroidism of renal origin: Secondary | ICD-10-CM | POA: Diagnosis not present

## 2019-07-07 DIAGNOSIS — N186 End stage renal disease: Secondary | ICD-10-CM | POA: Diagnosis not present

## 2019-07-09 DIAGNOSIS — D631 Anemia in chronic kidney disease: Secondary | ICD-10-CM | POA: Diagnosis not present

## 2019-07-09 DIAGNOSIS — D509 Iron deficiency anemia, unspecified: Secondary | ICD-10-CM | POA: Diagnosis not present

## 2019-07-09 DIAGNOSIS — N186 End stage renal disease: Secondary | ICD-10-CM | POA: Diagnosis not present

## 2019-07-09 DIAGNOSIS — Z992 Dependence on renal dialysis: Secondary | ICD-10-CM | POA: Diagnosis not present

## 2019-07-09 DIAGNOSIS — N2581 Secondary hyperparathyroidism of renal origin: Secondary | ICD-10-CM | POA: Diagnosis not present

## 2019-07-11 DIAGNOSIS — Z992 Dependence on renal dialysis: Secondary | ICD-10-CM | POA: Diagnosis not present

## 2019-07-11 DIAGNOSIS — D631 Anemia in chronic kidney disease: Secondary | ICD-10-CM | POA: Diagnosis not present

## 2019-07-11 DIAGNOSIS — N2581 Secondary hyperparathyroidism of renal origin: Secondary | ICD-10-CM | POA: Diagnosis not present

## 2019-07-11 DIAGNOSIS — D509 Iron deficiency anemia, unspecified: Secondary | ICD-10-CM | POA: Diagnosis not present

## 2019-07-11 DIAGNOSIS — N186 End stage renal disease: Secondary | ICD-10-CM | POA: Diagnosis not present

## 2019-07-14 DIAGNOSIS — Z992 Dependence on renal dialysis: Secondary | ICD-10-CM | POA: Diagnosis not present

## 2019-07-14 DIAGNOSIS — D631 Anemia in chronic kidney disease: Secondary | ICD-10-CM | POA: Diagnosis not present

## 2019-07-14 DIAGNOSIS — D509 Iron deficiency anemia, unspecified: Secondary | ICD-10-CM | POA: Diagnosis not present

## 2019-07-14 DIAGNOSIS — N186 End stage renal disease: Secondary | ICD-10-CM | POA: Diagnosis not present

## 2019-07-14 DIAGNOSIS — N2581 Secondary hyperparathyroidism of renal origin: Secondary | ICD-10-CM | POA: Diagnosis not present

## 2019-07-15 DIAGNOSIS — I110 Hypertensive heart disease with heart failure: Secondary | ICD-10-CM | POA: Diagnosis not present

## 2019-07-15 DIAGNOSIS — I5022 Chronic systolic (congestive) heart failure: Secondary | ICD-10-CM | POA: Diagnosis not present

## 2019-07-15 DIAGNOSIS — Z01810 Encounter for preprocedural cardiovascular examination: Secondary | ICD-10-CM | POA: Diagnosis not present

## 2019-07-15 DIAGNOSIS — I25119 Atherosclerotic heart disease of native coronary artery with unspecified angina pectoris: Secondary | ICD-10-CM | POA: Diagnosis not present

## 2019-07-16 DIAGNOSIS — N186 End stage renal disease: Secondary | ICD-10-CM | POA: Diagnosis not present

## 2019-07-16 DIAGNOSIS — Z992 Dependence on renal dialysis: Secondary | ICD-10-CM | POA: Diagnosis not present

## 2019-07-16 DIAGNOSIS — N2581 Secondary hyperparathyroidism of renal origin: Secondary | ICD-10-CM | POA: Diagnosis not present

## 2019-07-16 DIAGNOSIS — D509 Iron deficiency anemia, unspecified: Secondary | ICD-10-CM | POA: Diagnosis not present

## 2019-07-16 DIAGNOSIS — D631 Anemia in chronic kidney disease: Secondary | ICD-10-CM | POA: Diagnosis not present

## 2019-07-17 DIAGNOSIS — Z87891 Personal history of nicotine dependence: Secondary | ICD-10-CM | POA: Diagnosis not present

## 2019-07-17 DIAGNOSIS — Z7682 Awaiting organ transplant status: Secondary | ICD-10-CM | POA: Diagnosis not present

## 2019-07-17 DIAGNOSIS — I4510 Unspecified right bundle-branch block: Secondary | ICD-10-CM | POA: Diagnosis not present

## 2019-07-17 DIAGNOSIS — I350 Nonrheumatic aortic (valve) stenosis: Secondary | ICD-10-CM | POA: Diagnosis not present

## 2019-07-17 DIAGNOSIS — I2510 Atherosclerotic heart disease of native coronary artery without angina pectoris: Secondary | ICD-10-CM | POA: Diagnosis not present

## 2019-07-17 DIAGNOSIS — I517 Cardiomegaly: Secondary | ICD-10-CM | POA: Diagnosis not present

## 2019-07-17 DIAGNOSIS — Z955 Presence of coronary angioplasty implant and graft: Secondary | ICD-10-CM | POA: Diagnosis not present

## 2019-07-17 DIAGNOSIS — I429 Cardiomyopathy, unspecified: Secondary | ICD-10-CM | POA: Diagnosis not present

## 2019-07-18 DIAGNOSIS — N186 End stage renal disease: Secondary | ICD-10-CM | POA: Diagnosis not present

## 2019-07-18 DIAGNOSIS — N2581 Secondary hyperparathyroidism of renal origin: Secondary | ICD-10-CM | POA: Diagnosis not present

## 2019-07-18 DIAGNOSIS — D631 Anemia in chronic kidney disease: Secondary | ICD-10-CM | POA: Diagnosis not present

## 2019-07-18 DIAGNOSIS — Z992 Dependence on renal dialysis: Secondary | ICD-10-CM | POA: Diagnosis not present

## 2019-07-18 DIAGNOSIS — D509 Iron deficiency anemia, unspecified: Secondary | ICD-10-CM | POA: Diagnosis not present

## 2019-07-20 DIAGNOSIS — N2581 Secondary hyperparathyroidism of renal origin: Secondary | ICD-10-CM | POA: Diagnosis not present

## 2019-07-20 DIAGNOSIS — D509 Iron deficiency anemia, unspecified: Secondary | ICD-10-CM | POA: Diagnosis not present

## 2019-07-20 DIAGNOSIS — D631 Anemia in chronic kidney disease: Secondary | ICD-10-CM | POA: Diagnosis not present

## 2019-07-20 DIAGNOSIS — N186 End stage renal disease: Secondary | ICD-10-CM | POA: Diagnosis not present

## 2019-07-20 DIAGNOSIS — Z992 Dependence on renal dialysis: Secondary | ICD-10-CM | POA: Diagnosis not present

## 2019-07-22 DIAGNOSIS — D631 Anemia in chronic kidney disease: Secondary | ICD-10-CM | POA: Diagnosis not present

## 2019-07-22 DIAGNOSIS — N2581 Secondary hyperparathyroidism of renal origin: Secondary | ICD-10-CM | POA: Diagnosis not present

## 2019-07-22 DIAGNOSIS — Z992 Dependence on renal dialysis: Secondary | ICD-10-CM | POA: Diagnosis not present

## 2019-07-22 DIAGNOSIS — N186 End stage renal disease: Secondary | ICD-10-CM | POA: Diagnosis not present

## 2019-07-22 DIAGNOSIS — D509 Iron deficiency anemia, unspecified: Secondary | ICD-10-CM | POA: Diagnosis not present

## 2019-07-25 DIAGNOSIS — D631 Anemia in chronic kidney disease: Secondary | ICD-10-CM | POA: Diagnosis not present

## 2019-07-25 DIAGNOSIS — Z992 Dependence on renal dialysis: Secondary | ICD-10-CM | POA: Diagnosis not present

## 2019-07-25 DIAGNOSIS — D509 Iron deficiency anemia, unspecified: Secondary | ICD-10-CM | POA: Diagnosis not present

## 2019-07-25 DIAGNOSIS — N186 End stage renal disease: Secondary | ICD-10-CM | POA: Diagnosis not present

## 2019-07-25 DIAGNOSIS — N2581 Secondary hyperparathyroidism of renal origin: Secondary | ICD-10-CM | POA: Diagnosis not present

## 2019-07-28 DIAGNOSIS — D631 Anemia in chronic kidney disease: Secondary | ICD-10-CM | POA: Diagnosis not present

## 2019-07-28 DIAGNOSIS — Z992 Dependence on renal dialysis: Secondary | ICD-10-CM | POA: Diagnosis not present

## 2019-07-28 DIAGNOSIS — D509 Iron deficiency anemia, unspecified: Secondary | ICD-10-CM | POA: Diagnosis not present

## 2019-07-28 DIAGNOSIS — N2581 Secondary hyperparathyroidism of renal origin: Secondary | ICD-10-CM | POA: Diagnosis not present

## 2019-07-28 DIAGNOSIS — N186 End stage renal disease: Secondary | ICD-10-CM | POA: Diagnosis not present

## 2019-07-29 ENCOUNTER — Encounter (HOSPITAL_COMMUNITY): Payer: Self-pay | Admitting: Emergency Medicine

## 2019-07-29 ENCOUNTER — Emergency Department (HOSPITAL_COMMUNITY)
Admission: EM | Admit: 2019-07-29 | Discharge: 2019-07-29 | Disposition: A | Discharge status: Home / Self Care | Payer: Medicare Other | Attending: Emergency Medicine | Admitting: Emergency Medicine

## 2019-07-29 ENCOUNTER — Other Ambulatory Visit: Payer: Self-pay

## 2019-07-29 DIAGNOSIS — H02846 Edema of left eye, unspecified eyelid: Secondary | ICD-10-CM | POA: Insufficient documentation

## 2019-07-29 DIAGNOSIS — Z7982 Long term (current) use of aspirin: Secondary | ICD-10-CM | POA: Diagnosis not present

## 2019-07-29 DIAGNOSIS — I871 Compression of vein: Secondary | ICD-10-CM | POA: Diagnosis not present

## 2019-07-29 DIAGNOSIS — R220 Localized swelling, mass and lump, head: Secondary | ICD-10-CM | POA: Diagnosis present

## 2019-07-29 DIAGNOSIS — F17210 Nicotine dependence, cigarettes, uncomplicated: Secondary | ICD-10-CM | POA: Insufficient documentation

## 2019-07-29 DIAGNOSIS — L0201 Cutaneous abscess of face: Secondary | ICD-10-CM | POA: Insufficient documentation

## 2019-07-29 DIAGNOSIS — Z992 Dependence on renal dialysis: Secondary | ICD-10-CM | POA: Diagnosis not present

## 2019-07-29 DIAGNOSIS — I120 Hypertensive chronic kidney disease with stage 5 chronic kidney disease or end stage renal disease: Secondary | ICD-10-CM | POA: Diagnosis not present

## 2019-07-29 DIAGNOSIS — Z79899 Other long term (current) drug therapy: Secondary | ICD-10-CM | POA: Diagnosis not present

## 2019-07-29 DIAGNOSIS — N186 End stage renal disease: Secondary | ICD-10-CM | POA: Insufficient documentation

## 2019-07-29 DIAGNOSIS — I2510 Atherosclerotic heart disease of native coronary artery without angina pectoris: Secondary | ICD-10-CM | POA: Insufficient documentation

## 2019-07-29 MED ORDER — CLINDAMYCIN HCL 300 MG PO CAPS: 300.0000 mg | ORAL_CAPSULE | Freq: Four times a day (QID) | ORAL | 0 refills | Status: DC

## 2019-07-29 MED ORDER — LIDOCAINE HCL (PF) 1 % IJ SOLN
5.0000 mL | Freq: Once | INTRAMUSCULAR | Status: DC
  Filled 2019-07-29: qty 5

## 2019-07-29 MED ORDER — CLINDAMYCIN PHOSPHATE 600 MG/50ML IV SOLN
600.0000 mg | Freq: Once | INTRAVENOUS | Status: AC
  Administered 2019-07-29: 600 mg via INTRAVENOUS
  Filled 2019-07-29: qty 50

## 2019-07-29 NOTE — ED Provider Notes (Addendum)
[facility] EMERGENCY DEPARTMENT Provider Note   CSN: [id number] Arrival date & time: 07/29/19  0901     History   Chief Complaint Chief Complaint  Patient presents with  . Abscess    HPI Marco Arnold is a 53 y.o. male.     Patient is a 53 year old male with extensive past medical history including end-stage renal disease on hemodialysis, coronary artery disease with multiple stents, hypertension.  He presents today for evaluation of facial swelling.  This is worsened over the past several days.  He has an area anterior to his left ear that is swollen and tender to the touch.  He also has swelling around his left eye.  He denies any eye pain, headache, fever.  The history is provided by the patient.    Past Medical History:  Diagnosis Date  . Arthritis    spine  . Chronic kidney disease    [place] MWF  . Coronary artery disease   . Dialysis patient [facility]) 2013  . Diverticulitis   . Hyperlipidemia   . Hypertension    dr  Tobie Lords      @ [facility]  med  . Neuromuscular disorder ([place])    nerve pain - tx w/cymbalta  . Neuropathic pain 09/20/2017   Left mid-thoracic    Patient Active Problem List   Diagnosis Date Noted  . Hyperkalemia 05/26/2019  . Hyponatremia 05/26/2019  . CAD (coronary artery disease) 05/26/2019  . Complete heart block, transient ([place]) 05/26/2019  . Neuropathic pain 09/20/2017  . Thoracic disc herniation 03/29/2017  . Chronic bilateral low back pain without sciatica 03/29/2017  . Other sleep disturbances 12/05/2012  . End stage renal disease ([place]) 12/05/2011  . ESRD (end stage renal disease) ([place]) 11/11/2011  . Anemia of chronic disease 11/11/2011  . Hyperparathyroidism, secondary renal ([place]) 11/11/2011  . HTN (hypertension) 11/11/2011  . Acute kidney injury ([place]) 11/06/2011    Past Surgical History:  Procedure Laterality Date  . AV FISTULA PLACEMENT  11/10/2011   Procedure: ARTERIOVENOUS (AV) FISTULA CREATION;   Surgeon: Mal Misty, MD;  Location: [place];  Service: Vascular;  Laterality: Left;  Ultrasound guided  . CARDIAC CATHETERIZATION  09/19/2018   On stent study at [facility]  . COLONOSCOPY    . INSERTION OF DIALYSIS CATHETER  02/09/2012   Procedure: INSERTION OF DIALYSIS CATHETER;  Surgeon: Mal Misty, MD;  Location: [place];  Service: Vascular;  Laterality: N/A;  . MASS EXCISION Right 09/07/2018   Procedure: EXCISION MASS RIGHT THUMB;  Surgeon: Charlotte Crumb, MD;  Location: [place];  Service: Orthopedics;  Laterality: Right;  OR AXILLARY BLOCK  . REVISON OF ARTERIOVENOUS FISTULA Left 04/08/2019   Procedure: REVISION PLICATION OF LEFT ARM ARTERIOVENOUS FISTULA;  Surgeon: Serafina Mitchell, MD;  Location: [place];  Service: Vascular;  Laterality: Left;  . SHUNTOGRAM Left 06/03/2012   Procedure: SHUNTOGRAM;  Surgeon: Conrad Rockford, MD;  Location: [facility] CATH LAB;  Service: Cardiovascular;  Laterality: Left;  . SHUNTOGRAM Left 01/02/2013   Procedure: SHUNTOGRAM;  Surgeon: Conrad Provo, MD;  Location: [facility] CATH LAB;  Service: Cardiovascular;  Laterality: Left;        Home Medications    Prior to Admission medications   Medication Sig Start Date End Date Taking? Authorizing Provider  amLODipine (NORVASC) 10 MG tablet Take 10 mg by mouth at bedtime.     [provider]  aspirin EC 81 MG tablet Take 81 mg by mouth daily.  [provider]  atorvastatin (LIPITOR) 80 MG tablet Take 80 mg by mouth daily.  01/16/19   [provider]  carvedilol (COREG) 3.125 MG tablet Take 3.125 mg by mouth 2 (two) times daily with a meal.  01/16/19   [provider]  cloNIDine (CATAPRES) 0.1 MG tablet Take 0.2 mg by mouth daily. 07/13/16   [provider]  cyclobenzaprine (FLEXERIL) 10 MG tablet Take 20 mg by mouth 2 (two) times daily as needed for muscle spasms.     [provider]  DULoxetine (CYMBALTA) 60 MG capsule Take 60 mg by mouth every morning.  01/29/18   [provider]  FOSRENOL 1000 MG chewable tablet Chew 6,000 mg by mouth 3 (three) times daily with meals.  08/31/17   [provider]  gabapentin (NEURONTIN) 100 MG capsule Take 3 capsules (300 mg total) by mouth at bedtime. 09/20/17   Kathrynn Ducking, MD  HYDROcodone-acetaminophen (NORCO) 5-325 MG tablet Take 1 tablet by mouth every 6 (six) hours as needed for moderate pain. Patient not taking: Reported on 05/29/2019 04/08/19   Dagoberto Ligas, PA-C  hydrOXYzine (ATARAX/VISTARIL) 25 MG tablet Take 25 mg by mouth 2 (two) times daily as needed for itching (Sleep).     [provider]  multivitamin (RENA-VIT) TABS tablet Take 1 tablet by mouth See admin instructions. Every Monday, Wednesday and Friday    [provider]  naproxen sodium (ALEVE) 220 MG tablet Take 220 mg by mouth 2 (two) times daily as needed (pain).     [provider]  nortriptyline (PAMELOR) 25 MG capsule Take 25 mg by mouth every morning.  01/29/18   [provider]  pantoprazole (PROTONIX) 40 MG tablet Take 40 mg by mouth daily.  12/04/12   [provider]  VENTOLIN HFA 108 (90 Base) MCG/ACT inhaler Inhale 2 puffs into the lungs every 6 (six) hours as needed for wheezing or shortness of breath.  12/21/17   [provider]    Family History Family History  Problem Relation Age of Onset  . Pancreatic cancer Mother   . Diabetes Father   . Hypertension Father   . Cancer - Lung Paternal Grandfather     Social History Social History   Tobacco Use  . Smoking status: Light Tobacco Smoker    Packs/day: 0.25    Years: 43.00    Pack years: 10.75    Types: Cigarettes  . Smokeless tobacco: Never Used  . Tobacco comment: pt states that he is trying to quit--is trying to cut back at the current time  Substance Use Topics  . Alcohol use: Yes    Comment: occasional wine  . Drug use: Yes    Types: Marijuana    Comment: Last dose Friday 04/04/19     Allergies    Lisinopril   Review of Systems Review of Systems  All other systems reviewed and are negative.    Physical Exam Updated Vital Signs BP (!) 143/93   Pulse 78   Temp (!) 97.4 F (36.3 C) (Oral)   Resp 16   SpO2 100%   Physical Exam Vitals signs and nursing note reviewed.  Constitutional:      General: He is not in acute distress.    Appearance: Normal appearance. He is not ill-appearing.  HENT:     Head: Normocephalic and atraumatic.     Comments: Just anterior and superior to the left ear is an area of warmth, swelling, erythema, and fluctuance measuring  approximately 3 cm x 3 cm. Eyes:     Extraocular Movements: Extraocular movements intact.     Pupils: Pupils are equal, round, and reactive to light.     Comments: There is also mild periorbital erythema, but no fluctuance.  He has full range of motion of the extraorbital muscles with no discomfort.  Neurological:     Mental Status: He is alert.      ED Treatments / Results  Labs (all labs ordered are listed, but only abnormal results are displayed) Labs Reviewed - No data to display  EKG None  Radiology No results found.  Procedures Procedures (including critical care time)  Medications Ordered in ED Medications  clindamycin (CLEOCIN) IVPB 600 mg (has no administration in time range)  lidocaine (PF) (XYLOCAINE) 1 % injection 5 mL (has no administration in time range)     Initial Impression / Assessment and Plan / ED Course  I have reviewed the triage vital signs and the nursing notes.  Pertinent labs & imaging results that were available during my care of the patient were reviewed by me and considered in my medical decision making (see chart for details).  Patient presenting here with complaints of pain and swelling to the left side of his face just anterior and superior to his left ear.  There is also some erythema and swelling to the left eyelid and periorbital soft tissues.  Patient has full range of  motion of the extraorbital muscles without pain and I highly doubt an orbital cellulitis.  This appears to be a periorbital cellulitis.    The area to the face adjacent to the left ear is swollen and tender to the touch with fluctuance.  The area was incised and drained with a small amount of purulent and sebaceous material was expressed.  Patient given IV antibiotics and will be discharged with oral clindamycin.  He is to apply warm compresses as frequently as possible and follow-up as needed for any problems.  INCISION AND DRAINAGE Performed by: Veryl Speak Consent: Verbal consent obtained. Risks and benefits: risks, benefits and alternatives were discussed Type: abscess  Body area: left face  Anesthesia: local infiltration  Incision was made with a scalpel.  Local anesthetic: lidocaine 1% without epinephrine  Anesthetic total: 1 ml  Complexity: complex Blunt dissection to break up loculations  Drainage: purulent, sebaceous  Drainage amount: small  Packing material: no packing placed  Patient tolerance: Patient tolerated the procedure well with no immediate complications.     Final Clinical Impressions(s) / ED Diagnoses   Final diagnoses:  None    ED Discharge Orders    None       Veryl Speak, MD 07/29/19 1100    Veryl Speak, MD 07/29/19 1102

## 2019-07-29 NOTE — Discharge Instructions (Addendum)
Begin taking clindamycin as prescribed.  Apply warm compresses as frequently as possible for the next several days.  Return to the emergency department for increased redness, swelling, pain, fever, or other new and concerning symptoms.

## 2019-07-29 NOTE — ED Triage Notes (Signed)
Patient c/o facial abscess to left of head onset of Saturday and last night another formed to right forehead. Patient was given IV antibiotics during dialysis treatment yesterday and had full treatment. M/W/F dialysis patient.

## 2019-08-29 DIAGNOSIS — N186 End stage renal disease: Secondary | ICD-10-CM | POA: Diagnosis not present

## 2019-08-29 DIAGNOSIS — I120 Hypertensive chronic kidney disease with stage 5 chronic kidney disease or end stage renal disease: Secondary | ICD-10-CM | POA: Diagnosis not present

## 2019-08-29 DIAGNOSIS — Z992 Dependence on renal dialysis: Secondary | ICD-10-CM | POA: Diagnosis not present

## 2019-08-30 DIAGNOSIS — Z992 Dependence on renal dialysis: Secondary | ICD-10-CM | POA: Diagnosis not present

## 2019-08-30 DIAGNOSIS — D509 Iron deficiency anemia, unspecified: Secondary | ICD-10-CM | POA: Diagnosis not present

## 2019-08-30 DIAGNOSIS — N2581 Secondary hyperparathyroidism of renal origin: Secondary | ICD-10-CM | POA: Diagnosis not present

## 2019-08-30 DIAGNOSIS — N186 End stage renal disease: Secondary | ICD-10-CM | POA: Diagnosis not present

## 2019-08-30 DIAGNOSIS — D631 Anemia in chronic kidney disease: Secondary | ICD-10-CM | POA: Diagnosis not present

## 2019-09-01 DIAGNOSIS — D509 Iron deficiency anemia, unspecified: Secondary | ICD-10-CM | POA: Diagnosis not present

## 2019-09-01 DIAGNOSIS — Z992 Dependence on renal dialysis: Secondary | ICD-10-CM | POA: Diagnosis not present

## 2019-09-01 DIAGNOSIS — N186 End stage renal disease: Secondary | ICD-10-CM | POA: Diagnosis not present

## 2019-09-01 DIAGNOSIS — D631 Anemia in chronic kidney disease: Secondary | ICD-10-CM | POA: Diagnosis not present

## 2019-09-01 DIAGNOSIS — N2581 Secondary hyperparathyroidism of renal origin: Secondary | ICD-10-CM | POA: Diagnosis not present

## 2019-09-03 DIAGNOSIS — D509 Iron deficiency anemia, unspecified: Secondary | ICD-10-CM | POA: Diagnosis not present

## 2019-09-03 DIAGNOSIS — N186 End stage renal disease: Secondary | ICD-10-CM | POA: Diagnosis not present

## 2019-09-03 DIAGNOSIS — D631 Anemia in chronic kidney disease: Secondary | ICD-10-CM | POA: Diagnosis not present

## 2019-09-03 DIAGNOSIS — Z992 Dependence on renal dialysis: Secondary | ICD-10-CM | POA: Diagnosis not present

## 2019-09-03 DIAGNOSIS — N2581 Secondary hyperparathyroidism of renal origin: Secondary | ICD-10-CM | POA: Diagnosis not present

## 2019-09-05 DIAGNOSIS — Z992 Dependence on renal dialysis: Secondary | ICD-10-CM | POA: Diagnosis not present

## 2019-09-05 DIAGNOSIS — D509 Iron deficiency anemia, unspecified: Secondary | ICD-10-CM | POA: Diagnosis not present

## 2019-09-05 DIAGNOSIS — N186 End stage renal disease: Secondary | ICD-10-CM | POA: Diagnosis not present

## 2019-09-05 DIAGNOSIS — D631 Anemia in chronic kidney disease: Secondary | ICD-10-CM | POA: Diagnosis not present

## 2019-09-05 DIAGNOSIS — N2581 Secondary hyperparathyroidism of renal origin: Secondary | ICD-10-CM | POA: Diagnosis not present

## 2019-09-08 DIAGNOSIS — Z992 Dependence on renal dialysis: Secondary | ICD-10-CM | POA: Diagnosis not present

## 2019-09-08 DIAGNOSIS — N186 End stage renal disease: Secondary | ICD-10-CM | POA: Diagnosis not present

## 2019-09-08 DIAGNOSIS — D509 Iron deficiency anemia, unspecified: Secondary | ICD-10-CM | POA: Diagnosis not present

## 2019-09-08 DIAGNOSIS — N2581 Secondary hyperparathyroidism of renal origin: Secondary | ICD-10-CM | POA: Diagnosis not present

## 2019-09-08 DIAGNOSIS — D631 Anemia in chronic kidney disease: Secondary | ICD-10-CM | POA: Diagnosis not present

## 2019-09-10 DIAGNOSIS — Z992 Dependence on renal dialysis: Secondary | ICD-10-CM | POA: Diagnosis not present

## 2019-09-10 DIAGNOSIS — D509 Iron deficiency anemia, unspecified: Secondary | ICD-10-CM | POA: Diagnosis not present

## 2019-09-10 DIAGNOSIS — N186 End stage renal disease: Secondary | ICD-10-CM | POA: Diagnosis not present

## 2019-09-10 DIAGNOSIS — N2581 Secondary hyperparathyroidism of renal origin: Secondary | ICD-10-CM | POA: Diagnosis not present

## 2019-09-10 DIAGNOSIS — D631 Anemia in chronic kidney disease: Secondary | ICD-10-CM | POA: Diagnosis not present

## 2019-09-12 DIAGNOSIS — Z992 Dependence on renal dialysis: Secondary | ICD-10-CM | POA: Diagnosis not present

## 2019-09-12 DIAGNOSIS — N186 End stage renal disease: Secondary | ICD-10-CM | POA: Diagnosis not present

## 2019-09-12 DIAGNOSIS — D631 Anemia in chronic kidney disease: Secondary | ICD-10-CM | POA: Diagnosis not present

## 2019-09-12 DIAGNOSIS — N2581 Secondary hyperparathyroidism of renal origin: Secondary | ICD-10-CM | POA: Diagnosis not present

## 2019-09-12 DIAGNOSIS — D509 Iron deficiency anemia, unspecified: Secondary | ICD-10-CM | POA: Diagnosis not present

## 2019-09-15 DIAGNOSIS — Z992 Dependence on renal dialysis: Secondary | ICD-10-CM | POA: Diagnosis not present

## 2019-09-15 DIAGNOSIS — D631 Anemia in chronic kidney disease: Secondary | ICD-10-CM | POA: Diagnosis not present

## 2019-09-15 DIAGNOSIS — N186 End stage renal disease: Secondary | ICD-10-CM | POA: Diagnosis not present

## 2019-09-15 DIAGNOSIS — N2581 Secondary hyperparathyroidism of renal origin: Secondary | ICD-10-CM | POA: Diagnosis not present

## 2019-09-15 DIAGNOSIS — D509 Iron deficiency anemia, unspecified: Secondary | ICD-10-CM | POA: Diagnosis not present

## 2019-09-17 DIAGNOSIS — D631 Anemia in chronic kidney disease: Secondary | ICD-10-CM | POA: Diagnosis not present

## 2019-09-17 DIAGNOSIS — N2581 Secondary hyperparathyroidism of renal origin: Secondary | ICD-10-CM | POA: Diagnosis not present

## 2019-09-17 DIAGNOSIS — N186 End stage renal disease: Secondary | ICD-10-CM | POA: Diagnosis not present

## 2019-09-17 DIAGNOSIS — Z992 Dependence on renal dialysis: Secondary | ICD-10-CM | POA: Diagnosis not present

## 2019-09-17 DIAGNOSIS — D509 Iron deficiency anemia, unspecified: Secondary | ICD-10-CM | POA: Diagnosis not present

## 2019-09-19 DIAGNOSIS — Z992 Dependence on renal dialysis: Secondary | ICD-10-CM | POA: Diagnosis not present

## 2019-09-19 DIAGNOSIS — N186 End stage renal disease: Secondary | ICD-10-CM | POA: Diagnosis not present

## 2019-09-19 DIAGNOSIS — D509 Iron deficiency anemia, unspecified: Secondary | ICD-10-CM | POA: Diagnosis not present

## 2019-09-19 DIAGNOSIS — D631 Anemia in chronic kidney disease: Secondary | ICD-10-CM | POA: Diagnosis not present

## 2019-09-19 DIAGNOSIS — N2581 Secondary hyperparathyroidism of renal origin: Secondary | ICD-10-CM | POA: Diagnosis not present

## 2019-09-22 DIAGNOSIS — D509 Iron deficiency anemia, unspecified: Secondary | ICD-10-CM | POA: Diagnosis not present

## 2019-09-22 DIAGNOSIS — N2581 Secondary hyperparathyroidism of renal origin: Secondary | ICD-10-CM | POA: Diagnosis not present

## 2019-09-22 DIAGNOSIS — Z992 Dependence on renal dialysis: Secondary | ICD-10-CM | POA: Diagnosis not present

## 2019-09-22 DIAGNOSIS — N186 End stage renal disease: Secondary | ICD-10-CM | POA: Diagnosis not present

## 2019-09-22 DIAGNOSIS — D631 Anemia in chronic kidney disease: Secondary | ICD-10-CM | POA: Diagnosis not present

## 2019-09-24 DIAGNOSIS — N186 End stage renal disease: Secondary | ICD-10-CM | POA: Diagnosis not present

## 2019-09-24 DIAGNOSIS — Z992 Dependence on renal dialysis: Secondary | ICD-10-CM | POA: Diagnosis not present

## 2019-09-24 DIAGNOSIS — N2581 Secondary hyperparathyroidism of renal origin: Secondary | ICD-10-CM | POA: Diagnosis not present

## 2019-09-24 DIAGNOSIS — D631 Anemia in chronic kidney disease: Secondary | ICD-10-CM | POA: Diagnosis not present

## 2019-09-24 DIAGNOSIS — D509 Iron deficiency anemia, unspecified: Secondary | ICD-10-CM | POA: Diagnosis not present

## 2019-09-26 DIAGNOSIS — D509 Iron deficiency anemia, unspecified: Secondary | ICD-10-CM | POA: Diagnosis not present

## 2019-09-26 DIAGNOSIS — N2581 Secondary hyperparathyroidism of renal origin: Secondary | ICD-10-CM | POA: Diagnosis not present

## 2019-09-26 DIAGNOSIS — D631 Anemia in chronic kidney disease: Secondary | ICD-10-CM | POA: Diagnosis not present

## 2019-09-26 DIAGNOSIS — N186 End stage renal disease: Secondary | ICD-10-CM | POA: Diagnosis not present

## 2019-09-26 DIAGNOSIS — Z992 Dependence on renal dialysis: Secondary | ICD-10-CM | POA: Diagnosis not present

## 2019-09-29 DIAGNOSIS — Z23 Encounter for immunization: Secondary | ICD-10-CM | POA: Diagnosis not present

## 2019-09-29 DIAGNOSIS — Z992 Dependence on renal dialysis: Secondary | ICD-10-CM | POA: Diagnosis not present

## 2019-09-29 DIAGNOSIS — D631 Anemia in chronic kidney disease: Secondary | ICD-10-CM | POA: Diagnosis not present

## 2019-09-29 DIAGNOSIS — N2581 Secondary hyperparathyroidism of renal origin: Secondary | ICD-10-CM | POA: Diagnosis not present

## 2019-09-29 DIAGNOSIS — D509 Iron deficiency anemia, unspecified: Secondary | ICD-10-CM | POA: Diagnosis not present

## 2019-09-29 DIAGNOSIS — I120 Hypertensive chronic kidney disease with stage 5 chronic kidney disease or end stage renal disease: Secondary | ICD-10-CM | POA: Diagnosis not present

## 2019-09-29 DIAGNOSIS — N186 End stage renal disease: Secondary | ICD-10-CM | POA: Diagnosis not present

## 2019-10-01 DIAGNOSIS — N2581 Secondary hyperparathyroidism of renal origin: Secondary | ICD-10-CM | POA: Diagnosis not present

## 2019-10-01 DIAGNOSIS — D509 Iron deficiency anemia, unspecified: Secondary | ICD-10-CM | POA: Diagnosis not present

## 2019-10-01 DIAGNOSIS — D631 Anemia in chronic kidney disease: Secondary | ICD-10-CM | POA: Diagnosis not present

## 2019-10-01 DIAGNOSIS — N186 End stage renal disease: Secondary | ICD-10-CM | POA: Diagnosis not present

## 2019-10-01 DIAGNOSIS — Z992 Dependence on renal dialysis: Secondary | ICD-10-CM | POA: Diagnosis not present

## 2019-10-01 DIAGNOSIS — Z23 Encounter for immunization: Secondary | ICD-10-CM | POA: Diagnosis not present

## 2019-10-03 DIAGNOSIS — N186 End stage renal disease: Secondary | ICD-10-CM | POA: Diagnosis not present

## 2019-10-03 DIAGNOSIS — Z992 Dependence on renal dialysis: Secondary | ICD-10-CM | POA: Diagnosis not present

## 2019-10-03 DIAGNOSIS — D631 Anemia in chronic kidney disease: Secondary | ICD-10-CM | POA: Diagnosis not present

## 2019-10-03 DIAGNOSIS — N2581 Secondary hyperparathyroidism of renal origin: Secondary | ICD-10-CM | POA: Diagnosis not present

## 2019-10-03 DIAGNOSIS — Z23 Encounter for immunization: Secondary | ICD-10-CM | POA: Diagnosis not present

## 2019-10-03 DIAGNOSIS — D509 Iron deficiency anemia, unspecified: Secondary | ICD-10-CM | POA: Diagnosis not present

## 2019-10-06 DIAGNOSIS — Z992 Dependence on renal dialysis: Secondary | ICD-10-CM | POA: Diagnosis not present

## 2019-10-06 DIAGNOSIS — Z23 Encounter for immunization: Secondary | ICD-10-CM | POA: Diagnosis not present

## 2019-10-06 DIAGNOSIS — D509 Iron deficiency anemia, unspecified: Secondary | ICD-10-CM | POA: Diagnosis not present

## 2019-10-06 DIAGNOSIS — D631 Anemia in chronic kidney disease: Secondary | ICD-10-CM | POA: Diagnosis not present

## 2019-10-06 DIAGNOSIS — N186 End stage renal disease: Secondary | ICD-10-CM | POA: Diagnosis not present

## 2019-10-06 DIAGNOSIS — N2581 Secondary hyperparathyroidism of renal origin: Secondary | ICD-10-CM | POA: Diagnosis not present

## 2019-10-08 DIAGNOSIS — N2581 Secondary hyperparathyroidism of renal origin: Secondary | ICD-10-CM | POA: Diagnosis not present

## 2019-10-08 DIAGNOSIS — N186 End stage renal disease: Secondary | ICD-10-CM | POA: Diagnosis not present

## 2019-10-08 DIAGNOSIS — Z23 Encounter for immunization: Secondary | ICD-10-CM | POA: Diagnosis not present

## 2019-10-08 DIAGNOSIS — D509 Iron deficiency anemia, unspecified: Secondary | ICD-10-CM | POA: Diagnosis not present

## 2019-10-08 DIAGNOSIS — D631 Anemia in chronic kidney disease: Secondary | ICD-10-CM | POA: Diagnosis not present

## 2019-10-08 DIAGNOSIS — Z992 Dependence on renal dialysis: Secondary | ICD-10-CM | POA: Diagnosis not present

## 2019-10-10 DIAGNOSIS — Z23 Encounter for immunization: Secondary | ICD-10-CM | POA: Diagnosis not present

## 2019-10-10 DIAGNOSIS — Z992 Dependence on renal dialysis: Secondary | ICD-10-CM | POA: Diagnosis not present

## 2019-10-10 DIAGNOSIS — D509 Iron deficiency anemia, unspecified: Secondary | ICD-10-CM | POA: Diagnosis not present

## 2019-10-10 DIAGNOSIS — N2581 Secondary hyperparathyroidism of renal origin: Secondary | ICD-10-CM | POA: Diagnosis not present

## 2019-10-10 DIAGNOSIS — N186 End stage renal disease: Secondary | ICD-10-CM | POA: Diagnosis not present

## 2019-10-10 DIAGNOSIS — D631 Anemia in chronic kidney disease: Secondary | ICD-10-CM | POA: Diagnosis not present

## 2019-10-13 DIAGNOSIS — D509 Iron deficiency anemia, unspecified: Secondary | ICD-10-CM | POA: Diagnosis not present

## 2019-10-13 DIAGNOSIS — N186 End stage renal disease: Secondary | ICD-10-CM | POA: Diagnosis not present

## 2019-10-13 DIAGNOSIS — D631 Anemia in chronic kidney disease: Secondary | ICD-10-CM | POA: Diagnosis not present

## 2019-10-13 DIAGNOSIS — N2581 Secondary hyperparathyroidism of renal origin: Secondary | ICD-10-CM | POA: Diagnosis not present

## 2019-10-13 DIAGNOSIS — Z992 Dependence on renal dialysis: Secondary | ICD-10-CM | POA: Diagnosis not present

## 2019-10-13 DIAGNOSIS — Z23 Encounter for immunization: Secondary | ICD-10-CM | POA: Diagnosis not present

## 2019-10-15 DIAGNOSIS — N2581 Secondary hyperparathyroidism of renal origin: Secondary | ICD-10-CM | POA: Diagnosis not present

## 2019-10-15 DIAGNOSIS — Z992 Dependence on renal dialysis: Secondary | ICD-10-CM | POA: Diagnosis not present

## 2019-10-15 DIAGNOSIS — N186 End stage renal disease: Secondary | ICD-10-CM | POA: Diagnosis not present

## 2019-10-15 DIAGNOSIS — D509 Iron deficiency anemia, unspecified: Secondary | ICD-10-CM | POA: Diagnosis not present

## 2019-10-15 DIAGNOSIS — Z23 Encounter for immunization: Secondary | ICD-10-CM | POA: Diagnosis not present

## 2019-10-15 DIAGNOSIS — D631 Anemia in chronic kidney disease: Secondary | ICD-10-CM | POA: Diagnosis not present

## 2019-10-17 DIAGNOSIS — N2581 Secondary hyperparathyroidism of renal origin: Secondary | ICD-10-CM | POA: Diagnosis not present

## 2019-10-17 DIAGNOSIS — N186 End stage renal disease: Secondary | ICD-10-CM | POA: Diagnosis not present

## 2019-10-17 DIAGNOSIS — Z992 Dependence on renal dialysis: Secondary | ICD-10-CM | POA: Diagnosis not present

## 2019-10-17 DIAGNOSIS — Z23 Encounter for immunization: Secondary | ICD-10-CM | POA: Diagnosis not present

## 2019-10-17 DIAGNOSIS — D509 Iron deficiency anemia, unspecified: Secondary | ICD-10-CM | POA: Diagnosis not present

## 2019-10-17 DIAGNOSIS — D631 Anemia in chronic kidney disease: Secondary | ICD-10-CM | POA: Diagnosis not present

## 2019-10-20 DIAGNOSIS — Z23 Encounter for immunization: Secondary | ICD-10-CM | POA: Diagnosis not present

## 2019-10-20 DIAGNOSIS — D631 Anemia in chronic kidney disease: Secondary | ICD-10-CM | POA: Diagnosis not present

## 2019-10-20 DIAGNOSIS — N2581 Secondary hyperparathyroidism of renal origin: Secondary | ICD-10-CM | POA: Diagnosis not present

## 2019-10-20 DIAGNOSIS — Z992 Dependence on renal dialysis: Secondary | ICD-10-CM | POA: Diagnosis not present

## 2019-10-20 DIAGNOSIS — D509 Iron deficiency anemia, unspecified: Secondary | ICD-10-CM | POA: Diagnosis not present

## 2019-10-20 DIAGNOSIS — N186 End stage renal disease: Secondary | ICD-10-CM | POA: Diagnosis not present

## 2019-10-22 DIAGNOSIS — N186 End stage renal disease: Secondary | ICD-10-CM | POA: Diagnosis not present

## 2019-10-22 DIAGNOSIS — D509 Iron deficiency anemia, unspecified: Secondary | ICD-10-CM | POA: Diagnosis not present

## 2019-10-22 DIAGNOSIS — D631 Anemia in chronic kidney disease: Secondary | ICD-10-CM | POA: Diagnosis not present

## 2019-10-22 DIAGNOSIS — Z23 Encounter for immunization: Secondary | ICD-10-CM | POA: Diagnosis not present

## 2019-10-22 DIAGNOSIS — N2581 Secondary hyperparathyroidism of renal origin: Secondary | ICD-10-CM | POA: Diagnosis not present

## 2019-10-22 DIAGNOSIS — Z992 Dependence on renal dialysis: Secondary | ICD-10-CM | POA: Diagnosis not present

## 2019-10-24 DIAGNOSIS — N186 End stage renal disease: Secondary | ICD-10-CM | POA: Diagnosis not present

## 2019-10-24 DIAGNOSIS — Z992 Dependence on renal dialysis: Secondary | ICD-10-CM | POA: Diagnosis not present

## 2019-10-24 DIAGNOSIS — N2581 Secondary hyperparathyroidism of renal origin: Secondary | ICD-10-CM | POA: Diagnosis not present

## 2019-10-24 DIAGNOSIS — Z23 Encounter for immunization: Secondary | ICD-10-CM | POA: Diagnosis not present

## 2019-10-24 DIAGNOSIS — D631 Anemia in chronic kidney disease: Secondary | ICD-10-CM | POA: Diagnosis not present

## 2019-10-24 DIAGNOSIS — D509 Iron deficiency anemia, unspecified: Secondary | ICD-10-CM | POA: Diagnosis not present

## 2019-10-27 DIAGNOSIS — Z23 Encounter for immunization: Secondary | ICD-10-CM | POA: Diagnosis not present

## 2019-10-27 DIAGNOSIS — N186 End stage renal disease: Secondary | ICD-10-CM | POA: Diagnosis not present

## 2019-10-27 DIAGNOSIS — I120 Hypertensive chronic kidney disease with stage 5 chronic kidney disease or end stage renal disease: Secondary | ICD-10-CM | POA: Diagnosis not present

## 2019-10-27 DIAGNOSIS — Z992 Dependence on renal dialysis: Secondary | ICD-10-CM | POA: Diagnosis not present

## 2019-10-27 DIAGNOSIS — N2581 Secondary hyperparathyroidism of renal origin: Secondary | ICD-10-CM | POA: Diagnosis not present

## 2019-10-29 DIAGNOSIS — N186 End stage renal disease: Secondary | ICD-10-CM | POA: Diagnosis not present

## 2019-10-29 DIAGNOSIS — N2581 Secondary hyperparathyroidism of renal origin: Secondary | ICD-10-CM | POA: Diagnosis not present

## 2019-10-29 DIAGNOSIS — Z992 Dependence on renal dialysis: Secondary | ICD-10-CM | POA: Diagnosis not present

## 2019-10-29 DIAGNOSIS — Z23 Encounter for immunization: Secondary | ICD-10-CM | POA: Diagnosis not present

## 2019-10-31 DIAGNOSIS — N2581 Secondary hyperparathyroidism of renal origin: Secondary | ICD-10-CM | POA: Diagnosis not present

## 2019-10-31 DIAGNOSIS — N186 End stage renal disease: Secondary | ICD-10-CM | POA: Diagnosis not present

## 2019-10-31 DIAGNOSIS — Z992 Dependence on renal dialysis: Secondary | ICD-10-CM | POA: Diagnosis not present

## 2019-10-31 DIAGNOSIS — Z23 Encounter for immunization: Secondary | ICD-10-CM | POA: Diagnosis not present

## 2019-11-03 DIAGNOSIS — Z23 Encounter for immunization: Secondary | ICD-10-CM | POA: Diagnosis not present

## 2019-11-03 DIAGNOSIS — N2581 Secondary hyperparathyroidism of renal origin: Secondary | ICD-10-CM | POA: Diagnosis not present

## 2019-11-03 DIAGNOSIS — N186 End stage renal disease: Secondary | ICD-10-CM | POA: Diagnosis not present

## 2019-11-03 DIAGNOSIS — Z992 Dependence on renal dialysis: Secondary | ICD-10-CM | POA: Diagnosis not present

## 2019-11-05 DIAGNOSIS — N186 End stage renal disease: Secondary | ICD-10-CM | POA: Diagnosis not present

## 2019-11-05 DIAGNOSIS — Z23 Encounter for immunization: Secondary | ICD-10-CM | POA: Diagnosis not present

## 2019-11-05 DIAGNOSIS — Z992 Dependence on renal dialysis: Secondary | ICD-10-CM | POA: Diagnosis not present

## 2019-11-05 DIAGNOSIS — N2581 Secondary hyperparathyroidism of renal origin: Secondary | ICD-10-CM | POA: Diagnosis not present

## 2019-11-07 DIAGNOSIS — Z23 Encounter for immunization: Secondary | ICD-10-CM | POA: Diagnosis not present

## 2019-11-07 DIAGNOSIS — Z992 Dependence on renal dialysis: Secondary | ICD-10-CM | POA: Diagnosis not present

## 2019-11-07 DIAGNOSIS — N186 End stage renal disease: Secondary | ICD-10-CM | POA: Diagnosis not present

## 2019-11-07 DIAGNOSIS — N2581 Secondary hyperparathyroidism of renal origin: Secondary | ICD-10-CM | POA: Diagnosis not present

## 2019-11-10 DIAGNOSIS — Z992 Dependence on renal dialysis: Secondary | ICD-10-CM | POA: Diagnosis not present

## 2019-11-10 DIAGNOSIS — N2581 Secondary hyperparathyroidism of renal origin: Secondary | ICD-10-CM | POA: Diagnosis not present

## 2019-11-10 DIAGNOSIS — N186 End stage renal disease: Secondary | ICD-10-CM | POA: Diagnosis not present

## 2019-11-10 DIAGNOSIS — Z23 Encounter for immunization: Secondary | ICD-10-CM | POA: Diagnosis not present

## 2019-11-12 DIAGNOSIS — N186 End stage renal disease: Secondary | ICD-10-CM | POA: Diagnosis not present

## 2019-11-12 DIAGNOSIS — Z992 Dependence on renal dialysis: Secondary | ICD-10-CM | POA: Diagnosis not present

## 2019-11-12 DIAGNOSIS — Z23 Encounter for immunization: Secondary | ICD-10-CM | POA: Diagnosis not present

## 2019-11-12 DIAGNOSIS — N2581 Secondary hyperparathyroidism of renal origin: Secondary | ICD-10-CM | POA: Diagnosis not present

## 2019-11-14 DIAGNOSIS — N2581 Secondary hyperparathyroidism of renal origin: Secondary | ICD-10-CM | POA: Diagnosis not present

## 2019-11-14 DIAGNOSIS — N186 End stage renal disease: Secondary | ICD-10-CM | POA: Diagnosis not present

## 2019-11-14 DIAGNOSIS — Z23 Encounter for immunization: Secondary | ICD-10-CM | POA: Diagnosis not present

## 2019-11-14 DIAGNOSIS — Z992 Dependence on renal dialysis: Secondary | ICD-10-CM | POA: Diagnosis not present

## 2019-11-17 DIAGNOSIS — N2581 Secondary hyperparathyroidism of renal origin: Secondary | ICD-10-CM | POA: Diagnosis not present

## 2019-11-17 DIAGNOSIS — Z23 Encounter for immunization: Secondary | ICD-10-CM | POA: Diagnosis not present

## 2019-11-17 DIAGNOSIS — N186 End stage renal disease: Secondary | ICD-10-CM | POA: Diagnosis not present

## 2019-11-17 DIAGNOSIS — Z992 Dependence on renal dialysis: Secondary | ICD-10-CM | POA: Diagnosis not present

## 2019-11-19 DIAGNOSIS — N2581 Secondary hyperparathyroidism of renal origin: Secondary | ICD-10-CM | POA: Diagnosis not present

## 2019-11-19 DIAGNOSIS — N186 End stage renal disease: Secondary | ICD-10-CM | POA: Diagnosis not present

## 2019-11-19 DIAGNOSIS — Z992 Dependence on renal dialysis: Secondary | ICD-10-CM | POA: Diagnosis not present

## 2019-11-19 DIAGNOSIS — Z23 Encounter for immunization: Secondary | ICD-10-CM | POA: Diagnosis not present

## 2019-11-21 DIAGNOSIS — N186 End stage renal disease: Secondary | ICD-10-CM | POA: Diagnosis not present

## 2019-11-21 DIAGNOSIS — N2581 Secondary hyperparathyroidism of renal origin: Secondary | ICD-10-CM | POA: Diagnosis not present

## 2019-11-21 DIAGNOSIS — Z23 Encounter for immunization: Secondary | ICD-10-CM | POA: Diagnosis not present

## 2019-11-21 DIAGNOSIS — Z992 Dependence on renal dialysis: Secondary | ICD-10-CM | POA: Diagnosis not present

## 2019-11-24 DIAGNOSIS — N186 End stage renal disease: Secondary | ICD-10-CM | POA: Diagnosis not present

## 2019-11-24 DIAGNOSIS — Z992 Dependence on renal dialysis: Secondary | ICD-10-CM | POA: Diagnosis not present

## 2019-11-24 DIAGNOSIS — N2581 Secondary hyperparathyroidism of renal origin: Secondary | ICD-10-CM | POA: Diagnosis not present

## 2019-11-24 DIAGNOSIS — Z23 Encounter for immunization: Secondary | ICD-10-CM | POA: Diagnosis not present

## 2019-11-26 DIAGNOSIS — Z23 Encounter for immunization: Secondary | ICD-10-CM | POA: Diagnosis not present

## 2019-11-26 DIAGNOSIS — N186 End stage renal disease: Secondary | ICD-10-CM | POA: Diagnosis not present

## 2019-11-26 DIAGNOSIS — Z992 Dependence on renal dialysis: Secondary | ICD-10-CM | POA: Diagnosis not present

## 2019-11-26 DIAGNOSIS — N2581 Secondary hyperparathyroidism of renal origin: Secondary | ICD-10-CM | POA: Diagnosis not present

## 2019-11-27 DIAGNOSIS — I120 Hypertensive chronic kidney disease with stage 5 chronic kidney disease or end stage renal disease: Secondary | ICD-10-CM | POA: Diagnosis not present

## 2019-11-27 DIAGNOSIS — Z992 Dependence on renal dialysis: Secondary | ICD-10-CM | POA: Diagnosis not present

## 2019-11-27 DIAGNOSIS — N186 End stage renal disease: Secondary | ICD-10-CM | POA: Diagnosis not present

## 2019-11-28 DIAGNOSIS — N2581 Secondary hyperparathyroidism of renal origin: Secondary | ICD-10-CM | POA: Diagnosis not present

## 2019-11-28 DIAGNOSIS — N186 End stage renal disease: Secondary | ICD-10-CM | POA: Diagnosis not present

## 2019-11-28 DIAGNOSIS — Z992 Dependence on renal dialysis: Secondary | ICD-10-CM | POA: Diagnosis not present

## 2019-12-01 DIAGNOSIS — N186 End stage renal disease: Secondary | ICD-10-CM | POA: Diagnosis not present

## 2019-12-01 DIAGNOSIS — N2581 Secondary hyperparathyroidism of renal origin: Secondary | ICD-10-CM | POA: Diagnosis not present

## 2019-12-01 DIAGNOSIS — Z992 Dependence on renal dialysis: Secondary | ICD-10-CM | POA: Diagnosis not present

## 2019-12-03 DIAGNOSIS — Z992 Dependence on renal dialysis: Secondary | ICD-10-CM | POA: Diagnosis not present

## 2019-12-03 DIAGNOSIS — N186 End stage renal disease: Secondary | ICD-10-CM | POA: Diagnosis not present

## 2019-12-03 DIAGNOSIS — N2581 Secondary hyperparathyroidism of renal origin: Secondary | ICD-10-CM | POA: Diagnosis not present

## 2019-12-05 DIAGNOSIS — Z992 Dependence on renal dialysis: Secondary | ICD-10-CM | POA: Diagnosis not present

## 2019-12-05 DIAGNOSIS — N2581 Secondary hyperparathyroidism of renal origin: Secondary | ICD-10-CM | POA: Diagnosis not present

## 2019-12-05 DIAGNOSIS — N186 End stage renal disease: Secondary | ICD-10-CM | POA: Diagnosis not present

## 2019-12-08 DIAGNOSIS — N2581 Secondary hyperparathyroidism of renal origin: Secondary | ICD-10-CM | POA: Diagnosis not present

## 2019-12-08 DIAGNOSIS — Z992 Dependence on renal dialysis: Secondary | ICD-10-CM | POA: Diagnosis not present

## 2019-12-08 DIAGNOSIS — N186 End stage renal disease: Secondary | ICD-10-CM | POA: Diagnosis not present

## 2019-12-10 DIAGNOSIS — N186 End stage renal disease: Secondary | ICD-10-CM | POA: Diagnosis not present

## 2019-12-10 DIAGNOSIS — Z992 Dependence on renal dialysis: Secondary | ICD-10-CM | POA: Diagnosis not present

## 2019-12-10 DIAGNOSIS — N2581 Secondary hyperparathyroidism of renal origin: Secondary | ICD-10-CM | POA: Diagnosis not present

## 2019-12-12 DIAGNOSIS — Z992 Dependence on renal dialysis: Secondary | ICD-10-CM | POA: Diagnosis not present

## 2019-12-12 DIAGNOSIS — N186 End stage renal disease: Secondary | ICD-10-CM | POA: Diagnosis not present

## 2019-12-12 DIAGNOSIS — N2581 Secondary hyperparathyroidism of renal origin: Secondary | ICD-10-CM | POA: Diagnosis not present

## 2019-12-15 DIAGNOSIS — N186 End stage renal disease: Secondary | ICD-10-CM | POA: Diagnosis not present

## 2019-12-15 DIAGNOSIS — Z992 Dependence on renal dialysis: Secondary | ICD-10-CM | POA: Diagnosis not present

## 2019-12-15 DIAGNOSIS — N2581 Secondary hyperparathyroidism of renal origin: Secondary | ICD-10-CM | POA: Diagnosis not present

## 2019-12-17 DIAGNOSIS — N186 End stage renal disease: Secondary | ICD-10-CM | POA: Diagnosis not present

## 2019-12-17 DIAGNOSIS — N2581 Secondary hyperparathyroidism of renal origin: Secondary | ICD-10-CM | POA: Diagnosis not present

## 2019-12-17 DIAGNOSIS — Z992 Dependence on renal dialysis: Secondary | ICD-10-CM | POA: Diagnosis not present

## 2019-12-19 DIAGNOSIS — N186 End stage renal disease: Secondary | ICD-10-CM | POA: Diagnosis not present

## 2019-12-19 DIAGNOSIS — Z992 Dependence on renal dialysis: Secondary | ICD-10-CM | POA: Diagnosis not present

## 2019-12-19 DIAGNOSIS — N2581 Secondary hyperparathyroidism of renal origin: Secondary | ICD-10-CM | POA: Diagnosis not present

## 2019-12-22 DIAGNOSIS — N186 End stage renal disease: Secondary | ICD-10-CM | POA: Diagnosis not present

## 2019-12-22 DIAGNOSIS — N2581 Secondary hyperparathyroidism of renal origin: Secondary | ICD-10-CM | POA: Diagnosis not present

## 2019-12-22 DIAGNOSIS — Z992 Dependence on renal dialysis: Secondary | ICD-10-CM | POA: Diagnosis not present

## 2019-12-24 DIAGNOSIS — N2581 Secondary hyperparathyroidism of renal origin: Secondary | ICD-10-CM | POA: Diagnosis not present

## 2019-12-24 DIAGNOSIS — Z992 Dependence on renal dialysis: Secondary | ICD-10-CM | POA: Diagnosis not present

## 2019-12-24 DIAGNOSIS — N186 End stage renal disease: Secondary | ICD-10-CM | POA: Diagnosis not present

## 2019-12-26 DIAGNOSIS — N186 End stage renal disease: Secondary | ICD-10-CM | POA: Diagnosis not present

## 2019-12-26 DIAGNOSIS — Z992 Dependence on renal dialysis: Secondary | ICD-10-CM | POA: Diagnosis not present

## 2019-12-26 DIAGNOSIS — N2581 Secondary hyperparathyroidism of renal origin: Secondary | ICD-10-CM | POA: Diagnosis not present

## 2019-12-27 DIAGNOSIS — N186 End stage renal disease: Secondary | ICD-10-CM | POA: Diagnosis not present

## 2019-12-27 DIAGNOSIS — Z992 Dependence on renal dialysis: Secondary | ICD-10-CM | POA: Diagnosis not present

## 2019-12-27 DIAGNOSIS — I120 Hypertensive chronic kidney disease with stage 5 chronic kidney disease or end stage renal disease: Secondary | ICD-10-CM | POA: Diagnosis not present

## 2019-12-29 DIAGNOSIS — Z992 Dependence on renal dialysis: Secondary | ICD-10-CM | POA: Diagnosis not present

## 2019-12-29 DIAGNOSIS — N186 End stage renal disease: Secondary | ICD-10-CM | POA: Diagnosis not present

## 2019-12-29 DIAGNOSIS — N2581 Secondary hyperparathyroidism of renal origin: Secondary | ICD-10-CM | POA: Diagnosis not present

## 2019-12-31 DIAGNOSIS — N186 End stage renal disease: Secondary | ICD-10-CM | POA: Diagnosis not present

## 2019-12-31 DIAGNOSIS — N2581 Secondary hyperparathyroidism of renal origin: Secondary | ICD-10-CM | POA: Diagnosis not present

## 2019-12-31 DIAGNOSIS — Z992 Dependence on renal dialysis: Secondary | ICD-10-CM | POA: Diagnosis not present

## 2020-01-02 DIAGNOSIS — N2581 Secondary hyperparathyroidism of renal origin: Secondary | ICD-10-CM | POA: Diagnosis not present

## 2020-01-02 DIAGNOSIS — N186 End stage renal disease: Secondary | ICD-10-CM | POA: Diagnosis not present

## 2020-01-02 DIAGNOSIS — Z992 Dependence on renal dialysis: Secondary | ICD-10-CM | POA: Diagnosis not present

## 2020-01-02 IMAGING — DX DG CHEST 1V PORT
2 series · 2 of 2 positions shown · non-contrast
Comparison: 08/15/2016

CLINICAL DATA: Chest pain

EXAM:
PORTABLE CHEST 1 VIEW

[chest ap (1 of 2)]
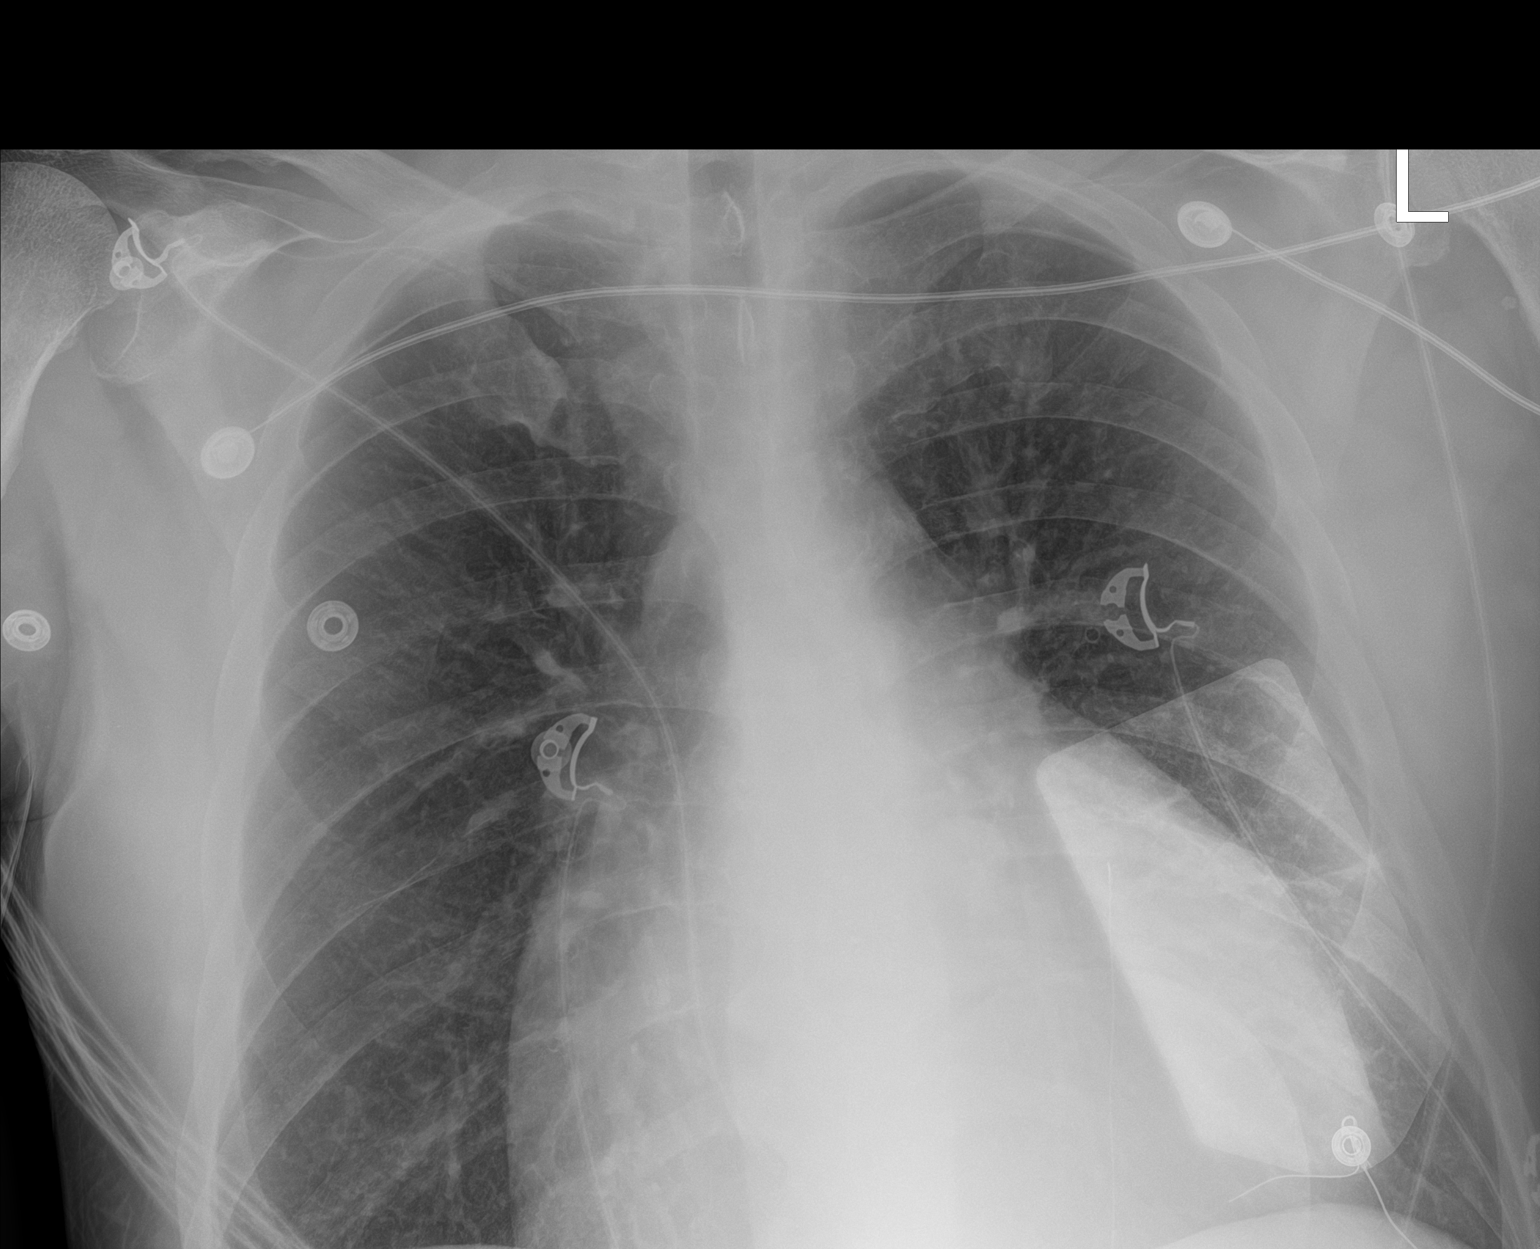

[chest ap (2 of 2)]
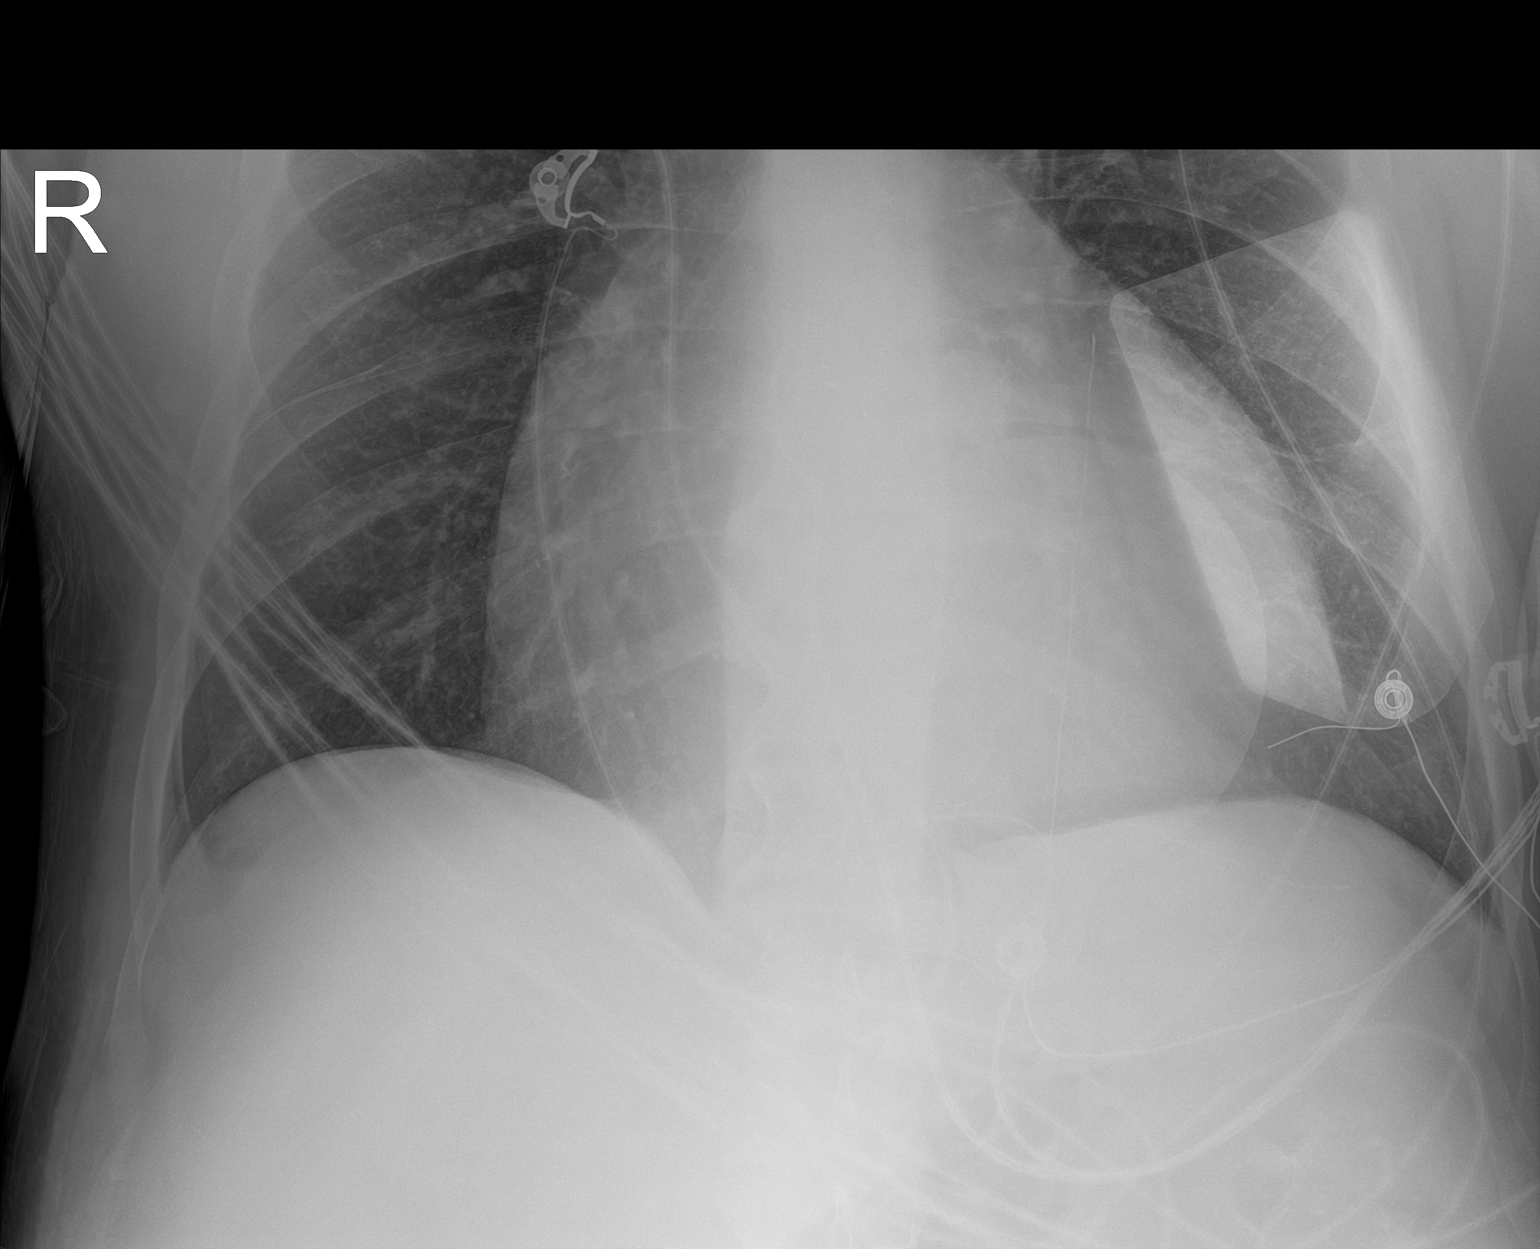

[2 of 2 positions shown; findings below may reference images not displayed]

FINDINGS: Cardiomegaly. Normal aortic contours. Artifact from EKG leads and
defibrillator pads. There is no edema, consolidation, effusion, or
pneumothorax.
IMPRESSION: Cardiomegaly without failure.

## 2020-01-05 DIAGNOSIS — N2581 Secondary hyperparathyroidism of renal origin: Secondary | ICD-10-CM | POA: Diagnosis not present

## 2020-01-05 DIAGNOSIS — Z992 Dependence on renal dialysis: Secondary | ICD-10-CM | POA: Diagnosis not present

## 2020-01-05 DIAGNOSIS — N186 End stage renal disease: Secondary | ICD-10-CM | POA: Diagnosis not present

## 2020-01-07 DIAGNOSIS — Z992 Dependence on renal dialysis: Secondary | ICD-10-CM | POA: Diagnosis not present

## 2020-01-07 DIAGNOSIS — N2581 Secondary hyperparathyroidism of renal origin: Secondary | ICD-10-CM | POA: Diagnosis not present

## 2020-01-07 DIAGNOSIS — N186 End stage renal disease: Secondary | ICD-10-CM | POA: Diagnosis not present

## 2020-01-09 DIAGNOSIS — N186 End stage renal disease: Secondary | ICD-10-CM | POA: Diagnosis not present

## 2020-01-09 DIAGNOSIS — Z992 Dependence on renal dialysis: Secondary | ICD-10-CM | POA: Diagnosis not present

## 2020-01-09 DIAGNOSIS — N2581 Secondary hyperparathyroidism of renal origin: Secondary | ICD-10-CM | POA: Diagnosis not present

## 2020-01-12 DIAGNOSIS — Z992 Dependence on renal dialysis: Secondary | ICD-10-CM | POA: Diagnosis not present

## 2020-01-12 DIAGNOSIS — N2581 Secondary hyperparathyroidism of renal origin: Secondary | ICD-10-CM | POA: Diagnosis not present

## 2020-01-12 DIAGNOSIS — N186 End stage renal disease: Secondary | ICD-10-CM | POA: Diagnosis not present

## 2020-01-14 DIAGNOSIS — N2581 Secondary hyperparathyroidism of renal origin: Secondary | ICD-10-CM | POA: Diagnosis not present

## 2020-01-14 DIAGNOSIS — N186 End stage renal disease: Secondary | ICD-10-CM | POA: Diagnosis not present

## 2020-01-14 DIAGNOSIS — Z992 Dependence on renal dialysis: Secondary | ICD-10-CM | POA: Diagnosis not present

## 2020-01-16 DIAGNOSIS — Z992 Dependence on renal dialysis: Secondary | ICD-10-CM | POA: Diagnosis not present

## 2020-01-16 DIAGNOSIS — N2581 Secondary hyperparathyroidism of renal origin: Secondary | ICD-10-CM | POA: Diagnosis not present

## 2020-01-16 DIAGNOSIS — N186 End stage renal disease: Secondary | ICD-10-CM | POA: Diagnosis not present

## 2020-01-19 DIAGNOSIS — N2581 Secondary hyperparathyroidism of renal origin: Secondary | ICD-10-CM | POA: Diagnosis not present

## 2020-01-19 DIAGNOSIS — Z992 Dependence on renal dialysis: Secondary | ICD-10-CM | POA: Diagnosis not present

## 2020-01-19 DIAGNOSIS — N186 End stage renal disease: Secondary | ICD-10-CM | POA: Diagnosis not present

## 2020-01-21 DIAGNOSIS — N2581 Secondary hyperparathyroidism of renal origin: Secondary | ICD-10-CM | POA: Diagnosis not present

## 2020-01-21 DIAGNOSIS — Z992 Dependence on renal dialysis: Secondary | ICD-10-CM | POA: Diagnosis not present

## 2020-01-21 DIAGNOSIS — N186 End stage renal disease: Secondary | ICD-10-CM | POA: Diagnosis not present

## 2020-01-23 DIAGNOSIS — N2581 Secondary hyperparathyroidism of renal origin: Secondary | ICD-10-CM | POA: Diagnosis not present

## 2020-01-23 DIAGNOSIS — Z992 Dependence on renal dialysis: Secondary | ICD-10-CM | POA: Diagnosis not present

## 2020-01-23 DIAGNOSIS — N186 End stage renal disease: Secondary | ICD-10-CM | POA: Diagnosis not present

## 2020-01-26 DIAGNOSIS — N2581 Secondary hyperparathyroidism of renal origin: Secondary | ICD-10-CM | POA: Diagnosis not present

## 2020-01-26 DIAGNOSIS — N186 End stage renal disease: Secondary | ICD-10-CM | POA: Diagnosis not present

## 2020-01-26 DIAGNOSIS — Z992 Dependence on renal dialysis: Secondary | ICD-10-CM | POA: Diagnosis not present

## 2020-02-10 DIAGNOSIS — M5136 Other intervertebral disc degeneration, lumbar region: Secondary | ICD-10-CM | POA: Diagnosis not present

## 2020-02-10 DIAGNOSIS — M545 Low back pain: Secondary | ICD-10-CM | POA: Diagnosis not present

## 2020-02-26 DIAGNOSIS — I120 Hypertensive chronic kidney disease with stage 5 chronic kidney disease or end stage renal disease: Secondary | ICD-10-CM | POA: Diagnosis not present

## 2020-02-26 DIAGNOSIS — N186 End stage renal disease: Secondary | ICD-10-CM | POA: Diagnosis not present

## 2020-02-26 DIAGNOSIS — Z992 Dependence on renal dialysis: Secondary | ICD-10-CM | POA: Diagnosis not present

## 2020-02-27 DIAGNOSIS — N186 End stage renal disease: Secondary | ICD-10-CM | POA: Diagnosis not present

## 2020-02-27 DIAGNOSIS — N2581 Secondary hyperparathyroidism of renal origin: Secondary | ICD-10-CM | POA: Diagnosis not present

## 2020-02-27 DIAGNOSIS — Z992 Dependence on renal dialysis: Secondary | ICD-10-CM | POA: Diagnosis not present

## 2020-02-27 DIAGNOSIS — D631 Anemia in chronic kidney disease: Secondary | ICD-10-CM | POA: Diagnosis not present

## 2020-03-01 DIAGNOSIS — N186 End stage renal disease: Secondary | ICD-10-CM | POA: Diagnosis not present

## 2020-03-01 DIAGNOSIS — N2581 Secondary hyperparathyroidism of renal origin: Secondary | ICD-10-CM | POA: Diagnosis not present

## 2020-03-01 DIAGNOSIS — D631 Anemia in chronic kidney disease: Secondary | ICD-10-CM | POA: Diagnosis not present

## 2020-03-01 DIAGNOSIS — Z992 Dependence on renal dialysis: Secondary | ICD-10-CM | POA: Diagnosis not present

## 2020-03-03 DIAGNOSIS — D631 Anemia in chronic kidney disease: Secondary | ICD-10-CM | POA: Diagnosis not present

## 2020-03-03 DIAGNOSIS — N186 End stage renal disease: Secondary | ICD-10-CM | POA: Diagnosis not present

## 2020-03-03 DIAGNOSIS — N2581 Secondary hyperparathyroidism of renal origin: Secondary | ICD-10-CM | POA: Diagnosis not present

## 2020-03-03 DIAGNOSIS — Z992 Dependence on renal dialysis: Secondary | ICD-10-CM | POA: Diagnosis not present

## 2020-03-05 DIAGNOSIS — D631 Anemia in chronic kidney disease: Secondary | ICD-10-CM | POA: Diagnosis not present

## 2020-03-05 DIAGNOSIS — N2581 Secondary hyperparathyroidism of renal origin: Secondary | ICD-10-CM | POA: Diagnosis not present

## 2020-03-05 DIAGNOSIS — Z992 Dependence on renal dialysis: Secondary | ICD-10-CM | POA: Diagnosis not present

## 2020-03-05 DIAGNOSIS — N186 End stage renal disease: Secondary | ICD-10-CM | POA: Diagnosis not present

## 2020-03-08 DIAGNOSIS — N186 End stage renal disease: Secondary | ICD-10-CM | POA: Diagnosis not present

## 2020-03-08 DIAGNOSIS — N2581 Secondary hyperparathyroidism of renal origin: Secondary | ICD-10-CM | POA: Diagnosis not present

## 2020-03-08 DIAGNOSIS — D631 Anemia in chronic kidney disease: Secondary | ICD-10-CM | POA: Diagnosis not present

## 2020-03-08 DIAGNOSIS — Z992 Dependence on renal dialysis: Secondary | ICD-10-CM | POA: Diagnosis not present

## 2020-03-09 DIAGNOSIS — M545 Low back pain: Secondary | ICD-10-CM | POA: Diagnosis not present

## 2020-03-10 DIAGNOSIS — N2581 Secondary hyperparathyroidism of renal origin: Secondary | ICD-10-CM | POA: Diagnosis not present

## 2020-03-10 DIAGNOSIS — N186 End stage renal disease: Secondary | ICD-10-CM | POA: Diagnosis not present

## 2020-03-10 DIAGNOSIS — Z992 Dependence on renal dialysis: Secondary | ICD-10-CM | POA: Diagnosis not present

## 2020-03-10 DIAGNOSIS — D631 Anemia in chronic kidney disease: Secondary | ICD-10-CM | POA: Diagnosis not present

## 2020-03-11 DIAGNOSIS — M545 Low back pain: Secondary | ICD-10-CM | POA: Diagnosis not present

## 2020-03-12 DIAGNOSIS — N186 End stage renal disease: Secondary | ICD-10-CM | POA: Diagnosis not present

## 2020-03-12 DIAGNOSIS — D631 Anemia in chronic kidney disease: Secondary | ICD-10-CM | POA: Diagnosis not present

## 2020-03-12 DIAGNOSIS — N2581 Secondary hyperparathyroidism of renal origin: Secondary | ICD-10-CM | POA: Diagnosis not present

## 2020-03-12 DIAGNOSIS — Z992 Dependence on renal dialysis: Secondary | ICD-10-CM | POA: Diagnosis not present

## 2020-03-15 DIAGNOSIS — Z992 Dependence on renal dialysis: Secondary | ICD-10-CM | POA: Diagnosis not present

## 2020-03-15 DIAGNOSIS — N2581 Secondary hyperparathyroidism of renal origin: Secondary | ICD-10-CM | POA: Diagnosis not present

## 2020-03-15 DIAGNOSIS — D631 Anemia in chronic kidney disease: Secondary | ICD-10-CM | POA: Diagnosis not present

## 2020-03-15 DIAGNOSIS — N186 End stage renal disease: Secondary | ICD-10-CM | POA: Diagnosis not present

## 2020-03-16 DIAGNOSIS — M545 Low back pain: Secondary | ICD-10-CM | POA: Diagnosis not present

## 2020-03-17 DIAGNOSIS — Z992 Dependence on renal dialysis: Secondary | ICD-10-CM | POA: Diagnosis not present

## 2020-03-17 DIAGNOSIS — D631 Anemia in chronic kidney disease: Secondary | ICD-10-CM | POA: Diagnosis not present

## 2020-03-17 DIAGNOSIS — N186 End stage renal disease: Secondary | ICD-10-CM | POA: Diagnosis not present

## 2020-03-17 DIAGNOSIS — N2581 Secondary hyperparathyroidism of renal origin: Secondary | ICD-10-CM | POA: Diagnosis not present

## 2020-03-18 DIAGNOSIS — M545 Low back pain: Secondary | ICD-10-CM | POA: Diagnosis not present

## 2020-03-19 DIAGNOSIS — N186 End stage renal disease: Secondary | ICD-10-CM | POA: Diagnosis not present

## 2020-03-19 DIAGNOSIS — N2581 Secondary hyperparathyroidism of renal origin: Secondary | ICD-10-CM | POA: Diagnosis not present

## 2020-03-19 DIAGNOSIS — D631 Anemia in chronic kidney disease: Secondary | ICD-10-CM | POA: Diagnosis not present

## 2020-03-19 DIAGNOSIS — Z992 Dependence on renal dialysis: Secondary | ICD-10-CM | POA: Diagnosis not present

## 2020-03-22 ENCOUNTER — Emergency Department (HOSPITAL_COMMUNITY)
Admission: EM | Admit: 2020-03-22 | Discharge: 2020-03-23 | Disposition: A | Discharge status: Home / Self Care | Payer: Medicare Other | Attending: Emergency Medicine | Admitting: Emergency Medicine

## 2020-03-22 ENCOUNTER — Encounter (HOSPITAL_COMMUNITY): Payer: Self-pay | Admitting: *Deleted

## 2020-03-22 DIAGNOSIS — N189 Chronic kidney disease, unspecified: Secondary | ICD-10-CM | POA: Insufficient documentation

## 2020-03-22 DIAGNOSIS — Z992 Dependence on renal dialysis: Secondary | ICD-10-CM | POA: Diagnosis not present

## 2020-03-22 DIAGNOSIS — I2510 Atherosclerotic heart disease of native coronary artery without angina pectoris: Secondary | ICD-10-CM | POA: Diagnosis not present

## 2020-03-22 DIAGNOSIS — I129 Hypertensive chronic kidney disease with stage 1 through stage 4 chronic kidney disease, or unspecified chronic kidney disease: Secondary | ICD-10-CM | POA: Diagnosis not present

## 2020-03-22 DIAGNOSIS — Z79899 Other long term (current) drug therapy: Secondary | ICD-10-CM | POA: Insufficient documentation

## 2020-03-22 DIAGNOSIS — R109 Unspecified abdominal pain: Secondary | ICD-10-CM | POA: Diagnosis not present

## 2020-03-22 DIAGNOSIS — I700 Atherosclerosis of aorta: Secondary | ICD-10-CM | POA: Diagnosis not present

## 2020-03-22 DIAGNOSIS — F17210 Nicotine dependence, cigarettes, uncomplicated: Secondary | ICD-10-CM | POA: Insufficient documentation

## 2020-03-22 DIAGNOSIS — N186 End stage renal disease: Secondary | ICD-10-CM | POA: Diagnosis not present

## 2020-03-22 DIAGNOSIS — N2581 Secondary hyperparathyroidism of renal origin: Secondary | ICD-10-CM | POA: Diagnosis not present

## 2020-03-22 DIAGNOSIS — D631 Anemia in chronic kidney disease: Secondary | ICD-10-CM | POA: Diagnosis not present

## 2020-03-22 DIAGNOSIS — M160 Bilateral primary osteoarthritis of hip: Secondary | ICD-10-CM | POA: Diagnosis not present

## 2020-03-22 DIAGNOSIS — R1031 Right lower quadrant pain: Secondary | ICD-10-CM | POA: Diagnosis not present

## 2020-03-22 DIAGNOSIS — I517 Cardiomegaly: Secondary | ICD-10-CM | POA: Diagnosis not present

## 2020-03-22 LAB — BASIC METABOLIC PANEL
Anion gap: 18 — ABNORMAL HIGH (ref 5–15)
BUN: 34 mg/dL — ABNORMAL HIGH (ref 6–20)
CO2: 26 mmol/L (ref 22–32)
Calcium: 8.7 mg/dL — ABNORMAL LOW (ref 8.9–10.3)
Chloride: 91 mmol/L — ABNORMAL LOW (ref 98–111)
Creatinine, Ser: 7.68 mg/dL — ABNORMAL HIGH (ref 0.61–1.24)
GFR calc Af Amer: 8 mL/min — ABNORMAL LOW (ref >60)
GFR calc non Af Amer: 7 mL/min — ABNORMAL LOW (ref >60)
Glucose, Bld: 107 mg/dL — ABNORMAL HIGH (ref 70–99)
Potassium: 3.6 mmol/L (ref 3.5–5.1)
Sodium: 135 mmol/L (ref 135–145)

## 2020-03-22 LAB — CBC
HCT: 39.4 % (ref 39.0–52.0)
Hemoglobin: 12.5 g/dL — ABNORMAL LOW (ref 13.0–17.0)
MCH: 28.3 pg (ref 26.0–34.0)
MCHC: 31.7 g/dL (ref 30.0–36.0)
MCV: 89.1 fL (ref 80.0–100.0)
Platelets: 233 10*3/uL (ref 150–400)
RBC: 4.42 MIL/uL (ref 4.22–5.81)
RDW: 16.8 % — ABNORMAL HIGH (ref 11.5–15.5)
WBC: 11.2 10*3/uL — ABNORMAL HIGH (ref 4.0–10.5)
nRBC: 0 % (ref 0.0–0.2)

## 2020-03-22 NOTE — ED Triage Notes (Signed)
To ED for eval of right flank pain for past 4 hrs. Pt was on dialysis when pain started. Does not urinate. No vomiting. Nausea. Pain doesn't move or get worse with movement.

## 2020-03-23 ENCOUNTER — Emergency Department (HOSPITAL_COMMUNITY): Payer: Medicare Other

## 2020-03-23 DIAGNOSIS — M160 Bilateral primary osteoarthritis of hip: Secondary | ICD-10-CM | POA: Diagnosis not present

## 2020-03-23 DIAGNOSIS — R109 Unspecified abdominal pain: Secondary | ICD-10-CM | POA: Diagnosis not present

## 2020-03-23 DIAGNOSIS — I700 Atherosclerosis of aorta: Secondary | ICD-10-CM | POA: Diagnosis not present

## 2020-03-23 DIAGNOSIS — I517 Cardiomegaly: Secondary | ICD-10-CM | POA: Diagnosis not present

## 2020-03-23 LAB — HEPATIC FUNCTION PANEL
ALT: 27 U/L (ref 0–44)
AST: 31 U/L (ref 15–41)
Albumin: 3.8 g/dL (ref 3.5–5.0)
Alkaline Phosphatase: 112 U/L (ref 38–126)
Bilirubin, Direct: 0.2 mg/dL (ref 0.0–0.2)
Indirect Bilirubin: 0.9 mg/dL (ref 0.3–0.9)
Total Bilirubin: 1.1 mg/dL (ref 0.3–1.2)
Total Protein: 8.2 g/dL — ABNORMAL HIGH (ref 6.5–8.1)

## 2020-03-23 LAB — LIPASE, BLOOD: Lipase: 26 U/L (ref 11–51)

## 2020-03-23 MED ORDER — ONDANSETRON HCL 4 MG/2ML IJ SOLN
4.0000 mg | Freq: Once | INTRAMUSCULAR | Status: AC
  Administered 2020-03-23: 4 mg via INTRAVENOUS
  Filled 2020-03-23: qty 2

## 2020-03-23 MED ORDER — MORPHINE SULFATE (PF) 4 MG/ML IV SOLN
4.0000 mg | Freq: Once | INTRAVENOUS | Status: AC
  Administered 2020-03-23: 4 mg via INTRAVENOUS
  Filled 2020-03-23: qty 1

## 2020-03-23 MED ORDER — FENTANYL CITRATE (PF) 100 MCG/2ML IJ SOLN
50.0000 ug | Freq: Once | INTRAMUSCULAR | Status: AC
  Administered 2020-03-23: 50 ug via INTRAVENOUS
  Filled 2020-03-23: qty 2

## 2020-03-23 MED ORDER — ACETAMINOPHEN ER 650 MG PO TBCR: 650.0000 mg | EXTENDED_RELEASE_TABLET | Freq: Three times a day (TID) | ORAL | 0 refills | Status: AC | PRN

## 2020-03-23 MED ORDER — LIDOCAINE 5 % EX PTCH: 1.0000 | MEDICATED_PATCH | Freq: Every day | CUTANEOUS | 0 refills | Status: AC | PRN

## 2020-03-23 NOTE — ED Notes (Addendum)
Pt left arm restrict AV fistula for dialysis, restriction band placed at this time.

## 2020-03-23 NOTE — ED Provider Notes (Signed)
07:00: Assumed care of patient from [place] PA-C at change of shift pending lipase. Plan if this is within normal limits is to discharge with tx for MSK pain.   Please see prior provider note for full H&P.  Briefly patient is a 54 year old male with a history of ESRD on dialysis (MWF-most recently dialyzed 07/26) CAD, hypertension, and hyperlipidemia who presented to the emergency department with complaints of right flank pain.   Results for orders placed or performed during the hospital encounter of 03/22/20  CBC  Result Value Ref Range   WBC 11.2 (H) 4.0 - 10.5 K/uL   RBC 4.42 4.22 - 5.81 MIL/uL   Hemoglobin 12.5 (L) 13.0 - 17.0 g/dL   HCT 39.4 39 - 52 %   MCV 89.1 80.0 - 100.0 fL   MCH 28.3 26.0 - 34.0 pg   MCHC 31.7 30.0 - 36.0 g/dL   RDW 16.8 (H) 11.5 - 15.5 %   Platelets 233 150 - 400 K/uL   nRBC 0.0 0.0 - 0.2 %  Basic metabolic panel  Result Value Ref Range   Sodium 135 135 - 145 mmol/L   Potassium 3.6 3.5 - 5.1 mmol/L   Chloride 91 (L) 98 - 111 mmol/L   CO2 26 22 - 32 mmol/L   Glucose, Bld 107 (H) 70 - 99 mg/dL   BUN 34 (H) 6 - 20 mg/dL   Creatinine, Ser 7.68 (H) 0.61 - 1.24 mg/dL   Calcium 8.7 (L) 8.9 - 10.3 mg/dL   GFR calc non Af Amer 7 (L) >60 mL/min   GFR calc Af Amer 8 (L) >60 mL/min   Anion gap 18 (H) 5 - 15  Hepatic function panel  Result Value Ref Range   Total Protein 8.2 (H) 6.5 - 8.1 g/dL   Albumin 3.8 3.5 - 5.0 g/dL   AST 31 15 - 41 U/L   ALT 27 0 - 44 U/L   Alkaline Phosphatase 112 38 - 126 U/L   Total Bilirubin 1.1 0.3 - 1.2 mg/dL   Bilirubin, Direct 0.2 0.0 - 0.2 mg/dL   Indirect Bilirubin 0.9 0.3 - 0.9 mg/dL  Lipase, blood  Result Value Ref Range   Lipase 26 11 - 51 U/L   DG Chest 2 View  Result Date: 03/23/2020 CLINICAL DATA:  Abdominal pain.  Right flank pain. EXAM: CHEST - 2 VIEW COMPARISON:  Chest x-ray 05/26/2019, 02/09/2012. FINDINGS: Stable cardiomegaly. No focal infiltrate. Stable pleural thickening noted anteriorly. No pleural effusion or  pneumothorax. Degenerative change thoracic spine. Barium noted in the colon. Degenerative change thoracic spine. No acute bony abnormality identified. IMPRESSION: 1.  Stable cardiomegaly. 2.  No pulmonary disease. Electronically Signed   By: Marcello Moores  Register   On: 03/23/2020 07:00   CT Renal Stone Study  Result Date: 03/23/2020 CLINICAL DATA:  Right flank pain with kidney stone suspected EXAM: CT ABDOMEN AND PELVIS WITHOUT CONTRAST TECHNIQUE: Multidetector CT imaging of the abdomen and pelvis was performed following the standard protocol without IV contrast. COMPARISON:  07/21/2016 FINDINGS: Lower chest:  Small volume pericardial effusion, non progressed. Hepatobiliary: No focal liver abnormality.No evidence of biliary obstruction or stone. Pancreas: Unremarkable. Spleen: Unremarkable. Adrenals/Urinary Tract: Negative adrenals. Severe renal atrophy in the setting of end-stage renal disease with multiple bilateral cystic densities that are likely dialysis related. Hilar calcifications appear vascular branching. No hydronephrosis or suspected urinary stone. Unremarkable bladder. Stomach/Bowel: No obstruction. No appendicitis. Oral contrast is seen within the colon and colonic diverticula. Vascular/Lymphatic: No acute vascular abnormality.  Diffuse atherosclerotic calcification of the aorta and iliacs. No mass or adenopathy. Reproductive:No pathologic findings. Other: No ascites or pneumoperitoneum. Musculoskeletal: No acute abnormalities. Spondylosis and bilateral hip osteoarthritis. IMPRESSION: 1. No acute finding.  No hydronephrosis or ureteral calculus. 2. Chronic findings are stable from 2017 and described above. Electronically Signed   By: Monte Fantasia M.D.   On: 03/23/2020 06:19   Work-up reviewed: Labs fairly similar to prior.  Lipase has resulted and is within normal limits.  Patient had a chest x-ray that showed no acute process, stable cardiomegaly present as well as a CT renal stone study without  acute findings.  He has received morphine and subsequently fentanyl with improvement of his pain.  No peritoneal signs on abdominal exam.  No focal neurologic deficits.  Overall appears appropriate for discharge at this time.  Will discharge home with Tylenol and Lidoderm patches with PCP/orthopedics follow-up. I discussed results, treatment plan, need for follow-up, and return precautions with the patient. Provided opportunity for questions, patient confirmed understanding and is in agreement with plan.        Amaryllis Dyke, PA-C 03/23/20 0826    Lennice Sites, DO 03/23/20 [phone]

## 2020-03-23 NOTE — ED Provider Notes (Signed)
[facility] EMERGENCY DEPARTMENT Provider Note   CSN: [id number] Arrival date & time: 03/22/20  1448     History Chief Complaint  Patient presents with   Flank Pain    DON TIU is a 54 y.o. male possible history of end-stage renal disease (Monday Wednesday Friday dialysis), coronary artery disease, diverticulitis, hyperlipidemia, hypertension who presents for evaluation of right flank pain that began this afternoon.  He reports he got dialysis earlier today.  He states that they took off about 8 kg of fluid which he states is normal for him.  He reports that when he got home, he started having flank pain on the right side.  He states it is noted so radiates to the front abdomen.  He states he has had some nausea but denies any vomiting.  He has not noted any fevers.  He denies any preceding trauma, injury.  He does not make urine.  He has not taken anything for the pain.  He does have chronic back pain but he states that this feels different.  He denies any chest pain, difficulty breathing, vomiting, numbness/weakness of his arms or legs.  The history is provided by the patient.       Past Medical History:  Diagnosis Date   Arthritis    spine   Chronic kidney disease    [place] MWF   Coronary artery disease    Dialysis patient [facility]) 2013   Diverticulitis    Hyperlipidemia    Hypertension    dr  Tobie Lords      @ [facility]  med   Neuromuscular disorder [facility])    nerve pain - tx w/cymbalta   Neuropathic pain 09/20/2017   Left mid-thoracic    Patient Active Problem List   Diagnosis Date Noted   Hyperkalemia 05/26/2019   Hyponatremia 05/26/2019   CAD (coronary artery disease) 05/26/2019   Complete heart block, transient ([place]) 05/26/2019   Neuropathic pain 09/20/2017   Thoracic disc herniation 03/29/2017   Chronic bilateral low back pain without sciatica 03/29/2017   Other sleep disturbances 12/05/2012   End stage renal disease  ([place]) 12/05/2011   ESRD (end stage renal disease) ([place]) 11/11/2011   Anemia of chronic disease 11/11/2011   Hyperparathyroidism, secondary renal ([place]) 11/11/2011   HTN (hypertension) 11/11/2011   Acute kidney injury ([place]) 11/06/2011    Past Surgical History:  Procedure Laterality Date   AV FISTULA PLACEMENT  11/10/2011   Procedure: ARTERIOVENOUS (AV) FISTULA CREATION;  Surgeon: Mal Misty, MD;  Location: [place];  Service: Vascular;  Laterality: Left;  Ultrasound guided   CARDIAC CATHETERIZATION  09/19/2018   On stent study at [place]  02/09/2012   Procedure: INSERTION OF DIALYSIS CATHETER;  Surgeon: Mal Misty, MD;  Location: [place];  Service: Vascular;  Laterality: N/A;   MASS EXCISION Right 09/07/2018   Procedure: EXCISION MASS RIGHT THUMB;  Surgeon: Charlotte Crumb, MD;  Location: [place];  Service: Orthopedics;  Laterality: Right;  OR AXILLARY BLOCK   REVISON OF ARTERIOVENOUS FISTULA Left 04/08/2019   Procedure: REVISION PLICATION OF LEFT ARM ARTERIOVENOUS FISTULA;  Surgeon: Serafina Mitchell, MD;  Location: [place];  Service: Vascular;  Laterality: Left;   SHUNTOGRAM Left 06/03/2012   Procedure: Earney Mallet;  Surgeon: Conrad Kotzebue, MD;  Location: [facility] CATH LAB;  Service: Cardiovascular;  Laterality: Left;   SHUNTOGRAM Left 01/02/2013   Procedure: Earney Mallet;  Surgeon: Jannette Fogo  Bridgett Larsson, MD;  Location: [facility] CATH LAB;  Service: Cardiovascular;  Laterality: Left;       Family History  Problem Relation Age of Onset   Pancreatic cancer Mother    Diabetes Father    Hypertension Father    Cancer - Lung Paternal Grandfather     Social History   Tobacco Use   Smoking status: Light Tobacco Smoker    Packs/day: 0.25    Years: 43.00    Pack years: 10.75    Types: Cigarettes   Smokeless tobacco: Never Used   Tobacco comment: pt states that he is trying to quit--is trying to cut back at the current time  Vaping Use   Vaping  Use: Former   Quit date: 08/28/2018  Substance Use Topics   Alcohol use: Yes    Comment: occasional wine   Drug use: Yes    Types: Marijuana    Comment: Last dose Friday 04/04/19    Home Medications Prior to Admission medications   Medication Sig Start Date End Date Taking? Authorizing Provider  amLODipine (NORVASC) 10 MG tablet Take 10 mg by mouth at bedtime.     [provider]  aspirin EC 81 MG tablet Take 81 mg by mouth daily.    [provider]  atorvastatin (LIPITOR) 80 MG tablet Take 80 mg by mouth daily.  01/16/19   [provider]  carvedilol (COREG) 3.125 MG tablet Take 3.125 mg by mouth 2 (two) times daily with a meal.  01/16/19   [provider]  clindamycin (CLEOCIN) 300 MG capsule Take 1 capsule (300 mg total) by mouth 4 (four) times daily. X 7 days 07/29/19   Veryl Speak, MD  cloNIDine (CATAPRES) 0.1 MG tablet Take 0.2 mg by mouth daily. 07/13/16   [provider]  cyclobenzaprine (FLEXERIL) 10 MG tablet Take 20 mg by mouth 2 (two) times daily as needed for muscle spasms.     [provider]  DULoxetine (CYMBALTA) 60 MG capsule Take 60 mg by mouth every morning.  01/29/18   [provider]  FOSRENOL 1000 MG chewable tablet Chew 6,000 mg by mouth 3 (three) times daily with meals.  08/31/17   [provider]  gabapentin (NEURONTIN) 100 MG capsule Take 3 capsules (300 mg total) by mouth at bedtime. 09/20/17   Kathrynn Ducking, MD  HYDROcodone-acetaminophen (NORCO) 5-325 MG tablet Take 1 tablet by mouth every 6 (six) hours as needed for moderate pain. Patient not taking: Reported on 05/29/2019 04/08/19   Dagoberto Ligas, PA-C  hydrOXYzine (ATARAX/VISTARIL) 25 MG tablet Take 25 mg by mouth 2 (two) times daily as needed for itching (Sleep).     [provider]  multivitamin (RENA-VIT) TABS tablet Take 1 tablet by mouth See admin instructions. Every Monday, Wednesday and Friday    [provider]    naproxen sodium (ALEVE) 220 MG tablet Take 220 mg by mouth 2 (two) times daily as needed (pain).     [provider]  nortriptyline (PAMELOR) 25 MG capsule Take 25 mg by mouth every morning.  01/29/18   [provider]  pantoprazole (PROTONIX) 40 MG tablet Take 40 mg by mouth daily.  12/04/12   [provider]  VENTOLIN HFA 108 (90 Base) MCG/ACT inhaler Inhale 2 puffs into the lungs every 6 (six) hours as needed for wheezing or shortness of breath.  12/21/17   [provider]    Allergies    Lisinopril  Review of Systems   Review  of Systems  Constitutional: Negative for fever.  Respiratory: Negative for cough and shortness of breath.   Cardiovascular: Negative for chest pain.  Gastrointestinal: Negative for abdominal pain, nausea and vomiting.  Genitourinary: Positive for flank pain.  Neurological: Negative for headaches.  All other systems reviewed and are negative.   Physical Exam Updated Vital Signs BP (!) 155/89 (BP Location: Right Arm)    Pulse 90    Temp 97.9 F (36.6 C) (Oral)    Resp 19    SpO2 96%   Physical Exam Vitals and nursing note reviewed.  Constitutional:      Appearance: Normal appearance. He is well-developed.     Comments: Appears uncomfortable but NAD   HENT:     Head: Normocephalic and atraumatic.  Eyes:     General: Lids are normal.     Conjunctiva/sclera: Conjunctivae normal.     Pupils: Pupils are equal, round, and reactive to light.  Cardiovascular:     Rate and Rhythm: Normal rate and regular rhythm.     Pulses: Normal pulses.          Dorsalis pedis pulses are 2+ on the right side and 2+ on the left side.     Heart sounds: Normal heart sounds. No murmur heard.  No friction rub. No gallop.   Pulmonary:     Effort: Pulmonary effort is normal.     Breath sounds: Normal breath sounds.     Comments: Lungs clear to auscultation bilaterally.  Symmetric chest rise.  No wheezing, rales, rhonchi. Abdominal:      Palpations: Abdomen is soft. Abdomen is not rigid.     Tenderness: There is no abdominal tenderness. There is right CVA tenderness. There is no guarding.     Comments: Abdomen is soft, nondistended.  Tenderness palpation noted diffusely to the right lower abdomen no focal point.  Right-sided CVA tenderness noted as well.  No rigidity, guarding.  Musculoskeletal:        General: Normal range of motion.     Cervical back: Full passive range of motion without pain.  Skin:    General: Skin is warm and dry.     Capillary Refill: Capillary refill takes less than 2 seconds.  Neurological:     Mental Status: He is alert and oriented to person, place, and time.     Comments: Follows commands, Moves all extremities  5/5 strength to BUE and BLE  Sensation intact throughout all major nerve distributions  Psychiatric:        Speech: Speech normal.      ED Results / Procedures / Treatments   Labs (all labs ordered are listed, but only abnormal results are displayed) Labs Reviewed  CBC - Abnormal; Notable for the following components:      Result Value   WBC 11.2 (*)    Hemoglobin 12.5 (*)    RDW 16.8 (*)    All other components within normal limits  BASIC METABOLIC PANEL - Abnormal; Notable for the following components:   Chloride 91 (*)    Glucose, Bld 107 (*)    BUN 34 (*)    Creatinine, Ser 7.68 (*)    Calcium 8.7 (*)    GFR calc non Af Amer 7 (*)    GFR calc Af Amer 8 (*)    Anion gap 18 (*)    All other components within normal limits  HEPATIC FUNCTION PANEL - Abnormal; Notable for the following components:   Total Protein 8.2 (*)  All other components within normal limits  LIPASE, BLOOD    EKG None  Radiology DG Chest 2 View  Result Date: 03/23/2020 CLINICAL DATA:  Abdominal pain.  Right flank pain. EXAM: CHEST - 2 VIEW COMPARISON:  Chest x-ray 05/26/2019, 02/09/2012. FINDINGS: Stable cardiomegaly. No focal infiltrate. Stable pleural thickening noted anteriorly. No  pleural effusion or pneumothorax. Degenerative change thoracic spine. Barium noted in the colon. Degenerative change thoracic spine. No acute bony abnormality identified. IMPRESSION: 1.  Stable cardiomegaly. 2.  No pulmonary disease. Electronically Signed   By: Marcello Moores  Register   On: 03/23/2020 07:00   CT Renal Stone Study  Result Date: 03/23/2020 CLINICAL DATA:  Right flank pain with kidney stone suspected EXAM: CT ABDOMEN AND PELVIS WITHOUT CONTRAST TECHNIQUE: Multidetector CT imaging of the abdomen and pelvis was performed following the standard protocol without IV contrast. COMPARISON:  07/21/2016 FINDINGS: Lower chest:  Small volume pericardial effusion, non progressed. Hepatobiliary: No focal liver abnormality.No evidence of biliary obstruction or stone. Pancreas: Unremarkable. Spleen: Unremarkable. Adrenals/Urinary Tract: Negative adrenals. Severe renal atrophy in the setting of end-stage renal disease with multiple bilateral cystic densities that are likely dialysis related. Hilar calcifications appear vascular branching. No hydronephrosis or suspected urinary stone. Unremarkable bladder. Stomach/Bowel: No obstruction. No appendicitis. Oral contrast is seen within the colon and colonic diverticula. Vascular/Lymphatic: No acute vascular abnormality. Diffuse atherosclerotic calcification of the aorta and iliacs. No mass or adenopathy. Reproductive:No pathologic findings. Other: No ascites or pneumoperitoneum. Musculoskeletal: No acute abnormalities. Spondylosis and bilateral hip osteoarthritis. IMPRESSION: 1. No acute finding.  No hydronephrosis or ureteral calculus. 2. Chronic findings are stable from 2017 and described above. Electronically Signed   By: Monte Fantasia M.D.   On: 03/23/2020 06:19    Procedures Procedures (including critical care time)  Medications Ordered in ED Medications  fentaNYL (SUBLIMAZE) injection 50 mcg (has no administration in time range)  morphine 4 MG/ML injection 4  mg (4 mg Intravenous Given 03/23/20 0456)  ondansetron (ZOFRAN) injection 4 mg (4 mg Intravenous Given 03/23/20 0457)    ED Course  I have reviewed the triage vital signs and the nursing notes.  Pertinent labs & imaging results that were available during my care of the patient were reviewed by me and considered in my medical decision making (see chart for details).    MDM Rules/Calculators/A&P                          54 year old male who presents for evaluation of right flank pain that began earlier today.  He states it started after dialysis.  He is a Monday Wednesday Friday dialysis patient and had dialysis today where he had 8 kg removed.  He states is his normal amount.  He does not make urine.  Denies any fevers, nausea/vomiting.  On initial arrival, he is afebrile, nontoxic-appearing.  He appears uncomfortable but no acute distress.  Vital signs are stable.  On exam, he is tender palpation of the right CVA as well as right abdomen diffusely.  No focal tenderness.  Concern for GU etiology versus infectious etiology.  History/physical exam concerning for dissection.  Do not suspect acute spinal abnormality.  Patient is moving all extremities with no signs of weakness.  Labs ordered at triage.  BMP shows potassium 3.6.  BUN is 34, creatinine 7.68.  Anion gap is 18.  LFTs within normal limits.  CBC shows slight leukocytosis of 11.2.  CT scan shows no acute finding.  No  evidence of hydronephrosis or ureteral calculus.  Reevaluation.  Patient appears more comfortable.  He does report some improvement in pain but is still having some pain.  We will add on chest x-ray and lipase.  Patient signed out to Kennith Maes, PA-C with labs and imaging pending.   Portions of this note were generated with [employer]. Dictation errors may occur despite best attempts at proofreading.   Final Clinical Impression(s) / ED Diagnoses Final diagnoses:  Flank pain    Rx / DC Orders ED  Discharge Orders    None       Desma Mcgregor 03/23/20 1610    [name], MD 03/24/20 [phone]

## 2020-03-23 NOTE — ED Notes (Signed)
Pt pacing back and forth in front of pt treatment area, pt states "the pain feels better when I walk around."

## 2020-03-23 NOTE — Discharge Instructions (Addendum)
You were seen in the emergency department today for backslash flank pain as well as abdominal discomfort.  Your work-up in the emergency department was overall reassuring.  Your labs look similar to prior labs you have had done.  Your CT scan did not show any active stones trying to pass currently in your chest x-ray did not show findings of pneumonia or other new lung process.  We would like you to take Tylenol every 8 hours as needed for pain and apply a Lidoderm patch to your area most significant pain once per day, remove the patch within 12 hours.  We have prescribed you new medication(s) today. Discuss the medications prescribed today with your pharmacist as they can have adverse effects and interactions with your other medicines including over the counter and prescribed medications. Seek medical evaluation if you start to experience new or abnormal symptoms after taking one of these medicines, seek care immediately if you start to experience difficulty breathing, feeling of your throat closing, facial swelling, or rash as these could be indications of a more serious allergic reaction  Please follow with your primary care provider as well as your orthopedic provider within 3 days.  Return to the ER for new or worsening symptoms or any other concerns.

## 2020-03-23 NOTE — ED Notes (Signed)
Patient transported to X-ray 

## 2020-03-24 DIAGNOSIS — N2581 Secondary hyperparathyroidism of renal origin: Secondary | ICD-10-CM | POA: Diagnosis not present

## 2020-03-24 DIAGNOSIS — Z992 Dependence on renal dialysis: Secondary | ICD-10-CM | POA: Diagnosis not present

## 2020-03-24 DIAGNOSIS — N186 End stage renal disease: Secondary | ICD-10-CM | POA: Diagnosis not present

## 2020-03-24 DIAGNOSIS — D631 Anemia in chronic kidney disease: Secondary | ICD-10-CM | POA: Diagnosis not present

## 2020-03-25 DIAGNOSIS — M545 Low back pain: Secondary | ICD-10-CM | POA: Diagnosis not present

## 2020-03-26 DIAGNOSIS — Z992 Dependence on renal dialysis: Secondary | ICD-10-CM | POA: Diagnosis not present

## 2020-03-26 DIAGNOSIS — N2581 Secondary hyperparathyroidism of renal origin: Secondary | ICD-10-CM | POA: Diagnosis not present

## 2020-03-26 DIAGNOSIS — N186 End stage renal disease: Secondary | ICD-10-CM | POA: Diagnosis not present

## 2020-03-26 DIAGNOSIS — D631 Anemia in chronic kidney disease: Secondary | ICD-10-CM | POA: Diagnosis not present

## 2020-03-28 DIAGNOSIS — Z992 Dependence on renal dialysis: Secondary | ICD-10-CM | POA: Diagnosis not present

## 2020-03-28 DIAGNOSIS — N186 End stage renal disease: Secondary | ICD-10-CM | POA: Diagnosis not present

## 2020-03-28 DIAGNOSIS — I120 Hypertensive chronic kidney disease with stage 5 chronic kidney disease or end stage renal disease: Secondary | ICD-10-CM | POA: Diagnosis not present

## 2020-03-29 DIAGNOSIS — N186 End stage renal disease: Secondary | ICD-10-CM | POA: Diagnosis not present

## 2020-03-29 DIAGNOSIS — D509 Iron deficiency anemia, unspecified: Secondary | ICD-10-CM | POA: Diagnosis not present

## 2020-03-29 DIAGNOSIS — D631 Anemia in chronic kidney disease: Secondary | ICD-10-CM | POA: Diagnosis not present

## 2020-03-29 DIAGNOSIS — Z8614 Personal history of Methicillin resistant Staphylococcus aureus infection: Secondary | ICD-10-CM | POA: Diagnosis not present

## 2020-03-29 DIAGNOSIS — Z992 Dependence on renal dialysis: Secondary | ICD-10-CM | POA: Diagnosis not present

## 2020-03-29 DIAGNOSIS — N2581 Secondary hyperparathyroidism of renal origin: Secondary | ICD-10-CM | POA: Diagnosis not present

## 2020-03-30 DIAGNOSIS — M545 Low back pain: Secondary | ICD-10-CM | POA: Diagnosis not present

## 2020-03-31 DIAGNOSIS — D631 Anemia in chronic kidney disease: Secondary | ICD-10-CM | POA: Diagnosis not present

## 2020-03-31 DIAGNOSIS — D509 Iron deficiency anemia, unspecified: Secondary | ICD-10-CM | POA: Diagnosis not present

## 2020-03-31 DIAGNOSIS — N2581 Secondary hyperparathyroidism of renal origin: Secondary | ICD-10-CM | POA: Diagnosis not present

## 2020-03-31 DIAGNOSIS — N186 End stage renal disease: Secondary | ICD-10-CM | POA: Diagnosis not present

## 2020-03-31 DIAGNOSIS — Z8614 Personal history of Methicillin resistant Staphylococcus aureus infection: Secondary | ICD-10-CM | POA: Diagnosis not present

## 2020-03-31 DIAGNOSIS — Z992 Dependence on renal dialysis: Secondary | ICD-10-CM | POA: Diagnosis not present

## 2020-04-02 DIAGNOSIS — Z8614 Personal history of Methicillin resistant Staphylococcus aureus infection: Secondary | ICD-10-CM | POA: Diagnosis not present

## 2020-04-02 DIAGNOSIS — N2581 Secondary hyperparathyroidism of renal origin: Secondary | ICD-10-CM | POA: Diagnosis not present

## 2020-04-02 DIAGNOSIS — D509 Iron deficiency anemia, unspecified: Secondary | ICD-10-CM | POA: Diagnosis not present

## 2020-04-02 DIAGNOSIS — N186 End stage renal disease: Secondary | ICD-10-CM | POA: Diagnosis not present

## 2020-04-02 DIAGNOSIS — D631 Anemia in chronic kidney disease: Secondary | ICD-10-CM | POA: Diagnosis not present

## 2020-04-02 DIAGNOSIS — Z992 Dependence on renal dialysis: Secondary | ICD-10-CM | POA: Diagnosis not present

## 2020-04-05 DIAGNOSIS — N2581 Secondary hyperparathyroidism of renal origin: Secondary | ICD-10-CM | POA: Diagnosis not present

## 2020-04-05 DIAGNOSIS — N186 End stage renal disease: Secondary | ICD-10-CM | POA: Diagnosis not present

## 2020-04-05 DIAGNOSIS — Z8614 Personal history of Methicillin resistant Staphylococcus aureus infection: Secondary | ICD-10-CM | POA: Diagnosis not present

## 2020-04-05 DIAGNOSIS — Z992 Dependence on renal dialysis: Secondary | ICD-10-CM | POA: Diagnosis not present

## 2020-04-05 DIAGNOSIS — D509 Iron deficiency anemia, unspecified: Secondary | ICD-10-CM | POA: Diagnosis not present

## 2020-04-05 DIAGNOSIS — D631 Anemia in chronic kidney disease: Secondary | ICD-10-CM | POA: Diagnosis not present

## 2020-04-07 DIAGNOSIS — Z8614 Personal history of Methicillin resistant Staphylococcus aureus infection: Secondary | ICD-10-CM | POA: Diagnosis not present

## 2020-04-07 DIAGNOSIS — Z992 Dependence on renal dialysis: Secondary | ICD-10-CM | POA: Diagnosis not present

## 2020-04-07 DIAGNOSIS — N186 End stage renal disease: Secondary | ICD-10-CM | POA: Diagnosis not present

## 2020-04-07 DIAGNOSIS — N2581 Secondary hyperparathyroidism of renal origin: Secondary | ICD-10-CM | POA: Diagnosis not present

## 2020-04-07 DIAGNOSIS — D631 Anemia in chronic kidney disease: Secondary | ICD-10-CM | POA: Diagnosis not present

## 2020-04-07 DIAGNOSIS — D509 Iron deficiency anemia, unspecified: Secondary | ICD-10-CM | POA: Diagnosis not present

## 2020-04-09 DIAGNOSIS — D509 Iron deficiency anemia, unspecified: Secondary | ICD-10-CM | POA: Diagnosis not present

## 2020-04-09 DIAGNOSIS — N186 End stage renal disease: Secondary | ICD-10-CM | POA: Diagnosis not present

## 2020-04-09 DIAGNOSIS — Z992 Dependence on renal dialysis: Secondary | ICD-10-CM | POA: Diagnosis not present

## 2020-04-09 DIAGNOSIS — Z8614 Personal history of Methicillin resistant Staphylococcus aureus infection: Secondary | ICD-10-CM | POA: Diagnosis not present

## 2020-04-09 DIAGNOSIS — D631 Anemia in chronic kidney disease: Secondary | ICD-10-CM | POA: Diagnosis not present

## 2020-04-09 DIAGNOSIS — N2581 Secondary hyperparathyroidism of renal origin: Secondary | ICD-10-CM | POA: Diagnosis not present

## 2020-04-12 DIAGNOSIS — N2581 Secondary hyperparathyroidism of renal origin: Secondary | ICD-10-CM | POA: Diagnosis not present

## 2020-04-12 DIAGNOSIS — Z992 Dependence on renal dialysis: Secondary | ICD-10-CM | POA: Diagnosis not present

## 2020-04-12 DIAGNOSIS — Z8614 Personal history of Methicillin resistant Staphylococcus aureus infection: Secondary | ICD-10-CM | POA: Diagnosis not present

## 2020-04-12 DIAGNOSIS — D509 Iron deficiency anemia, unspecified: Secondary | ICD-10-CM | POA: Diagnosis not present

## 2020-04-12 DIAGNOSIS — D631 Anemia in chronic kidney disease: Secondary | ICD-10-CM | POA: Diagnosis not present

## 2020-04-12 DIAGNOSIS — N186 End stage renal disease: Secondary | ICD-10-CM | POA: Diagnosis not present

## 2020-04-14 DIAGNOSIS — N186 End stage renal disease: Secondary | ICD-10-CM | POA: Diagnosis not present

## 2020-04-14 DIAGNOSIS — N2581 Secondary hyperparathyroidism of renal origin: Secondary | ICD-10-CM | POA: Diagnosis not present

## 2020-04-14 DIAGNOSIS — D509 Iron deficiency anemia, unspecified: Secondary | ICD-10-CM | POA: Diagnosis not present

## 2020-04-14 DIAGNOSIS — Z992 Dependence on renal dialysis: Secondary | ICD-10-CM | POA: Diagnosis not present

## 2020-04-14 DIAGNOSIS — Z8614 Personal history of Methicillin resistant Staphylococcus aureus infection: Secondary | ICD-10-CM | POA: Diagnosis not present

## 2020-04-14 DIAGNOSIS — D631 Anemia in chronic kidney disease: Secondary | ICD-10-CM | POA: Diagnosis not present

## 2020-04-16 DIAGNOSIS — Z8614 Personal history of Methicillin resistant Staphylococcus aureus infection: Secondary | ICD-10-CM | POA: Diagnosis not present

## 2020-04-16 DIAGNOSIS — D631 Anemia in chronic kidney disease: Secondary | ICD-10-CM | POA: Diagnosis not present

## 2020-04-16 DIAGNOSIS — Z992 Dependence on renal dialysis: Secondary | ICD-10-CM | POA: Diagnosis not present

## 2020-04-16 DIAGNOSIS — D509 Iron deficiency anemia, unspecified: Secondary | ICD-10-CM | POA: Diagnosis not present

## 2020-04-16 DIAGNOSIS — N2581 Secondary hyperparathyroidism of renal origin: Secondary | ICD-10-CM | POA: Diagnosis not present

## 2020-04-16 DIAGNOSIS — N186 End stage renal disease: Secondary | ICD-10-CM | POA: Diagnosis not present

## 2020-04-19 DIAGNOSIS — Z992 Dependence on renal dialysis: Secondary | ICD-10-CM | POA: Diagnosis not present

## 2020-04-19 DIAGNOSIS — N186 End stage renal disease: Secondary | ICD-10-CM | POA: Diagnosis not present

## 2020-04-19 DIAGNOSIS — D509 Iron deficiency anemia, unspecified: Secondary | ICD-10-CM | POA: Diagnosis not present

## 2020-04-19 DIAGNOSIS — Z8614 Personal history of Methicillin resistant Staphylococcus aureus infection: Secondary | ICD-10-CM | POA: Diagnosis not present

## 2020-04-19 DIAGNOSIS — N2581 Secondary hyperparathyroidism of renal origin: Secondary | ICD-10-CM | POA: Diagnosis not present

## 2020-04-19 DIAGNOSIS — D631 Anemia in chronic kidney disease: Secondary | ICD-10-CM | POA: Diagnosis not present

## 2020-04-21 DIAGNOSIS — Z8614 Personal history of Methicillin resistant Staphylococcus aureus infection: Secondary | ICD-10-CM | POA: Diagnosis not present

## 2020-04-21 DIAGNOSIS — Z992 Dependence on renal dialysis: Secondary | ICD-10-CM | POA: Diagnosis not present

## 2020-04-21 DIAGNOSIS — N2581 Secondary hyperparathyroidism of renal origin: Secondary | ICD-10-CM | POA: Diagnosis not present

## 2020-04-21 DIAGNOSIS — D509 Iron deficiency anemia, unspecified: Secondary | ICD-10-CM | POA: Diagnosis not present

## 2020-04-21 DIAGNOSIS — N186 End stage renal disease: Secondary | ICD-10-CM | POA: Diagnosis not present

## 2020-04-21 DIAGNOSIS — D631 Anemia in chronic kidney disease: Secondary | ICD-10-CM | POA: Diagnosis not present

## 2020-04-23 DIAGNOSIS — Z8614 Personal history of Methicillin resistant Staphylococcus aureus infection: Secondary | ICD-10-CM | POA: Diagnosis not present

## 2020-04-23 DIAGNOSIS — D509 Iron deficiency anemia, unspecified: Secondary | ICD-10-CM | POA: Diagnosis not present

## 2020-04-23 DIAGNOSIS — N186 End stage renal disease: Secondary | ICD-10-CM | POA: Diagnosis not present

## 2020-04-23 DIAGNOSIS — D631 Anemia in chronic kidney disease: Secondary | ICD-10-CM | POA: Diagnosis not present

## 2020-04-23 DIAGNOSIS — Z992 Dependence on renal dialysis: Secondary | ICD-10-CM | POA: Diagnosis not present

## 2020-04-23 DIAGNOSIS — N2581 Secondary hyperparathyroidism of renal origin: Secondary | ICD-10-CM | POA: Diagnosis not present

## 2020-04-26 DIAGNOSIS — D509 Iron deficiency anemia, unspecified: Secondary | ICD-10-CM | POA: Diagnosis not present

## 2020-04-26 DIAGNOSIS — N2581 Secondary hyperparathyroidism of renal origin: Secondary | ICD-10-CM | POA: Diagnosis not present

## 2020-04-26 DIAGNOSIS — Z992 Dependence on renal dialysis: Secondary | ICD-10-CM | POA: Diagnosis not present

## 2020-04-26 DIAGNOSIS — D631 Anemia in chronic kidney disease: Secondary | ICD-10-CM | POA: Diagnosis not present

## 2020-04-26 DIAGNOSIS — N186 End stage renal disease: Secondary | ICD-10-CM | POA: Diagnosis not present

## 2020-04-26 DIAGNOSIS — Z8614 Personal history of Methicillin resistant Staphylococcus aureus infection: Secondary | ICD-10-CM | POA: Diagnosis not present

## 2020-04-28 DIAGNOSIS — Z992 Dependence on renal dialysis: Secondary | ICD-10-CM | POA: Diagnosis not present

## 2020-04-28 DIAGNOSIS — N2581 Secondary hyperparathyroidism of renal origin: Secondary | ICD-10-CM | POA: Diagnosis not present

## 2020-04-28 DIAGNOSIS — N186 End stage renal disease: Secondary | ICD-10-CM | POA: Diagnosis not present

## 2020-04-28 DIAGNOSIS — I120 Hypertensive chronic kidney disease with stage 5 chronic kidney disease or end stage renal disease: Secondary | ICD-10-CM | POA: Diagnosis not present

## 2020-04-28 DIAGNOSIS — R61 Generalized hyperhidrosis: Secondary | ICD-10-CM | POA: Diagnosis not present

## 2020-04-28 DIAGNOSIS — Z23 Encounter for immunization: Secondary | ICD-10-CM | POA: Diagnosis not present

## 2020-04-30 DIAGNOSIS — Z23 Encounter for immunization: Secondary | ICD-10-CM | POA: Diagnosis not present

## 2020-04-30 DIAGNOSIS — N2581 Secondary hyperparathyroidism of renal origin: Secondary | ICD-10-CM | POA: Diagnosis not present

## 2020-04-30 DIAGNOSIS — N186 End stage renal disease: Secondary | ICD-10-CM | POA: Diagnosis not present

## 2020-04-30 DIAGNOSIS — R61 Generalized hyperhidrosis: Secondary | ICD-10-CM | POA: Diagnosis not present

## 2020-04-30 DIAGNOSIS — Z992 Dependence on renal dialysis: Secondary | ICD-10-CM | POA: Diagnosis not present

## 2020-05-03 DIAGNOSIS — R61 Generalized hyperhidrosis: Secondary | ICD-10-CM | POA: Diagnosis not present

## 2020-05-03 DIAGNOSIS — Z23 Encounter for immunization: Secondary | ICD-10-CM | POA: Diagnosis not present

## 2020-05-03 DIAGNOSIS — N2581 Secondary hyperparathyroidism of renal origin: Secondary | ICD-10-CM | POA: Diagnosis not present

## 2020-05-03 DIAGNOSIS — N186 End stage renal disease: Secondary | ICD-10-CM | POA: Diagnosis not present

## 2020-05-03 DIAGNOSIS — Z992 Dependence on renal dialysis: Secondary | ICD-10-CM | POA: Diagnosis not present

## 2020-05-05 DIAGNOSIS — N2581 Secondary hyperparathyroidism of renal origin: Secondary | ICD-10-CM | POA: Diagnosis not present

## 2020-05-05 DIAGNOSIS — Z23 Encounter for immunization: Secondary | ICD-10-CM | POA: Diagnosis not present

## 2020-05-05 DIAGNOSIS — N186 End stage renal disease: Secondary | ICD-10-CM | POA: Diagnosis not present

## 2020-05-05 DIAGNOSIS — Z992 Dependence on renal dialysis: Secondary | ICD-10-CM | POA: Diagnosis not present

## 2020-05-05 DIAGNOSIS — R61 Generalized hyperhidrosis: Secondary | ICD-10-CM | POA: Diagnosis not present

## 2020-05-07 DIAGNOSIS — R61 Generalized hyperhidrosis: Secondary | ICD-10-CM | POA: Diagnosis not present

## 2020-05-07 DIAGNOSIS — Z23 Encounter for immunization: Secondary | ICD-10-CM | POA: Diagnosis not present

## 2020-05-07 DIAGNOSIS — Z992 Dependence on renal dialysis: Secondary | ICD-10-CM | POA: Diagnosis not present

## 2020-05-07 DIAGNOSIS — N2581 Secondary hyperparathyroidism of renal origin: Secondary | ICD-10-CM | POA: Diagnosis not present

## 2020-05-07 DIAGNOSIS — N186 End stage renal disease: Secondary | ICD-10-CM | POA: Diagnosis not present

## 2020-05-10 DIAGNOSIS — N2581 Secondary hyperparathyroidism of renal origin: Secondary | ICD-10-CM | POA: Diagnosis not present

## 2020-05-10 DIAGNOSIS — N186 End stage renal disease: Secondary | ICD-10-CM | POA: Diagnosis not present

## 2020-05-10 DIAGNOSIS — Z23 Encounter for immunization: Secondary | ICD-10-CM | POA: Diagnosis not present

## 2020-05-10 DIAGNOSIS — R61 Generalized hyperhidrosis: Secondary | ICD-10-CM | POA: Diagnosis not present

## 2020-05-10 DIAGNOSIS — Z992 Dependence on renal dialysis: Secondary | ICD-10-CM | POA: Diagnosis not present

## 2020-05-12 DIAGNOSIS — Z992 Dependence on renal dialysis: Secondary | ICD-10-CM | POA: Diagnosis not present

## 2020-05-12 DIAGNOSIS — N186 End stage renal disease: Secondary | ICD-10-CM | POA: Diagnosis not present

## 2020-05-12 DIAGNOSIS — N2581 Secondary hyperparathyroidism of renal origin: Secondary | ICD-10-CM | POA: Diagnosis not present

## 2020-05-12 DIAGNOSIS — R61 Generalized hyperhidrosis: Secondary | ICD-10-CM | POA: Diagnosis not present

## 2020-05-12 DIAGNOSIS — Z23 Encounter for immunization: Secondary | ICD-10-CM | POA: Diagnosis not present

## 2020-05-14 DIAGNOSIS — R61 Generalized hyperhidrosis: Secondary | ICD-10-CM | POA: Diagnosis not present

## 2020-05-14 DIAGNOSIS — N186 End stage renal disease: Secondary | ICD-10-CM | POA: Diagnosis not present

## 2020-05-14 DIAGNOSIS — Z23 Encounter for immunization: Secondary | ICD-10-CM | POA: Diagnosis not present

## 2020-05-14 DIAGNOSIS — N2581 Secondary hyperparathyroidism of renal origin: Secondary | ICD-10-CM | POA: Diagnosis not present

## 2020-05-14 DIAGNOSIS — Z992 Dependence on renal dialysis: Secondary | ICD-10-CM | POA: Diagnosis not present

## 2020-05-17 DIAGNOSIS — Z992 Dependence on renal dialysis: Secondary | ICD-10-CM | POA: Diagnosis not present

## 2020-05-17 DIAGNOSIS — N2581 Secondary hyperparathyroidism of renal origin: Secondary | ICD-10-CM | POA: Diagnosis not present

## 2020-05-17 DIAGNOSIS — Z23 Encounter for immunization: Secondary | ICD-10-CM | POA: Diagnosis not present

## 2020-05-17 DIAGNOSIS — N186 End stage renal disease: Secondary | ICD-10-CM | POA: Diagnosis not present

## 2020-05-17 DIAGNOSIS — R61 Generalized hyperhidrosis: Secondary | ICD-10-CM | POA: Diagnosis not present

## 2020-05-18 DIAGNOSIS — M545 Low back pain: Secondary | ICD-10-CM | POA: Diagnosis not present

## 2020-05-19 DIAGNOSIS — R61 Generalized hyperhidrosis: Secondary | ICD-10-CM | POA: Diagnosis not present

## 2020-05-19 DIAGNOSIS — N2581 Secondary hyperparathyroidism of renal origin: Secondary | ICD-10-CM | POA: Diagnosis not present

## 2020-05-19 DIAGNOSIS — N186 End stage renal disease: Secondary | ICD-10-CM | POA: Diagnosis not present

## 2020-05-19 DIAGNOSIS — Z992 Dependence on renal dialysis: Secondary | ICD-10-CM | POA: Diagnosis not present

## 2020-05-19 DIAGNOSIS — Z23 Encounter for immunization: Secondary | ICD-10-CM | POA: Diagnosis not present

## 2020-05-21 DIAGNOSIS — Z992 Dependence on renal dialysis: Secondary | ICD-10-CM | POA: Diagnosis not present

## 2020-05-21 DIAGNOSIS — N2581 Secondary hyperparathyroidism of renal origin: Secondary | ICD-10-CM | POA: Diagnosis not present

## 2020-05-21 DIAGNOSIS — N186 End stage renal disease: Secondary | ICD-10-CM | POA: Diagnosis not present

## 2020-05-21 DIAGNOSIS — R61 Generalized hyperhidrosis: Secondary | ICD-10-CM | POA: Diagnosis not present

## 2020-05-21 DIAGNOSIS — Z23 Encounter for immunization: Secondary | ICD-10-CM | POA: Diagnosis not present

## 2020-05-24 DIAGNOSIS — Z992 Dependence on renal dialysis: Secondary | ICD-10-CM | POA: Diagnosis not present

## 2020-05-24 DIAGNOSIS — Z23 Encounter for immunization: Secondary | ICD-10-CM | POA: Diagnosis not present

## 2020-05-24 DIAGNOSIS — R61 Generalized hyperhidrosis: Secondary | ICD-10-CM | POA: Diagnosis not present

## 2020-05-24 DIAGNOSIS — N2581 Secondary hyperparathyroidism of renal origin: Secondary | ICD-10-CM | POA: Diagnosis not present

## 2020-05-24 DIAGNOSIS — N186 End stage renal disease: Secondary | ICD-10-CM | POA: Diagnosis not present

## 2020-05-26 DIAGNOSIS — R61 Generalized hyperhidrosis: Secondary | ICD-10-CM | POA: Diagnosis not present

## 2020-05-26 DIAGNOSIS — Z992 Dependence on renal dialysis: Secondary | ICD-10-CM | POA: Diagnosis not present

## 2020-05-26 DIAGNOSIS — N2581 Secondary hyperparathyroidism of renal origin: Secondary | ICD-10-CM | POA: Diagnosis not present

## 2020-05-26 DIAGNOSIS — Z23 Encounter for immunization: Secondary | ICD-10-CM | POA: Diagnosis not present

## 2020-05-26 DIAGNOSIS — N186 End stage renal disease: Secondary | ICD-10-CM | POA: Diagnosis not present

## 2020-05-28 DIAGNOSIS — Z992 Dependence on renal dialysis: Secondary | ICD-10-CM | POA: Diagnosis not present

## 2020-05-28 DIAGNOSIS — I120 Hypertensive chronic kidney disease with stage 5 chronic kidney disease or end stage renal disease: Secondary | ICD-10-CM | POA: Diagnosis not present

## 2020-05-28 DIAGNOSIS — T7840XD Allergy, unspecified, subsequent encounter: Secondary | ICD-10-CM | POA: Diagnosis not present

## 2020-05-28 DIAGNOSIS — Z8614 Personal history of Methicillin resistant Staphylococcus aureus infection: Secondary | ICD-10-CM | POA: Diagnosis not present

## 2020-05-28 DIAGNOSIS — R61 Generalized hyperhidrosis: Secondary | ICD-10-CM | POA: Diagnosis not present

## 2020-05-28 DIAGNOSIS — T782XXD Anaphylactic shock, unspecified, subsequent encounter: Secondary | ICD-10-CM | POA: Diagnosis not present

## 2020-05-28 DIAGNOSIS — N186 End stage renal disease: Secondary | ICD-10-CM | POA: Diagnosis not present

## 2020-05-28 DIAGNOSIS — N2581 Secondary hyperparathyroidism of renal origin: Secondary | ICD-10-CM | POA: Diagnosis not present

## 2020-05-31 DIAGNOSIS — N2581 Secondary hyperparathyroidism of renal origin: Secondary | ICD-10-CM | POA: Diagnosis not present

## 2020-05-31 DIAGNOSIS — N186 End stage renal disease: Secondary | ICD-10-CM | POA: Diagnosis not present

## 2020-05-31 DIAGNOSIS — Z992 Dependence on renal dialysis: Secondary | ICD-10-CM | POA: Diagnosis not present

## 2020-05-31 DIAGNOSIS — T7840XD Allergy, unspecified, subsequent encounter: Secondary | ICD-10-CM | POA: Diagnosis not present

## 2020-05-31 DIAGNOSIS — Z8614 Personal history of Methicillin resistant Staphylococcus aureus infection: Secondary | ICD-10-CM | POA: Diagnosis not present

## 2020-05-31 DIAGNOSIS — T782XXD Anaphylactic shock, unspecified, subsequent encounter: Secondary | ICD-10-CM | POA: Diagnosis not present

## 2020-06-02 DIAGNOSIS — N2581 Secondary hyperparathyroidism of renal origin: Secondary | ICD-10-CM | POA: Diagnosis not present

## 2020-06-02 DIAGNOSIS — Z8614 Personal history of Methicillin resistant Staphylococcus aureus infection: Secondary | ICD-10-CM | POA: Diagnosis not present

## 2020-06-02 DIAGNOSIS — T782XXD Anaphylactic shock, unspecified, subsequent encounter: Secondary | ICD-10-CM | POA: Diagnosis not present

## 2020-06-02 DIAGNOSIS — T7840XD Allergy, unspecified, subsequent encounter: Secondary | ICD-10-CM | POA: Diagnosis not present

## 2020-06-02 DIAGNOSIS — Z992 Dependence on renal dialysis: Secondary | ICD-10-CM | POA: Diagnosis not present

## 2020-06-02 DIAGNOSIS — N186 End stage renal disease: Secondary | ICD-10-CM | POA: Diagnosis not present

## 2020-06-02 DIAGNOSIS — R61 Generalized hyperhidrosis: Secondary | ICD-10-CM | POA: Diagnosis not present

## 2020-06-04 DIAGNOSIS — T7840XD Allergy, unspecified, subsequent encounter: Secondary | ICD-10-CM | POA: Diagnosis not present

## 2020-06-04 DIAGNOSIS — Z992 Dependence on renal dialysis: Secondary | ICD-10-CM | POA: Diagnosis not present

## 2020-06-04 DIAGNOSIS — T782XXD Anaphylactic shock, unspecified, subsequent encounter: Secondary | ICD-10-CM | POA: Diagnosis not present

## 2020-06-04 DIAGNOSIS — N2581 Secondary hyperparathyroidism of renal origin: Secondary | ICD-10-CM | POA: Diagnosis not present

## 2020-06-04 DIAGNOSIS — Z8614 Personal history of Methicillin resistant Staphylococcus aureus infection: Secondary | ICD-10-CM | POA: Diagnosis not present

## 2020-06-04 DIAGNOSIS — N186 End stage renal disease: Secondary | ICD-10-CM | POA: Diagnosis not present

## 2020-06-07 DIAGNOSIS — T7840XD Allergy, unspecified, subsequent encounter: Secondary | ICD-10-CM | POA: Diagnosis not present

## 2020-06-07 DIAGNOSIS — N2581 Secondary hyperparathyroidism of renal origin: Secondary | ICD-10-CM | POA: Diagnosis not present

## 2020-06-07 DIAGNOSIS — N186 End stage renal disease: Secondary | ICD-10-CM | POA: Diagnosis not present

## 2020-06-07 DIAGNOSIS — Z992 Dependence on renal dialysis: Secondary | ICD-10-CM | POA: Diagnosis not present

## 2020-06-07 DIAGNOSIS — T782XXD Anaphylactic shock, unspecified, subsequent encounter: Secondary | ICD-10-CM | POA: Diagnosis not present

## 2020-06-07 DIAGNOSIS — Z8614 Personal history of Methicillin resistant Staphylococcus aureus infection: Secondary | ICD-10-CM | POA: Diagnosis not present

## 2020-06-08 DIAGNOSIS — M546 Pain in thoracic spine: Secondary | ICD-10-CM | POA: Diagnosis not present

## 2020-06-09 DIAGNOSIS — N2581 Secondary hyperparathyroidism of renal origin: Secondary | ICD-10-CM | POA: Diagnosis not present

## 2020-06-09 DIAGNOSIS — N186 End stage renal disease: Secondary | ICD-10-CM | POA: Diagnosis not present

## 2020-06-09 DIAGNOSIS — Z992 Dependence on renal dialysis: Secondary | ICD-10-CM | POA: Diagnosis not present

## 2020-06-09 DIAGNOSIS — T7840XD Allergy, unspecified, subsequent encounter: Secondary | ICD-10-CM | POA: Diagnosis not present

## 2020-06-09 DIAGNOSIS — T782XXD Anaphylactic shock, unspecified, subsequent encounter: Secondary | ICD-10-CM | POA: Diagnosis not present

## 2020-06-09 DIAGNOSIS — Z8614 Personal history of Methicillin resistant Staphylococcus aureus infection: Secondary | ICD-10-CM | POA: Diagnosis not present

## 2020-06-11 DIAGNOSIS — T7840XD Allergy, unspecified, subsequent encounter: Secondary | ICD-10-CM | POA: Diagnosis not present

## 2020-06-11 DIAGNOSIS — T782XXD Anaphylactic shock, unspecified, subsequent encounter: Secondary | ICD-10-CM | POA: Diagnosis not present

## 2020-06-11 DIAGNOSIS — Z992 Dependence on renal dialysis: Secondary | ICD-10-CM | POA: Diagnosis not present

## 2020-06-11 DIAGNOSIS — N2581 Secondary hyperparathyroidism of renal origin: Secondary | ICD-10-CM | POA: Diagnosis not present

## 2020-06-11 DIAGNOSIS — Z8614 Personal history of Methicillin resistant Staphylococcus aureus infection: Secondary | ICD-10-CM | POA: Diagnosis not present

## 2020-06-11 DIAGNOSIS — N186 End stage renal disease: Secondary | ICD-10-CM | POA: Diagnosis not present

## 2020-06-14 DIAGNOSIS — T782XXD Anaphylactic shock, unspecified, subsequent encounter: Secondary | ICD-10-CM | POA: Diagnosis not present

## 2020-06-14 DIAGNOSIS — N2581 Secondary hyperparathyroidism of renal origin: Secondary | ICD-10-CM | POA: Diagnosis not present

## 2020-06-14 DIAGNOSIS — Z992 Dependence on renal dialysis: Secondary | ICD-10-CM | POA: Diagnosis not present

## 2020-06-14 DIAGNOSIS — T7840XD Allergy, unspecified, subsequent encounter: Secondary | ICD-10-CM | POA: Diagnosis not present

## 2020-06-14 DIAGNOSIS — Z8614 Personal history of Methicillin resistant Staphylococcus aureus infection: Secondary | ICD-10-CM | POA: Diagnosis not present

## 2020-06-14 DIAGNOSIS — N186 End stage renal disease: Secondary | ICD-10-CM | POA: Diagnosis not present

## 2020-06-15 ENCOUNTER — Other Ambulatory Visit: Payer: Self-pay

## 2020-06-15 ENCOUNTER — Encounter: Payer: Self-pay | Admitting: Infectious Diseases

## 2020-06-15 ENCOUNTER — Telehealth: Payer: Self-pay

## 2020-06-15 ENCOUNTER — Ambulatory Visit (INDEPENDENT_AMBULATORY_CARE_PROVIDER_SITE_OTHER): Payer: Medicare Other | Admitting: Infectious Diseases

## 2020-06-15 VITALS — BP 126/83 | HR 82 | Temp 97.7°F | Wt 227.0 lb

## 2020-06-15 DIAGNOSIS — R61 Generalized hyperhidrosis: Secondary | ICD-10-CM | POA: Insufficient documentation

## 2020-06-15 DIAGNOSIS — A4902 Methicillin resistant Staphylococcus aureus infection, unspecified site: Secondary | ICD-10-CM | POA: Diagnosis not present

## 2020-06-15 DIAGNOSIS — L03114 Cellulitis of left upper limb: Secondary | ICD-10-CM | POA: Diagnosis not present

## 2020-06-15 DIAGNOSIS — B353 Tinea pedis: Secondary | ICD-10-CM

## 2020-06-15 MED ORDER — TERBINAFINE HCL 1 % EX CREA: TOPICAL_CREAM | Freq: Every day | CUTANEOUS | Status: AC

## 2020-06-15 MED ORDER — CHLORHEXIDINE GLUCONATE 4 % EX LIQD: Freq: Every day | CUTANEOUS | Status: AC

## 2020-06-15 NOTE — Telephone Encounter (Signed)
Verbal order given to [facility] with [facility] Ph# [phone] to draw blood cultures on the patient and fax results to our office. Marco Arnold verbalized understanding and blood cultures will be drawn tomorrow . Marco Arnold Marco Arnold

## 2020-06-15 NOTE — Telephone Encounter (Signed)
-----   Message from Rosiland Oz, MD sent at 06/15/2020  1:56 PM EDT ----- Regarding: Call patient  Can you please let the patient know for MRSA decolonization, do the following.  1. Dilute bleach baths twice per week for 3 months . ( For bleach bath, add 1/4 -1/2 cup of common 5% household bleach to a bathtub full of water, around 40 gallons) 2. Chlorhexidine baths or showers# once daily on the days of the week that bleach baths are not given for 3 months  I told him about the bleach bath earlier but he said he does not use bath tub. But I think it is really important for him to do these measures to avoid recurrent Infections.  Thanks

## 2020-06-15 NOTE — Progress Notes (Addendum)
[facility]                                                             [place], [place], [place], [address]                                                                  Phn. [phone]; Fax: [phone]                                                                             Date: 06/15/20  Reason for Referral: chronic night sweats and recurrent skin infections  Referring Provider: Madelon Lips   Assessment 54 Year Old male with a PMHof ESRD on HD ( via LUA AV fistula), HTN, CAD, HLD, HTN, chronic back pain who is referred for evaluation of  1. Night sweats 2. Recurrent skin infections ( MRSA) 3. Chronic back pain r/o discitis/OM/epidural abscess 4. Left Forearm wound r/o deeper involvement of bone  5. 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 Tinea pedis   Plan Orders Placed This Encounter  Procedures  . DG Forearm Left  . Sedimentation rate  . C-reactive protein  . QuantiFERON-TB Gold Plus   -Follow up MRI spine. ( It was ordered by [facility] and scheduled in 06/2020) -My nurse Diminique has told his dialysis center to get a repeat blood cx at his next HD appointment. He had his last dose of clindamycin yesterday. -MRSA decolonization A) Dilute bleach baths twice per week for three months B) Chlorhexidine baths or showers once daily on the days of the week that bleach baths are not given for three months -Lamisil 1% cream for tinea pedis for 3 weeks   Follow up pending labs and MRI spine findings Follow up with pain management/Ortho/Nephrology   I spent greater than 60 minutes with the patient including greater than 50% of time in face to face counsel of the patient and in coordination of their care.    Rosiland Oz, MD [facility]  Office phone [phone] Fax no.  [phone] ______________________________________________________________________________________________________________________  HPI: 54 Year Old male with a PMHof ESRD on HD ( via LUA AV fistula), HTN, CAD, HLD, HTN, chronic back pain who is referred for evaluation of night sweats/recurrent skin infections.   Patient is not a good historian and would mostly give vague answers. He says he has been on dialysis since 2013 and doesnot make urine. He remembers having boils in his buttock periodically in his 62s. But has started having more frequent boils/skin infections in the last 3-4 years. He says it has involved almost al parts of his body including his scalp, nose, face, Bilateral upper extremities, lower extermities. He says he has completed a  7 days course of vancomycin with HD last week ( 3 doses with HD). He also says he also completed a course of clindamycin yesterday. He says the boils occur mostly in the winter months of the year. He feels his skin is dry and some what itchy at times. Denies any trauma.   Per Outside records 51/0258 MRSA folliculitis of nose; Amoxicillin and Doxycycline prescribed   07/2019 facial abscesses, CT was OK, received vancomycin and clindamycin  03/2020- Had a scalp abscess that bursted. Cx grew MRSA, Clindamycin prescribed 04/2020 another bump in his scalp - another course of clindamycin prescribed. Blood cx 9/20 NG 04/2020 another bump in scalp - already on abx.  05/2020  Vanc with HD for week for MRSA abscesses given no response to PO abx  Denies having frequent sinopulmonary infections in childhood. Denies any immunodeficiency disorders in his family. Denies having pets at home. Smokes cigarettes almost one pack per day and consumes marijuana whenever he can. Denies drinking alcohol. He lives in [place] with his father and is on disbility. He says he used to work in [employer] before he got disable in 2013 after needing dialysis.   Denies recent sick  contacts/TB contact,  recent travel out of the state   He says he has had night sweats for almost a year. However, per outside records he had night sweats for a month. He says his clothes and bed sheet soak due to sweating and it seems like someone has poured water on him. He denies any associated fever and chills. Denies any loss of appetite or loss of weight. Denies any foreign travel. Denies any sick contact or contact with TB. Denies any lumps/bumps   Has chronic back pain throughout his spine for more than 20 years including his shoulder blades and bilateral shoulder joints. He has seen chiropracter in the past, takes pain meds for it and recently started seeing orthopedics at [facility] who started him on spinal injections ( 2 so far). He says he is scheduled for MRI of his spine in November. He is also working with the Physical Therapist.   Denies any headache, blurry vision, ear pain. Denies cough, chest pain and SOB. Denies any nausea, vomiting, abdominal pain, diarrhea/constipation. Does not produce any urine.   ROS: 10 point ROS negative except as above   Past Medical History:  Diagnosis Date  . Arthritis    spine  . Chronic kidney disease    [place] MWF  . Coronary artery disease   . Dialysis patient [facility]) 2013  . Diverticulitis   . Hyperlipidemia   . Hypertension    dr  Tobie Lords      @ [facility]  . Neuromuscular disorder ([place])    nerve pain - tx w/cymbalta  . Neuropathic pain 09/20/2017   Left mid-thoracic   Past Surgical History:  Procedure Laterality Date  . AV FISTULA PLACEMENT  11/10/2011   Procedure: ARTERIOVENOUS (AV) FISTULA CREATION;  Surgeon: Mal Misty, MD;  Location: [place];  Service: Vascular;  Laterality: Left;  Ultrasound guided  . CARDIAC CATHETERIZATION  09/19/2018   On stent study at [facility]  . COLONOSCOPY    . INSERTION OF DIALYSIS CATHETER  02/09/2012   Procedure: INSERTION OF DIALYSIS CATHETER;  Surgeon: Mal Misty, MD;  Location: [place];  Service: Vascular;  Laterality: N/A;  . MASS EXCISION Right 09/07/2018   Procedure: EXCISION MASS RIGHT THUMB;  Surgeon: Charlotte Crumb, MD;  Location: [place];  Service:  Orthopedics;  Laterality: Right;  OR AXILLARY BLOCK  . REVISON OF ARTERIOVENOUS FISTULA Left 04/08/2019   Procedure: REVISION PLICATION OF LEFT ARM ARTERIOVENOUS FISTULA;  Surgeon: Serafina Mitchell, MD;  Location: [place];  Service: Vascular;  Laterality: Left;  . SHUNTOGRAM Left 06/03/2012   Procedure: SHUNTOGRAM;  Surgeon: Conrad Kirkwood, MD;  Location: [facility] CATH LAB;  Service: Cardiovascular;  Laterality: Left;  . SHUNTOGRAM Left 01/02/2013   Procedure: SHUNTOGRAM;  Surgeon: Conrad Spaulding, MD;  Location: [facility] CATH LAB;  Service: Cardiovascular;  Laterality: Left;   . Current Outpatient Medications on File Prior to Visit  Medication Sig Dispense Refill  . acetaminophen (TYLENOL 8 HOUR) 650 MG CR tablet Take 1 tablet (650 mg total) by mouth every 8 (eight) hours as needed for pain. 15 tablet 0  . aspirin EC 81 MG tablet Take 81 mg by mouth daily.    . cloNIDine (CATAPRES) 0.1 MG tablet Take 0.2 mg by mouth daily.    . cyclobenzaprine (FLEXERIL) 10 MG tablet Take 20 mg by mouth 2 (two) times daily as needed for muscle spasms.     Marland Kitchen FOSRENOL 1000 MG chewable tablet Chew 6,000 mg by mouth 3 (three) times daily with meals.   11  . hydrOXYzine (ATARAX/VISTARIL) 25 MG tablet Take 25 mg by mouth 2 (two) times daily as needed for itching (Sleep).     . multivitamin (RENA-VIT) TABS tablet Take 1 tablet by mouth See admin instructions. Every Monday, Wednesday and Friday    . naproxen sodium (ALEVE) 220 MG tablet Take 220 mg by mouth 2 (two) times daily as needed (pain).     . pantoprazole (PROTONIX) 40 MG tablet Take 40 mg by mouth daily.     . VENTOLIN HFA 108 (90 Base) MCG/ACT inhaler Inhale 2 puffs into the lungs every 6 (six) hours as needed for wheezing or shortness of breath.     Marland Kitchen amLODipine (NORVASC) 10 MG tablet Take 10 mg by mouth  at bedtime.  (Patient not taking: Reported on 06/15/2020)    . atorvastatin (LIPITOR) 80 MG tablet Take 80 mg by mouth daily.  (Patient not taking: Reported on 06/15/2020)    . carvedilol (COREG) 3.125 MG tablet Take 3.125 mg by mouth 2 (two) times daily with a meal.  (Patient not taking: Reported on 06/15/2020)    . clindamycin (CLEOCIN) 300 MG capsule Take 1 capsule (300 mg total) by mouth 4 (four) times daily. X 7 days (Patient not taking: Reported on 06/15/2020) 28 capsule 0  . DULoxetine (CYMBALTA) 60 MG capsule Take 60 mg by mouth every morning.  (Patient not taking: Reported on 06/15/2020)  2  . gabapentin (NEURONTIN) 100 MG capsule Take 3 capsules (300 mg total) by mouth at bedtime. (Patient not taking: Reported on 06/15/2020) 60 capsule 0  . HYDROcodone-acetaminophen (NORCO) 5-325 MG tablet Take 1 tablet by mouth every 6 (six) hours as needed for moderate pain. (Patient not taking: Reported on 06/15/2020) 15 tablet 0  . lidocaine (LIDODERM) 5 % Place 1 patch onto the skin daily as needed. Apply patch to area most significant pain once per day.  Remove and discard patch within 12 hours of application. 30 patch 0  . nortriptyline (PAMELOR) 25 MG capsule Take 25 mg by mouth every morning.  (Patient not taking: Reported on 06/15/2020)  2   No current facility-administered medications on file prior to visit.   Allergies  Allergen Reactions  . Lisinopril Shortness Of Breath    Unable  to breath   Social History   Socioeconomic History  . Marital status: Divorced    Spouse name: Not on file  . Number of children: 2  . Years of education: 68  . Highest education level: Not on file  Occupational History  . Occupation: disabled  Tobacco Use  . Smoking status: Light Tobacco Smoker    Packs/day: 0.25    Years: 43.00    Pack years: 10.75    Types: Cigarettes  . Smokeless tobacco: Never Used  . Tobacco comment: pt states that he is trying to quit--is trying to cut back at the current time   Vaping Use  . Vaping Use: Former  . Quit date: 08/28/2018  Substance and Sexual Activity  . Alcohol use: Yes    Comment: occasional  . Drug use: Yes    Types: Marijuana  . Sexual activity: Not on file  Other Topics Concern  . Not on file  Social History Narrative   Lives with father   Caffeine use: none   Right handed    Social Determinants of Health   Financial Resource Strain:   . Difficulty of Paying Living Expenses: Not on file  Food Insecurity:   . Worried About Charity fundraiser in the Last Year: Not on file  . Ran Out of Food in the Last Year: Not on file  Transportation Needs:   . Lack of Transportation (Medical): Not on file  . Lack of Transportation (Non-Medical): Not on file  Physical Activity:   . Days of Exercise per Week: Not on file  . Minutes of Exercise per Session: Not on file  Stress:   . Feeling of Stress : Not on file  Social Connections:   . Frequency of Communication with Friends and Family: Not on file  . Frequency of Social Gatherings with Friends and Family: Not on file  . Attends Religious Services: Not on file  . Active Member of Clubs or Organizations: Not on file  . Attends Archivist Meetings: Not on file  . Marital Status: Not on file  Intimate Partner Violence:   . Fear of Current or Ex-Partner: Not on file  . Emotionally Abused: Not on file  . Physically Abused: Not on file  . Sexually Abused: Not on file     Vitals BP 126/83   Pulse 82   Temp 97.7 F (36.5 C) (Oral)   Wt 227 lb (103 kg)   BMI 28.00 kg/m '  Examination  General - not in acute distress, comfortably sitting in chair HEENT - PEERLA, no pallor and no icterus, MULTIPLE DENTAL CAVITIES  Chest - b/l clear air entry, no additional sounds CVS- Normal W2B7, RRR, SYSTOLIC FLOW MURMUR ( ? TRANSMITTED FROM AV FISTULA ) Abdomen - Soft, Non tender , non distended Ext- no pedal edema, LUE AV FISTULA - HAS A THRILL Neuro: grossly normal Back - MINIMAL  TENDERNESS THROUGHOUT THE SPINE+     Psych : calm and cooperative Skin:      Recent labs CBC Latest Ref Rng & Units 03/22/2020 05/27/2019 05/26/2019  WBC 4.0 - 10.5 K/uL 11.2(H) 7.8 -  Hemoglobin 13.0 - 17.0 g/dL 12.5(L) 10.6(L) 12.6(L)  Hematocrit 39 - 52 % 39.4 33.1(L) 37.0(L)  Platelets 150 - 400 K/uL 233 173 -   CMP Latest Ref Rng & Units 03/23/2020 03/22/2020 05/27/2019  Glucose 70 - 99 mg/dL - 107(H) -  BUN 6 - 20 mg/dL - 34(H) -  Creatinine 0.61 - 1.24  mg/dL - 7.68(H) -  Sodium 135 - 145 mmol/L - 135 -  Potassium 3.5 - 5.1 mmol/L - 3.6 5.6(H)  Chloride 98 - 111 mmol/L - 91(L) -  CO2 22 - 32 mmol/L - 26 -  Calcium 8.9 - 10.3 mg/dL - 8.7(L) -  Total Protein 6.5 - 8.1 g/dL 8.2(H) - -  Total Bilirubin 0.3 - 1.2 mg/dL 1.1 - -  Alkaline Phos 38 - 126 U/L 112 - -  AST 15 - 41 U/L 31 - -  ALT 0 - 44 U/L 27 - -   06/02/20  WBC 9.5, hb 13.6, , platelets 156, , TSG 1.7, HBsag negative, HCV PCR quantitative negative, HIV ag/anb screen negative, TSH 1.7  Pertinent Microbiology OSH blood cx 9/20 NG per external medical records    All pertinent labs/Imagings/notes reviewed. All pertinent plain films and CT images have been personally visualized and interpreted; radiology reports have been reviewed. Decision making incorporated into the Impression / Recommendations.

## 2020-06-15 NOTE — Assessment & Plan Note (Signed)
Quantiferon Follow up MRI spine ordered vby [facility] Blood cx at his next HD at the dialysis center ESR and CRP  

## 2020-06-15 NOTE — Telephone Encounter (Signed)
Called patient and left a voicemail for him to callback regarding Dr. West Bali instructions for MRSA decolonization.  Sharisa Toves T Brooks Sailors

## 2020-06-16 DIAGNOSIS — Z8614 Personal history of Methicillin resistant Staphylococcus aureus infection: Secondary | ICD-10-CM | POA: Diagnosis not present

## 2020-06-16 DIAGNOSIS — N2581 Secondary hyperparathyroidism of renal origin: Secondary | ICD-10-CM | POA: Diagnosis not present

## 2020-06-16 DIAGNOSIS — Z992 Dependence on renal dialysis: Secondary | ICD-10-CM | POA: Diagnosis not present

## 2020-06-16 DIAGNOSIS — T7840XD Allergy, unspecified, subsequent encounter: Secondary | ICD-10-CM | POA: Diagnosis not present

## 2020-06-16 DIAGNOSIS — N186 End stage renal disease: Secondary | ICD-10-CM | POA: Diagnosis not present

## 2020-06-16 DIAGNOSIS — T782XXD Anaphylactic shock, unspecified, subsequent encounter: Secondary | ICD-10-CM | POA: Diagnosis not present

## 2020-06-16 NOTE — Telephone Encounter (Addendum)
Pt informed of directions. Patient states he would do the Chlorhexidine shower, but states he would not be able to do the bleach baths due to his house being an older house and prefers not to use the tub. Marco Arnold

## 2020-06-17 LAB — SEDIMENTATION RATE: Sed Rate: 2 mm/h (ref 0–20)

## 2020-06-17 LAB — QUANTIFERON-TB GOLD PLUS
Mitogen-NIL: 8.87 IU/mL
NIL: 0.02 IU/mL
QuantiFERON-TB Gold Plus: NEGATIVE
TB1-NIL: 0 IU/mL
TB2-NIL: 0 IU/mL

## 2020-06-17 LAB — C-REACTIVE PROTEIN: CRP: 13.4 mg/L — ABNORMAL HIGH (ref <8.0)

## 2020-06-18 DIAGNOSIS — Z8614 Personal history of Methicillin resistant Staphylococcus aureus infection: Secondary | ICD-10-CM | POA: Diagnosis not present

## 2020-06-18 DIAGNOSIS — T7840XD Allergy, unspecified, subsequent encounter: Secondary | ICD-10-CM | POA: Diagnosis not present

## 2020-06-18 DIAGNOSIS — T782XXD Anaphylactic shock, unspecified, subsequent encounter: Secondary | ICD-10-CM | POA: Diagnosis not present

## 2020-06-18 DIAGNOSIS — N2581 Secondary hyperparathyroidism of renal origin: Secondary | ICD-10-CM | POA: Diagnosis not present

## 2020-06-18 DIAGNOSIS — Z992 Dependence on renal dialysis: Secondary | ICD-10-CM | POA: Diagnosis not present

## 2020-06-18 DIAGNOSIS — N186 End stage renal disease: Secondary | ICD-10-CM | POA: Diagnosis not present

## 2020-06-19 DIAGNOSIS — E8779 Other fluid overload: Secondary | ICD-10-CM | POA: Diagnosis not present

## 2020-06-19 DIAGNOSIS — Z992 Dependence on renal dialysis: Secondary | ICD-10-CM | POA: Diagnosis not present

## 2020-06-19 DIAGNOSIS — N186 End stage renal disease: Secondary | ICD-10-CM | POA: Diagnosis not present

## 2020-06-19 DIAGNOSIS — N2581 Secondary hyperparathyroidism of renal origin: Secondary | ICD-10-CM | POA: Diagnosis not present

## 2020-06-21 DIAGNOSIS — Z992 Dependence on renal dialysis: Secondary | ICD-10-CM | POA: Diagnosis not present

## 2020-06-21 DIAGNOSIS — Z8614 Personal history of Methicillin resistant Staphylococcus aureus infection: Secondary | ICD-10-CM | POA: Diagnosis not present

## 2020-06-21 DIAGNOSIS — T782XXD Anaphylactic shock, unspecified, subsequent encounter: Secondary | ICD-10-CM | POA: Diagnosis not present

## 2020-06-21 DIAGNOSIS — N2581 Secondary hyperparathyroidism of renal origin: Secondary | ICD-10-CM | POA: Diagnosis not present

## 2020-06-21 DIAGNOSIS — N186 End stage renal disease: Secondary | ICD-10-CM | POA: Diagnosis not present

## 2020-06-21 DIAGNOSIS — T7840XD Allergy, unspecified, subsequent encounter: Secondary | ICD-10-CM | POA: Diagnosis not present

## 2020-06-22 DIAGNOSIS — N186 End stage renal disease: Secondary | ICD-10-CM | POA: Diagnosis not present

## 2020-06-22 DIAGNOSIS — I871 Compression of vein: Secondary | ICD-10-CM | POA: Diagnosis not present

## 2020-06-22 DIAGNOSIS — Z992 Dependence on renal dialysis: Secondary | ICD-10-CM | POA: Diagnosis not present

## 2020-06-23 DIAGNOSIS — N2581 Secondary hyperparathyroidism of renal origin: Secondary | ICD-10-CM | POA: Diagnosis not present

## 2020-06-23 DIAGNOSIS — T7840XD Allergy, unspecified, subsequent encounter: Secondary | ICD-10-CM | POA: Diagnosis not present

## 2020-06-23 DIAGNOSIS — T782XXD Anaphylactic shock, unspecified, subsequent encounter: Secondary | ICD-10-CM | POA: Diagnosis not present

## 2020-06-23 DIAGNOSIS — N186 End stage renal disease: Secondary | ICD-10-CM | POA: Diagnosis not present

## 2020-06-23 DIAGNOSIS — Z8614 Personal history of Methicillin resistant Staphylococcus aureus infection: Secondary | ICD-10-CM | POA: Diagnosis not present

## 2020-06-23 DIAGNOSIS — Z992 Dependence on renal dialysis: Secondary | ICD-10-CM | POA: Diagnosis not present

## 2020-06-25 DIAGNOSIS — N186 End stage renal disease: Secondary | ICD-10-CM | POA: Diagnosis not present

## 2020-06-25 DIAGNOSIS — Z8614 Personal history of Methicillin resistant Staphylococcus aureus infection: Secondary | ICD-10-CM | POA: Diagnosis not present

## 2020-06-25 DIAGNOSIS — T7840XD Allergy, unspecified, subsequent encounter: Secondary | ICD-10-CM | POA: Diagnosis not present

## 2020-06-25 DIAGNOSIS — T782XXD Anaphylactic shock, unspecified, subsequent encounter: Secondary | ICD-10-CM | POA: Diagnosis not present

## 2020-06-25 DIAGNOSIS — N2581 Secondary hyperparathyroidism of renal origin: Secondary | ICD-10-CM | POA: Diagnosis not present

## 2020-06-25 DIAGNOSIS — Z992 Dependence on renal dialysis: Secondary | ICD-10-CM | POA: Diagnosis not present

## 2020-06-27 ENCOUNTER — Emergency Department (HOSPITAL_BASED_OUTPATIENT_CLINIC_OR_DEPARTMENT_OTHER): Payer: Medicare Other

## 2020-06-27 ENCOUNTER — Encounter (HOSPITAL_COMMUNITY): Payer: Self-pay | Admitting: Emergency Medicine

## 2020-06-27 ENCOUNTER — Emergency Department (HOSPITAL_COMMUNITY): Payer: Medicare Other

## 2020-06-27 ENCOUNTER — Other Ambulatory Visit: Payer: Self-pay

## 2020-06-27 ENCOUNTER — Inpatient Hospital Stay (HOSPITAL_COMMUNITY)
Admission: EM | Admit: 2020-06-27 | Discharge: 2020-07-03 | DRG: 280 | Disposition: A | Discharge status: Home / Self Care | Payer: Medicare Other | Attending: Family Medicine | Admitting: Family Medicine

## 2020-06-27 DIAGNOSIS — I420 Dilated cardiomyopathy: Secondary | ICD-10-CM

## 2020-06-27 DIAGNOSIS — G8929 Other chronic pain: Secondary | ICD-10-CM | POA: Diagnosis present

## 2020-06-27 DIAGNOSIS — E162 Hypoglycemia, unspecified: Secondary | ICD-10-CM | POA: Diagnosis not present

## 2020-06-27 DIAGNOSIS — Z20822 Contact with and (suspected) exposure to covid-19: Secondary | ICD-10-CM | POA: Diagnosis not present

## 2020-06-27 DIAGNOSIS — R079 Chest pain, unspecified: Secondary | ICD-10-CM | POA: Diagnosis not present

## 2020-06-27 DIAGNOSIS — Z7982 Long term (current) use of aspirin: Secondary | ICD-10-CM

## 2020-06-27 DIAGNOSIS — I25110 Atherosclerotic heart disease of native coronary artery with unstable angina pectoris: Secondary | ICD-10-CM | POA: Diagnosis present

## 2020-06-27 DIAGNOSIS — N2581 Secondary hyperparathyroidism of renal origin: Secondary | ICD-10-CM | POA: Diagnosis present

## 2020-06-27 DIAGNOSIS — M79671 Pain in right foot: Secondary | ICD-10-CM

## 2020-06-27 DIAGNOSIS — I442 Atrioventricular block, complete: Secondary | ICD-10-CM | POA: Diagnosis present

## 2020-06-27 DIAGNOSIS — I200 Unstable angina: Secondary | ICD-10-CM | POA: Diagnosis not present

## 2020-06-27 DIAGNOSIS — L089 Local infection of the skin and subcutaneous tissue, unspecified: Secondary | ICD-10-CM | POA: Diagnosis present

## 2020-06-27 DIAGNOSIS — B9562 Methicillin resistant Staphylococcus aureus infection as the cause of diseases classified elsewhere: Secondary | ICD-10-CM | POA: Diagnosis present

## 2020-06-27 DIAGNOSIS — R0602 Shortness of breath: Secondary | ICD-10-CM | POA: Diagnosis not present

## 2020-06-27 DIAGNOSIS — Q211 Atrial septal defect: Secondary | ICD-10-CM | POA: Diagnosis not present

## 2020-06-27 DIAGNOSIS — R9431 Abnormal electrocardiogram [ECG] [EKG]: Secondary | ICD-10-CM

## 2020-06-27 DIAGNOSIS — E871 Hypo-osmolality and hyponatremia: Secondary | ICD-10-CM | POA: Diagnosis present

## 2020-06-27 DIAGNOSIS — I252 Old myocardial infarction: Secondary | ICD-10-CM

## 2020-06-27 DIAGNOSIS — E875 Hyperkalemia: Secondary | ICD-10-CM | POA: Diagnosis present

## 2020-06-27 DIAGNOSIS — Z515 Encounter for palliative care: Secondary | ICD-10-CM

## 2020-06-27 DIAGNOSIS — I340 Nonrheumatic mitral (valve) insufficiency: Secondary | ICD-10-CM | POA: Diagnosis not present

## 2020-06-27 DIAGNOSIS — I132 Hypertensive heart and chronic kidney disease with heart failure and with stage 5 chronic kidney disease, or end stage renal disease: Secondary | ICD-10-CM | POA: Diagnosis not present

## 2020-06-27 DIAGNOSIS — Z22322 Carrier or suspected carrier of Methicillin resistant Staphylococcus aureus: Secondary | ICD-10-CM

## 2020-06-27 DIAGNOSIS — Z79899 Other long term (current) drug therapy: Secondary | ICD-10-CM

## 2020-06-27 DIAGNOSIS — N186 End stage renal disease: Secondary | ICD-10-CM | POA: Diagnosis not present

## 2020-06-27 DIAGNOSIS — Z888 Allergy status to other drugs, medicaments and biological substances status: Secondary | ICD-10-CM

## 2020-06-27 DIAGNOSIS — Z8249 Family history of ischemic heart disease and other diseases of the circulatory system: Secondary | ICD-10-CM

## 2020-06-27 DIAGNOSIS — I2510 Atherosclerotic heart disease of native coronary artery without angina pectoris: Secondary | ICD-10-CM

## 2020-06-27 DIAGNOSIS — Z7189 Other specified counseling: Secondary | ICD-10-CM

## 2020-06-27 DIAGNOSIS — I350 Nonrheumatic aortic (valve) stenosis: Secondary | ICD-10-CM | POA: Diagnosis not present

## 2020-06-27 DIAGNOSIS — E8889 Other specified metabolic disorders: Secondary | ICD-10-CM | POA: Diagnosis present

## 2020-06-27 DIAGNOSIS — Z833 Family history of diabetes mellitus: Secondary | ICD-10-CM

## 2020-06-27 DIAGNOSIS — I214 Non-ST elevation (NSTEMI) myocardial infarction: Secondary | ICD-10-CM | POA: Diagnosis present

## 2020-06-27 DIAGNOSIS — D696 Thrombocytopenia, unspecified: Secondary | ICD-10-CM | POA: Diagnosis present

## 2020-06-27 DIAGNOSIS — R0789 Other chest pain: Secondary | ICD-10-CM | POA: Diagnosis not present

## 2020-06-27 DIAGNOSIS — I2583 Coronary atherosclerosis due to lipid rich plaque: Secondary | ICD-10-CM | POA: Diagnosis not present

## 2020-06-27 DIAGNOSIS — R7303 Prediabetes: Secondary | ICD-10-CM

## 2020-06-27 DIAGNOSIS — G629 Polyneuropathy, unspecified: Secondary | ICD-10-CM | POA: Diagnosis present

## 2020-06-27 DIAGNOSIS — I700 Atherosclerosis of aorta: Secondary | ICD-10-CM | POA: Diagnosis present

## 2020-06-27 DIAGNOSIS — I5082 Biventricular heart failure: Secondary | ICD-10-CM | POA: Diagnosis present

## 2020-06-27 DIAGNOSIS — F17210 Nicotine dependence, cigarettes, uncomplicated: Secondary | ICD-10-CM | POA: Diagnosis present

## 2020-06-27 DIAGNOSIS — I083 Combined rheumatic disorders of mitral, aortic and tricuspid valves: Secondary | ICD-10-CM | POA: Diagnosis present

## 2020-06-27 DIAGNOSIS — Z992 Dependence on renal dialysis: Secondary | ICD-10-CM

## 2020-06-27 DIAGNOSIS — I5043 Acute on chronic combined systolic (congestive) and diastolic (congestive) heart failure: Secondary | ICD-10-CM

## 2020-06-27 DIAGNOSIS — M2142 Flat foot [pes planus] (acquired), left foot: Secondary | ICD-10-CM | POA: Diagnosis present

## 2020-06-27 DIAGNOSIS — I4510 Unspecified right bundle-branch block: Secondary | ICD-10-CM | POA: Diagnosis present

## 2020-06-27 DIAGNOSIS — R21 Rash and other nonspecific skin eruption: Secondary | ICD-10-CM | POA: Diagnosis present

## 2020-06-27 DIAGNOSIS — E785 Hyperlipidemia, unspecified: Secondary | ICD-10-CM | POA: Diagnosis present

## 2020-06-27 DIAGNOSIS — M19072 Primary osteoarthritis, left ankle and foot: Secondary | ICD-10-CM | POA: Diagnosis present

## 2020-06-27 DIAGNOSIS — M545 Low back pain, unspecified: Secondary | ICD-10-CM | POA: Diagnosis present

## 2020-06-27 DIAGNOSIS — K219 Gastro-esophageal reflux disease without esophagitis: Secondary | ICD-10-CM | POA: Diagnosis present

## 2020-06-27 DIAGNOSIS — I429 Cardiomyopathy, unspecified: Secondary | ICD-10-CM | POA: Diagnosis present

## 2020-06-27 DIAGNOSIS — R739 Hyperglycemia, unspecified: Secondary | ICD-10-CM | POA: Diagnosis not present

## 2020-06-27 DIAGNOSIS — Z955 Presence of coronary angioplasty implant and graft: Secondary | ICD-10-CM

## 2020-06-27 DIAGNOSIS — M205X2 Other deformities of toe(s) (acquired), left foot: Secondary | ICD-10-CM | POA: Diagnosis present

## 2020-06-27 DIAGNOSIS — Z8614 Personal history of Methicillin resistant Staphylococcus aureus infection: Secondary | ICD-10-CM

## 2020-06-27 LAB — ECHOCARDIOGRAM COMPLETE
AR max vel: 0.74 cm2
AV Area VTI: 0.73 cm2
AV Area mean vel: 0.67 cm2
AV Mean grad: 15 mmHg
AV Peak grad: 33.4 mmHg
Ao pk vel: 2.89 m/s
Height: 75.5 in
MV M vel: 5.34 m/s
MV Peak grad: 114.1 mmHg
S' Lateral: 6.1 cm
Weight: 3680 oz

## 2020-06-27 LAB — TROPONIN I (HIGH SENSITIVITY)
Troponin I (High Sensitivity): 257 ng/L (ref <18)
Troponin I (High Sensitivity): 279 ng/L (ref <18)
Troponin I (High Sensitivity): 263 ng/L (ref <18)
Troponin I (High Sensitivity): 302 ng/L (ref <18)

## 2020-06-27 LAB — CBC
HCT: 43.3 % (ref 39.0–52.0)
Hemoglobin: 13.8 g/dL (ref 13.0–17.0)
MCH: 27.5 pg (ref 26.0–34.0)
MCHC: 31.9 g/dL (ref 30.0–36.0)
MCV: 86.4 fL (ref 80.0–100.0)
Platelets: 202 10*3/uL (ref 150–400)
RBC: 5.01 MIL/uL (ref 4.22–5.81)
RDW: 16.1 % — ABNORMAL HIGH (ref 11.5–15.5)
WBC: 9.3 10*3/uL (ref 4.0–10.5)
nRBC: 0 % (ref 0.0–0.2)

## 2020-06-27 LAB — RESPIRATORY PANEL BY RT PCR (FLU A&B, COVID)
Influenza A by PCR: NEGATIVE
Influenza B by PCR: NEGATIVE
SARS Coronavirus 2 by RT PCR: NEGATIVE

## 2020-06-27 LAB — BASIC METABOLIC PANEL
Anion gap: 25 — ABNORMAL HIGH (ref 5–15)
BUN: 81 mg/dL — ABNORMAL HIGH (ref 6–20)
CO2: 21 mmol/L — ABNORMAL LOW (ref 22–32)
Calcium: 9.1 mg/dL (ref 8.9–10.3)
Chloride: 84 mmol/L — ABNORMAL LOW (ref 98–111)
Creatinine, Ser: 9.09 mg/dL — ABNORMAL HIGH (ref 0.61–1.24)
GFR, Estimated: 6 mL/min — ABNORMAL LOW (ref >60)
Glucose, Bld: 101 mg/dL — ABNORMAL HIGH (ref 70–99)
Potassium: 5 mmol/L (ref 3.5–5.1)
Sodium: 130 mmol/L — ABNORMAL LOW (ref 135–145)

## 2020-06-27 LAB — HIV ANTIBODY (ROUTINE TESTING W REFLEX): HIV Screen 4th Generation wRfx: NONREACTIVE

## 2020-06-27 MED ORDER — PANTOPRAZOLE SODIUM 40 MG PO TBEC
40.0000 mg | DELAYED_RELEASE_TABLET | Freq: Every day | ORAL | Status: DC
  Administered 2020-06-28 – 2020-07-03 (×6): 40 mg via ORAL
  Filled 2020-06-27 (×6): qty 1

## 2020-06-27 MED ORDER — HYDROMORPHONE HCL 2 MG PO TABS
1.0000 mg | ORAL_TABLET | Freq: Once | ORAL | Status: AC
  Administered 2020-06-27: 1 mg via ORAL
  Filled 2020-06-27: qty 1

## 2020-06-27 MED ORDER — ASPIRIN EC 81 MG PO TBEC
81.0000 mg | DELAYED_RELEASE_TABLET | Freq: Every day | ORAL | Status: DC
  Filled 2020-06-27: qty 1

## 2020-06-27 MED ORDER — ATORVASTATIN CALCIUM 40 MG PO TABS: 80.0000 mg | ORAL_TABLET | Freq: Every day | ORAL | Status: DC

## 2020-06-27 MED ORDER — CLONIDINE HCL 0.2 MG PO TABS
0.2000 mg | ORAL_TABLET | Freq: Every day | ORAL | Status: DC
  Administered 2020-06-28 – 2020-06-29 (×2): 0.2 mg via ORAL
  Filled 2020-06-27 (×2): qty 1

## 2020-06-27 MED ORDER — SODIUM CHLORIDE 0.9% FLUSH: 3.0000 mL | INTRAVENOUS | Status: DC | PRN

## 2020-06-27 MED ORDER — ASPIRIN EC 81 MG PO TBEC: 81.0000 mg | DELAYED_RELEASE_TABLET | Freq: Every day | ORAL | Status: DC

## 2020-06-27 MED ORDER — CYCLOBENZAPRINE HCL 10 MG PO TABS: 20.0000 mg | ORAL_TABLET | Freq: Two times a day (BID) | ORAL | Status: DC | PRN

## 2020-06-27 MED ORDER — ACETAMINOPHEN 325 MG PO TABS
650.0000 mg | ORAL_TABLET | Freq: Four times a day (QID) | ORAL | Status: DC | PRN
  Administered 2020-06-27: 650 mg via ORAL
  Filled 2020-06-27: qty 2

## 2020-06-27 MED ORDER — PERFLUTREN LIPID MICROSPHERE
1.0000 mL | INTRAVENOUS | Status: AC | PRN
  Administered 2020-06-27: 2 mL via INTRAVENOUS
  Filled 2020-06-27: qty 10

## 2020-06-27 MED ORDER — ALBUTEROL SULFATE HFA 108 (90 BASE) MCG/ACT IN AERS
2.0000 | INHALATION_SPRAY | Freq: Four times a day (QID) | RESPIRATORY_TRACT | Status: DC | PRN
  Filled 2020-06-27: qty 6.7

## 2020-06-27 MED ORDER — ENOXAPARIN SODIUM 40 MG/0.4ML ~~LOC~~ SOLN: 40.0000 mg | SUBCUTANEOUS | Status: DC

## 2020-06-27 MED ORDER — HEPARIN BOLUS VIA INFUSION
4000.0000 [IU] | Freq: Once | INTRAVENOUS | Status: AC
  Administered 2020-06-27: 4000 [IU] via INTRAVENOUS
  Filled 2020-06-27: qty 4000

## 2020-06-27 MED ORDER — ASPIRIN 81 MG PO CHEW
81.0000 mg | CHEWABLE_TABLET | ORAL | Status: AC
  Administered 2020-06-28: 81 mg via ORAL
  Filled 2020-06-27: qty 1

## 2020-06-27 MED ORDER — POLYETHYLENE GLYCOL 3350 17 G PO PACK
17.0000 g | PACK | Freq: Every day | ORAL | Status: DC | PRN
  Administered 2020-06-29: 17 g via ORAL
  Filled 2020-06-27: qty 1

## 2020-06-27 MED ORDER — SODIUM CHLORIDE 0.9 % IV SOLN: INTRAVENOUS | Status: DC

## 2020-06-27 MED ORDER — RENA-VITE PO TABS
1.0000 | ORAL_TABLET | ORAL | Status: DC
  Administered 2020-06-28 – 2020-07-02 (×4): 1 via ORAL
  Filled 2020-06-27 (×4): qty 1

## 2020-06-27 MED ORDER — OXYCODONE HCL 5 MG PO TABS: 5.0000 mg | ORAL_TABLET | Freq: Once | ORAL | Status: DC

## 2020-06-27 MED ORDER — HEPARIN (PORCINE) 25000 UT/250ML-% IV SOLN
1550.0000 [IU]/h | INTRAVENOUS | Status: DC
  Administered 2020-06-27: 1400 [IU]/h via INTRAVENOUS
  Administered 2020-06-28: 1550 [IU]/h via INTRAVENOUS
  Filled 2020-06-27 (×2): qty 250

## 2020-06-27 MED ORDER — ACETAMINOPHEN 650 MG RE SUPP: 650.0000 mg | Freq: Four times a day (QID) | RECTAL | Status: DC | PRN

## 2020-06-27 MED ORDER — ASPIRIN EC 325 MG PO TBEC
325.0000 mg | DELAYED_RELEASE_TABLET | Freq: Once | ORAL | Status: AC
  Administered 2020-06-27: 325 mg via ORAL
  Filled 2020-06-27: qty 1

## 2020-06-27 MED ORDER — ATORVASTATIN CALCIUM 80 MG PO TABS
80.0000 mg | ORAL_TABLET | Freq: Every day | ORAL | Status: DC
  Filled 2020-06-27: qty 1

## 2020-06-27 MED ORDER — DICLOFENAC SODIUM 1 % EX GEL
4.0000 g | Freq: Four times a day (QID) | CUTANEOUS | Status: DC | PRN
  Administered 2020-06-27 – 2020-07-02 (×10): 4 g via TOPICAL
  Filled 2020-06-27 (×2): qty 100

## 2020-06-27 MED ORDER — SODIUM CHLORIDE 0.9% FLUSH
3.0000 mL | Freq: Two times a day (BID) | INTRAVENOUS | Status: DC
  Administered 2020-06-27 – 2020-07-02 (×6): 3 mL via INTRAVENOUS

## 2020-06-27 MED ORDER — LIDOCAINE 5 % EX PTCH: 1.0000 | MEDICATED_PATCH | Freq: Every day | CUTANEOUS | Status: DC | PRN

## 2020-06-27 MED ORDER — HYDROXYZINE HCL 25 MG PO TABS
25.0000 mg | ORAL_TABLET | Freq: Two times a day (BID) | ORAL | Status: DC | PRN
  Administered 2020-06-28 – 2020-06-30 (×3): 25 mg via ORAL
  Filled 2020-06-27 (×3): qty 1

## 2020-06-27 MED ORDER — NICOTINE 21 MG/24HR TD PT24
21.0000 mg | MEDICATED_PATCH | Freq: Every day | TRANSDERMAL | Status: DC
  Filled 2020-06-27: qty 1

## 2020-06-27 MED ORDER — HEPARIN SODIUM (PORCINE) 5000 UNIT/ML IJ SOLN: 5000.0000 [IU] | Freq: Three times a day (TID) | INTRAMUSCULAR | Status: DC

## 2020-06-27 MED ORDER — SODIUM CHLORIDE 0.9 % IV SOLN: 250.0000 mL | INTRAVENOUS | Status: DC | PRN

## 2020-06-27 MED ORDER — LANTHANUM CARBONATE 500 MG PO CHEW
4000.0000 mg | CHEWABLE_TABLET | Freq: Three times a day (TID) | ORAL | Status: DC
  Administered 2020-06-28 – 2020-07-03 (×10): 4000 mg via ORAL
  Filled 2020-06-27 (×19): qty 8

## 2020-06-27 NOTE — Progress Notes (Signed)
*  PRELIMINARY RESULTS* Echocardiogram 2D Echocardiogram with contrast has been performed.  Merrie Roof F 06/27/2020, 3:59 PM

## 2020-06-27 NOTE — ED Provider Notes (Signed)
[facility] EMERGENCY DEPARTMENT Provider Note   CSN: [id number] Arrival date & time: 06/27/20  1156     History Chief Complaint  Patient presents with  . Chest Pain    Marco Arnold is a 54 y.o. male.  HPI Patient is a 54 year old male with a history of ESRD on HD Monday/Wednesday/Friday, hypertension, CAD, who presents to the emergency department due to chest tightness and shortness of breath that started at 4 AM today.  Patient reports associated diaphoresis.  States his pain is nonradiating and located on the anterior portion of his chest.  No history of similar symptoms.  He smokes 1/2 pack/day.  No nausea or vomiting.  No abdominal pain, urinary changes, syncope.    Past Medical History:  Diagnosis Date  . Arthritis    spine  . Chronic kidney disease    [place] MWF  . Coronary artery disease   . Dialysis patient [facility]) 2013  . Diverticulitis   . Hyperlipidemia   . Hypertension    dr  Tobie Lords      @ [facility]  med  . Neuromuscular disorder ([place])    nerve pain - tx w/cymbalta  . Neuropathic pain 09/20/2017   Left mid-thoracic    Patient Active Problem List   Diagnosis Date Noted  . Chronic night sweats 06/15/2020  . Tinea pedis 06/15/2020  . MRSA (methicillin resistant Staphylococcus aureus) infection 06/15/2020  . Hyperkalemia 05/26/2019  . Hyponatremia 05/26/2019  . CAD (coronary artery disease) 05/26/2019  . Complete heart block, transient ([place]) 05/26/2019  . Neuropathic pain 09/20/2017  . Thoracic disc herniation 03/29/2017  . Chronic bilateral low back pain without sciatica 03/29/2017  . Other sleep disturbances 12/05/2012  . End stage renal disease ([place]) 12/05/2011  . ESRD (end stage renal disease) ([place]) 11/11/2011  . Anemia of chronic disease 11/11/2011  . Hyperparathyroidism, secondary renal ([place]) 11/11/2011  . HTN (hypertension) 11/11/2011  . Acute kidney injury ([place]) 11/06/2011    Past Surgical History:  Procedure  Laterality Date  . AV FISTULA PLACEMENT  11/10/2011   Procedure: ARTERIOVENOUS (AV) FISTULA CREATION;  Surgeon: Mal Misty, MD;  Location: [facility];  Service: Vascular;  Laterality: Left;  Ultrasound guided  . CARDIAC CATHETERIZATION  09/19/2018   On stent study at [facility]  . COLONOSCOPY    . INSERTION OF DIALYSIS CATHETER  02/09/2012   Procedure: INSERTION OF DIALYSIS CATHETER;  Surgeon: Mal Misty, MD;  Location: [place];  Service: Vascular;  Laterality: N/A;  . MASS EXCISION Right 09/07/2018   Procedure: EXCISION MASS RIGHT THUMB;  Surgeon: Charlotte Crumb, MD;  Location: [place];  Service: Orthopedics;  Laterality: Right;  OR AXILLARY BLOCK  . REVISON OF ARTERIOVENOUS FISTULA Left 04/08/2019   Procedure: REVISION PLICATION OF LEFT ARM ARTERIOVENOUS FISTULA;  Surgeon: Serafina Mitchell, MD;  Location: [place];  Service: Vascular;  Laterality: Left;  . SHUNTOGRAM Left 06/03/2012   Procedure: SHUNTOGRAM;  Surgeon: Conrad [place] Point, MD;  Location: [facility] CATH LAB;  Service: Cardiovascular;  Laterality: Left;  . SHUNTOGRAM Left 01/02/2013   Procedure: SHUNTOGRAM;  Surgeon: Conrad Carmichael, MD;  Location: [facility] CATH LAB;  Service: Cardiovascular;  Laterality: Left;       Family History  Problem Relation Age of Onset  . Pancreatic cancer Mother   . Diabetes Father   . Hypertension Father   . Cancer - Lung Paternal Grandfather     Social History   Tobacco Use  . Smoking status: Light  Tobacco Smoker    Packs/day: 0.25    Years: 43.00    Pack years: 10.75    Types: Cigarettes  . Smokeless tobacco: Never Used  . Tobacco comment: pt states that he is trying to quit--is trying to cut back at the current time  Vaping Use  . Vaping Use: Former  . Quit date: 08/28/2018  Substance Use Topics  . Alcohol use: Yes    Comment: occasional  . Drug use: Yes    Types: Marijuana    Home Medications Prior to Admission medications   Medication Sig Start Date End Date Taking? Authorizing Provider  acetaminophen  (TYLENOL 8 HOUR) 650 MG CR tablet Take 1 tablet (650 mg total) by mouth every 8 (eight) hours as needed for pain. 03/23/20   Petrucelli, Samantha R, PA-C  amLODipine (NORVASC) 10 MG tablet Take 10 mg by mouth at bedtime.  Patient not taking: Reported on 06/15/2020    [provider]  aspirin EC 81 MG tablet Take 81 mg by mouth daily.    [provider]  atorvastatin (LIPITOR) 80 MG tablet Take 80 mg by mouth daily.  Patient not taking: Reported on 06/15/2020 01/16/19   [provider]  carvedilol (COREG) 3.125 MG tablet Take 3.125 mg by mouth 2 (two) times daily with a meal.  Patient not taking: Reported on 06/15/2020 01/16/19   [provider]  cloNIDine (CATAPRES) 0.1 MG tablet Take 0.2 mg by mouth daily. 07/13/16   [provider]  cyclobenzaprine (FLEXERIL) 10 MG tablet Take 20 mg by mouth 2 (two) times daily as needed for muscle spasms.     [provider]  DULoxetine (CYMBALTA) 60 MG capsule Take 60 mg by mouth every morning.  Patient not taking: Reported on 06/15/2020 01/29/18   [provider]  FOSRENOL 1000 MG chewable tablet Chew 6,000 mg by mouth 3 (three) times daily with meals.  08/31/17   [provider]  gabapentin (NEURONTIN) 100 MG capsule Take 3 capsules (300 mg total) by mouth at bedtime. Patient not taking: Reported on 06/15/2020 09/20/17   Kathrynn Ducking, MD  HYDROcodone-acetaminophen [facility]) 5-325 MG tablet Take 1 tablet by mouth every 6 (six) hours as needed for moderate pain. Patient not taking: Reported on 06/15/2020 04/08/19   Dagoberto Ligas, PA-C  hydrOXYzine (ATARAX/VISTARIL) 25 MG tablet Take 25 mg by mouth 2 (two) times daily as needed for itching (Sleep).     [provider]  lidocaine (LIDODERM) 5 % Place 1 patch onto the skin daily as needed. Apply patch to area most significant pain once per day.  Remove and discard patch within 12 hours of application. 03/23/20   Petrucelli, Samantha R,  PA-C  multivitamin (RENA-VIT) TABS tablet Take 1 tablet by mouth See admin instructions. Every Monday, Wednesday and Friday    [provider]  naproxen sodium (ALEVE) 220 MG tablet Take 220 mg by mouth 2 (two) times daily as needed (pain).     [provider]  nortriptyline (PAMELOR) 25 MG capsule Take 25 mg by mouth every morning.  Patient not taking: Reported on 06/15/2020 01/29/18   [provider]  pantoprazole (PROTONIX) 40 MG tablet Take 40 mg by mouth daily.  12/04/12   [provider]  VENTOLIN HFA 108 (90 Base) MCG/ACT inhaler Inhale 2 puffs into the lungs every 6 (six) hours as needed for wheezing or shortness of breath.  12/21/17   [provider]    Allergies    Lisinopril  Review of Systems   Review of Systems  All other systems reviewed and are negative. Ten systems reviewed and are negative for acute change, except as noted in the HPI.    Physical Exam Updated Vital Signs BP (!) 130/91   Pulse 87   Temp 97.8 F (36.6 C) (Oral)   Resp 18   Ht 6' 3.5" (1.918 m)   Wt 104.3 kg   SpO2 99%   BMI 28.37 kg/m   Physical Exam Vitals and nursing note reviewed.  Constitutional:      General: He is not in acute distress.    Appearance: Normal appearance. He is well-developed. He is not ill-appearing, toxic-appearing or diaphoretic.     Comments: Well-developed adult male sitting upright.  Coughing intermittently throughout the exam.  HENT:     Head: Normocephalic and atraumatic.     Right Ear: External ear normal.     Left Ear: External ear normal.     Nose: Nose normal.     Mouth/Throat:     Mouth: Mucous membranes are moist.     Pharynx: Oropharynx is clear. No oropharyngeal exudate or posterior oropharyngeal erythema.  Eyes:     Extraocular Movements: Extraocular movements intact.  Cardiovascular:     Rate and Rhythm: Normal rate and regular rhythm.     Pulses: Normal pulses.     Heart sounds: Murmur heard.  No friction  rub. No gallop.   Pulmonary:     Effort: Pulmonary effort is normal. No tachypnea, accessory muscle usage or respiratory distress.     Breath sounds: No stridor. Examination of the right-middle field reveals wheezing. Examination of the left-middle field reveals wheezing. Examination of the right-lower field reveals wheezing. Examination of the left-lower field reveals wheezing. Wheezing present. No decreased breath sounds, rhonchi or rales.     Comments: Expiratory wheezes noted bilaterally. Abdominal:     General: Abdomen is flat.     Palpations: Abdomen is soft.     Tenderness: There is no abdominal tenderness.  Musculoskeletal:        General: Normal range of motion.     Cervical back: Normal range of motion and neck supple. No tenderness.     Right lower leg: No tenderness. Edema present.     Left lower leg: No tenderness. Edema present.     Comments: Diffuse healing scabs noted across all 4 extremities.  1+ pitting edema noted in the bilateral lower extremities.  Feet are purpuric and cool to the touch, right greater than left.  Dopplerable DP pulses noted bilaterally.  Skin:    General: Skin is warm and dry.  Neurological:     General: No focal deficit present.     Mental Status: He is alert and oriented to person, place, and time.  Psychiatric:        Mood and Affect: Mood normal.        Behavior: Behavior normal.    ED Results / Procedures / Treatments   Labs (all labs ordered are listed, but only abnormal results are displayed) Labs Reviewed  BASIC METABOLIC PANEL - Abnormal; Notable for the following components:      Result Value   Sodium 130 (*)    Chloride 84 (*)    CO2 21 (*)    Glucose, Bld 101 (*)    BUN 81 (*)    Creatinine, Ser 9.09 (*)    GFR, Estimated 6 (*)    Anion gap 25 (*)    All other  components within normal limits  CBC - Abnormal; Notable for the following components:   RDW 16.1 (*)    All other components within normal limits  TROPONIN I (HIGH  SENSITIVITY) - Abnormal; Notable for the following components:   Troponin I (High Sensitivity) 257 (*)    All other components within normal limits  TROPONIN I (HIGH SENSITIVITY) - Abnormal; Notable for the following components:   Troponin I (High Sensitivity) 279 (*)    All other components within normal limits  RESPIRATORY PANEL BY RT PCR (FLU A&B, COVID)   EKG EKG Interpretation  Date/Time:  Sunday June 27 2020 14:02:22 EDT Ventricular Rate:  85 PR Interval:  224 QRS Duration: 181 QT Interval:  457 QTC Calculation: 544 R Axis:   -58 Text Interpretation: Sinus rhythm Prolonged PR interval IVCD, consider atypical RBBB LVH with IVCD, LAD and secondary repol abnrm Prolonged QT interval no significant change since earlier in the day and Sept 2020 Confirmed by Sherwood Gambler [phone]) on 06/27/2020 2:07:30 PM  Radiology DG Chest 2 View  Result Date: 06/27/2020 CLINICAL DATA:  Chest pain and shortness of breath. EXAM: CHEST - 2 VIEW COMPARISON:  March 23, 2020 FINDINGS: Enlarged cardiac silhouette. Mediastinal contours appear intact. Calcific atherosclerotic disease of the aorta. There is no evidence of focal airspace consolidation, pleural effusion or pneumothorax. Osseous structures are without acute abnormality. Soft tissues are grossly normal. IMPRESSION: 1. Enlarged cardiac silhouette. 2. Calcific atherosclerotic disease of the aorta. 3. No focal airspace consolidation. Electronically Signed   By: Fidela Salisbury M.D.   On: 06/27/2020 12:45   Procedures Procedures (including critical care time)  Medications Ordered in ED Medications  aspirin EC tablet 325 mg (325 mg Oral Given 06/27/20 1435)   ED Course  I have reviewed the triage vital signs and the nursing notes.  Pertinent labs & imaging results that were available during my care of the patient were reviewed by me and considered in my medical decision making (see chart for details).  Clinical Course as of Jun 27 1544    Sun Jun 27, 2020  1350 Troponin I (High Sensitivity)(!!): 257 [LJ]  1431 I spoke to Dr. Radford Pax with cardiology who recommended a second troponin.  Stat echo.  Admit to medicine.    [LJ]  1504 IMPRESSION: 1. Enlarged cardiac silhouette. 2. Calcific atherosclerotic disease of the aorta. 3. No focal airspace consolidation.  DG Chest 2 View [LJ]  1509 Troponin I (High Sensitivity)(!!): 279 [LJ]    Clinical Course User Index [LJ] Rayna Sexton, PA-C   MDM Rules/Calculators/A&P                          Pt is a 55 y.o. male that presents with a history, physical exam, and ED Clinical Course as noted above.   Patient presents today with chest tightness and shortness of breath that started about 12 hours ago.  It has been constant.  Denies a history of similar symptoms.  He is a Monday, Wednesday, Friday, dialysis patient.  He states he had a normal dialysis session on Friday, which was 2 days ago.  Physical exam significant for generally disheveled appearance with scabs noted across all 4 extremities.  Both feet are cool to the touch with dopplerable DP pulses.  Expiratory wheezes noted bilaterally.  Patient smokes 1/2 pack/day.  Trace edema noted in the lower extremities.  Patient was discussed with Dr. Radford Pax with cardiology after his initial troponin at 257.  She recommended delta troponin which has resulted at 279 as well as a stat echocardiogram and admission to medicine.  Both she and my attending physician feel that his ECG is similar to priors.   I discussed patient with medicine team who will accept him into their care at this time.  Echocardiogram is in process.  Dr. Radford Pax states that she will review the imaging and discuss with the medicine team.  COVID-19 test has been ordered.  Note: Portions of this report may have been transcribed using voice recognition software. Every effort was made to ensure accuracy; however, inadvertent computerized transcription errors may be present.    Final Clinical Impression(s) / ED Diagnoses Final diagnoses:  Chest pain, unspecified type  Shortness of breath   Rx / DC Orders ED Discharge Orders    None       Rayna Sexton, PA-C 06/27/20 [place], MD 06/27/20 1727

## 2020-06-27 NOTE — H&P (Addendum)
[facility] Admission History and Physical Service Pager: [phone]  Patient name: Marco Arnold Medical record number: [id number] Date of birth: 07/11/1966 Age: 54 y.o. Gender: male  Primary Care Provider: Cyndi Bender, PA-C Consultants: Cardiology Code Status: FULL  Chief Complaint: Chest tightness  Assessment and Plan: Marco Arnold is a 54 y.o. male smoker presenting with chest tightness and dyspnea likely due to CHF vs ACS. PMH is significant for ESRD, CAD s/p PCI of LAD (06/2019), HTN, AS, chronic low back pain.  Chest tightness  CHF vs ACS Patient presented with chest tightness and dyspnea starting early this morning in the setting of bilateral lower extremity edema over the past few weeks.  VSS on presentation without oxygen requirement. Troponin elevated 257, then 279. No ST changes on EKG, largely unchanged from previous studies. Has RBBB. CXR showing enlarged cardiac silhouette and calcific atherosclerotic disease of the aorta. Cardiology consulted in the ED, recommending trending troponin and obtaining stat TTE, now resulted with EF 20-25% and LV global hypokinesis. Suspect CHF given clinical findings, cardiology also considering ACS (stent restenosis of LAD) given worsening LV function. Planning for cardiac catheterization in the morning. - admit to FPTS, med-tele, Dr. Thompson Grayer attending - cardiology following, appreciate involvement - cardiac catheterization planned 11/1 - heparin gtt per cardiology - continue ASA 81 mg - continue atorvastatin 80 mg - holding carvedilol in setting of acute CHF - trend troponin - AM labs: renal function panel, CBC - vitals per unit routine - cardiac monitoring - daily weights  [facility] MWF HD. ESRD secondary to hypertensive kidney disease. Patient stated he did not want to have dialysis done in the hospital because he had a bad experience with inpatient dialysis previously.  K of 5.0 w/ Cr 9.09 and BUN 101. Next  scheduled outpatient dialysis session 11/1 at 630am. He is too uncomfortable receiving HD while in the hospital bed due to his chronic back pain. - nephrology consulted, appreciate involvement - continue Rena-Vit, lanthanum  HTN BP 130/91 on admission. Home meds: amlodipine 10 mg, carvedilol 3.125 mg BID, clonidine 0.2 mg daily. Reported not taking amlodipine or carvedilol prior to admission. - continuing clonidine to avoid rebound HTN - holding other home meds, BP normotensive to soft  Chronic low back pain Patient states he has severe chronic back pain and degenerative disc disease.  He states he has tried several medications and has been to pain clinics but the only thing that helps him is "horse Liniments". Home meds: cyclobenzaprine, naproxen prn.  - continue cyclobenzaprine - tylenol PRN for pain  - horse liniments not on formulary  GERD On pantoprazole 40 mg daily. - continue PPI  Tobacco use disorder Smokes about 1ppd. Started smoking when he was 54 yo.   - nicotine patch 21mg   Night sweats Given the patient's extensive smoking history, there is some concern his complaints of night sweats over the past few months could be due to a malignancy.  - consider further workup outpatient.    FEN/GI: renal diet Prophylaxis: heparin gtt  Disposition: med-tele  History of Present Illness:  Marco Arnold is a 54 y.o. male presenting with chest tightness and dyspnea.  Patient worsened around 4:00 AM this morning, he began having dyspnea and chest tightness. Also has a worsening cough. He also reports bilateral leg swelling that started a few weeks ago after receiving IV antibiotics at dialysis.  Has chronic back pain followed by pain clinic. Recently switched doctors after they put him on  antidepressants. Puts horse linament on his back for pain.     Patient endorses night sweats for the past few months.  Denies weight loss. Per chart review, patient had TB testing outpatient which  was negative.  Pt has 3 stents in his chest, DJD, arthritis, aortic valve stenosis.   Compliant with dialysis.  MWF.    Has albuterol but hasn't been filled, due to issues with the pharmacy pharmacy per patient. Takes hydroxyzine twice a day every day   PCP Cyndi Bender, PA-C.       Review Of Systems: Per HPI with the following additions:   ROS  Patient Active Problem List   Diagnosis Date Noted  . Chest pain 06/27/2020  . Chronic night sweats 06/15/2020  . Tinea pedis 06/15/2020  . MRSA (methicillin resistant Staphylococcus aureus) infection 06/15/2020  . Hyperkalemia 05/26/2019  . Hyponatremia 05/26/2019  . CAD (coronary artery disease) 05/26/2019  . Complete heart block, transient ([place]) 05/26/2019  . Neuropathic pain 09/20/2017  . Thoracic disc herniation 03/29/2017  . Chronic bilateral low back pain without sciatica 03/29/2017  . Other sleep disturbances 12/05/2012  . End stage renal disease ([place]) 12/05/2011  . ESRD (end stage renal disease) ([place]) 11/11/2011  . Anemia of chronic disease 11/11/2011  . Hyperparathyroidism, secondary renal ([place]) 11/11/2011  . HTN (hypertension) 11/11/2011  . Acute kidney injury ([place]) 11/06/2011    Past Medical History: Past Medical History:  Diagnosis Date  . Arthritis    spine  . Chronic kidney disease    [place] MWF  . Coronary artery disease   . Dialysis patient [facility]) 2013  . Diverticulitis   . Hyperlipidemia   . Hypertension    dr  Tobie Lords      @ [facility]  med  . Neuromuscular disorder ([place])    nerve pain - tx w/cymbalta  . Neuropathic pain 09/20/2017   Left mid-thoracic    Past Surgical History: Past Surgical History:  Procedure Laterality Date  . AV FISTULA PLACEMENT  11/10/2011   Procedure: ARTERIOVENOUS (AV) FISTULA CREATION;  Surgeon: Mal Misty, MD;  Location: [place];  Service: Vascular;  Laterality: Left;  Ultrasound guided  . CARDIAC CATHETERIZATION  09/19/2018   On stent study at [facility]  . COLONOSCOPY     . INSERTION OF DIALYSIS CATHETER  02/09/2012   Procedure: INSERTION OF DIALYSIS CATHETER;  Surgeon: Mal Misty, MD;  Location: [place];  Service: Vascular;  Laterality: N/A;  . MASS EXCISION Right 09/07/2018   Procedure: EXCISION MASS RIGHT THUMB;  Surgeon: Charlotte Crumb, MD;  Location: [place];  Service: Orthopedics;  Laterality: Right;  OR AXILLARY BLOCK  . REVISON OF ARTERIOVENOUS FISTULA Left 04/08/2019   Procedure: REVISION PLICATION OF LEFT ARM ARTERIOVENOUS FISTULA;  Surgeon: Serafina Mitchell, MD;  Location: [place];  Service: Vascular;  Laterality: Left;  . SHUNTOGRAM Left 06/03/2012   Procedure: SHUNTOGRAM;  Surgeon: Conrad [place], MD;  Location: [facility] CATH LAB;  Service: Cardiovascular;  Laterality: Left;  . SHUNTOGRAM Left 01/02/2013   Procedure: SHUNTOGRAM;  Surgeon: Conrad Weston, MD;  Location: [facility] CATH LAB;  Service: Cardiovascular;  Laterality: Left;    Social History: Social History   Tobacco Use  . Smoking status: Light Tobacco Smoker    Packs/day: 0.25    Years: 43.00    Pack years: 10.75    Types: Cigarettes  . Smokeless tobacco: Never Used  . Tobacco comment: pt states that he is trying to quit--is trying to cut back  at the current time  Vaping Use  . Vaping Use: Former  . Quit date: 08/28/2018  Substance Use Topics  . Alcohol use: Yes    Comment: occasional  . Drug use: Yes    Types: Marijuana   Additional social history: 0.5 ppd smoker since the age of 24.  Please also refer to relevant sections of EMR.  Family History: Family History  Problem Relation Age of Onset  . Pancreatic cancer Mother   . Diabetes Father   . Hypertension Father   . Cancer - Lung Paternal Grandfather     Allergies and Medications: Allergies  Allergen Reactions  . Lisinopril Shortness Of Breath    Unable to breath   Current Facility-Administered Medications on File Prior to Encounter  Medication Dose Route Frequency Provider Last Rate Last Admin  . chlorhexidine (HIBICLENS) 4 %  liquid   Topical Daily Rosiland Oz, MD      . terbinafine (LAMISIL) 1 % cream   Topical Daily Rosiland Oz, MD       Current Outpatient Medications on File Prior to Encounter  Medication Sig Dispense Refill  . acetaminophen (TYLENOL 8 HOUR) 650 MG CR tablet Take 1 tablet (650 mg total) by mouth every 8 (eight) hours as needed for pain. 15 tablet 0  . aspirin EC 81 MG tablet Take 81 mg by mouth daily.    . cloNIDine (CATAPRES) 0.1 MG tablet Take 0.2 mg by mouth daily.    . cyclobenzaprine (FLEXERIL) 10 MG tablet Take 20 mg by mouth 2 (two) times daily as needed for muscle spasms.     Marland Kitchen FOSRENOL 1000 MG chewable tablet Chew 6,000 mg by mouth 3 (three) times daily with meals.   11  . hydrOXYzine (ATARAX/VISTARIL) 25 MG tablet Take 25 mg by mouth 2 (two) times daily as needed for anxiety or itching (Sleep).     Marland Kitchen lidocaine (LIDODERM) 5 % Place 1 patch onto the skin daily as needed. Apply patch to area most significant pain once per day.  Remove and discard patch within 12 hours of application. (Patient taking differently: Place 1 patch onto the skin daily as needed (Apply patch to area most significant pain once per day.  Remove and discard patch within 12 hours of application.). ) 30 patch 0  . multivitamin (RENA-VIT) TABS tablet Take 1 tablet by mouth See admin instructions. Every Monday, Wednesday and Friday    . naproxen sodium (ALEVE) 220 MG tablet Take 220 mg by mouth 2 (two) times daily as needed (pain).     . pantoprazole (PROTONIX) 40 MG tablet Take 40 mg by mouth daily.     . VENTOLIN HFA 108 (90 Base) MCG/ACT inhaler Inhale 2 puffs into the lungs every 6 (six) hours as needed for wheezing or shortness of breath.     Marland Kitchen amLODipine (NORVASC) 10 MG tablet Take 10 mg by mouth at bedtime.  (Patient not taking: Reported on 06/15/2020)    . atorvastatin (LIPITOR) 80 MG tablet Take 80 mg by mouth daily.  (Patient not taking: Reported on 06/15/2020)    . carvedilol (COREG) 3.125 MG tablet  Take 3.125 mg by mouth 2 (two) times daily with a meal.  (Patient not taking: Reported on 06/15/2020)    . DULoxetine (CYMBALTA) 60 MG capsule Take 60 mg by mouth every morning.  (Patient not taking: Reported on 06/15/2020)  2  . gabapentin (NEURONTIN) 100 MG capsule Take 3 capsules (300 mg total) by mouth at bedtime. (Patient not taking:  Reported on 06/15/2020) 60 capsule 0  . HYDROcodone-acetaminophen (NORCO) 5-325 MG tablet Take 1 tablet by mouth every 6 (six) hours as needed for moderate pain. (Patient not taking: Reported on 06/15/2020) 15 tablet 0  . nortriptyline (PAMELOR) 25 MG capsule Take 25 mg by mouth every morning.  (Patient not taking: Reported on 06/15/2020)  2    Objective: BP 96/70   Pulse 83   Temp 97.8 F (36.6 C) (Oral)   Resp 13   Ht 6' 3.5" (1.918 m)   Wt 104.3 kg   SpO2 100%   BMI 28.37 kg/m  Exam: General: Middle-aged man sitting up in bed, NAD Eyes: EOMI Neck: supple Cardiovascular: RRR, no murmurs Respiratory: CTAB Gastrointestinal: soft, non-tender MSK: moves all extremities, 1+ pedal edema Derm: warm, dry, scattered chronic healed abrasions Neuro: alert, conversant Psych: irritated  Labs and Imaging: CBC BMET  Recent Labs  Lab 06/27/20 1234  WBC 9.3  HGB 13.8  HCT 43.3  PLT 202   Recent Labs  Lab 06/27/20 1234  NA 130*  K 5.0  CL 84*  CO2 21*  BUN 81*  CREATININE 9.09*  GLUCOSE 101*  CALCIUM 9.1     DG Chest 2 View  Result Date: 06/27/2020 CLINICAL DATA:  Chest pain and shortness of breath. EXAM: CHEST - 2 VIEW COMPARISON:  March 23, 2020 FINDINGS: Enlarged cardiac silhouette. Mediastinal contours appear intact. Calcific atherosclerotic disease of the aorta. There is no evidence of focal airspace consolidation, pleural effusion or pneumothorax. Osseous structures are without acute abnormality. Soft tissues are grossly normal. IMPRESSION: 1. Enlarged cardiac silhouette. 2. Calcific atherosclerotic disease of the aorta. 3. No focal  airspace consolidation. Electronically Signed   By: Fidela Salisbury M.D.   On: 06/27/2020 12:45   ECHOCARDIOGRAM COMPLETE  Result Date: 06/27/2020    ECHOCARDIOGRAM REPORT   Patient Name:   Marco Arnold Date of Exam: 06/27/2020 Medical Rec #:  [id number]     Height:       75.5 in Accession #:    [id number]    Weight:       230.0 lb Date of Birth:  08-04-1966     BSA:          2.340 m Patient Age:    85 years      BP:           153/97 mmHg Patient Gender: M             HR:           84 bpm. Exam Location:  Inpatient Procedure: 2D Echo, Cardiac Doppler and Color Doppler Indications:    R94.31 Abnormal EKG  History:        Patient has no prior history of Echocardiogram examinations.                 CAD; Risk Factors:Hypertension.  Sonographer:    Merrie Roof RDCS Referring Phys: [id number] [place]  1. Left ventricular ejection fraction, by estimation, is 25 to 30%. The left ventricle has severely decreased function. The left ventricle demonstrates global hypokinesis. The left ventricular internal cavity size was mildly dilated. Left ventricular diastolic parameters are indeterminate.  2. Right ventricular systolic function is moderately reduced. The right ventricular size is moderately enlarged. There is mildly elevated pulmonary artery systolic pressure. The estimated right ventricular systolic pressure is 03.8 mmHg.  3. Left atrial size was moderately dilated.  4. Right atrial size was moderately dilated.  5. The mitral valve  is degenerative. Moderate mitral valve regurgitation. No evidence of mitral stenosis.  6. Tricuspid valve regurgitation is severe.  7. Low flow, low gradient aortic stenosis. DI is 0.20. The aortic valve is tricuspid. There is severe calcifcation of the aortic valve. There is severe thickening of the aortic valve. Aortic valve regurgitation is not visualized. Moderate to severe aortic valve stenosis. Aortic valve area, by VTI measures 0.73 cm. Aortic valve mean  gradient measures 15.0 mmHg. Aortic valve Vmax measures 2.89 m/s.  8. The inferior vena cava is dilated in size with >50% respiratory variability, suggesting right atrial pressure of 8 mmHg. FINDINGS  Left Ventricle: Left ventricular ejection fraction, by estimation, is 25 to 30%. The left ventricle has severely decreased function. The left ventricle demonstrates global hypokinesis. The left ventricular internal cavity size was mildly dilated. There is no left ventricular hypertrophy. Left ventricular diastolic parameters are indeterminate.  LV Wall Scoring: The anterior septum, inferior wall, mid inferolateral segment, mid inferoseptal segment, and basal inferoseptal segment are akinetic. Right Ventricle: The right ventricular size is moderately enlarged. No increase in right ventricular wall thickness. Right ventricular systolic function is moderately reduced. There is mildly elevated pulmonary artery systolic pressure. The tricuspid regurgitant velocity is 2.53 m/s, and with an assumed right atrial pressure of 8 mmHg, the estimated right ventricular systolic pressure is 28.4 mmHg. Left Atrium: Left atrial size was moderately dilated. Right Atrium: Right atrial size was moderately dilated. Pericardium: There is no evidence of pericardial effusion. Mitral Valve: The mitral valve is degenerative in appearance. Moderate mitral valve regurgitation. No evidence of mitral valve stenosis. Tricuspid Valve: The tricuspid valve is normal in structure. Tricuspid valve regurgitation is severe. No evidence of tricuspid stenosis. Aortic Valve: Low flow, low gradient aortic stenosis. DI is 0.20. The aortic valve is tricuspid. There is severe calcifcation of the aortic valve. There is severe thickening of the aortic valve. Aortic valve regurgitation is not visualized. Moderate to severe aortic stenosis is present. Aortic valve mean gradient measures 15.0 mmHg. Aortic valve peak gradient measures 33.4 mmHg. Aortic valve area, by  VTI measures 0.73 cm. Pulmonic Valve: The pulmonic valve was normal in structure. Pulmonic valve regurgitation is not visualized. No evidence of pulmonic stenosis. Aorta: The aortic root is normal in size and structure. Venous: The inferior vena cava is dilated in size with greater than 50% respiratory variability, suggesting right atrial pressure of 8 mmHg. IAS/Shunts: No atrial level shunt detected by color flow Doppler.  LEFT VENTRICLE PLAX 2D LVIDd:         6.80 cm LVIDs:         6.10 cm LV PW:         1.20 cm LV IVS:        1.10 cm LVOT diam:     2.10 cm LV SV:         32 LV SV Index:   14 LVOT Area:     3.46 cm  RIGHT VENTRICLE RV Basal diam:  5.80 cm RV Mid diam:    5.50 cm RV S prime:     8.41 cm/s TAPSE (M-mode): 2.3 cm LEFT ATRIUM              Index       RIGHT ATRIUM           Index LA diam:        5.10 cm  2.18 cm/m  RA Area:     28.20 cm LA Vol (A2C):   108.0  ml 46.15 ml/m RA Volume:   108.00 ml 46.15 ml/m LA Vol (A4C):   114.0 ml 48.71 ml/m LA Biplane Vol: 115.0 ml 49.14 ml/m  AORTIC VALVE AV Area (Vmax):    0.74 cm AV Area (Vmean):   0.67 cm AV Area (VTI):     0.73 cm AV Vmax:           289.00 cm/s AV Vmean:          180.000 cm/s AV VTI:            0.442 m AV Peak Grad:      33.4 mmHg AV Mean Grad:      15.0 mmHg LVOT Vmax:         62.00 cm/s LVOT Vmean:        34.700 cm/s LVOT VTI:          0.093 m LVOT/AV VTI ratio: 0.21  AORTA Ao Root diam: 3.50 cm MR Peak grad: 114.1 mmHg  TRICUSPID VALVE MR Mean grad: 61.0 mmHg   TR Peak grad:   25.6 mmHg MR Vmax:      534.00 cm/s TR Vmax:        253.00 cm/s MR Vmean:     365.0 cm/s                           SHUNTS                           Systemic VTI:  0.09 m                           Systemic Diam: 2.10 cm Candee Furbish MD Electronically signed by Candee Furbish MD Signature Date/Time: 06/27/2020/4:48:49 PM    Final      Zola Button, MD 06/27/2020, 7:59 PM PGY-1, [place] [facility] pager: [phone], text pages  welcome  Resident Addendum I have separately seen and examined the patient.  I have discussed the findings and exam with the resident and agree with the above note.  I helped develop the management plan that is described in the resident's note and I agree with the content.  Changes have been made in BLUE.    Addison Naegeli, MD PGY-3 [facility] residency program

## 2020-06-27 NOTE — Progress Notes (Signed)
  Echocardiogram 2D Echocardiogram has been performed.  Merrie Roof F 06/27/2020, 3:57 PM

## 2020-06-27 NOTE — ED Triage Notes (Signed)
Reports SOB and pain to center of chest since 4am.  Denies nausea and vomiting.  Dialysis MWF- hasn't missed any appts.

## 2020-06-27 NOTE — Consult Note (Signed)
Cardiology Consultation:   Patient ID: Marco Arnold MRN: [id number]; DOB: October 12, 1965  Admit date: 06/27/2020 Date of Consult: 06/27/2020  Primary Care Provider: Cyndi Bender, [place] Cardiologist: No primary care provider on file. new  [place] Electrophysiologist:  None    Patient Profile:   Marco Arnold is a 54 y.o. male with a hx of CAD with LAD and PCI 06/2019 one stent[facility], on dobutamine in cath mild AS.  Last year echo EF 35-40% with global HK and global HK of ant wall, biatrial enlargerment  Mild to mod TR renal failure, on HD  who is being seen today for the evaluation of hypokinesis at the request of Dr Jeannine Kitten.  History of Present Illness:   Marco Arnold with hx of normal ef 2017 but by last year EF down on nuc study and pt had cardiac cath with normal RCA and LCX but 85% LAD with placement of stent DES.  Now admitted with chest pain and SOB.  hIS hd IS mwf.  HE BECAME SOB this AM at 0400. + diaphoresis, ant chest pain no radiation. + tobacco, + HTN, +HLD.   EKG:  The EKG was personally reviewed and demonstrates:  SR with 1st degree AV block with pr 224 ms, LAD and RBBB similar to prior EKGs no acute changes Telemetry:  Telemetry was personally reviewed and demonstrates:  SR  Na 130, K+ 5.0, BUN 81 Cr 9.09  Hs troponin 257 and 279  CRP 13.4 Hgb 13.8 2 View CXR  IMPRESSION: 1. Enlarged cardiac silhouette. 2. Calcific atherosclerotic disease of the aorta. 3. No focal airspace consolidation.  Echo with EF 25-30% global hypokinesis RV systolic function is moderately reduced. RV size moderately enlarged est. RV systolic pressure 37.8 Lt and Rt atrial size was mod. Dilated.  Mod MVR severe TR , low flow low gradient AS moderate to severe AS  BP on arrival 130/91 now 99/69  Has had ASA today   Past Medical History:  Diagnosis Date  . Arthritis    spine  . Chronic kidney disease    [place] MWF  . Coronary artery disease   . Dialysis  patient [facility]) 2013  . Diverticulitis   . Hyperlipidemia   . Hypertension    dr  Tobie Lords      @ [facility]  med  . Neuromuscular disorder ([place])    nerve pain - tx w/cymbalta  . Neuropathic pain 09/20/2017   Left mid-thoracic    Past Surgical History:  Procedure Laterality Date  . AV FISTULA PLACEMENT  11/10/2011   Procedure: ARTERIOVENOUS (AV) FISTULA CREATION;  Surgeon: Mal Misty, MD;  Location: [place];  Service: Vascular;  Laterality: Left;  Ultrasound guided  . CARDIAC CATHETERIZATION  09/19/2018   On stent study at [facility]  . COLONOSCOPY    . INSERTION OF DIALYSIS CATHETER  02/09/2012   Procedure: INSERTION OF DIALYSIS CATHETER;  Surgeon: Mal Misty, MD;  Location: [place];  Service: Vascular;  Laterality: N/A;  . MASS EXCISION Right 09/07/2018   Procedure: EXCISION MASS RIGHT THUMB;  Surgeon: Charlotte Crumb, MD;  Location: [place];  Service: Orthopedics;  Laterality: Right;  OR AXILLARY BLOCK  . REVISON OF ARTERIOVENOUS FISTULA Left 04/08/2019   Procedure: REVISION PLICATION OF LEFT ARM ARTERIOVENOUS FISTULA;  Surgeon: Serafina Mitchell, MD;  Location: [place];  Service: Vascular;  Laterality: Left;  . SHUNTOGRAM Left 06/03/2012   Procedure: SHUNTOGRAM;  Surgeon: Conrad Childress, MD;  Location:  [place] CATH LAB;  Service: Cardiovascular;  Laterality: Left;  . SHUNTOGRAM Left 01/02/2013   Procedure: SHUNTOGRAM;  Surgeon: Conrad , MD;  Location: [facility] C[facility];  Service: Cardiovascular;  Laterality: Left;     Home Medications:  Prior to Admission medications   Medication Sig Start Date End Date Taking? Authorizing Provider  acetaminophen (TYLENOL 8 HOUR) 650 MG CR tablet Take 1 tablet (650 mg total) by mouth every 8 (eight) hours as needed for pain. 03/23/20  Yes Petrucelli, Glynda Jaeger, PA-C  aspirin EC 81 MG tablet Take 81 mg by mouth daily.   Yes [provider]  cloNIDine (CATAPRES) 0.1 MG tablet Take 0.2 mg by mouth daily. 07/13/16  Yes [provider]  cyclobenzaprine  (FLEXERIL) 10 MG tablet Take 20 mg by mouth 2 (two) times daily as needed for muscle spasms.    Yes [provider]  FOSRENOL 1000 MG chewable tablet Chew 6,000 mg by mouth 3 (three) times daily with meals.  08/31/17  Yes [provider]  hydrOXYzine (ATARAX/VISTARIL) 25 MG tablet Take 25 mg by mouth 2 (two) times daily as needed for anxiety or itching (Sleep).    Yes [provider]  lidocaine (LIDODERM) 5 % Place 1 patch onto the skin daily as needed. Apply patch to area most significant pain once per day.  Remove and discard patch within 12 hours of application. Patient taking differently: Place 1 patch onto the skin daily as needed (Apply patch to area most significant pain once per day.  Remove and discard patch within 12 hours of application.).  03/23/20  Yes Petrucelli, Samantha R, PA-C  multivitamin (RENA-VIT) TABS tablet Take 1 tablet by mouth See admin instructions. Every Monday, Wednesday and Friday   Yes [provider]  naproxen sodium (ALEVE) 220 MG tablet Take 220 mg by mouth 2 (two) times daily as needed (pain).    Yes [provider]  pantoprazole (PROTONIX) 40 MG tablet Take 40 mg by mouth daily.  12/04/12  Yes [provider]  VENTOLIN HFA 108 (90 Base) MCG/ACT inhaler Inhale 2 puffs into the lungs every 6 (six) hours as needed for wheezing or shortness of breath.  12/21/17  Yes [provider]  amLODipine (NORVASC) 10 MG tablet Take 10 mg by mouth at bedtime.  Patient not taking: Reported on 06/15/2020    [provider]  atorvastatin (LIPITOR) 80 MG tablet Take 80 mg by mouth daily.  Patient not taking: Reported on 06/15/2020 01/16/19   [provider]  carvedilol (COREG) 3.125 MG tablet Take 3.125 mg by mouth 2 (two) times daily with a meal.  Patient not taking: Reported on 06/15/2020 01/16/19   [provider]  DULoxetine (CYMBALTA) 60 MG capsule Take 60 mg by mouth every morning.  Patient not  taking: Reported on 06/15/2020 01/29/18   [provider]  gabapentin (NEURONTIN) 100 MG capsule Take 3 capsules (300 mg total) by mouth at bedtime. Patient not taking: Reported on 06/15/2020 09/20/17   Kathrynn Ducking, MD  HYDROcodone-acetaminophen [facility]) 5-325 MG tablet Take 1 tablet by mouth every 6 (six) hours as needed for moderate pain. Patient not taking: Reported on 06/15/2020 04/08/19   Dagoberto Ligas, PA-C  nortriptyline (PAMELOR) 25 MG capsule Take 25 mg by mouth every morning.  Patient not taking: Reported on 06/15/2020 01/29/18   [provider]    Inpatient Medications: Scheduled Meds: . chlorhexidine   Topical Daily  . terbinafine   Topical Daily  Continuous Infusions:  PRN Meds: perflutren lipid microspheres (DEFINITY) IV suspension  Allergies:    Allergies  Allergen Reactions  . Lisinopril Shortness Of Breath    Unable to breath    Social History:   Social History   Socioeconomic History  . Marital status: Divorced    Spouse name: Not on file  . Number of children: 2  . Years of education: 47  . Highest education level: Not on file  Occupational History  . Occupation: disabled  Tobacco Use  . Smoking status: Light Tobacco Smoker    Packs/day: 0.25    Years: 43.00    Pack years: 10.75    Types: Cigarettes  . Smokeless tobacco: Never Used  . Tobacco comment: pt states that he is trying to quit--is trying to cut back at the current time  Vaping Use  . Vaping Use: Former  . Quit date: 08/28/2018  Substance and Sexual Activity  . Alcohol use: Yes    Comment: occasional  . Drug use: Yes    Types: Marijuana  . Sexual activity: Not on file  Other Topics Concern  . Not on file  Social History Narrative   Lives with father   Caffeine use: none   Right handed    Social Determinants of Health   Financial Resource Strain:   . Difficulty of Paying Living Expenses: Not on file  Food Insecurity:   . Worried About Charity fundraiser  in the Last Year: Not on file  . Ran Out of Food in the Last Year: Not on file  Transportation Needs:   . Lack of Transportation (Medical): Not on file  . Lack of Transportation (Non-Medical): Not on file  Physical Activity:   . Days of Exercise per Week: Not on file  . Minutes of Exercise per Session: Not on file  Stress:   . Feeling of Stress : Not on file  Social Connections:   . Frequency of Communication with Friends and Family: Not on file  . Frequency of Social Gatherings with Friends and Family: Not on file  . Attends Religious Services: Not on file  . Active Member of Clubs or Organizations: Not on file  . Attends Archivist Meetings: Not on file  . Marital Status: Not on file  Intimate Partner Violence:   . Fear of Current or Ex-Partner: Not on file  . Emotionally Abused: Not on file  . Physically Abused: Not on file  . Sexually Abused: Not on file    Family History:    Family History  Problem Relation Age of Onset  . Pancreatic cancer Mother   . Diabetes Father   . Hypertension Father   . Cancer - Lung Paternal Grandfather      ROS:  Please see the history of present illness.  General:no colds or fevers, no weight changes Skin:no rashes or ulcers HEENT:no blurred vision, no congestion CV:see HPI PUL:see HPI GI:no diarrhea constipation or melena, no indigestion GU:no hematuria, no dysuria, renal failure MS:no joint pain, no claudication Neuro:no syncope, no lightheadedness Endo:no diabetes, no thyroid disease  All other ROS reviewed and negative.     Physical Exam/Data:   Vitals:   06/27/20 1545 06/27/20 1600 06/27/20 1615 06/27/20 1630  BP: 121/90 130/84 (!) 124/92 128/87  Pulse: 84 85 87 85  Resp: 17 13 19 18   Temp:      TempSrc:      SpO2: 100% 98% 100% 99%  Weight:  Height:       No intake or output data in the 24 hours ending 06/27/20 1724 Last 3 Weights 06/27/2020 06/15/2020 05/29/2019  Weight (lbs) 230 lb 227 lb 235 lb    Weight (kg) 104.327 kg 102.967 kg 106.595 kg     Body mass index is 28.37 kg/m.  EXAM by Dr.Turner   Relevant CV Studies: Echo 06/27/20 IMPRESSIONS    1. Left ventricular ejection fraction, by estimation, is 25 to 30%. The  left ventricle has severely decreased function. The left ventricle  demonstrates global hypokinesis. The left ventricular internal cavity size  was mildly dilated. Left ventricular  diastolic parameters are indeterminate.  2. Right ventricular systolic function is moderately reduced. The right  ventricular size is moderately enlarged. There is mildly elevated  pulmonary artery systolic pressure. The estimated right ventricular  systolic pressure is 75.1 mmHg.  3. Left atrial size was moderately dilated.  4. Right atrial size was moderately dilated.  5. The mitral valve is degenerative. Moderate mitral valve regurgitation.  No evidence of mitral stenosis.  6. Tricuspid valve regurgitation is severe.  7. Low flow, low gradient aortic stenosis. DI is 0.20. The aortic valve  is tricuspid. There is severe calcifcation of the aortic valve. There is  severe thickening of the aortic valve. Aortic valve regurgitation is not  visualized. Moderate to severe  aortic valve stenosis. Aortic valve area, by VTI measures 0.73 cm. Aortic  valve mean gradient measures 15.0 mmHg. Aortic valve Vmax measures 2.89  m/s.  8. The inferior vena cava is dilated in size with >50% respiratory  variability, suggesting right atrial pressure of 8 mmHg.   FINDINGS  Left Ventricle: Left ventricular ejection fraction, by estimation, is 25  to 30%. The left ventricle has severely decreased function. The left  ventricle demonstrates global hypokinesis. The left ventricular internal  cavity size was mildly dilated. There  is no left ventricular hypertrophy. Left ventricular diastolic parameters  are indeterminate.     LV Wall Scoring:  The anterior septum, inferior wall, mid  inferolateral segment, mid  inferoseptal segment, and basal inferoseptal segment are akinetic.   Right Ventricle: The right ventricular size is moderately enlarged. No  increase in right ventricular wall thickness. Right ventricular systolic  function is moderately reduced. There is mildly elevated pulmonary artery  systolic pressure. The tricuspid  regurgitant velocity is 2.53 m/s, and with an assumed right atrial  pressure of 8 mmHg, the estimated right ventricular systolic pressure is  02.5 mmHg.   Left Atrium: Left atrial size was moderately dilated.   Right Atrium: Right atrial size was moderately dilated.   Pericardium: There is no evidence of pericardial effusion.   Mitral Valve: The mitral valve is degenerative in appearance. Moderate  mitral valve regurgitation. No evidence of mitral valve stenosis.   Tricuspid Valve: The tricuspid valve is normal in structure. Tricuspid  valve regurgitation is severe. No evidence of tricuspid stenosis.   Aortic Valve: Low flow, low gradient aortic stenosis. DI is 0.20. The  aortic valve is tricuspid. There is severe calcifcation of the aortic  valve. There is severe thickening of the aortic valve. Aortic valve  regurgitation is not visualized. Moderate to  severe aortic stenosis is present. Aortic valve mean gradient measures  15.0 mmHg. Aortic valve peak gradient measures 33.4 mmHg. Aortic valve  area, by VTI measures 0.73 cm.   Pulmonic Valve: The pulmonic valve was normal in structure. Pulmonic valve  regurgitation is not visualized. No evidence of pulmonic  stenosis.   Aorta: The aortic root is normal in size and structure.   Venous: The inferior vena cava is dilated in size with greater than 50%  respiratory variability, suggesting right atrial pressure of 8 mmHg.   IAS/Shunts: No atrial level shunt detected by color flow Doppler.   Laboratory Data:  High Sensitivity Troponin:   Recent Labs  Lab 06/27/20 1234  06/27/20 1424  TROPONINIHS 257* 279*     Chemistry Recent Labs  Lab 06/27/20 1234  NA 130*  K 5.0  CL 84*  CO2 21*  GLUCOSE 101*  BUN 81*  CREATININE 9.09*  CALCIUM 9.1  GFRNONAA 6*  ANIONGAP 25*    No results for input(s): PROT, ALBUMIN, AST, ALT, ALKPHOS, BILITOT in the last 168 hours. Hematology Recent Labs  Lab 06/27/20 1234  WBC 9.3  RBC 5.01  HGB 13.8  HCT 43.3  MCV 86.4  MCH 27.5  MCHC 31.9  RDW 16.1*  PLT 202   BNPNo results for input(s): BNP, PROBNP in the last 168 hours.  DDimer No results for input(s): DDIMER in the last 168 hours.   Radiology/Studies:  DG Chest 2 View  Result Date: 06/27/2020 CLINICAL DATA:  Chest pain and shortness of breath. EXAM: CHEST - 2 VIEW COMPARISON:  March 23, 2020 FINDINGS: Enlarged cardiac silhouette. Mediastinal contours appear intact. Calcific atherosclerotic disease of the aorta. There is no evidence of focal airspace consolidation, pleural effusion or pneumothorax. Osseous structures are without acute abnormality. Soft tissues are grossly normal. IMPRESSION: 1. Enlarged cardiac silhouette. 2. Calcific atherosclerotic disease of the aorta. 3. No focal airspace consolidation. Electronically Signed   By: Fidela Salisbury M.D.   On: 06/27/2020 12:45   ECHOCARDIOGRAM COMPLETE  Result Date: 06/27/2020    ECHOCARDIOGRAM REPORT   Patient Name:   Nikai A Verno Date of Exam: 06/27/2020 Medical Rec #:  [id number]     Height:       75.5 in Accession #:    [id number]    Weight:       230.0 lb Date of Birth:  June 16, 1966     BSA:          2.340 m Patient Age:    72 years      BP:           153/97 mmHg Patient Gender: M             HR:           84 bpm. Exam Location:  Inpatient Procedure: 2D Echo, Cardiac Doppler and Color Doppler Indications:    R94.31 Abnormal EKG  History:        Patient has no prior history of Echocardiogram examinations.                 CAD; Risk Factors:Hypertension.  Sonographer:    Merrie Roof RDCS Referring Phys:  [id number] [place]  1. Left ventricular ejection fraction, by estimation, is 25 to 30%. The left ventricle has severely decreased function. The left ventricle demonstrates global hypokinesis. The left ventricular internal cavity size was mildly dilated. Left ventricular diastolic parameters are indeterminate.  2. Right ventricular systolic function is moderately reduced. The right ventricular size is moderately enlarged. There is mildly elevated pulmonary artery systolic pressure. The estimated right ventricular systolic pressure is 75.1 mmHg.  3. Left atrial size was moderately dilated.  4. Right atrial size was moderately dilated.  5. The mitral valve is degenerative. Moderate mitral valve regurgitation. No evidence of mitral  stenosis.  6. Tricuspid valve regurgitation is severe.  7. Low flow, low gradient aortic stenosis. DI is 0.20. The aortic valve is tricuspid. There is severe calcifcation of the aortic valve. There is severe thickening of the aortic valve. Aortic valve regurgitation is not visualized. Moderate to severe aortic valve stenosis. Aortic valve area, by VTI measures 0.73 cm. Aortic valve mean gradient measures 15.0 mmHg. Aortic valve Vmax measures 2.89 m/s.  8. The inferior vena cava is dilated in size with >50% respiratory variability, suggesting right atrial pressure of 8 mmHg. FINDINGS  Left Ventricle: Left ventricular ejection fraction, by estimation, is 25 to 30%. The left ventricle has severely decreased function. The left ventricle demonstrates global hypokinesis. The left ventricular internal cavity size was mildly dilated. There is no left ventricular hypertrophy. Left ventricular diastolic parameters are indeterminate.  LV Wall Scoring: The anterior septum, inferior wall, mid inferolateral segment, mid inferoseptal segment, and basal inferoseptal segment are akinetic. Right Ventricle: The right ventricular size is moderately enlarged. No increase in right ventricular  wall thickness. Right ventricular systolic function is moderately reduced. There is mildly elevated pulmonary artery systolic pressure. The tricuspid regurgitant velocity is 2.53 m/s, and with an assumed right atrial pressure of 8 mmHg, the estimated right ventricular systolic pressure is 97.3 mmHg. Left Atrium: Left atrial size was moderately dilated. Right Atrium: Right atrial size was moderately dilated. Pericardium: There is no evidence of pericardial effusion. Mitral Valve: The mitral valve is degenerative in appearance. Moderate mitral valve regurgitation. No evidence of mitral valve stenosis. Tricuspid Valve: The tricuspid valve is normal in structure. Tricuspid valve regurgitation is severe. No evidence of tricuspid stenosis. Aortic Valve: Low flow, low gradient aortic stenosis. DI is 0.20. The aortic valve is tricuspid. There is severe calcifcation of the aortic valve. There is severe thickening of the aortic valve. Aortic valve regurgitation is not visualized. Moderate to severe aortic stenosis is present. Aortic valve mean gradient measures 15.0 mmHg. Aortic valve peak gradient measures 33.4 mmHg. Aortic valve area, by VTI measures 0.73 cm. Pulmonic Valve: The pulmonic valve was normal in structure. Pulmonic valve regurgitation is not visualized. No evidence of pulmonic stenosis. Aorta: The aortic root is normal in size and structure. Venous: The inferior vena cava is dilated in size with greater than 50% respiratory variability, suggesting right atrial pressure of 8 mmHg. IAS/Shunts: No atrial level shunt detected by color flow Doppler.  LEFT VENTRICLE PLAX 2D LVIDd:         6.80 cm LVIDs:         6.10 cm LV PW:         1.20 cm LV IVS:        1.10 cm LVOT diam:     2.10 cm LV SV:         32 LV SV Index:   14 LVOT Area:     3.46 cm  RIGHT VENTRICLE RV Basal diam:  5.80 cm RV Mid diam:    5.50 cm RV S prime:     8.41 cm/s TAPSE (M-mode): 2.3 cm LEFT ATRIUM              Index       RIGHT ATRIUM            Index LA diam:        5.10 cm  2.18 cm/m  RA Area:     28.20 cm LA Vol (A2C):   108.0 ml 46.15 ml/m RA Volume:   108.00 ml 46.15  ml/m LA Vol (A4C):   114.0 ml 48.71 ml/m LA Biplane Vol: 115.0 ml 49.14 ml/m  AORTIC VALVE AV Area (Vmax):    0.74 cm AV Area (Vmean):   0.67 cm AV Area (VTI):     0.73 cm AV Vmax:           289.00 cm/s AV Vmean:          180.000 cm/s AV VTI:            0.442 m AV Peak Grad:      33.4 mmHg AV Mean Grad:      15.0 mmHg LVOT Vmax:         62.00 cm/s LVOT Vmean:        34.700 cm/s LVOT VTI:          0.093 m LVOT/AV VTI ratio: 0.21  AORTA Ao Root diam: 3.50 cm MR Peak grad: 114.1 mmHg  TRICUSPID VALVE MR Mean grad: 61.0 mmHg   TR Peak grad:   25.6 mmHg MR Vmax:      534.00 cm/s TR Vmax:        253.00 cm/s MR Vmean:     365.0 cm/s                           SHUNTS                           Systemic VTI:  0.09 m                           Systemic Diam: 2.10 cm Candee Furbish MD Electronically signed by Candee Furbish MD Signature Date/Time: 06/27/2020/4:48:49 PM    Final      Assessment and Plan:   1. Chest pain with elevated troponin hx of stent to LAD, on ASA, BB, amlodpine clonidine may have NSTEMI though does have renal failure plan cath tomorrow 2. New significant CM may be ischemic with MR and AS will do RHC as well 3. CAD with prior stent to LAD 06/2019 4. HLD on statin lipitor 80 continue 5. HTN meds as above will hold amlodipine for now 6. AS and MR plan for further eval 7. ESRD on HD MWF 8. Tobacco use understands he needs to stop       TIMI Risk Score for Unstable Angina or Non-ST Elevation MI:   The patient's TIMI risk score is 5, which indicates a 26% risk of all cause mortality, new or recurrent myocardial infarction or need for urgent revascularization in the next 14 days.        For questions or updates, please contact [place] Please consult [email or web address] for contact info under    Signed, Cecilie Kicks, NP  06/27/2020 5:24 PM

## 2020-06-27 NOTE — ED Notes (Signed)
Attending just left pt Room

## 2020-06-27 NOTE — Progress Notes (Signed)
ANTICOAGULATION CONSULT NOTE - Initial Consult  Pharmacy Consult for Heparin Indication: chest pain/ACS  Allergies  Allergen Reactions  . Lisinopril Shortness Of Breath    Unable to breath    Patient Measurements: Height: 6' 3.5" (191.8 cm) Weight: 104.3 kg (230 lb) IBW/kg (Calculated) : 85.65 Heparin Dosing Weight: 104kg  Vital Signs: Temp: 97.7 F (36.5 C) (10/31 2036) Temp Source: Oral (10/31 2036) BP: 126/93 (10/31 2036) Pulse Rate: 81 (10/31 2036)  Labs: Recent Labs    06/27/20 1234 06/27/20 1424 06/27/20 1937  HGB 13.8  --   --   HCT 43.3  --   --   PLT 202  --   --   CREATININE 9.09*  --   --   TROPONINIHS 257* 279* 263*    Estimated Creatinine Clearance: 12.2 mL/min (A) (by C-G formula based on SCr of 9.09 mg/dL (H)).   Medical History: Past Medical History:  Diagnosis Date  . Arthritis    spine  . Chronic kidney disease    [place] MWF  . Coronary artery disease   . Dialysis patient [facility]) 2013  . Diverticulitis   . Hyperlipidemia   . Hypertension    dr  Tobie Lords      @ [facility]  med  . Neuromuscular disorder ([place])    nerve pain - tx w/cymbalta  . Neuropathic pain 09/20/2017   Left mid-thoracic   Assessment: 54 yo M with hx LAD stent 06/2019 with chest pain. No AC PTA. Pharmacy consulted for heparin. Planned LHC 11/1.  H/H, plt stable. Troponins slightly elevated, flat.   Goal of Therapy:  Heparin level 0.3-0.7 units/ml Monitor platelets by anticoagulation protocol: Yes   Plan:  Heparin 4000 unit bolus then 1400 units/hr Monitor daily HL, CBC/plt Monitor for signs/symptoms of bleeding  F/u Cath   Benetta Spar, PharmD, BCPS, [facility] Clinical Pharmacist  Please check AMION for all [place] phone numbers After 10:00 PM, call [place]

## 2020-06-28 ENCOUNTER — Encounter (HOSPITAL_COMMUNITY): Payer: Self-pay | Admitting: Cardiovascular Disease

## 2020-06-28 ENCOUNTER — Encounter (HOSPITAL_COMMUNITY)
Admission: EM | Disposition: A | Discharge status: Home / Self Care | Payer: Self-pay | Source: Home / Self Care | Attending: Family Medicine

## 2020-06-28 DIAGNOSIS — N2581 Secondary hyperparathyroidism of renal origin: Secondary | ICD-10-CM | POA: Diagnosis not present

## 2020-06-28 DIAGNOSIS — I313 Pericardial effusion (noninflammatory): Secondary | ICD-10-CM | POA: Diagnosis not present

## 2020-06-28 DIAGNOSIS — N186 End stage renal disease: Secondary | ICD-10-CM | POA: Diagnosis not present

## 2020-06-28 DIAGNOSIS — I350 Nonrheumatic aortic (valve) stenosis: Secondary | ICD-10-CM

## 2020-06-28 DIAGNOSIS — I5043 Acute on chronic combined systolic (congestive) and diastolic (congestive) heart failure: Secondary | ICD-10-CM | POA: Diagnosis not present

## 2020-06-28 DIAGNOSIS — B9562 Methicillin resistant Staphylococcus aureus infection as the cause of diseases classified elsewhere: Secondary | ICD-10-CM | POA: Diagnosis present

## 2020-06-28 DIAGNOSIS — M545 Low back pain, unspecified: Secondary | ICD-10-CM | POA: Diagnosis present

## 2020-06-28 DIAGNOSIS — R234 Changes in skin texture: Secondary | ICD-10-CM | POA: Diagnosis not present

## 2020-06-28 DIAGNOSIS — L989 Disorder of the skin and subcutaneous tissue, unspecified: Secondary | ICD-10-CM | POA: Diagnosis not present

## 2020-06-28 DIAGNOSIS — D696 Thrombocytopenia, unspecified: Secondary | ICD-10-CM | POA: Diagnosis present

## 2020-06-28 DIAGNOSIS — E871 Hypo-osmolality and hyponatremia: Secondary | ICD-10-CM | POA: Diagnosis not present

## 2020-06-28 DIAGNOSIS — I442 Atrioventricular block, complete: Secondary | ICD-10-CM | POA: Diagnosis not present

## 2020-06-28 DIAGNOSIS — R0602 Shortness of breath: Secondary | ICD-10-CM | POA: Diagnosis not present

## 2020-06-28 DIAGNOSIS — Z20822 Contact with and (suspected) exposure to covid-19: Secondary | ICD-10-CM | POA: Diagnosis not present

## 2020-06-28 DIAGNOSIS — I214 Non-ST elevation (NSTEMI) myocardial infarction: Secondary | ICD-10-CM | POA: Diagnosis not present

## 2020-06-28 DIAGNOSIS — G629 Polyneuropathy, unspecified: Secondary | ICD-10-CM | POA: Diagnosis present

## 2020-06-28 DIAGNOSIS — I359 Nonrheumatic aortic valve disorder, unspecified: Secondary | ICD-10-CM | POA: Diagnosis not present

## 2020-06-28 DIAGNOSIS — Z515 Encounter for palliative care: Secondary | ICD-10-CM | POA: Diagnosis not present

## 2020-06-28 DIAGNOSIS — Z992 Dependence on renal dialysis: Secondary | ICD-10-CM | POA: Diagnosis not present

## 2020-06-28 DIAGNOSIS — M7989 Other specified soft tissue disorders: Secondary | ICD-10-CM | POA: Diagnosis not present

## 2020-06-28 DIAGNOSIS — I200 Unstable angina: Secondary | ICD-10-CM | POA: Diagnosis not present

## 2020-06-28 DIAGNOSIS — M2142 Flat foot [pes planus] (acquired), left foot: Secondary | ICD-10-CM | POA: Diagnosis present

## 2020-06-28 DIAGNOSIS — I083 Combined rheumatic disorders of mitral, aortic and tricuspid valves: Secondary | ICD-10-CM | POA: Diagnosis present

## 2020-06-28 DIAGNOSIS — L309 Dermatitis, unspecified: Secondary | ICD-10-CM | POA: Diagnosis not present

## 2020-06-28 DIAGNOSIS — M19071 Primary osteoarthritis, right ankle and foot: Secondary | ICD-10-CM | POA: Diagnosis not present

## 2020-06-28 DIAGNOSIS — R7303 Prediabetes: Secondary | ICD-10-CM | POA: Diagnosis not present

## 2020-06-28 DIAGNOSIS — L308 Other specified dermatitis: Secondary | ICD-10-CM | POA: Diagnosis not present

## 2020-06-28 DIAGNOSIS — D631 Anemia in chronic kidney disease: Secondary | ICD-10-CM | POA: Diagnosis not present

## 2020-06-28 DIAGNOSIS — M79672 Pain in left foot: Secondary | ICD-10-CM | POA: Diagnosis not present

## 2020-06-28 DIAGNOSIS — L986 Other infiltrative disorders of the skin and subcutaneous tissue: Secondary | ICD-10-CM | POA: Diagnosis not present

## 2020-06-28 DIAGNOSIS — R079 Chest pain, unspecified: Secondary | ICD-10-CM

## 2020-06-28 DIAGNOSIS — I25110 Atherosclerotic heart disease of native coronary artery with unstable angina pectoris: Secondary | ICD-10-CM | POA: Diagnosis not present

## 2020-06-28 DIAGNOSIS — R0789 Other chest pain: Secondary | ICD-10-CM | POA: Diagnosis not present

## 2020-06-28 DIAGNOSIS — M7731 Calcaneal spur, right foot: Secondary | ICD-10-CM | POA: Diagnosis not present

## 2020-06-28 DIAGNOSIS — N250 Renal osteodystrophy: Secondary | ICD-10-CM | POA: Diagnosis not present

## 2020-06-28 DIAGNOSIS — M79671 Pain in right foot: Secondary | ICD-10-CM | POA: Diagnosis not present

## 2020-06-28 DIAGNOSIS — M205X2 Other deformities of toe(s) (acquired), left foot: Secondary | ICD-10-CM | POA: Diagnosis present

## 2020-06-28 DIAGNOSIS — Q211 Atrial septal defect: Secondary | ICD-10-CM | POA: Diagnosis not present

## 2020-06-28 DIAGNOSIS — G8929 Other chronic pain: Secondary | ICD-10-CM | POA: Diagnosis present

## 2020-06-28 DIAGNOSIS — Z955 Presence of coronary angioplasty implant and graft: Secondary | ICD-10-CM | POA: Diagnosis not present

## 2020-06-28 DIAGNOSIS — I340 Nonrheumatic mitral (valve) insufficiency: Secondary | ICD-10-CM | POA: Diagnosis not present

## 2020-06-28 DIAGNOSIS — I132 Hypertensive heart and chronic kidney disease with heart failure and with stage 5 chronic kidney disease, or end stage renal disease: Secondary | ICD-10-CM | POA: Diagnosis not present

## 2020-06-28 DIAGNOSIS — I120 Hypertensive chronic kidney disease with stage 5 chronic kidney disease or end stage renal disease: Secondary | ICD-10-CM | POA: Diagnosis not present

## 2020-06-28 DIAGNOSIS — I509 Heart failure, unspecified: Secondary | ICD-10-CM | POA: Diagnosis not present

## 2020-06-28 DIAGNOSIS — R739 Hyperglycemia, unspecified: Secondary | ICD-10-CM | POA: Diagnosis not present

## 2020-06-28 DIAGNOSIS — Z7189 Other specified counseling: Secondary | ICD-10-CM | POA: Diagnosis not present

## 2020-06-28 DIAGNOSIS — E8770 Fluid overload, unspecified: Secondary | ICD-10-CM | POA: Diagnosis not present

## 2020-06-28 DIAGNOSIS — E875 Hyperkalemia: Secondary | ICD-10-CM | POA: Diagnosis present

## 2020-06-28 DIAGNOSIS — M19072 Primary osteoarthritis, left ankle and foot: Secondary | ICD-10-CM | POA: Diagnosis present

## 2020-06-28 DIAGNOSIS — L97129 Non-pressure chronic ulcer of left thigh with unspecified severity: Secondary | ICD-10-CM | POA: Diagnosis not present

## 2020-06-28 DIAGNOSIS — I700 Atherosclerosis of aorta: Secondary | ICD-10-CM | POA: Diagnosis not present

## 2020-06-28 DIAGNOSIS — M7732 Calcaneal spur, left foot: Secondary | ICD-10-CM | POA: Diagnosis not present

## 2020-06-28 DIAGNOSIS — I361 Nonrheumatic tricuspid (valve) insufficiency: Secondary | ICD-10-CM | POA: Diagnosis not present

## 2020-06-28 DIAGNOSIS — L089 Local infection of the skin and subcutaneous tissue, unspecified: Secondary | ICD-10-CM | POA: Diagnosis present

## 2020-06-28 DIAGNOSIS — I2510 Atherosclerotic heart disease of native coronary artery without angina pectoris: Secondary | ICD-10-CM | POA: Diagnosis not present

## 2020-06-28 HISTORY — PX: RIGHT/LEFT HEART CATH AND CORONARY ANGIOGRAPHY: CATH118266

## 2020-06-28 LAB — POCT I-STAT EG7
Acid-Base Excess: 3 mmol/L — ABNORMAL HIGH (ref 0.0–2.0)
Acid-Base Excess: 3 mmol/L — ABNORMAL HIGH (ref 0.0–2.0)
Bicarbonate: 28.4 mmol/L — ABNORMAL HIGH (ref 20.0–28.0)
Bicarbonate: 28.6 mmol/L — ABNORMAL HIGH (ref 20.0–28.0)
Calcium, Ion: 1.04 mmol/L — ABNORMAL LOW (ref 1.15–1.40)
Calcium, Ion: 1.04 mmol/L — ABNORMAL LOW (ref 1.15–1.40)
HCT: 49 % (ref 39.0–52.0)
HCT: 49 % (ref 39.0–52.0)
Hemoglobin: 16.7 g/dL (ref 13.0–17.0)
Hemoglobin: 16.7 g/dL (ref 13.0–17.0)
O2 Saturation: 68 %
O2 Saturation: 71 %
Potassium: 4 mmol/L (ref 3.5–5.1)
Potassium: 4 mmol/L (ref 3.5–5.1)
Sodium: 136 mmol/L (ref 135–145)
Sodium: 136 mmol/L (ref 135–145)
TCO2: 30 mmol/L (ref 22–32)
TCO2: 30 mmol/L (ref 22–32)
pCO2, Ven: 46.4 mmHg (ref 44.0–60.0)
pCO2, Ven: 46.5 mmHg (ref 44.0–60.0)
pH, Ven: 7.395 (ref 7.250–7.430)
pH, Ven: 7.396 (ref 7.250–7.430)
pO2, Ven: 36 mmHg (ref 32.0–45.0)
pO2, Ven: 38 mmHg (ref 32.0–45.0)

## 2020-06-28 LAB — RENAL FUNCTION PANEL
Albumin: 3.3 g/dL — ABNORMAL LOW (ref 3.5–5.0)
Anion gap: 27 — ABNORMAL HIGH (ref 5–15)
BUN: 94 mg/dL — ABNORMAL HIGH (ref 6–20)
CO2: 16 mmol/L — ABNORMAL LOW (ref 22–32)
Calcium: 9.2 mg/dL (ref 8.9–10.3)
Chloride: 86 mmol/L — ABNORMAL LOW (ref 98–111)
Creatinine, Ser: 10.03 mg/dL — ABNORMAL HIGH (ref 0.61–1.24)
GFR, Estimated: 6 mL/min — ABNORMAL LOW (ref >60)
Glucose, Bld: 104 mg/dL — ABNORMAL HIGH (ref 70–99)
Phosphorus: >30 mg/dL — ABNORMAL HIGH (ref 2.5–4.6)
Potassium: 5.9 mmol/L — ABNORMAL HIGH (ref 3.5–5.1)
Sodium: 129 mmol/L — ABNORMAL LOW (ref 135–145)

## 2020-06-28 LAB — POCT I-STAT 7, (LYTES, BLD GAS, ICA,H+H)
Acid-Base Excess: 2 mmol/L (ref 0.0–2.0)
Bicarbonate: 27 mmol/L (ref 20.0–28.0)
Calcium, Ion: 1.02 mmol/L — ABNORMAL LOW (ref 1.15–1.40)
HCT: 48 % (ref 39.0–52.0)
Hemoglobin: 16.3 g/dL (ref 13.0–17.0)
O2 Saturation: 98 %
Potassium: 3.9 mmol/L (ref 3.5–5.1)
Sodium: 135 mmol/L (ref 135–145)
TCO2: 28 mmol/L (ref 22–32)
pCO2 arterial: 43.3 mmHg (ref 32.0–48.0)
pH, Arterial: 7.402 (ref 7.350–7.450)
pO2, Arterial: 112 mmHg — ABNORMAL HIGH (ref 83.0–108.0)

## 2020-06-28 LAB — CBC
HCT: 45.5 % (ref 39.0–52.0)
Hemoglobin: 14.7 g/dL (ref 13.0–17.0)
MCH: 27.5 pg (ref 26.0–34.0)
MCHC: 32.3 g/dL (ref 30.0–36.0)
MCV: 85.2 fL (ref 80.0–100.0)
Platelets: 236 10*3/uL (ref 150–400)
RBC: 5.34 MIL/uL (ref 4.22–5.81)
RDW: 16.3 % — ABNORMAL HIGH (ref 11.5–15.5)
WBC: 9.7 10*3/uL (ref 4.0–10.5)
nRBC: 0.2 % (ref 0.0–0.2)

## 2020-06-28 LAB — HEPARIN LEVEL (UNFRACTIONATED): Heparin Unfractionated: 0.27 IU/mL — ABNORMAL LOW (ref 0.30–0.70)

## 2020-06-28 LAB — HEPATITIS B CORE ANTIBODY, TOTAL: Hep B Core Total Ab: NONREACTIVE

## 2020-06-28 LAB — HEPATITIS B SURFACE ANTIGEN: Hepatitis B Surface Ag: NONREACTIVE

## 2020-06-28 SURGERY — RIGHT/LEFT HEART CATH AND CORONARY ANGIOGRAPHY
Anesthesia: LOCAL

## 2020-06-28 MED ORDER — ASPIRIN 81 MG PO CHEW
81.0000 mg | CHEWABLE_TABLET | Freq: Every day | ORAL | Status: DC
  Administered 2020-06-29 – 2020-07-03 (×5): 81 mg via ORAL
  Filled 2020-06-28 (×5): qty 1

## 2020-06-28 MED ORDER — LABETALOL HCL 5 MG/ML IV SOLN: 10.0000 mg | INTRAVENOUS | Status: AC | PRN

## 2020-06-28 MED ORDER — CHLORHEXIDINE GLUCONATE CLOTH 2 % EX PADS
6.0000 | MEDICATED_PAD | Freq: Every day | CUTANEOUS | Status: DC
  Administered 2020-06-28 – 2020-07-01 (×2): 6 via TOPICAL

## 2020-06-28 MED ORDER — HEPARIN (PORCINE) 25000 UT/250ML-% IV SOLN
1700.0000 [IU]/h | INTRAVENOUS | Status: DC
  Administered 2020-06-28: 1550 [IU]/h via INTRAVENOUS
  Administered 2020-06-30: 1800 [IU]/h via INTRAVENOUS
  Filled 2020-06-28 (×4): qty 250

## 2020-06-28 MED ORDER — ONDANSETRON HCL 4 MG/2ML IJ SOLN: 4.0000 mg | Freq: Four times a day (QID) | INTRAMUSCULAR | Status: DC | PRN

## 2020-06-28 MED ORDER — HEPARIN (PORCINE) IN NACL 1000-0.9 UT/500ML-% IV SOLN
INTRAVENOUS | Status: DC | PRN
  Administered 2020-06-28 (×3): 500 mL

## 2020-06-28 MED ORDER — MIDAZOLAM HCL 2 MG/2ML IJ SOLN
INTRAMUSCULAR | Status: DC | PRN
  Administered 2020-06-28: 1 mg via INTRAVENOUS

## 2020-06-28 MED ORDER — SODIUM CHLORIDE 0.9% FLUSH
3.0000 mL | Freq: Two times a day (BID) | INTRAVENOUS | Status: DC
  Administered 2020-06-29 – 2020-07-03 (×6): 3 mL via INTRAVENOUS

## 2020-06-28 MED ORDER — FENTANYL CITRATE (PF) 100 MCG/2ML IJ SOLN
INTRAMUSCULAR | Status: AC
  Filled 2020-06-28: qty 2

## 2020-06-28 MED ORDER — SODIUM CHLORIDE 0.9 % IV SOLN: INTRAVENOUS | Status: AC

## 2020-06-28 MED ORDER — ACETAMINOPHEN 325 MG PO TABS: 650.0000 mg | ORAL_TABLET | ORAL | Status: DC | PRN

## 2020-06-28 MED ORDER — SODIUM CHLORIDE 0.9 % IV SOLN: 250.0000 mL | INTRAVENOUS | Status: DC | PRN

## 2020-06-28 MED ORDER — LIDOCAINE HCL (PF) 1 % IJ SOLN
INTRAMUSCULAR | Status: AC
  Filled 2020-06-28: qty 30

## 2020-06-28 MED ORDER — HYDROCODONE-ACETAMINOPHEN 5-325 MG PO TABS
1.0000 | ORAL_TABLET | ORAL | Status: DC | PRN
  Administered 2020-06-28 – 2020-06-29 (×4): 2 via ORAL
  Administered 2020-06-30: 1 via ORAL
  Administered 2020-06-30: 2 via ORAL
  Filled 2020-06-28: qty 2
  Filled 2020-06-28: qty 1
  Filled 2020-06-28 (×4): qty 2

## 2020-06-28 MED ORDER — MIDAZOLAM HCL 2 MG/2ML IJ SOLN
INTRAMUSCULAR | Status: AC
  Filled 2020-06-28: qty 2

## 2020-06-28 MED ORDER — ATORVASTATIN CALCIUM 80 MG PO TABS
80.0000 mg | ORAL_TABLET | Freq: Every day | ORAL | Status: DC
  Administered 2020-06-28 – 2020-07-03 (×6): 80 mg via ORAL
  Filled 2020-06-28 (×5): qty 1

## 2020-06-28 MED ORDER — SODIUM CHLORIDE 0.9% FLUSH: 3.0000 mL | INTRAVENOUS | Status: DC | PRN

## 2020-06-28 MED ORDER — HEPARIN (PORCINE) IN NACL 1000-0.9 UT/500ML-% IV SOLN
INTRAVENOUS | Status: AC
  Filled 2020-06-28: qty 1000

## 2020-06-28 MED ORDER — FENTANYL CITRATE (PF) 100 MCG/2ML IJ SOLN
INTRAMUSCULAR | Status: DC | PRN
  Administered 2020-06-28: 25 ug via INTRAVENOUS

## 2020-06-28 MED ORDER — MORPHINE SULFATE (PF) 2 MG/ML IV SOLN: 2.0000 mg | INTRAVENOUS | Status: DC | PRN

## 2020-06-28 MED ORDER — LIDOCAINE HCL (PF) 1 % IJ SOLN
INTRAMUSCULAR | Status: DC | PRN
  Administered 2020-06-28: 15 mL

## 2020-06-28 MED ORDER — HYDRALAZINE HCL 20 MG/ML IJ SOLN: 10.0000 mg | INTRAMUSCULAR | Status: AC | PRN

## 2020-06-28 MED ORDER — IOHEXOL 350 MG/ML SOLN
INTRAVENOUS | Status: DC | PRN
  Administered 2020-06-28: 90 mL

## 2020-06-28 SURGICAL SUPPLY — 15 items
CATH INFINITI 5FR MULTPACK ANG (CATHETERS) ×2 IMPLANT
CATH LAUNCHER 5F EBU3.5 (CATHETERS) ×2 IMPLANT
CATH SWAN GANZ 7F STRAIGHT (CATHETERS) ×2 IMPLANT
CLOSURE MYNX CONTROL 5F (Vascular Products) ×2 IMPLANT
CLOSURE MYNX CONTROL 6F/7F (Vascular Products) ×2 IMPLANT
KIT HEART LEFT (KITS) ×2 IMPLANT
PACK CARDIAC CATHETERIZATION (CUSTOM PROCEDURE TRAY) ×2 IMPLANT
SHEATH PINNACLE 5F 10CM (SHEATH) ×2 IMPLANT
SHEATH PINNACLE 7F 10CM (SHEATH) ×2 IMPLANT
SHEATH PROBE COVER 6X72 (BAG) ×2 IMPLANT
TRANSDUCER W/STOPCOCK (MISCELLANEOUS) ×2 IMPLANT
TUBING CIL FLEX 10 FLL-RA (TUBING) ×2 IMPLANT
WIRE EMERALD 3MM-J .025X260CM (WIRE) ×2 IMPLANT
WIRE EMERALD 3MM-J .035X150CM (WIRE) ×2 IMPLANT
WIRE EMERALD ST .035X150CM (WIRE) ×2 IMPLANT

## 2020-06-28 NOTE — Progress Notes (Signed)
Pt informed bedrest 3 hrs, dinner order placed. VSS

## 2020-06-28 NOTE — H&P (View-Only) (Signed)
Progress Note  Patient Name: Marco Arnold Date of Encounter: 06/28/2020  [place] Cardiologist: No primary care provider on file.   Subjective   He is frustrated this morning, wants to go home.  Denies any further chest pain.  Inpatient Medications    Scheduled Meds: . aspirin EC  81 mg Oral Daily  . atorvastatin  80 mg Oral Daily  . cloNIDine  0.2 mg Oral Daily  . lanthanum  4,000 mg Oral TID WC  . multivitamin  1 tablet Oral Q M,W,F  . nicotine  21 mg Transdermal Daily  . pantoprazole  40 mg Oral Daily  . sodium chloride flush  3 mL Intravenous Q12H   Continuous Infusions: . sodium chloride    . sodium chloride 10 mL/hr at 06/28/20 0559  . heparin 1,550 Units/hr (06/28/20 0750)   PRN Meds: sodium chloride, acetaminophen **OR** acetaminophen, albuterol, cyclobenzaprine, diclofenac Sodium, hydrOXYzine, lidocaine, polyethylene glycol, sodium chloride flush   Vital Signs    Vitals:   06/27/20 2036 06/28/20 0028 06/28/20 0345 06/28/20 0815  BP: (!) 126/93  (!) 119/91 (!) 138/107  Pulse: 81  87 82  Resp: 18  18 19   Temp: 97.7 F (36.5 C)  97.6 F (36.4 C) 97.6 F (36.4 C)  TempSrc: Oral  Oral Oral  SpO2: 100%  99% 99%  Weight:  103 kg    Height:        Intake/Output Summary (Last 24 hours) at 06/28/2020 0908 Last data filed at 06/27/2020 2300 Gross per 24 hour  Intake 360 ml  Output --  Net 360 ml   Last 3 Weights 06/28/2020 06/27/2020 06/15/2020  Weight (lbs) 227 lb 1.6 oz 230 lb 227 lb  Weight (kg) 103.012 kg 104.327 kg 102.967 kg      Telemetry    SR in 80s - Personally Reviewed  ECG    No new ECG- Personally Reviewed  Physical Exam   GEN: No acute distress.   Neck: No JVD Cardiac: RRR, 3/6 systolic murmur loudest at RUSB Respiratory: Clear to auscultation bilaterally. GI: Soft, nontender, non-distended  MS: 1+ edema Neuro:  Nonfocal  Psych: Normal affect   Labs    High Sensitivity Troponin:   Recent Labs  Lab 06/27/20 1234  06/27/20 1424 06/27/20 1937 06/27/20 2123  TROPONINIHS 257* 279* 263* 302*      Chemistry Recent Labs  Lab 06/27/20 1234 06/28/20 0437  NA 130* 129*  K 5.0 5.9*  CL 84* 86*  CO2 21* 16*  GLUCOSE 101* 104*  BUN 81* 94*  CREATININE 9.09* 10.03*  CALCIUM 9.1 9.2  ALBUMIN  --  3.3*  GFRNONAA 6* 6*  ANIONGAP 25* 27*     Hematology Recent Labs  Lab 06/27/20 1234 06/28/20 0437  WBC 9.3 9.7  RBC 5.01 5.34  HGB 13.8 14.7  HCT 43.3 45.5  MCV 86.4 85.2  MCH 27.5 27.5  MCHC 31.9 32.3  RDW 16.1* 16.3*  PLT 202 236    BNPNo results for input(s): BNP, PROBNP in the last 168 hours.   DDimer No results for input(s): DDIMER in the last 168 hours.   Radiology    DG Chest 2 View  Result Date: 06/27/2020 CLINICAL DATA:  Chest pain and shortness of breath. EXAM: CHEST - 2 VIEW COMPARISON:  March 23, 2020 FINDINGS: Enlarged cardiac silhouette. Mediastinal contours appear intact. Calcific atherosclerotic disease of the aorta. There is no evidence of focal airspace consolidation, pleural effusion or pneumothorax. Osseous structures are without acute  abnormality. Soft tissues are grossly normal. IMPRESSION: 1. Enlarged cardiac silhouette. 2. Calcific atherosclerotic disease of the aorta. 3. No focal airspace consolidation. Electronically Signed   By: Fidela Salisbury M.D.   On: 06/27/2020 12:45   ECHOCARDIOGRAM COMPLETE  Result Date: 06/27/2020    ECHOCARDIOGRAM REPORT   Patient Name:   Marco Arnold Date of Exam: 06/27/2020 Medical Rec #:  [id number]     Height:       75.5 in Accession #:    [id number]    Weight:       230.0 lb Date of Birth:  10/06/1965     BSA:          2.340 m Patient Age:    54 years      BP:           153/97 mmHg Patient Gender: M             HR:           84 bpm. Exam Location:  Inpatient Procedure: 2D Echo, Cardiac Doppler and Color Doppler Indications:    R94.31 Abnormal EKG  History:        Patient has no prior history of Echocardiogram examinations.                  CAD; Risk Factors:Hypertension.  Sonographer:    Merrie Roof RDCS Referring Phys: [id number] [place]  1. Left ventricular ejection fraction, by estimation, is 25 to 30%. The left ventricle has severely decreased function. The left ventricle demonstrates global hypokinesis. The left ventricular internal cavity size was mildly dilated. Left ventricular diastolic parameters are indeterminate.  2. Right ventricular systolic function is moderately reduced. The right ventricular size is moderately enlarged. There is mildly elevated pulmonary artery systolic pressure. The estimated right ventricular systolic pressure is 75.6 mmHg.  3. Left atrial size was moderately dilated.  4. Right atrial size was moderately dilated.  5. The mitral valve is degenerative. Moderate mitral valve regurgitation. No evidence of mitral stenosis.  6. Tricuspid valve regurgitation is severe.  7. Low flow, low gradient aortic stenosis. DI is 0.20. The aortic valve is tricuspid. There is severe calcifcation of the aortic valve. There is severe thickening of the aortic valve. Aortic valve regurgitation is not visualized. Moderate to severe aortic valve stenosis. Aortic valve area, by VTI measures 0.73 cm. Aortic valve mean gradient measures 15.0 mmHg. Aortic valve Vmax measures 2.89 m/s.  8. The inferior vena cava is dilated in size with >50% respiratory variability, suggesting right atrial pressure of 8 mmHg. FINDINGS  Left Ventricle: Left ventricular ejection fraction, by estimation, is 25 to 30%. The left ventricle has severely decreased function. The left ventricle demonstrates global hypokinesis. The left ventricular internal cavity size was mildly dilated. There is no left ventricular hypertrophy. Left ventricular diastolic parameters are indeterminate.  LV Wall Scoring: The anterior septum, inferior wall, mid inferolateral segment, mid inferoseptal segment, and basal inferoseptal segment are akinetic. Right Ventricle:  The right ventricular size is moderately enlarged. No increase in right ventricular wall thickness. Right ventricular systolic function is moderately reduced. There is mildly elevated pulmonary artery systolic pressure. The tricuspid regurgitant velocity is 2.53 m/s, and with an assumed right atrial pressure of 8 mmHg, the estimated right ventricular systolic pressure is 43.3 mmHg. Left Atrium: Left atrial size was moderately dilated. Right Atrium: Right atrial size was moderately dilated. Pericardium: There is no evidence of pericardial effusion. Mitral Valve: The mitral valve is degenerative in  appearance. Moderate mitral valve regurgitation. No evidence of mitral valve stenosis. Tricuspid Valve: The tricuspid valve is normal in structure. Tricuspid valve regurgitation is severe. No evidence of tricuspid stenosis. Aortic Valve: Low flow, low gradient aortic stenosis. DI is 0.20. The aortic valve is tricuspid. There is severe calcifcation of the aortic valve. There is severe thickening of the aortic valve. Aortic valve regurgitation is not visualized. Moderate to severe aortic stenosis is present. Aortic valve mean gradient measures 15.0 mmHg. Aortic valve peak gradient measures 33.4 mmHg. Aortic valve area, by VTI measures 0.73 cm. Pulmonic Valve: The pulmonic valve was normal in structure. Pulmonic valve regurgitation is not visualized. No evidence of pulmonic stenosis. Aorta: The aortic root is normal in size and structure. Venous: The inferior vena cava is dilated in size with greater than 50% respiratory variability, suggesting right atrial pressure of 8 mmHg. IAS/Shunts: No atrial level shunt detected by color flow Doppler.  LEFT VENTRICLE PLAX 2D LVIDd:         6.80 cm LVIDs:         6.10 cm LV PW:         1.20 cm LV IVS:        1.10 cm LVOT diam:     2.10 cm LV SV:         32 LV SV Index:   14 LVOT Area:     3.46 cm  RIGHT VENTRICLE RV Basal diam:  5.80 cm RV Mid diam:    5.50 cm RV S prime:     8.41 cm/s  TAPSE (M-mode): 2.3 cm LEFT ATRIUM              Index       RIGHT ATRIUM           Index LA diam:        5.10 cm  2.18 cm/m  RA Area:     28.20 cm LA Vol (A2C):   108.0 ml 46.15 ml/m RA Volume:   108.00 ml 46.15 ml/m LA Vol (A4C):   114.0 ml 48.71 ml/m LA Biplane Vol: 115.0 ml 49.14 ml/m  AORTIC VALVE AV Area (Vmax):    0.74 cm AV Area (Vmean):   0.67 cm AV Area (VTI):     0.73 cm AV Vmax:           289.00 cm/s AV Vmean:          180.000 cm/s AV VTI:            0.442 m AV Peak Grad:      33.4 mmHg AV Mean Grad:      15.0 mmHg LVOT Vmax:         62.00 cm/s LVOT Vmean:        34.700 cm/s LVOT VTI:          0.093 m LVOT/AV VTI ratio: 0.21  AORTA Ao Root diam: 3.50 cm MR Peak grad: 114.1 mmHg  TRICUSPID VALVE MR Mean grad: 61.0 mmHg   TR Peak grad:   25.6 mmHg MR Vmax:      534.00 cm/s TR Vmax:        253.00 cm/s MR Vmean:     365.0 cm/s                           SHUNTS  Systemic VTI:  0.09 m                           Systemic Diam: 2.10 cm Candee Furbish MD Electronically signed by Candee Furbish MD Signature Date/Time: 06/27/2020/4:48:49 PM    Final     Cardiac Studies   Echo 06/27/20: 1. Left ventricular ejection fraction, by estimation, is 25 to 30%. The  left ventricle has severely decreased function. The left ventricle  demonstrates global hypokinesis. The left ventricular internal cavity size  was mildly dilated. Left ventricular  diastolic parameters are indeterminate.  2. Right ventricular systolic function is moderately reduced. The right  ventricular size is moderately enlarged. There is mildly elevated  pulmonary artery systolic pressure. The estimated right ventricular  systolic pressure is 62.7 mmHg.  3. Left atrial size was moderately dilated.  4. Right atrial size was moderately dilated.  5. The mitral valve is degenerative. Moderate mitral valve regurgitation.  No evidence of mitral stenosis.  6. Tricuspid valve regurgitation is severe.  7. Low  flow, low gradient aortic stenosis. DI is 0.20. The aortic valve  is tricuspid. There is severe calcifcation of the aortic valve. There is  severe thickening of the aortic valve. Aortic valve regurgitation is not  visualized. Moderate to severe  aortic valve stenosis. Aortic valve area, by VTI measures 0.73 cm. Aortic  valve mean gradient measures 15.0 mmHg. Aortic valve Vmax measures 2.89  m/s.  8. The inferior vena cava is dilated in size with >50% respiratory  variability, suggesting right atrial pressure of 8 mmHg.   Patient Profile     54 y.o. male with a hx of CAD with s/p LAD PCI 06/2019 at [facility], AS, ESRD, tobacco use  Assessment & Plan    NSTEMI: flat troponin elevation (257 > 279 > 263 > 302), may represent demand ischemia in setting of decompensated heart failure though did present with chest pain.  H/o stent to LAD.  Echo shows worsening cardiomyopathy, EF 25 to 30% -Continue ASA, atorva 80 mg -Continue heparin gtt -Plan for RHC/LHC today.  Needs HD prior given hyperkalemia this morning  Acute on chronic combined systolic and diastolic heart failure: EF 25 to 30%.  Concerning for worsening ischemic cardiomyopathy as above.  Planning cath today.  HD for volume removal.  AS: concern for low flow low gradient severe AS (Vmax  2.9 m/s, MG 15 mmHg, AVA 0.7 cm^2, DI 0.2).  Evaluate further with cath as above  HLD: lipitor 80 continue  HTN: continue home clonidine  ESRD on HD MWF  Tobacco use: counseled on risks of tobacco use and cessation strongly encouraged  For questions or updates, please contact [employer] Please consult [email or web address] for contact info under        Signed, Donato Heinz, MD  06/28/2020, 9:08 AM

## 2020-06-28 NOTE — Progress Notes (Signed)
Pt. agitated with the situation.

## 2020-06-28 NOTE — Progress Notes (Signed)
Family Medicine Teaching Service Daily Progress Note Intern Pager: [phone]  Patient name: Marco Arnold Medical record number: [id number] Date of birth: [birth year] Age: 54 y.o. Gender: male  Primary Care Provider: Cyndi Bender, PA-C Consultants: Cardiology  Code Status: Full  Pt Overview and Major Events to Date:  Admitted 10/31  Assessment and Plan:  Marco Arnold is a 54 y.o. male smoker presenting with chest tightness and dyspnea likely due to CHF vs ACS. PMH is significant for ESRD, CAD s/p PCI of LAD (06/2019), HTN, AS and chronic low back pain.  Chest tightness   CHF vs ACS Initially patient presented with chest tightness and worsening dyspnea in the setting of bilaterally lower extremity edema for the last few weeks. History significant for CAD with prior LAD stent in 06/2019. Initially EKG demonstrated no ST elevations. CXR demonstrated enlarged cardiac silhouette and calcific atherosclerotic disease of the aorta, no focal airspace consolidation. Troponins trended 279>263>302. Echo demonstrated worsening EF 25-30% with severely decreased function of the LV and LV global hyperkinesis. Comparable to last echo a year ago that was notable for EF 35-40%.  - cardiology following, appreciate continued involvement - f/u cardiac catheterization today - heparin gtt per cardiology - continue ASA 81 mg - continue atorvastatin 80 mg - holding carvedilol in setting of acute CHF - trend troponin - AM labs: renal function panel, CBC - vitals per unit routine - cardiac monitoring - daily weights  ESRD HD MWF schedule. Likely secondary to hypertensive renal disease. Patient initially denied inpatient dialysis because not able to tolerate as well in the setting of chronic back pain. Cr 10.03, BUN 94 and GFR 6 compared to on admission Cr 9.09, BUN 81 and GFR 6. Cardiology will not perform cath unless patient attends dialysis. Patient agreeable to inpatient after extensive discussion.  -  nephrology aware, appreciate involvement and assistance  - continue Rena-Vit, lanthanum  HTN BP 119/91 this morning. BP 130/91 on admission. Home meds: amlodipine 10 mg, carvedilol 3.125 mg BID, clonidine 0.2 mg daily. Reported not taking amlodipine or carvedilol prior to admission. - continuing clonidine to avoid rebound HTN - hold other home meds due to soft BP  Chronic low back pain History of chronic back pain and degenerative disc disease.  He states he has tried several medications and has been to pain clinics but the only thing that helps him is "horse Liniments". Home meds: cyclobenzaprine, naproxen prn.  - continue cyclobenzaprine - tylenol PRN for pain  - horse liniments not on formulary  GERD On pantoprazole 40 mg daily. - continue PPI  Tobacco use disorder Smokes about 1ppd. Started smoking when he was 54 yo.   - nicotine patch 21mg  - encouraged tobacco cessation    FEN/GI: NPO pending cath procedure  PPx: heparin      Disposition: inpatient, med telemetry       Subjective:  Denies current chest pain and worsening dyspnea. States that he is hungry and wants to leave since they have not performed the cath yet. Spent a great deal of time convincing the patient the risks of leaving especially given worsening EF since last year.   Objective: Temp:  [97.6 F (36.4 C)-97.8 F (36.6 C)] 97.6 F (36.4 C) (11/01 0345) Pulse Rate:  [46-89] 87 (11/01 0345) Resp:  [13-25] 18 (11/01 0345) BP: (96-136)/(53-104) 119/91 (11/01 0345) SpO2:  [89 %-100 %] 99 % (11/01 0345) Weight:  [103 kg-104.3 kg] 103 kg (11/01 0028) Physical Exam: General: Patient sitting upright, in no  acute distress. Cardiovascular: RRR, no murmurs or gallops auscultated  Respiratory: lungs clear to auscultation bilaterally, breathing comfortably on room air  Abdomen: soft, nontender, presence of active bowel sounds Extremities: 2+ pitting LE edema bilaterally, pedal edema noted bilaterally, LE  cool to touch Derm: scarring of previous healed lesions throughout entire body  Psych: expresses that he is agitated and hungry   Laboratory: Recent Labs  Lab 06/27/20 1234 06/28/20 0437  WBC 9.3 9.7  HGB 13.8 14.7  HCT 43.3 45.5  PLT 202 236   Recent Labs  Lab 06/27/20 1234 06/28/20 0437  NA 130* 129*  K 5.0 5.9*  CL 84* 86*  CO2 21* 16*  BUN 81* 94*  CREATININE 9.09* 10.03*  CALCIUM 9.1 9.2  GLUCOSE 101* 104*      Imaging/Diagnostic Tests: DG Chest 2 View  Result Date: 06/27/2020 CLINICAL DATA:  Chest pain and shortness of breath. EXAM: CHEST - 2 VIEW COMPARISON:  March 23, 2020 FINDINGS: Enlarged cardiac silhouette. Mediastinal contours appear intact. Calcific atherosclerotic disease of the aorta. There is no evidence of focal airspace consolidation, pleural effusion or pneumothorax. Osseous structures are without acute abnormality. Soft tissues are grossly normal. IMPRESSION: 1. Enlarged cardiac silhouette. 2. Calcific atherosclerotic disease of the aorta. 3. No focal airspace consolidation. Electronically Signed   By: Fidela Salisbury M.D.   On: 06/27/2020 12:45   ECHOCARDIOGRAM COMPLETE  Result Date: 06/27/2020    ECHOCARDIOGRAM REPORT   Patient Name:   Marco Arnold Date of Exam: 06/27/2020 Medical Rec #:  [id number]     Height:       75.5 in Accession #:    [id number]    Weight:       230.0 lb Date of Birth:  02-Dec-1965     BSA:          2.340 m Patient Age:    58 years      BP:           153/97 mmHg Patient Gender: M             HR:           84 bpm. Exam Location:  Inpatient Procedure: 2D Echo, Cardiac Doppler and Color Doppler Indications:    R94.31 Abnormal EKG  History:        Patient has no prior history of Echocardiogram examinations.                 CAD; Risk Factors:Hypertension.  Sonographer:    Merrie Roof RDCS Referring Phys: [id number] [place]  1. Left ventricular ejection fraction, by estimation, is 25 to 30%. The left ventricle has  severely decreased function. The left ventricle demonstrates global hypokinesis. The left ventricular internal cavity size was mildly dilated. Left ventricular diastolic parameters are indeterminate.  2. Right ventricular systolic function is moderately reduced. The right ventricular size is moderately enlarged. There is mildly elevated pulmonary artery systolic pressure. The estimated right ventricular systolic pressure is 79.0 mmHg.  3. Left atrial size was moderately dilated.  4. Right atrial size was moderately dilated.  5. The mitral valve is degenerative. Moderate mitral valve regurgitation. No evidence of mitral stenosis.  6. Tricuspid valve regurgitation is severe.  7. Low flow, low gradient aortic stenosis. DI is 0.20. The aortic valve is tricuspid. There is severe calcifcation of the aortic valve. There is severe thickening of the aortic valve. Aortic valve regurgitation is not visualized. Moderate to severe aortic valve stenosis. Aortic valve area, by  VTI measures 0.73 cm. Aortic valve mean gradient measures 15.0 mmHg. Aortic valve Vmax measures 2.89 m/s.  8. The inferior vena cava is dilated in size with >50% respiratory variability, suggesting right atrial pressure of 8 mmHg. FINDINGS  Left Ventricle: Left ventricular ejection fraction, by estimation, is 25 to 30%. The left ventricle has severely decreased function. The left ventricle demonstrates global hypokinesis. The left ventricular internal cavity size was mildly dilated. There is no left ventricular hypertrophy. Left ventricular diastolic parameters are indeterminate.  LV Wall Scoring: The anterior septum, inferior wall, mid inferolateral segment, mid inferoseptal segment, and basal inferoseptal segment are akinetic. Right Ventricle: The right ventricular size is moderately enlarged. No increase in right ventricular wall thickness. Right ventricular systolic function is moderately reduced. There is mildly elevated pulmonary artery systolic  pressure. The tricuspid regurgitant velocity is 2.53 m/s, and with an assumed right atrial pressure of 8 mmHg, the estimated right ventricular systolic pressure is 66.4 mmHg. Left Atrium: Left atrial size was moderately dilated. Right Atrium: Right atrial size was moderately dilated. Pericardium: There is no evidence of pericardial effusion. Mitral Valve: The mitral valve is degenerative in appearance. Moderate mitral valve regurgitation. No evidence of mitral valve stenosis. Tricuspid Valve: The tricuspid valve is normal in structure. Tricuspid valve regurgitation is severe. No evidence of tricuspid stenosis. Aortic Valve: Low flow, low gradient aortic stenosis. DI is 0.20. The aortic valve is tricuspid. There is severe calcifcation of the aortic valve. There is severe thickening of the aortic valve. Aortic valve regurgitation is not visualized. Moderate to severe aortic stenosis is present. Aortic valve mean gradient measures 15.0 mmHg. Aortic valve peak gradient measures 33.4 mmHg. Aortic valve area, by VTI measures 0.73 cm. Pulmonic Valve: The pulmonic valve was normal in structure. Pulmonic valve regurgitation is not visualized. No evidence of pulmonic stenosis. Aorta: The aortic root is normal in size and structure. Venous: The inferior vena cava is dilated in size with greater than 50% respiratory variability, suggesting right atrial pressure of 8 mmHg. IAS/Shunts: No atrial level shunt detected by color flow Doppler.  LEFT VENTRICLE PLAX 2D LVIDd:         6.80 cm LVIDs:         6.10 cm LV PW:         1.20 cm LV IVS:        1.10 cm LVOT diam:     2.10 cm LV SV:         32 LV SV Index:   14 LVOT Area:     3.46 cm  RIGHT VENTRICLE RV Basal diam:  5.80 cm RV Mid diam:    5.50 cm RV S prime:     8.41 cm/s TAPSE (M-mode): 2.3 cm LEFT ATRIUM              Index       RIGHT ATRIUM           Index LA diam:        5.10 cm  2.18 cm/m  RA Area:     28.20 cm LA Vol (A2C):   108.0 ml 46.15 ml/m RA Volume:   108.00 ml  46.15 ml/m LA Vol (A4C):   114.0 ml 48.71 ml/m LA Biplane Vol: 115.0 ml 49.14 ml/m  AORTIC VALVE AV Area (Vmax):    0.74 cm AV Area (Vmean):   0.67 cm AV Area (VTI):     0.73 cm AV Vmax:  289.00 cm/s AV Vmean:          180.000 cm/s AV VTI:            0.442 m AV Peak Grad:      33.4 mmHg AV Mean Grad:      15.0 mmHg LVOT Vmax:         62.00 cm/s LVOT Vmean:        34.700 cm/s LVOT VTI:          0.093 m LVOT/AV VTI ratio: 0.21  AORTA Ao Root diam: 3.50 cm MR Peak grad: 114.1 mmHg  TRICUSPID VALVE MR Mean grad: 61.0 mmHg   TR Peak grad:   25.6 mmHg MR Vmax:      534.00 cm/s TR Vmax:        253.00 cm/s MR Vmean:     365.0 cm/s                           SHUNTS                           Systemic VTI:  0.09 m                           Systemic Diam: 2.10 cm Candee Furbish MD Electronically signed by Candee Furbish MD Signature Date/Time: 06/27/2020/4:48:49 PM    Final     Donney Dice, DO 06/28/2020, 6:52 AM PGY-1, [place] Intern pager: [phone], text pages welcome

## 2020-06-28 NOTE — Consult Note (Signed)
[facility] Renal Consultation Note    Indication for Consultation:  Management of ESRD/hemodialysis, anemia, hypertension/volume, and secondary hyperparathyroidism.  HPI: Marco Arnold is a 54 y.o. male with PMH including ESRD on dialysis, CAD, AS, HTN, and neuropathy who presented to [facility] ED on 06/28/20 with chest tightness, shortness of breath and diaphoresis. Initial troponin was elevated at 279. CXR showed enlarged cardiac silhouette. Cardiology was consulted and recommended stat TTE which showed worsening EF 20-25% and planned for heart cath this AM. Started on heparin drip. K+ initially 5.0 with Cr 9.09, BUN 101. However, K+ further elevated this AM to 5.9 and cardiology requested hemodialysis prior to cardiac cath today.   Patient dialyzes on MWF schedule at [facility] His last treatment was 06/25/20. Recently referred for a fistulogram due to low clearances. He reports he needs to complete dialysis in a recliner due to his back pain. Reports he has also had worsening foot pain and swelling over the past few months. No CP or SOB reported at present. No fever, nausea, vomiting, or diarrhea reported.    Past Medical History:  Diagnosis Date  . Arthritis    spine  . Chronic kidney disease    [place] MWF  . Coronary artery disease   . Dialysis patient [facility]) 2013  . Diverticulitis   . Hyperlipidemia   . Hypertension    dr  Tobie Lords      @ [facility]  med  . Neuromuscular disorder ([place])    nerve pain - tx w/cymbalta  . Neuropathic pain 09/20/2017   Left mid-thoracic   Past Surgical History:  Procedure Laterality Date  . AV FISTULA PLACEMENT  11/10/2011   Procedure: ARTERIOVENOUS (AV) FISTULA CREATION;  Surgeon: Mal Misty, MD;  Location: [place];  Service: Vascular;  Laterality: Left;  Ultrasound guided  . CARDIAC CATHETERIZATION  09/19/2018   On stent study at [facility]  . COLONOSCOPY    . INSERTION OF DIALYSIS CATHETER  02/09/2012   Procedure:  INSERTION OF DIALYSIS CATHETER;  Surgeon: Mal Misty, MD;  Location: [place];  Service: Vascular;  Laterality: N/A;  . MASS EXCISION Right 09/07/2018   Procedure: EXCISION MASS RIGHT THUMB;  Surgeon: Charlotte Crumb, MD;  Location: [place];  Service: Orthopedics;  Laterality: Right;  OR AXILLARY BLOCK  . REVISON OF ARTERIOVENOUS FISTULA Left 04/08/2019   Procedure: REVISION PLICATION OF LEFT ARM ARTERIOVENOUS FISTULA;  Surgeon: Serafina Mitchell, MD;  Location: [place];  Service: Vascular;  Laterality: Left;  . SHUNTOGRAM Left 06/03/2012   Procedure: SHUNTOGRAM;  Surgeon: Conrad Eden, MD;  Location: [facility] CATH LAB;  Service: Cardiovascular;  Laterality: Left;  . SHUNTOGRAM Left 01/02/2013   Procedure: SHUNTOGRAM;  Surgeon: Conrad Albert Lea, MD;  Location: [facility] CATH LAB;  Service: Cardiovascular;  Laterality: Left;   Family History  Problem Relation Age of Onset  . Pancreatic cancer Mother   . Diabetes Father   . Hypertension Father   . Cancer - Lung Paternal Grandfather    Social History:  reports that he has been smoking cigarettes. He has a 10.75 pack-year smoking history. He has never used smokeless tobacco. He reports current alcohol use. He reports current drug use. Drug: Marijuana.  ROS: As per HPI otherwise negative.  Physical Exam: Vitals:   06/28/20 0815 06/28/20 1010 06/28/20 1015 06/28/20 1030  BP: (!) 138/107 (!) 146/100 (!) 142/100 (!) 156/105  Pulse: 82 81 80 85  Resp: 19 18    Temp: 97.6  F (36.4 C) (!) 97.1 F (36.2 C)    TempSrc: Oral Axillary    SpO2: 99% 99%    Weight:      Height:         General: Well developed, well nourished, in no acute distress. Seated on bedside Head: Normocephalic, atraumatic, sclera non-icteric, mucus membranes are moist. Neck: JVD not elevated. Lungs: Clear bilaterally to auscultation without wheezes, rales, or rhonchi. Breathing is unlabored. Heart: RRR with normal S1, S2. No murmurs, rubs, or gallops appreciated. Abdomen: Soft, non-distended  with normoactive bowel sounds. No rebound/guarding. No obvious abdominal masses. Musculoskeletal:  Strength and tone appear normal for age. Lower extremities: 1+ edema bilateral lower extremities Neuro: Alert and oriented X 3. Moves all extremities spontaneously. Psych:  Responds to questions appropriately with a normal affect. Dialysis Access: LUE AVF + bruit  Allergies  Allergen Reactions  . Lisinopril Shortness Of Breath    Unable to breath   Prior to Admission medications   Medication Sig Start Date End Date Taking? Authorizing Provider  acetaminophen (TYLENOL 8 HOUR) 650 MG CR tablet Take 1 tablet (650 mg total) by mouth every 8 (eight) hours as needed for pain. 03/23/20  Yes Petrucelli, Glynda Jaeger, PA-C  aspirin EC 81 MG tablet Take 81 mg by mouth daily.   Yes [provider]  cloNIDine (CATAPRES) 0.1 MG tablet Take 0.2 mg by mouth daily. 07/13/16  Yes [provider]  cyclobenzaprine (FLEXERIL) 10 MG tablet Take 20 mg by mouth 2 (two) times daily as needed for muscle spasms.    Yes [provider]  FOSRENOL 1000 MG chewable tablet Chew 6,000 mg by mouth 3 (three) times daily with meals.  08/31/17  Yes [provider]  hydrOXYzine (ATARAX/VISTARIL) 25 MG tablet Take 25 mg by mouth 2 (two) times daily as needed for anxiety or itching (Sleep).    Yes [provider]  lidocaine (LIDODERM) 5 % Place 1 patch onto the skin daily as needed. Apply patch to area most significant pain once per day.  Remove and discard patch within 12 hours of application. Patient taking differently: Place 1 patch onto the skin daily as needed (Apply patch to area most significant pain once per day.  Remove and discard patch within 12 hours of application.).  03/23/20  Yes Petrucelli, Laquincy Eastridge R, PA-C  multivitamin (RENA-VIT) TABS tablet Take 1 tablet by mouth See admin instructions. Every Monday, Wednesday and Friday   Yes [provider]  naproxen sodium (ALEVE)  220 MG tablet Take 220 mg by mouth 2 (two) times daily as needed (pain).    Yes [provider]  pantoprazole (PROTONIX) 40 MG tablet Take 40 mg by mouth daily.  12/04/12  Yes [provider]  VENTOLIN HFA 108 (90 Base) MCG/ACT inhaler Inhale 2 puffs into the lungs every 6 (six) hours as needed for wheezing or shortness of breath.  12/21/17  Yes [provider]  amLODipine (NORVASC) 10 MG tablet Take 10 mg by mouth at bedtime.  Patient not taking: Reported on 06/15/2020    [provider]  atorvastatin (LIPITOR) 80 MG tablet Take 80 mg by mouth daily.  Patient not taking: Reported on 06/15/2020 01/16/19   [provider]  carvedilol (COREG) 3.125 MG tablet Take 3.125 mg by mouth 2 (two) times daily with a meal.  Patient not taking: Reported on 06/15/2020 01/16/19   [provider]  DULoxetine (CYMBALTA) 60 MG capsule Take 60 mg by mouth every morning.  Patient  not taking: Reported on 06/15/2020 01/29/18   [provider]  gabapentin (NEURONTIN) 100 MG capsule Take 3 capsules (300 mg total) by mouth at bedtime. Patient not taking: Reported on 06/15/2020 09/20/17   Kathrynn Ducking, MD  HYDROcodone-acetaminophen [facility]) 5-325 MG tablet Take 1 tablet by mouth every 6 (six) hours as needed for moderate pain. Patient not taking: Reported on 06/15/2020 04/08/19   Dagoberto Ligas, PA-C  nortriptyline (PAMELOR) 25 MG capsule Take 25 mg by mouth every morning.  Patient not taking: Reported on 06/15/2020 01/29/18   [provider]   Current Facility-Administered Medications  Medication Dose Route Frequency Provider Last Rate Last Admin  . 0.9 %  sodium chloride infusion  250 mL Intravenous PRN Isaiah Serge, NP      . 0.9 %  sodium chloride infusion   Intravenous Continuous Isaiah Serge, NP 10 mL/hr at 06/28/20 0559 New Bag at 06/28/20 0559  . acetaminophen (TYLENOL) tablet 650 mg  650 mg Oral Q6H PRN Benay Pike, MD   650 mg at  06/27/20 2206   Or  . acetaminophen (TYLENOL) suppository 650 mg  650 mg Rectal Q6H PRN Benay Pike, MD      . albuterol (VENTOLIN HFA) 108 (90 Base) MCG/ACT inhaler 2 puff  2 puff Inhalation Q6H PRN Benay Pike, MD      . aspirin EC tablet 81 mg  81 mg Oral Daily Zola Button, MD      . atorvastatin (LIPITOR) tablet 80 mg  80 mg Oral Daily Zola Button, MD      . Chlorhexidine Gluconate Cloth 2 % PADS 6 each  6 each Topical Q0600 Roney Jaffe, MD      . cloNIDine (CATAPRES) tablet 0.2 mg  0.2 mg Oral Daily Benay Pike, MD   0.2 mg at 06/28/20 0857  . cyclobenzaprine (FLEXERIL) tablet 20 mg  20 mg Oral BID PRN Benay Pike, MD      . diclofenac Sodium (VOLTAREN) 1 % topical gel 4 g  4 g Topical QID PRN Darrelyn Hillock N, DO   4 g at 06/28/20 0554  . heparin ADULT infusion 100 units/mL (25000 units/292mL sodium chloride 0.45%)  1,550 Units/hr Intravenous Continuous Lenoria Chime, MD 15.5 mL/hr at 06/28/20 0750 1,550 Units/hr at 06/28/20 0750  . HYDROcodone-acetaminophen (NORCO/VICODIN) 5-325 MG per tablet 1-2 tablet  1-2 tablet Oral Q4H PRN Roney Jaffe, MD   2 tablet at 06/28/20 0942  . hydrOXYzine (ATARAX/VISTARIL) tablet 25 mg  25 mg Oral BID PRN Benay Pike, MD   25 mg at 06/28/20 0515  . lanthanum (FOSRENOL) chewable tablet 4,000 mg  4,000 mg Oral TID WC Benay Pike, MD      . lidocaine (LIDODERM) 5 % 1 patch  1 patch Transdermal Daily PRN Benay Pike, MD      . multivitamin (RENA-VIT) tablet 1 tablet  1 tablet Oral Q M,W,F Benay Pike, MD      . nicotine (NICODERM CQ - dosed in mg/24 hours) patch 21 mg  21 mg Transdermal Daily Benay Pike, MD      . pantoprazole (PROTONIX) EC tablet 40 mg  40 mg Oral Daily Benay Pike, MD      . polyethylene glycol (MIRALAX / GLYCOLAX) packet 17 g  17 g Oral Daily PRN Benay Pike, MD      . sodium chloride flush (NS) 0.9 % injection 3 mL  3 mL Intravenous Q12H  Isaiah Serge, NP   3 mL at 06/27/20 2220  .  sodium chloride flush (NS) 0.9 % injection 3 mL  3 mL Intravenous PRN Isaiah Serge, NP       Labs: Basic Metabolic Panel: Recent Labs  Lab 06/27/20 1234 06/28/20 0437  NA 130* 129*  K 5.0 5.9*  CL 84* 86*  CO2 21* 16*  GLUCOSE 101* 104*  BUN 81* 94*  CREATININE 9.09* 10.03*  CALCIUM 9.1 9.2  PHOS  --  >30.0*   Liver Function Tests: Recent Labs  Lab 06/28/20 0437  ALBUMIN 3.3*   CBC: Recent Labs  Lab 06/27/20 1234 06/28/20 0437  WBC 9.3 9.7  HGB 13.8 14.7  HCT 43.3 45.5  MCV 86.4 85.2  PLT 202 236   Studies/Results: DG Chest 2 View  Result Date: 06/27/2020 CLINICAL DATA:  Chest pain and shortness of breath. EXAM: CHEST - 2 VIEW COMPARISON:  March 23, 2020 FINDINGS: Enlarged cardiac silhouette. Mediastinal contours appear intact. Calcific atherosclerotic disease of the aorta. There is no evidence of focal airspace consolidation, pleural effusion or pneumothorax. Osseous structures are without acute abnormality. Soft tissues are grossly normal. IMPRESSION: 1. Enlarged cardiac silhouette. 2. Calcific atherosclerotic disease of the aorta. 3. No focal airspace consolidation. Electronically Signed   By: Fidela Salisbury M.D.   On: 06/27/2020 12:45   ECHOCARDIOGRAM COMPLETE  Result Date: 06/27/2020    ECHOCARDIOGRAM REPORT   Patient Name:   Marco Arnold Date of Exam: 06/27/2020 Medical Rec #:  [id number]     Height:       75.5 in Accession #:    [id number]    Weight:       230.0 lb Date of Birth:  05-02-1966     BSA:          2.340 m Patient Age:    80 years      BP:           153/97 mmHg Patient Gender: M             HR:           84 bpm. Exam Location:  Inpatient Procedure: 2D Echo, Cardiac Doppler and Color Doppler Indications:    R94.31 Abnormal EKG  History:        Patient has no prior history of Echocardiogram examinations.                 CAD; Risk Factors:Hypertension.  Sonographer:    Merrie Roof RDCS Referring Phys: [id number] [place]  1. Left  ventricular ejection fraction, by estimation, is 25 to 30%. The left ventricle has severely decreased function. The left ventricle demonstrates global hypokinesis. The left ventricular internal cavity size was mildly dilated. Left ventricular diastolic parameters are indeterminate.  2. Right ventricular systolic function is moderately reduced. The right ventricular size is moderately enlarged. There is mildly elevated pulmonary artery systolic pressure. The estimated right ventricular systolic pressure is 55.7 mmHg.  3. Left atrial size was moderately dilated.  4. Right atrial size was moderately dilated.  5. The mitral valve is degenerative. Moderate mitral valve regurgitation. No evidence of mitral stenosis.  6. Tricuspid valve regurgitation is severe.  7. Low flow, low gradient aortic stenosis. DI is 0.20. The aortic valve is tricuspid. There is severe calcifcation of the aortic valve. There is severe thickening of the aortic valve. Aortic valve regurgitation is not visualized. Moderate to severe aortic valve stenosis. Aortic valve area, by VTI measures 0.73  cm. Aortic valve mean gradient measures 15.0 mmHg. Aortic valve Vmax measures 2.89 m/s.  8. The inferior vena cava is dilated in size with >50% respiratory variability, suggesting right atrial pressure of 8 mmHg. FINDINGS  Left Ventricle: Left ventricular ejection fraction, by estimation, is 25 to 30%. The left ventricle has severely decreased function. The left ventricle demonstrates global hypokinesis. The left ventricular internal cavity size was mildly dilated. There is no left ventricular hypertrophy. Left ventricular diastolic parameters are indeterminate.  LV Wall Scoring: The anterior septum, inferior wall, mid inferolateral segment, mid inferoseptal segment, and basal inferoseptal segment are akinetic. Right Ventricle: The right ventricular size is moderately enlarged. No increase in right ventricular wall thickness. Right ventricular systolic  function is moderately reduced. There is mildly elevated pulmonary artery systolic pressure. The tricuspid regurgitant velocity is 2.53 m/s, and with an assumed right atrial pressure of 8 mmHg, the estimated right ventricular systolic pressure is 91.4 mmHg. Left Atrium: Left atrial size was moderately dilated. Right Atrium: Right atrial size was moderately dilated. Pericardium: There is no evidence of pericardial effusion. Mitral Valve: The mitral valve is degenerative in appearance. Moderate mitral valve regurgitation. No evidence of mitral valve stenosis. Tricuspid Valve: The tricuspid valve is normal in structure. Tricuspid valve regurgitation is severe. No evidence of tricuspid stenosis. Aortic Valve: Low flow, low gradient aortic stenosis. DI is 0.20. The aortic valve is tricuspid. There is severe calcifcation of the aortic valve. There is severe thickening of the aortic valve. Aortic valve regurgitation is not visualized. Moderate to severe aortic stenosis is present. Aortic valve mean gradient measures 15.0 mmHg. Aortic valve peak gradient measures 33.4 mmHg. Aortic valve area, by VTI measures 0.73 cm. Pulmonic Valve: The pulmonic valve was normal in structure. Pulmonic valve regurgitation is not visualized. No evidence of pulmonic stenosis. Aorta: The aortic root is normal in size and structure. Venous: The inferior vena cava is dilated in size with greater than 50% respiratory variability, suggesting right atrial pressure of 8 mmHg. IAS/Shunts: No atrial level shunt detected by color flow Doppler.  LEFT VENTRICLE PLAX 2D LVIDd:         6.80 cm LVIDs:         6.10 cm LV PW:         1.20 cm LV IVS:        1.10 cm LVOT diam:     2.10 cm LV SV:         32 LV SV Index:   14 LVOT Area:     3.46 cm  RIGHT VENTRICLE RV Basal diam:  5.80 cm RV Mid diam:    5.50 cm RV S prime:     8.41 cm/s TAPSE (M-mode): 2.3 cm LEFT ATRIUM              Index       RIGHT ATRIUM           Index LA diam:        5.10 cm  2.18 cm/m   RA Area:     28.20 cm LA Vol (A2C):   108.0 ml 46.15 ml/m RA Volume:   108.00 ml 46.15 ml/m LA Vol (A4C):   114.0 ml 48.71 ml/m LA Biplane Vol: 115.0 ml 49.14 ml/m  AORTIC VALVE AV Area (Vmax):    0.74 cm AV Area (Vmean):   0.67 cm AV Area (VTI):     0.73 cm AV Vmax:           289.00 cm/s AV  Vmean:          180.000 cm/s AV VTI:            0.442 m AV Peak Grad:      33.4 mmHg AV Mean Grad:      15.0 mmHg LVOT Vmax:         62.00 cm/s LVOT Vmean:        34.700 cm/s LVOT VTI:          0.093 m LVOT/AV VTI ratio: 0.21  AORTA Ao Root diam: 3.50 cm MR Peak grad: 114.1 mmHg  TRICUSPID VALVE MR Mean grad: 61.0 mmHg   TR Peak grad:   25.6 mmHg MR Vmax:      534.00 cm/s TR Vmax:        253.00 cm/s MR Vmean:     365.0 cm/s                           SHUNTS                           Systemic VTI:  0.09 m                           Systemic Diam: 2.10 cm Candee Furbish MD Electronically signed by Candee Furbish MD Signature Date/Time: 06/27/2020/4:48:49 PM    Final     Dialysis Orders: Center: [facility]  on MWF. 180NRe Time: 4.5 hours, BFR 450, DFR Auto 2.0, EDW 99.5kg, 2K/2Ca, UF profile 2, AVF 15g Max UF goal 9L Heparin 3000 unit bolus Hectorol 7 mcg IVP q HD Sensipar 60mg  PO q HD Lanthanum 4 tabs PO q meal  Assessment/Plan: 1.  NSTEMI: Troponin elevated but flat. Echo showed worsening CHF with EF 25-30%. On heparin drip and cardiology planning cardiac cath, but needs HD first due to hyperkalemia. Will plan for HD today as below. 2. Hyperkalemia: K+ 5.9. Will correct with HD today, next available seat. 3.  ESRD:  On MWF schedule. Has recently been referred for fistulogram, will consider completing inpatient if he has any issues with cannulation here. 4.  Hypertension/volume: BP elevated. New peripheral edema likely 2/2 worsening heart failure. May need EDW lowered, follow weight post HD today. Home BP meds include amlodipine, carvedilol, clonidine, and losartan.  5.  Anemia: Hgb >12, no ESA  indicated.  6.  Metabolic bone disease: Phos >30 on labs today. Outpatient phos usually around 13. Pt is not compliant with renal diet. Continue lanthanum and will recheck phos with AM labs.  7.  Nutrition:  Alb 3.3. Currently NPO.  Anice Paganini, PA-C 06/28/2020, 11:02 AM  [facility] Pager: [phone]

## 2020-06-28 NOTE — Progress Notes (Addendum)
ANTICOAGULATION CONSULT NOTE - Initial Consult  Pharmacy Consult for Heparin Indication: chest pain/ACS  Allergies  Allergen Reactions  . Lisinopril Shortness Of Breath    Unable to breath    Patient Measurements: Height: 6' 3.5" (191.8 cm) Weight: 103 kg (227 lb 1.6 oz) IBW/kg (Calculated) : 85.65 Heparin Dosing Weight: 104kg  Vital Signs: Temp: 97.6 F (36.4 C) (11/01 0345) Temp Source: Oral (11/01 0345) BP: 119/91 (11/01 0345) Pulse Rate: 87 (11/01 0345)  Labs: Recent Labs    06/27/20 1234 06/27/20 1234 06/27/20 1424 06/27/20 1937 06/27/20 2123 06/28/20 0437  HGB 13.8  --   --   --   --  14.7  HCT 43.3  --   --   --   --  45.5  PLT 202  --   --   --   --  236  HEPARINUNFRC  --   --   --   --   --  0.27*  CREATININE 9.09*  --   --   --   --  10.03*  TROPONINIHS 257*   < > 279* 263* 302*  --    < > = values in this interval not displayed.    Estimated Creatinine Clearance: 11 mL/min (A) (by C-G formula based on SCr of 10.03 mg/dL (H)).   Medical History: Past Medical History:  Diagnosis Date  . Arthritis    spine  . Chronic kidney disease    [place] MWF  . Coronary artery disease   . Dialysis patient [facility]) 2013  . Diverticulitis   . Hyperlipidemia   . Hypertension    dr  Tobie Lords      @ [facility]  med  . Neuromuscular disorder ([place])    nerve pain - tx w/cymbalta  . Neuropathic pain 09/20/2017   Left mid-thoracic   Assessment: 54 yo M with hx LAD stent 06/2019 with chest pain. No AC PTA. Pharmacy consulted for heparin. Planned LHC 11/1.  Heparin came back slightly subtherapeutic today at 0.27. We will increase rate and check another 8 hr level or f/u after cath.   Cbc stable ESRD  Goal of Therapy:  Heparin level 0.3-0.7 units/ml Monitor platelets by anticoagulation protocol: Yes   Plan:  Increase heparin to 1650 unit/hr F/u 8 hr HL or cath Monitor daily HL, CBC/plt  Onnie Boer, PharmD, BCIDP, AAHIVP, CPP Infectious Disease  Pharmacist 06/28/2020 7:28 AM

## 2020-06-28 NOTE — Progress Notes (Signed)
Patient K is 5.9 this am, will need HD before his cath can be done.  [facility] Cath Lab

## 2020-06-28 NOTE — Interval H&P Note (Signed)
Cath Lab Visit (complete for each Cath Lab visit)  Clinical Evaluation Leading to the Procedure:   ACS: Yes.    Non-ACS:    Anginal Classification: CCS II  Anti-ischemic medical therapy: No Therapy  Non-Invasive Test Results: No non-invasive testing performed  Prior CABG: No previous CABG      History and Physical Interval Note:  06/28/2020 3:14 PM  Marco Arnold  has presented today for surgery, with the diagnosis of NSTEMI.  The various methods of treatment have been discussed with the patient and family. After consideration of risks, benefits and other options for treatment, the patient has consented to  Procedure(s): RIGHT/LEFT HEART CATH AND CORONARY ANGIOGRAPHY (N/A) as a surgical intervention.  The patient's history has been reviewed, patient examined, no change in status, stable for surgery.  I have reviewed the patient's chart and labs.  Questions were answered to the patient's satisfaction.     Quay Burow

## 2020-06-28 NOTE — Progress Notes (Signed)
[place] for Heparin Indication: chest pain/ACS  Allergies  Allergen Reactions  . Lisinopril Shortness Of Breath    Unable to breath    Patient Measurements: Height: 6' 3.5" (191.8 cm) Weight: 103.7 kg (228 lb 9.9 oz) (standing) IBW/kg (Calculated) : 85.65 Heparin Dosing Weight: 104kg  Vital Signs: Temp: 97.7 F (36.5 C) (11/01 1430) Temp Source: Oral (11/01 1430) BP: 138/101 (11/01 1633) Pulse Rate: 88 (11/01 1633)  Labs: Recent Labs    06/27/20 1234 06/27/20 1234 06/27/20 1424 06/27/20 1937 06/27/20 2123 06/28/20 0437  HGB 13.8  --   --   --   --  14.7  HCT 43.3  --   --   --   --  45.5  PLT 202  --   --   --   --  236  HEPARINUNFRC  --   --   --   --   --  0.27*  CREATININE 9.09*  --   --   --   --  10.03*  TROPONINIHS 257*   < > 279* 263* 302*  --    < > = values in this interval not displayed.    Estimated Creatinine Clearance: 11.1 mL/min (A) (by C-G formula based on SCr of 10.03 mg/dL (H)).   Medical History: Past Medical History:  Diagnosis Date  . Arthritis    spine  . Chronic kidney disease    [place] MWF  . Coronary artery disease   . Dialysis patient [facility]) 2013  . Diverticulitis   . Hyperlipidemia   . Hypertension    dr  Tobie Lords      @ [facility]  med  . Neuromuscular disorder ([place])    nerve pain - tx w/cymbalta  . Neuropathic pain 09/20/2017   Left mid-thoracic   Assessment: 54 yo M with hx LAD stent 06/2019 with chest pain. No AC PTA. Pharmacy consulted for heparin.   Underwent cath on 11/1 finding 80% ost-distal LM lesion and hemodynamic findings consistent with aortic valve stenosis - plan for CABG evaluation. Discussed with Dr Alvester Chou - okay to restart heparin infusion without bolus in 4 hours (sheath was removed on 11/1@1603 ). Hgb 14.7, plt 236. No s/sx of bleeding.   Heparin rate was increased earlier from 1400 units to 1550 units/hr for subtherapeutic level of 0.27. Pt has known ESRD.    Goal of Therapy:  Heparin level 0.3-0.7 units/ml Monitor platelets by anticoagulation protocol: Yes   Plan:  Restart heparin infusion at 1550 unit/hr on 11/1 at 2100 F/u 8 hr HL Monitor daily HL, CBC/plt  Antonietta Jewel, PharmD, [place] Pharmacist  Phone: [phone] 06/28/2020 5:00 PM  Please check AMION for all [place] phone numbers After 10:00 PM, call [place] [phone]

## 2020-06-28 NOTE — Progress Notes (Addendum)
Progress Note  Patient Name: Marco Arnold Date of Encounter: 06/28/2020  [place] Cardiologist: No primary care provider on file.   Subjective   He is frustrated this morning, wants to go home.  Denies any further chest pain.  Inpatient Medications    Scheduled Meds: . aspirin EC  81 mg Oral Daily  . atorvastatin  80 mg Oral Daily  . cloNIDine  0.2 mg Oral Daily  . lanthanum  4,000 mg Oral TID WC  . multivitamin  1 tablet Oral Q M,W,F  . nicotine  21 mg Transdermal Daily  . pantoprazole  40 mg Oral Daily  . sodium chloride flush  3 mL Intravenous Q12H   Continuous Infusions: . sodium chloride    . sodium chloride 10 mL/hr at 06/28/20 0559  . heparin 1,550 Units/hr (06/28/20 0750)   PRN Meds: sodium chloride, acetaminophen **OR** acetaminophen, albuterol, cyclobenzaprine, diclofenac Sodium, hydrOXYzine, lidocaine, polyethylene glycol, sodium chloride flush   Vital Signs    Vitals:   06/27/20 2036 06/28/20 0028 06/28/20 0345 06/28/20 0815  BP: (!) 126/93  (!) 119/91 (!) 138/107  Pulse: 81  87 82  Resp: 18  18 19   Temp: 97.7 F (36.5 C)  97.6 F (36.4 C) 97.6 F (36.4 C)  TempSrc: Oral  Oral Oral  SpO2: 100%  99% 99%  Weight:  103 kg    Height:        Intake/Output Summary (Last 24 hours) at 06/28/2020 0908 Last data filed at 06/27/2020 2300 Gross per 24 hour  Intake 360 ml  Output --  Net 360 ml   Last 3 Weights 06/28/2020 06/27/2020 06/15/2020  Weight (lbs) 227 lb 1.6 oz 230 lb 227 lb  Weight (kg) 103.012 kg 104.327 kg 102.967 kg      Telemetry    SR in 80s - Personally Reviewed  ECG    No new ECG- Personally Reviewed  Physical Exam   GEN: No acute distress.   Neck: No JVD Cardiac: RRR, 3/6 systolic murmur loudest at RUSB Respiratory: Clear to auscultation bilaterally. GI: Soft, nontender, non-distended  MS: 1+ edema Neuro:  Nonfocal  Psych: Normal affect   Labs    High Sensitivity Troponin:   Recent Labs  Lab 06/27/20 1234  06/27/20 1424 06/27/20 1937 06/27/20 2123  TROPONINIHS 257* 279* 263* 302*      Chemistry Recent Labs  Lab 06/27/20 1234 06/28/20 0437  NA 130* 129*  K 5.0 5.9*  CL 84* 86*  CO2 21* 16*  GLUCOSE 101* 104*  BUN 81* 94*  CREATININE 9.09* 10.03*  CALCIUM 9.1 9.2  ALBUMIN  --  3.3*  GFRNONAA 6* 6*  ANIONGAP 25* 27*     Hematology Recent Labs  Lab 06/27/20 1234 06/28/20 0437  WBC 9.3 9.7  RBC 5.01 5.34  HGB 13.8 14.7  HCT 43.3 45.5  MCV 86.4 85.2  MCH 27.5 27.5  MCHC 31.9 32.3  RDW 16.1* 16.3*  PLT 202 236    BNPNo results for input(s): BNP, PROBNP in the last 168 hours.   DDimer No results for input(s): DDIMER in the last 168 hours.   Radiology    DG Chest 2 View  Result Date: 06/27/2020 CLINICAL DATA:  Chest pain and shortness of breath. EXAM: CHEST - 2 VIEW COMPARISON:  March 23, 2020 FINDINGS: Enlarged cardiac silhouette. Mediastinal contours appear intact. Calcific atherosclerotic disease of the aorta. There is no evidence of focal airspace consolidation, pleural effusion or pneumothorax. Osseous structures are without acute  abnormality. Soft tissues are grossly normal. IMPRESSION: 1. Enlarged cardiac silhouette. 2. Calcific atherosclerotic disease of the aorta. 3. No focal airspace consolidation. Electronically Signed   By: Fidela Salisbury M.D.   On: 06/27/2020 12:45   ECHOCARDIOGRAM COMPLETE  Result Date: 06/27/2020    ECHOCARDIOGRAM REPORT   Patient Name:   Marco Arnold Date of Exam: 06/27/2020 Medical Rec #:  [id number]     Height:       75.5 in Accession #:    [id number]    Weight:       230.0 lb Date of Birth:  Sep 30, 1965     BSA:          2.340 m Patient Age:    54 years      BP:           153/97 mmHg Patient Gender: M             HR:           84 bpm. Exam Location:  Inpatient Procedure: 2D Echo, Cardiac Doppler and Color Doppler Indications:    R94.31 Abnormal EKG  History:        Patient has no prior history of Echocardiogram examinations.                  CAD; Risk Factors:Hypertension.  Sonographer:    Merrie Roof RDCS Referring Phys: [id number] [place]  1. Left ventricular ejection fraction, by estimation, is 25 to 30%. The left ventricle has severely decreased function. The left ventricle demonstrates global hypokinesis. The left ventricular internal cavity size was mildly dilated. Left ventricular diastolic parameters are indeterminate.  2. Right ventricular systolic function is moderately reduced. The right ventricular size is moderately enlarged. There is mildly elevated pulmonary artery systolic pressure. The estimated right ventricular systolic pressure is 61.6 mmHg.  3. Left atrial size was moderately dilated.  4. Right atrial size was moderately dilated.  5. The mitral valve is degenerative. Moderate mitral valve regurgitation. No evidence of mitral stenosis.  6. Tricuspid valve regurgitation is severe.  7. Low flow, low gradient aortic stenosis. DI is 0.20. The aortic valve is tricuspid. There is severe calcifcation of the aortic valve. There is severe thickening of the aortic valve. Aortic valve regurgitation is not visualized. Moderate to severe aortic valve stenosis. Aortic valve area, by VTI measures 0.73 cm. Aortic valve mean gradient measures 15.0 mmHg. Aortic valve Vmax measures 2.89 m/s.  8. The inferior vena cava is dilated in size with >50% respiratory variability, suggesting right atrial pressure of 8 mmHg. FINDINGS  Left Ventricle: Left ventricular ejection fraction, by estimation, is 25 to 30%. The left ventricle has severely decreased function. The left ventricle demonstrates global hypokinesis. The left ventricular internal cavity size was mildly dilated. There is no left ventricular hypertrophy. Left ventricular diastolic parameters are indeterminate.  LV Wall Scoring: The anterior septum, inferior wall, mid inferolateral segment, mid inferoseptal segment, and basal inferoseptal segment are akinetic. Right Ventricle:  The right ventricular size is moderately enlarged. No increase in right ventricular wall thickness. Right ventricular systolic function is moderately reduced. There is mildly elevated pulmonary artery systolic pressure. The tricuspid regurgitant velocity is 2.53 m/s, and with an assumed right atrial pressure of 8 mmHg, the estimated right ventricular systolic pressure is 07.3 mmHg. Left Atrium: Left atrial size was moderately dilated. Right Atrium: Right atrial size was moderately dilated. Pericardium: There is no evidence of pericardial effusion. Mitral Valve: The mitral valve is degenerative in  appearance. Moderate mitral valve regurgitation. No evidence of mitral valve stenosis. Tricuspid Valve: The tricuspid valve is normal in structure. Tricuspid valve regurgitation is severe. No evidence of tricuspid stenosis. Aortic Valve: Low flow, low gradient aortic stenosis. DI is 0.20. The aortic valve is tricuspid. There is severe calcifcation of the aortic valve. There is severe thickening of the aortic valve. Aortic valve regurgitation is not visualized. Moderate to severe aortic stenosis is present. Aortic valve mean gradient measures 15.0 mmHg. Aortic valve peak gradient measures 33.4 mmHg. Aortic valve area, by VTI measures 0.73 cm. Pulmonic Valve: The pulmonic valve was normal in structure. Pulmonic valve regurgitation is not visualized. No evidence of pulmonic stenosis. Aorta: The aortic root is normal in size and structure. Venous: The inferior vena cava is dilated in size with greater than 50% respiratory variability, suggesting right atrial pressure of 8 mmHg. IAS/Shunts: No atrial level shunt detected by color flow Doppler.  LEFT VENTRICLE PLAX 2D LVIDd:         6.80 cm LVIDs:         6.10 cm LV PW:         1.20 cm LV IVS:        1.10 cm LVOT diam:     2.10 cm LV SV:         32 LV SV Index:   14 LVOT Area:     3.46 cm  RIGHT VENTRICLE RV Basal diam:  5.80 cm RV Mid diam:    5.50 cm RV S prime:     8.41 cm/s  TAPSE (M-mode): 2.3 cm LEFT ATRIUM              Index       RIGHT ATRIUM           Index LA diam:        5.10 cm  2.18 cm/m  RA Area:     28.20 cm LA Vol (A2C):   108.0 ml 46.15 ml/m RA Volume:   108.00 ml 46.15 ml/m LA Vol (A4C):   114.0 ml 48.71 ml/m LA Biplane Vol: 115.0 ml 49.14 ml/m  AORTIC VALVE AV Area (Vmax):    0.74 cm AV Area (Vmean):   0.67 cm AV Area (VTI):     0.73 cm AV Vmax:           289.00 cm/s AV Vmean:          180.000 cm/s AV VTI:            0.442 m AV Peak Grad:      33.4 mmHg AV Mean Grad:      15.0 mmHg LVOT Vmax:         62.00 cm/s LVOT Vmean:        34.700 cm/s LVOT VTI:          0.093 m LVOT/AV VTI ratio: 0.21  AORTA Ao Root diam: 3.50 cm MR Peak grad: 114.1 mmHg  TRICUSPID VALVE MR Mean grad: 61.0 mmHg   TR Peak grad:   25.6 mmHg MR Vmax:      534.00 cm/s TR Vmax:        253.00 cm/s MR Vmean:     365.0 cm/s                           SHUNTS  Systemic VTI:  0.09 m                           Systemic Diam: 2.10 cm Candee Furbish MD Electronically signed by Candee Furbish MD Signature Date/Time: 06/27/2020/4:48:49 PM    Final     Cardiac Studies   Echo 06/27/20: 1. Left ventricular ejection fraction, by estimation, is 25 to 30%. The  left ventricle has severely decreased function. The left ventricle  demonstrates global hypokinesis. The left ventricular internal cavity size  was mildly dilated. Left ventricular  diastolic parameters are indeterminate.  2. Right ventricular systolic function is moderately reduced. The right  ventricular size is moderately enlarged. There is mildly elevated  pulmonary artery systolic pressure. The estimated right ventricular  systolic pressure is 44.9 mmHg.  3. Left atrial size was moderately dilated.  4. Right atrial size was moderately dilated.  5. The mitral valve is degenerative. Moderate mitral valve regurgitation.  No evidence of mitral stenosis.  6. Tricuspid valve regurgitation is severe.  7. Low  flow, low gradient aortic stenosis. DI is 0.20. The aortic valve  is tricuspid. There is severe calcifcation of the aortic valve. There is  severe thickening of the aortic valve. Aortic valve regurgitation is not  visualized. Moderate to severe  aortic valve stenosis. Aortic valve area, by VTI measures 0.73 cm. Aortic  valve mean gradient measures 15.0 mmHg. Aortic valve Vmax measures 2.89  m/s.  8. The inferior vena cava is dilated in size with >50% respiratory  variability, suggesting right atrial pressure of 8 mmHg.   Patient Profile     54 y.o. male with a hx of CAD with s/p LAD PCI 06/2019 at [facility], AS, ESRD, tobacco use  Assessment & Plan    NSTEMI: flat troponin elevation (257 > 279 > 263 > 302), may represent demand ischemia in setting of decompensated heart failure though did present with chest pain.  H/o stent to LAD.  Echo shows worsening cardiomyopathy, EF 25 to 30% -Continue ASA, atorva 80 mg -Continue heparin gtt -Plan for RHC/LHC today.  Needs HD prior given hyperkalemia this morning  Acute on chronic combined systolic and diastolic heart failure: EF 25 to 30%.  Concerning for worsening ischemic cardiomyopathy as above.  Planning cath today.  HD for volume removal.  AS: concern for low flow low gradient severe AS (Vmax  2.9 m/s, MG 15 mmHg, AVA 0.7 cm^2, DI 0.2).  Evaluate further with cath as above  HLD: lipitor 80 continue  HTN: continue home clonidine  ESRD on HD MWF  Tobacco use: counseled on risks of tobacco use and cessation strongly encouraged  For questions or updates, please contact [place] Please consult [email or web address] for contact info under        Signed, Donato Heinz, MD  06/28/2020, 9:08 AM

## 2020-06-28 NOTE — Progress Notes (Signed)
TCTS consulted for CABG evaluation. °

## 2020-06-28 NOTE — Progress Notes (Signed)
Right wrist and bilateral groin clipped for cardiac cath procedure.

## 2020-06-29 ENCOUNTER — Other Ambulatory Visit: Payer: Self-pay | Admitting: *Deleted

## 2020-06-29 ENCOUNTER — Encounter (HOSPITAL_COMMUNITY): Payer: Self-pay | Admitting: Family Medicine

## 2020-06-29 DIAGNOSIS — R079 Chest pain, unspecified: Secondary | ICD-10-CM | POA: Diagnosis not present

## 2020-06-29 DIAGNOSIS — I2510 Atherosclerotic heart disease of native coronary artery without angina pectoris: Secondary | ICD-10-CM

## 2020-06-29 DIAGNOSIS — I361 Nonrheumatic tricuspid (valve) insufficiency: Secondary | ICD-10-CM

## 2020-06-29 DIAGNOSIS — I350 Nonrheumatic aortic (valve) stenosis: Secondary | ICD-10-CM

## 2020-06-29 DIAGNOSIS — I5043 Acute on chronic combined systolic (congestive) and diastolic (congestive) heart failure: Secondary | ICD-10-CM | POA: Diagnosis not present

## 2020-06-29 DIAGNOSIS — Z992 Dependence on renal dialysis: Secondary | ICD-10-CM

## 2020-06-29 DIAGNOSIS — I509 Heart failure, unspecified: Secondary | ICD-10-CM

## 2020-06-29 DIAGNOSIS — N186 End stage renal disease: Secondary | ICD-10-CM

## 2020-06-29 DIAGNOSIS — I214 Non-ST elevation (NSTEMI) myocardial infarction: Secondary | ICD-10-CM | POA: Diagnosis not present

## 2020-06-29 DIAGNOSIS — I340 Nonrheumatic mitral (valve) insufficiency: Secondary | ICD-10-CM

## 2020-06-29 LAB — HEPARIN LEVEL (UNFRACTIONATED)
Heparin Unfractionated: 0.11 IU/mL — ABNORMAL LOW (ref 0.30–0.70)
Heparin Unfractionated: 0.25 IU/mL — ABNORMAL LOW (ref 0.30–0.70)

## 2020-06-29 LAB — CBC
HCT: 46.4 % (ref 39.0–52.0)
Hemoglobin: 14.9 g/dL (ref 13.0–17.0)
MCH: 27.9 pg (ref 26.0–34.0)
MCHC: 32.1 g/dL (ref 30.0–36.0)
MCV: 86.9 fL (ref 80.0–100.0)
Platelets: 202 10*3/uL (ref 150–400)
RBC: 5.34 MIL/uL (ref 4.22–5.81)
RDW: 16.7 % — ABNORMAL HIGH (ref 11.5–15.5)
WBC: 9.9 10*3/uL (ref 4.0–10.5)
nRBC: 0.2 % (ref 0.0–0.2)

## 2020-06-29 LAB — RENAL FUNCTION PANEL
Albumin: 3.2 g/dL — ABNORMAL LOW (ref 3.5–5.0)
Anion gap: 22 — ABNORMAL HIGH (ref 5–15)
BUN: 63 mg/dL — ABNORMAL HIGH (ref 6–20)
CO2: 21 mmol/L — ABNORMAL LOW (ref 22–32)
Calcium: 8.8 mg/dL — ABNORMAL LOW (ref 8.9–10.3)
Chloride: 90 mmol/L — ABNORMAL LOW (ref 98–111)
Creatinine, Ser: 7.49 mg/dL — ABNORMAL HIGH (ref 0.61–1.24)
GFR, Estimated: 8 mL/min — ABNORMAL LOW (ref >60)
Glucose, Bld: 139 mg/dL — ABNORMAL HIGH (ref 70–99)
Phosphorus: 10.9 mg/dL — ABNORMAL HIGH (ref 2.5–4.6)
Potassium: 4.3 mmol/L (ref 3.5–5.1)
Sodium: 133 mmol/L — ABNORMAL LOW (ref 135–145)

## 2020-06-29 LAB — HEPATITIS B SURFACE ANTIBODY, QUANTITATIVE: Hep B S AB Quant (Post): 10.2 m[IU]/mL (ref >9.9)

## 2020-06-29 MED ORDER — CARVEDILOL 3.125 MG PO TABS
3.1250 mg | ORAL_TABLET | Freq: Two times a day (BID) | ORAL | Status: DC
  Administered 2020-06-29 – 2020-07-01 (×5): 3.125 mg via ORAL
  Filled 2020-06-29 (×6): qty 1

## 2020-06-29 MED ORDER — HEPARIN BOLUS VIA INFUSION
1000.0000 [IU] | Freq: Once | INTRAVENOUS | Status: AC
  Administered 2020-06-29: 1000 [IU] via INTRAVENOUS
  Filled 2020-06-29: qty 1000

## 2020-06-29 MED ORDER — HEPARIN BOLUS VIA INFUSION
2000.0000 [IU] | Freq: Once | INTRAVENOUS | Status: AC
  Administered 2020-06-29: 2000 [IU] via INTRAVENOUS
  Filled 2020-06-29: qty 2000

## 2020-06-29 NOTE — Progress Notes (Addendum)
Family Medicine Teaching Service Daily Progress Note Intern Pager: [phone]  Patient name: Marco Arnold Medical record number: [id number] Date of birth: April 30, 1966 Age: 54 y.o. Gender: male  Primary Care Provider: Cyndi Bender, PA-C Consultants: Cardiology  Code Status: Full  Pt Overview and Major Events to Date:  Admitted 10/31  Assessment and Plan: Marco A Reeseis a 54 y.o.malesmokerpresenting withchest tightness and dyspnea likely due to CHF vs ACS. PMH is significant forESRD, CAD s/p PCI of LAD (06/2019), HTN, AS and chronic low back pain.  Chest tightness   ACS   Acute on chronic combined systolic and diastolic HF Denies chest pain and dyspnea. Initially patient presented with chest tightness and worsening dyspnea in the setting of bilaterally lower extremity edema for the last few weeks. History significant for CAD with prior LAD stent in 06/2019. Initially EKG demonstrated no ST elevations. CXR demonstrated enlarged cardiac silhouette and calcific atherosclerotic disease of the aorta, no focal airspace consolidation. Troponins trended 279>263>302. Echo demonstrated worsening EF 25-30% with severely decreased function of the LV and LV global hyperkinesis. Comparable to last echo a year ago that was notable for EF 35-40% with worsening cardiomyopathy. Cath performed on 11/1 notable for 80% distal left main stenosis with findings consistent with aortic valve stenosis. Cardiology recommended possible CABG possibly along with aortic valve replacement given cath results.  - cardiology following, appreciate continued involvement, follow cardiology recommendations  -cardiac surgery consulted by cardiology for CABG - heparin gtt per cardiology - continue ASA 81 mg - continue atorvastatin 80 mg - cardiology planning to restart carvedilol - cardiac monitoring - monitor daily weights - likely needs regular outpatient cardiology follow up   ESRD Last HD session on 11/1. HD MWF  schedule.Likely secondary to hypertensive renal disease. Patient initially denied inpatient dialysis because not able to tolerate as well in the setting of chronic back pain. Most recent labs notable for Cr 10.03, BUN 94 and GFR 6 compared to on admission Cr 9.09, BUN 81 and GFR 6. Avoid morphine for pain control due to renal function.  - nephrology following - continue Rena-Vit, lanthanum  HTN Fluctuating blood pressures with multiple hypertensive episodes likely secondary to chronic back pain. BP 120/94 this morning. BP 130/91 on admission. Home meds: amlodipine 10 mg, carvedilol 3.125 mg BID, clonidine 0.2 mg daily. Reported not taking amlodipine or carvedilol prior to admission. - continuing clonidine to avoid rebound HTN - cardiology plans to restart carvedilol  -continue to monitor   Chronic low back pain History of chronic back pain and degenerative disc disease. He states he has tried several medications and has been to pain clinics but the only thing that helps him is "horse Liniments". Home meds: cyclobenzaprine, naproxen prn.  - continue cyclobenzaprine - tylenol PRN for pain  - avoid morphine given ESRD status   GERD On pantoprazole 40 mg daily. - continue PPI  Tobacco use disorder Smokes about 1ppd. Started smoking when he was 54 yo. Discussed smoking cessation, patient states that he is ready to quit and would like to work toward this goal after discharge. - nicotine patch 21mg  - continue to encourage tobacco cessation    FEN/GI: renal diet  PPx: heparin    Disposition: inpatient, med telemetry         Subjective:  No acute events reported overnight. Patient states that he had the best sleep that he has ever had in the past 8 years. Denies chest pain and dyspnea. Denies any generalized or localized pain. Reports that  dialysis went well since he was able to sit in a chair which improved his back pain.   Objective: Temp:  [97.1 F (36.2 C)-98.6 F (37 C)]  97.5 F (36.4 C) (11/02 0519) Pulse Rate:  [80-124] 83 (11/02 0519) Resp:  [13-29] 20 (11/02 0519) BP: (96-170)/(73-118) 120/94 (11/02 0519) SpO2:  [90 %-100 %] 98 % (11/02 0519) Weight:  [99.6 kg-103.7 kg] 99.6 kg (11/02 0519) Physical Exam: General: Patient sitting upright in bed, in no acute distress. Cardiovascular: RRR, no murmurs or gallops auscultated, no evidence of JVD Respiratory: lungs clear to auscultation bilaterally, breathing comfortably on room air, good air movement throughout all lung fields  Abdomen: soft, nontender, presence of active bowel sounds Extremities: radial and dorsalis pedis pulses strong and equal bilaterally, 1+ pitting LE edema bilaterally improved since day prior Psych: pleasant, mood appropriate   Laboratory: Recent Labs  Lab 06/27/20 1234 06/27/20 1234 06/28/20 0437 06/28/20 1543 06/28/20 1547  WBC 9.3  --  9.7  --   --   HGB 13.8   < > 14.7 16.7   16.7 16.3  HCT 43.3   < > 45.5 49.0   49.0 48.0  PLT 202  --  236  --   --    < > = values in this interval not displayed.   Recent Labs  Lab 06/27/20 1234 06/27/20 1234 06/28/20 0437 06/28/20 1543 06/28/20 1547  NA 130*   < > 129* 136   136 135  K 5.0   < > 5.9* 4.0   4.0 3.9  CL 84*  --  86*  --   --   CO2 21*  --  16*  --   --   BUN 81*  --  94*  --   --   CREATININE 9.09*  --  10.03*  --   --   CALCIUM 9.1  --  9.2  --   --   GLUCOSE 101*  --  104*  --   --    < > = values in this interval not displayed.      Imaging/Diagnostic Tests: CARDIAC CATHETERIZATION  Result Date: 06/28/2020  Ost LM to Dist LM lesion is 80% stenosed.  Previously placed Ost LAD to Mid LAD stent (unknown type) is widely patent.  Hemodynamic findings consistent with aortic valve stenosis.  Marco Arnold is a 54 y.o. male  [id number] LOCATION:  FACILITY: [facility] PHYSICIAN: Marco Arnold, M.D. [birth year] DATE OF PROCEDURE:  06/28/2020 DATE OF DISCHARGE: CARDIAC CATHETERIZATION History obtained from chart  review.54 y.o. male with a hx of CAD with s/p LAD PCI 06/2019 at [facility], AS, ESRD, tobacco use who was admitted with chest pain and shortness of breath.  His troponins were mildly elevated in the 300 range but flat.  His EKG shows bundle branch block.  2D echo showed EF of 25 to 30% with severe aortic stenosis.  Resents now for right left heart cath to define his anatomy and physiology he did undergo hemodialysis today because of hyperkalemia PROCEDURE DESCRIPTION: The patient was brought to the second floor [facility] cath lab in the postabsorptive state. He was premedicated with IV Versed and fentanyl. His right groin was prepped and shaved in usual sterile fashion. Xylocaine 1% was used for local anesthesia. A 5 French sheath was inserted into the right common femoral artery using standard Seldinger technique.  A 7 French sheath was inserted into the right common femoral vein.  Both were inserted using ultrasound guided  access and digital images were captured and saved.  A 7 [place] balloontipped thermal dilution Swan-Ganz catheter was then advanced to the right heart chambers obtaining sequential pressures and blood samples for the determination of Fick and thermodilution cardiac output.  5 French right left Judkins diagnostic catheters along with a 5 [place] EBU guide catheter used for selective coronary angiography and obtaining left heart pressures.  Isovue dye was used for the entirety of the case.  Retrograde aortic, left ventricular and pullback pressures were recorded.  Right common femoral angiogram was performed and Mynx closure devices were successfully deployed in the right common femoral artery and vein.   Mr. Ansell has patent LAD stents although there does appear to be in-stent restenosis within in the proximal/ostial portion.  He has a left dominant system.  He does have an 80% eccentric distal left main stenosis seen in multiple views which appears hemodynamically significant.  I did  cross the aortic valve demonstrated no pullback gradient.  Based on his anatomy, he will require CABG plus or minus aortic valve replacement depending on the transesophageal echo performed at the time of bypass grafting.  Mynx closure devices were successfully deployed in both the right common femoral artery and vein.  The patient left lab in stable condition.  Dr. Gardiner Rhyme was notified of these results. Marco Arnold. MD, [facility] 06/28/2020 4:27 PM    Donney Dice, DO 06/29/2020, 6:17 AM PGY-1, [place] Intern pager: [phone], text pages welcome

## 2020-06-29 NOTE — Progress Notes (Addendum)
[place] for Heparin Indication: chest pain/ACS  Allergies  Allergen Reactions  . Lisinopril Shortness Of Breath    Unable to breath    Patient Measurements: Height: 6' 3.5" (191.8 cm) Weight: 99.6 kg (219 lb 9.6 oz) (scale c) IBW/kg (Calculated) : 85.65 Heparin Dosing Weight: 104kg  Vital Signs: Temp: 98.5 F (36.9 C) (11/02 0753) Temp Source: Oral (11/02 0753) BP: 135/92 (11/02 0753) Pulse Rate: 83 (11/02 0753)  Labs: Recent Labs    06/27/20 1234 06/27/20 1234 06/27/20 1424 06/27/20 1937 06/27/20 2123 06/28/20 0437 06/28/20 0437 06/28/20 1543 06/28/20 1543 06/28/20 1547 06/29/20 0653  HGB 13.8   < >  --   --   --  14.7   < > 16.7  16.7   < > 16.3 14.9  HCT 43.3   < >  --   --   --  45.5   < > 49.0  49.0  --  48.0 46.4  PLT 202  --   --   --   --  236  --   --   --   --  202  HEPARINUNFRC  --   --   --   --   --  0.27*  --   --   --   --  0.11*  CREATININE 9.09*  --   --   --   --  10.03*  --   --   --   --  7.49*  TROPONINIHS 257*   < > 279* 263* 302*  --   --   --   --   --   --    < > = values in this interval not displayed.    Estimated Creatinine Clearance: 13.7 mL/min (A) (by C-G formula based on SCr of 7.49 mg/dL (H)).   Medical History: Past Medical History:  Diagnosis Date  . Arthritis    spine  . Chronic kidney disease    [place] MWF  . Coronary artery disease   . Dialysis patient [facility]) 2013  . Diverticulitis   . Hyperlipidemia   . Hypertension    dr  Tobie Lords      @ [employer]  med  . Neuromuscular disorder ([place])    nerve pain - tx w/cymbalta  . Neuropathic pain 09/20/2017   Left mid-thoracic   Assessment: 54 yo M with ESRD and hx LAD stent 06/2019 with chest pain. No AC PTA. Pharmacy consulted for heparin.   Cath on 11/1 with 80% ost-distal LM lesion, in-stent restenosis, hemodynamic findings consistent with aortic valve stenosis, now pending CABG consult.  Per Dr. Alvester Chou, restarted heparin  infusion after cath without bolus at 11/1 at 2100.  Today, 8 hour heparin level is subtherapeutic at 0.11. No interruptions to infusion since restarted per patient and RN. Patient is ~18 hours from cath, no s/sx bleeding, Hgb and plt stable.  Given pending CABG consult and cath results, will give small bolus and increase rate to get to therapeutic level. Heparin level was closer to therapeutic after initial start with bolus.  Goal of Therapy:  Heparin level 0.3-0.7 units/ml Monitor platelets by anticoagulation protocol: Yes   Plan:  Heparin 2000 units bolus then increase to 1650 units/hr  F/u 8 hr HL Monitor daily HL, CBC/plt F/u CABG  Fara Olden, PharmD PGY-1 Pharmacy Resident 06/29/2020 8:51 AM Please see AMION for all pharmacy numbers

## 2020-06-29 NOTE — Progress Notes (Addendum)
[facility] Progress Note  Subjective: seen in room, no new c/o's. Had HD yest w/ 5 L off  Vitals:   06/29/20 0029 06/29/20 0519 06/29/20 0753 06/29/20 1200  BP: 107/83 (!) 120/94 (!) 135/92 (!) 142/94  Pulse: 88 83 83 87  Resp: 20 20 18 19   Temp: 97.9 F (36.6 C) (!) 97.5 F (36.4 C) 98.5 F (36.9 C) (!) 97.3 F (36.3 C)  TempSrc: Oral Oral Oral Oral  SpO2: 90% 98% 100% 100%  Weight:  99.6 kg    Height:        Exam:   alert, nad   no jvd  Chest cta bilat  Cor reg no RG  Abd soft ntnd no ascites obese   Ext 1-2+ ankle edema, dusky skin bilat forefeet   Alert, NF, ox3   LUE AVF+bruit     OP HD: [place]  MWF   4.5h  450/ 2.0  99.5kg  2/2 bath  P2  15g  Hep 3000  LUE AVF   - max UF is 7kg during week and 9kg on mondays   Hectorol 7 mcg IVP q HD   Sensipar 60mg  PO q HD   Lanthanum 4 tabs PO q meal  Assessment/ Plan: 1. NSTEMI: Troponin elevated but flat. Echo showed worsening CHF with EF 25-30%. LHC showed severe L main lesion, patent old LAD stent.  TCTS consulting. Plan pending.  2.  ESRD:  On MWF schedule. HD tomorrow 3.  HD access: Has recently been referred for fistulogram, will consider completing inpatient if issues with cannulation here. 4.  Hypertension/volume: BP elevated. New peripheral edema. Home BP meds= amlodipine, carvedilol, clonidine, and losartan. At dry wt today and lvedp was normal yesterday at 62mm Hg.  5.  Anemia: Hgb >12, no ESA indicated.  6.  Metabolic bone disease: Phos >30 on labs today. Outpatient phos usually around 13. Pt is not compliant with renal diet. Continue lanthanum and will recheck phos with AM labs.  7.  Nutrition:  Alb 3.3. Currently NPO     Marco Arnold 06/29/2020, 4:03 PM   Recent Labs  Lab 06/28/20 0437 06/28/20 1543 06/28/20 1547 06/29/20 0653  K 5.9*   < > 3.9 4.3  BUN 94*  --   --  63*  CREATININE 10.03*  --   --  7.49*  CALCIUM 9.2  --   --  8.8*  PHOS >30.0*  --   --  10.9*  HGB 14.7   < > 16.3  14.9   < > = values in this interval not displayed.   Inpatient medications: . aspirin  81 mg Oral Daily  . atorvastatin  80 mg Oral Daily  . carvedilol  3.125 mg Oral BID WC  . Chlorhexidine Gluconate Cloth  6 each Topical Q0600  . cloNIDine  0.2 mg Oral Daily  . lanthanum  4,000 mg Oral TID WC  . multivitamin  1 tablet Oral Q M,W,F  . nicotine  21 mg Transdermal Daily  . pantoprazole  40 mg Oral Daily  . sodium chloride flush  3 mL Intravenous Q12H  . sodium chloride flush  3 mL Intravenous Q12H   . sodium chloride    . heparin 1,650 Units/hr (06/29/20 1002)   sodium chloride, acetaminophen **OR** acetaminophen, acetaminophen, albuterol, cyclobenzaprine, diclofenac Sodium, HYDROcodone-acetaminophen, hydrOXYzine, lidocaine, ondansetron (ZOFRAN) IV, polyethylene glycol, sodium chloride flush

## 2020-06-29 NOTE — Progress Notes (Signed)
[facility] for Heparin Indication: chest pain/ACS  Allergies  Allergen Reactions  . Lisinopril Shortness Of Breath    Unable to breath    Patient Measurements: Height: 6' 3.5" (191.8 cm) Weight: 99.6 kg (219 lb 9.6 oz) (scale c) IBW/kg (Calculated) : 85.65 Heparin Dosing Weight: 104kg  Vital Signs: Temp: 97.3 F (36.3 C) (11/02 1200) Temp Source: Oral (11/02 1200) BP: 142/94 (11/02 1200) Pulse Rate: 87 (11/02 1200)  Labs: Recent Labs    06/27/20 1234 06/27/20 1234 06/27/20 1424 06/27/20 1937 06/27/20 2123 06/28/20 0437 06/28/20 0437 06/28/20 1543 06/28/20 1543 06/28/20 1547 06/29/20 0653 06/29/20 1804  HGB 13.8   < >  --   --   --  14.7   < > 16.7  16.7   < > 16.3 14.9  --   HCT 43.3   < >  --   --   --  45.5   < > 49.0  49.0  --  48.0 46.4  --   PLT 202  --   --   --   --  236  --   --   --   --  202  --   HEPARINUNFRC  --   --   --   --   --  0.27*  --   --   --   --  0.11* 0.25*  CREATININE 9.09*  --   --   --   --  10.03*  --   --   --   --  7.49*  --   TROPONINIHS 257*   < > 279* 263* 302*  --   --   --   --   --   --   --    < > = values in this interval not displayed.    Estimated Creatinine Clearance: 13.7 mL/min (A) (by C-G formula based on SCr of 7.49 mg/dL (H)).  Assessment: 54 yo M with ESRD and hx LAD stent 06/2019 with chest pain. No AC PTA. Pharmacy consulted for heparin.   Cath on 11/1 with 80% ost-distal LM lesion, in-stent restenosis, hemodynamic findings consistent with aortic valve stenosis, now pending CABG consult.  Hep lvl 0.25 - low  Goal of Therapy:  Heparin level 0.3-0.7 units/ml Monitor platelets by anticoagulation protocol: Yes   Plan:  Heparin 1000 units bolus then increase to 1800 units/hr  Daily hep lvl cbc  Barth Kirks, PharmD, BCPS, BCCCP Clinical Pharmacist [phone]  Please check AMION for all [place] numbers  06/29/2020 7:05 PM

## 2020-06-29 NOTE — Consult Note (Signed)
[address]       [place] [address]             [phone]        Marco Arnold [place] Medical Record #[id number] Date of Birth: [birth year]  Referring: Dr. Gwenlyn Found and Dr. Gardiner Rhyme, MD Primary Care: Cyndi Bender, PA-C Primary Cardiologist:No primary care provider on file.  Chief Complaint:    Chief Complaint  Patient presents with  . Chest heaviness/discomfort and shortness of breath  Reason for consultation: Coronary artery disease, severe aortic stenosis and tricuspid valve regurgitation, and moderate mitral regurgitation  History of Present Illness:     This is a 54 year old male with a past medical history of coronary artery disease (s/p PCI of LAD 11/20), tobacco abuse, ESRD (HD days M/W/Fri), hyperlipidemia, hypertension who presented to [facility] on 06/27/2020 with complaints of chest heaviness/discomfort and shortness of breath that awakened him from sleep. Associated with this was diaphoresis and . Of note, previous studies showed he also had moderate aortic stenosis by echo (mild by cath one year ago) and mild to moderate tricuspid regurgitation. EKG done in ED showed normal sinus rhythm, RBBB, previous ST changes that improved. Initial Troponin I (high sensitivity) was 257 and went up to 302. Echo done 06/27/2020 showed LVEF 25-30%, the left ventricle demonstrates global hypokinesis, moderate to severe aortic valve stenosis (aortic valve peak gradient measures 33.4 mmHg, aortic valve area, by VTI measures 0.73 cm), tricuspid valve regurgitation is severe, and moderate mitral valve regurgitation. Cardiac catheterization done ostial left main to distal left main lesion is 80% stenosed and previously placed Ost LAD to Mid LAD stent (unknown type) is widely patent. Dr. Orvan Seen has been consulted for consideration of coronary artery bypass grafting surgery, AVR, TVR +/- MVR. At the time of my exam, he had some shortness of breath (less so than upon admission)  and no chest pain/heaviness/discomfort.   Current Activity/ Functional Status: Patient is independent with mobility/ambulation, transfers, ADL's, IADL's.   Zubrod Score: At the time of surgery this patient's most appropriate activity status/level should be described as: []     0    Normal activity, no symptoms [x]     1    Restricted in physical strenuous activity but ambulatory, able to do out light work []     2    Ambulatory and capable of self care, unable to do work activities, up and about more than 50%  Of the time                            []     3    Only limited self care, in bed greater than 50% of waking hours []     4    Completely disabled, no self care, confined to bed or chair []     5    Moribund  Past Medical History:  Diagnosis Date  . Arthritis    spine  . Chronic kidney disease    [place] MWF  . Coronary artery disease   . Dialysis patient [facility]) 2013  . Diverticulitis   . Hyperlipidemia   . Hypertension    dr  Tobie Lords      @ [facility]  med  . Neuromuscular disorder ([place])    nerve pain - tx w/cymbalta  . Neuropathic pain 09/20/2017   Left mid-thoracic    Past Surgical History:  Procedure Laterality Date  .  AV FISTULA PLACEMENT  11/10/2011   Procedure: ARTERIOVENOUS (AV) FISTULA CREATION;  Surgeon: Mal Misty, MD;  Location: [place];  Service: Vascular;  Laterality: Left;  Ultrasound guided  . CARDIAC CATHETERIZATION  09/19/2018   On stent study at [facility]  . COLONOSCOPY    . INSERTION OF DIALYSIS CATHETER  02/09/2012   Procedure: INSERTION OF DIALYSIS CATHETER;  Surgeon: Mal Misty, MD;  Location: [place];  Service: Vascular;  Laterality: N/A;  . MASS EXCISION Right 09/07/2018   Procedure: EXCISION MASS RIGHT THUMB;  Surgeon: Charlotte Crumb, MD;  Location: [place];  Service: Orthopedics;  Laterality: Right;  OR AXILLARY BLOCK  . REVISON OF ARTERIOVENOUS FISTULA Left 04/08/2019   Procedure: REVISION PLICATION OF LEFT ARM ARTERIOVENOUS FISTULA;  Surgeon:  Serafina Mitchell, MD;  Location: [place];  Service: Vascular;  Laterality: Left;  . RIGHT/LEFT HEART CATH AND CORONARY ANGIOGRAPHY N/A 06/28/2020   Procedure: RIGHT/LEFT HEART CATH AND CORONARY ANGIOGRAPHY;  Surgeon: Lorretta Harp, MD;  Location: [place] CV LAB;  Service: Cardiovascular;  Laterality: N/A;  . SHUNTOGRAM Left 06/03/2012   Procedure: Earney Mallet;  Surgeon: Conrad [place], MD;  Location: [facility] CATH LAB;  Service: Cardiovascular;  Laterality: Left;  . SHUNTOGRAM Left 01/02/2013   Procedure: SHUNTOGRAM;  Surgeon: Conrad Wallula, MD;  Location: [facility] CATH LAB;  Service: Cardiovascular;  Laterality: Left;    Social History   Tobacco Use  Smoking Status Light Tobacco Smoker  . Packs/day: 0.25  . Years: 43.00  . Pack years: 10.75  . Types: Cigarettes  Smokeless Tobacco Never Used  Tobacco Comment   pt states that he is trying to quit--is trying to cut back at the current time    Social History   Substance and Sexual Activity  Alcohol Use Yes   Comment: occasional     Allergies  Allergen Reactions  . Lisinopril Shortness Of Breath    Unable to breath    Current Facility-Administered Medications  Medication Dose Route Frequency Provider Last Rate Last Admin  . 0.9 %  sodium chloride infusion  250 mL Intravenous PRN Lorretta Harp, MD      . acetaminophen (TYLENOL) tablet 650 mg  650 mg Oral Q6H PRN Lorretta Harp, MD   650 mg at 06/27/20 2206   Or  . acetaminophen (TYLENOL) suppository 650 mg  650 mg Rectal Q6H PRN Lorretta Harp, MD      . acetaminophen (TYLENOL) tablet 650 mg  650 mg Oral Q4H PRN Lorretta Harp, MD      . albuterol (VENTOLIN HFA) 108 (90 Base) MCG/ACT inhaler 2 puff  2 puff Inhalation Q6H PRN Lorretta Harp, MD      . aspirin chewable tablet 81 mg  81 mg Oral Daily Lorretta Harp, MD   81 mg at 06/29/20 1004  . atorvastatin (LIPITOR) tablet 80 mg  80 mg Oral Daily Lorretta Harp, MD   80 mg at 06/29/20 1004  . carvedilol (COREG) tablet  3.125 mg  3.125 mg Oral BID WC Donato Heinz, MD   3.125 mg at 06/29/20 1007  . Chlorhexidine Gluconate Cloth 2 % PADS 6 each  6 each Topical Q0600 Lorretta Harp, MD   6 each at 06/28/20 1756  . cloNIDine (CATAPRES) tablet 0.2 mg  0.2 mg Oral Daily Lorretta Harp, MD   0.2 mg at 06/29/20 1004  . cyclobenzaprine (FLEXERIL) tablet 20 mg  20 mg Oral BID PRN Quay Burow  J, MD      . diclofenac Sodium (VOLTAREN) 1 % topical gel 4 g  4 g Topical QID PRN Lorretta Harp, MD   4 g at 06/28/20 0554  . heparin ADULT infusion 100 units/mL (25000 units/224mL sodium chloride 0.45%)  1,650 Units/hr Intravenous Continuous Lenoria Chime, MD 16.5 mL/hr at 06/29/20 1002 1,650 Units/hr at 06/29/20 1002  . HYDROcodone-acetaminophen (NORCO/VICODIN) 5-325 MG per tablet 1-2 tablet  1-2 tablet Oral Q4H PRN Lorretta Harp, MD   2 tablet at 06/29/20 1004  . hydrOXYzine (ATARAX/VISTARIL) tablet 25 mg  25 mg Oral BID PRN Lorretta Harp, MD   25 mg at 06/28/20 2158  . lanthanum (FOSRENOL) chewable tablet 4,000 mg  4,000 mg Oral TID WC Lorretta Harp, MD   4,000 mg at 06/29/20 1215  . lidocaine (LIDODERM) 5 % 1 patch  1 patch Transdermal Daily PRN Lorretta Harp, MD      . multivitamin (RENA-VIT) tablet 1 tablet  1 tablet Oral Q M,W,F Lorretta Harp, MD   1 tablet at 06/29/20 1003  . nicotine (NICODERM CQ - dosed in mg/24 hours) patch 21 mg  21 mg Transdermal Daily Lorretta Harp, MD      . ondansetron [facility]) injection 4 mg  4 mg Intravenous Q6H PRN Lorretta Harp, MD      . pantoprazole (PROTONIX) EC tablet 40 mg  40 mg Oral Daily Lorretta Harp, MD   40 mg at 06/29/20 1004  . polyethylene glycol (MIRALAX / GLYCOLAX) packet 17 g  17 g Oral Daily PRN Lorretta Harp, MD      . sodium chloride flush (NS) 0.9 % injection 3 mL  3 mL Intravenous Q12H Lorretta Harp, MD   3 mL at 06/28/20 2158  . sodium chloride flush (NS) 0.9 % injection 3 mL  3 mL Intravenous Q12H Lorretta Harp, MD   3 mL at 06/29/20 1000  . sodium chloride flush (NS) 0.9 % injection 3 mL  3 mL Intravenous PRN Lorretta Harp, MD        Facility-Administered Medications Prior to Admission  Medication Dose Route Frequency Provider Last Rate Last Admin  . chlorhexidine (HIBICLENS) 4 % liquid   Topical Daily Manandhar, Collene Mares, MD      . terbinafine (LAMISIL) 1 % cream   Topical Daily Rosiland Oz, MD       Medications Prior to Admission  Medication Sig Dispense Refill Last Dose  . acetaminophen (TYLENOL 8 HOUR) 650 MG CR tablet Take 1 tablet (650 mg total) by mouth every 8 (eight) hours as needed for pain. 15 tablet 0 Past Week at Unknown time  . aspirin EC 81 MG tablet Take 81 mg by mouth daily.   06/27/2020 at Unknown time  . cloNIDine (CATAPRES) 0.1 MG tablet Take 0.2 mg by mouth daily.   06/27/2020 at Unknown time  . cyclobenzaprine (FLEXERIL) 10 MG tablet Take 20 mg by mouth 2 (two) times daily as needed for muscle spasms.    06/27/2020 at Unknown time  . FOSRENOL 1000 MG chewable tablet Chew 6,000 mg by mouth 3 (three) times daily with meals.   11 06/27/2020 at Unknown time  . hydrOXYzine (ATARAX/VISTARIL) 25 MG tablet Take 25 mg by mouth 2 (two) times daily as needed for anxiety or itching (Sleep).    06/27/2020 at Unknown time  . lidocaine (LIDODERM) 5 % Place 1 patch onto the skin daily as needed. Apply patch  to area most significant pain once per day.  Remove and discard patch within 12 hours of application. (Patient taking differently: Place 1 patch onto the skin daily as needed (Apply patch to area most significant pain once per day.  Remove and discard patch within 12 hours of application.). ) 30 patch 0 unk at prn  . multivitamin (RENA-VIT) TABS tablet Take 1 tablet by mouth See admin instructions. Every Monday, Wednesday and Friday   06/27/2020 at Unknown time  . naproxen sodium (ALEVE) 220 MG tablet Take 220 mg by mouth 2 (two) times daily as needed (pain).    06/27/2020 at  Unknown time  . pantoprazole (PROTONIX) 40 MG tablet Take 40 mg by mouth daily.    06/27/2020 at Unknown time  . VENTOLIN HFA 108 (90 Base) MCG/ACT inhaler Inhale 2 puffs into the lungs every 6 (six) hours as needed for wheezing or shortness of breath.    unk at prn  . amLODipine (NORVASC) 10 MG tablet Take 10 mg by mouth at bedtime.  (Patient not taking: Reported on 06/15/2020)     . atorvastatin (LIPITOR) 80 MG tablet Take 80 mg by mouth daily.  (Patient not taking: Reported on 06/15/2020)     . carvedilol (COREG) 3.125 MG tablet Take 3.125 mg by mouth 2 (two) times daily with a meal.  (Patient not taking: Reported on 06/15/2020)     . DULoxetine (CYMBALTA) 60 MG capsule Take 60 mg by mouth every morning.  (Patient not taking: Reported on 06/15/2020)  2   . gabapentin (NEURONTIN) 100 MG capsule Take 3 capsules (300 mg total) by mouth at bedtime. (Patient not taking: Reported on 06/15/2020) 60 capsule 0   . HYDROcodone-acetaminophen (NORCO) 5-325 MG tablet Take 1 tablet by mouth every 6 (six) hours as needed for moderate pain. (Patient not taking: Reported on 06/15/2020) 15 tablet 0   . nortriptyline (PAMELOR) 25 MG capsule Take 25 mg by mouth every morning.  (Patient not taking: Reported on 06/15/2020)  2     Family History  Problem Relation Age of Onset  . Pancreatic cancer Mother   . Diabetes Father   . Hypertension Father   . Cancer - Lung Paternal Grandfather   Patient is on disability. He is separated from his wife. He has 2 children-99 year old daughter and 18 year old son   Review of Systems:     Cardiac Review of Systems: Y or  [  N  ]= no  Chest Pain [  N  ] Pedal Edema [ Y  ]   Palpitations Aqua.Slicker  ] Syncope  [ N ]   Presyncope [  N ]  General Review of Systems: [Y] = yes [ N ]=no Constitional:  fatigue [ Y ]; nausea [  N]; night sweats [ N ]; fever [ N ]; or chills [ N ]                                                               Dental: Last Dentist visit: At least 3 years ago.  He has multiple missing teeth  Eye :  Amaurosis fugax[Y ]; Resp: cough [ N ];  wheezing[N  ];  hemoptysis[N  ];  GI:   vomiting[ N ];  melena[N  ];  hematochezia [ N ];  GU:  hematuria[ N ];                Heme/Lymph:  anemia[N  ];  Neuro: TIA[ N ];  stroke[N  ];   seizures[N  ];     Endocrine: diabetes[No prior history-check HGA1C ];  thyroid dysfunction[N  ];                  Physical Exam: BP (!) 142/94 (BP Location: Right Arm)   Pulse 87   Temp (!) 97.3 F (36.3 C) (Oral)   Resp 19   Ht 6' 3.5" (1.918 m)   Wt 99.6 kg Comment: scale c  SpO2 100%   BMI 27.09 kg/m    General appearance: alert, cooperative and no distress Head: Normocephalic, without obvious abnormality, atraumatic Neck: no carotid bruit, no JVD and supple, symmetrical, trachea midline Resp: clear to auscultation bilaterally Cardio: RRR, murmur grade III/VI best heard along left sternal border GI: Soft, non tender, bowel sounds present Extremities: ++ LE ankle/edema Neurologic: Grossly normal  Diagnostic Studies & Laboratory data:     Recent Radiology Findings:   CARDIAC CATHETERIZATION  Result Date: 06/28/2020  Ost LM to Dist LM lesion is 80% stenosed.  Previously placed Ost LAD to Mid LAD stent (unknown type) is widely patent.  Hemodynamic findings consistent with aortic valve stenosis.  REILY ILIC is a 54 y.o. male  [id number] LOCATION:  FACILITY: [place] PHYSICIAN: Quay Burow, M.D. Dec 24, 1965 DATE OF PROCEDURE:  06/28/2020 DATE OF DISCHARGE: CARDIAC CATHETERIZATION History obtained from chart review.54 y.o. male with a hx of CAD with s/p LAD PCI 06/2019 at [facility], AS, ESRD, tobacco use who was admitted with chest pain and shortness of breath.  His troponins were mildly elevated in the 300 range but flat.  His EKG shows bundle branch block.  2D echo showed EF of 25 to 30% with severe aortic stenosis.  Resents now for right left heart cath to define his anatomy and physiology he did undergo hemodialysis  today because of hyperkalemia PROCEDURE DESCRIPTION: The patient was brought to the second floor [facility] cath lab in the postabsorptive state. He was premedicated with IV Versed and fentanyl. His right groin was prepped and shaved in usual sterile fashion. Xylocaine 1% was used for local anesthesia. A 5 French sheath was inserted into the right common femoral artery using standard Seldinger technique.  A 7 French sheath was inserted into the right common femoral vein.  Both were inserted using ultrasound guided access and digital images were captured and saved.  A 7 [place] balloontipped thermal dilution Swan-Ganz catheter was then advanced to the right heart chambers obtaining sequential pressures and blood samples for the determination of Fick and thermodilution cardiac output.  5 French right left Judkins diagnostic catheters along with a 5 [place] EBU guide catheter used for selective coronary angiography and obtaining left heart pressures.  Isovue dye was used for the entirety of the case.  Retrograde aortic, left ventricular and pullback pressures were recorded.  Right common femoral angiogram was performed and Mynx closure devices were successfully deployed in the right common femoral artery and vein.   Mr. Bruna has patent LAD stents although there does appear to be in-stent restenosis within in the proximal/ostial portion.  He has a left dominant system.  He does have an 80% eccentric distal left main stenosis seen in multiple views which appears hemodynamically significant.  I did cross the aortic valve demonstrated no pullback  gradient.  Based on his anatomy, he will require CABG plus or minus aortic valve replacement depending on the transesophageal echo performed at the time of bypass grafting.  Mynx closure devices were successfully deployed in both the right common femoral artery and vein.  The patient left lab in stable condition.  Dr. Gardiner Rhyme was notified of these results. Quay Burow. MD, [facility] 06/28/2020 4:27 PM  Diagnostic Dominance: Left       ECHOCARDIOGRAM COMPLETE  Result Date: 06/27/2020    ECHOCARDIOGRAM REPORT   Patient Name:   Desmon A Mowbray Date of Exam: 06/27/2020 Medical Rec #:  [id number]     Height:       75.5 in Accession #:    [id number]    Weight:       230.0 lb Date of Birth:  1966/05/01     BSA:          2.340 m Patient Age:    39 years      BP:           153/97 mmHg Patient Gender: M             HR:           84 bpm. Exam Location:  Inpatient Procedure: 2D Echo, Cardiac Doppler and Color Doppler Indications:    R94.31 Abnormal EKG  History:        Patient has no prior history of Echocardiogram examinations.                 CAD; Risk Factors:Hypertension.  Sonographer:    Merrie Roof RDCS Referring Phys: [id number] [place]  1. Left ventricular ejection fraction, by estimation, is 25 to 30%. The left ventricle has severely decreased function. The left ventricle demonstrates global hypokinesis. The left ventricular internal cavity size was mildly dilated. Left ventricular diastolic parameters are indeterminate.  2. Right ventricular systolic function is moderately reduced. The right ventricular size is moderately enlarged. There is mildly elevated pulmonary artery systolic pressure. The estimated right ventricular systolic pressure is 94.1 mmHg.  3. Left atrial size was moderately dilated.  4. Right atrial size was moderately dilated.  5. The mitral valve is degenerative. Moderate mitral valve regurgitation. No evidence of mitral stenosis.  6. Tricuspid valve regurgitation is severe.  7. Low flow, low gradient aortic stenosis. DI is 0.20. The aortic valve is tricuspid. There is severe calcifcation of the aortic valve. There is severe thickening of the aortic valve. Aortic valve regurgitation is not visualized. Moderate to severe aortic valve stenosis. Aortic valve area, by VTI measures 0.73 cm. Aortic valve mean gradient measures 15.0 mmHg.  Aortic valve Vmax measures 2.89 m/s.  8. The inferior vena cava is dilated in size with >50% respiratory variability, suggesting right atrial pressure of 8 mmHg. FINDINGS  Left Ventricle: Left ventricular ejection fraction, by estimation, is 25 to 30%. The left ventricle has severely decreased function. The left ventricle demonstrates global hypokinesis. The left ventricular internal cavity size was mildly dilated. There is no left ventricular hypertrophy. Left ventricular diastolic parameters are indeterminate.  LV Wall Scoring: The anterior septum, inferior wall, mid inferolateral segment, mid inferoseptal segment, and basal inferoseptal segment are akinetic. Right Ventricle: The right ventricular size is moderately enlarged. No increase in right ventricular wall thickness. Right ventricular systolic function is moderately reduced. There is mildly elevated pulmonary artery systolic pressure. The tricuspid regurgitant velocity is 2.53 m/s, and with an assumed right atrial pressure of 8 mmHg, the estimated right  ventricular systolic pressure is 46.2 mmHg. Left Atrium: Left atrial size was moderately dilated. Right Atrium: Right atrial size was moderately dilated. Pericardium: There is no evidence of pericardial effusion. Mitral Valve: The mitral valve is degenerative in appearance. Moderate mitral valve regurgitation. No evidence of mitral valve stenosis. Tricuspid Valve: The tricuspid valve is normal in structure. Tricuspid valve regurgitation is severe. No evidence of tricuspid stenosis. Aortic Valve: Low flow, low gradient aortic stenosis. DI is 0.20. The aortic valve is tricuspid. There is severe calcifcation of the aortic valve. There is severe thickening of the aortic valve. Aortic valve regurgitation is not visualized. Moderate to severe aortic stenosis is present. Aortic valve mean gradient measures 15.0 mmHg. Aortic valve peak gradient measures 33.4 mmHg. Aortic valve area, by VTI measures 0.73 cm.  Pulmonic Valve: The pulmonic valve was normal in structure. Pulmonic valve regurgitation is not visualized. No evidence of pulmonic stenosis. Aorta: The aortic root is normal in size and structure. Venous: The inferior vena cava is dilated in size with greater than 50% respiratory variability, suggesting right atrial pressure of 8 mmHg. IAS/Shunts: No atrial level shunt detected by color flow Doppler.  LEFT VENTRICLE PLAX 2D LVIDd:         6.80 cm LVIDs:         6.10 cm LV PW:         1.20 cm LV IVS:        1.10 cm LVOT diam:     2.10 cm LV SV:         32 LV SV Index:   14 LVOT Area:     3.46 cm  RIGHT VENTRICLE RV Basal diam:  5.80 cm RV Mid diam:    5.50 cm RV S prime:     8.41 cm/s TAPSE (M-mode): 2.3 cm LEFT ATRIUM              Index       RIGHT ATRIUM           Index LA diam:        5.10 cm  2.18 cm/m  RA Area:     28.20 cm LA Vol (A2C):   108.0 ml 46.15 ml/m RA Volume:   108.00 ml 46.15 ml/m LA Vol (A4C):   114.0 ml 48.71 ml/m LA Biplane Vol: 115.0 ml 49.14 ml/m  AORTIC VALVE AV Area (Vmax):    0.74 cm AV Area (Vmean):   0.67 cm AV Area (VTI):     0.73 cm AV Vmax:           289.00 cm/s AV Vmean:          180.000 cm/s AV VTI:            0.442 m AV Peak Grad:      33.4 mmHg AV Mean Grad:      15.0 mmHg LVOT Vmax:         62.00 cm/s LVOT Vmean:        34.700 cm/s LVOT VTI:          0.093 m LVOT/AV VTI ratio: 0.21  AORTA Ao Root diam: 3.50 cm MR Peak grad: 114.1 mmHg  TRICUSPID VALVE MR Mean grad: 61.0 mmHg   TR Peak grad:   25.6 mmHg MR Vmax:      534.00 cm/s TR Vmax:        253.00 cm/s MR Vmean:     365.0 cm/s  SHUNTS                           Systemic VTI:  0.09 m                           Systemic Diam: 2.10 cm Candee Furbish MD Electronically signed by Candee Furbish MD Signature Date/Time: 06/27/2020/4:48:49 PM    Final      I have independently reviewed the above radiologic studies and discussed with the patient   Recent Lab Findings: Lab Results  Component Value Date    WBC 9.9 06/29/2020   HGB 14.9 06/29/2020   HCT 46.4 06/29/2020   PLT 202 06/29/2020   GLUCOSE 139 (H) 06/29/2020   CHOL 136 11/06/2011   TRIG 71 11/06/2011   HDL 38 (L) 11/06/2011   LDLCALC 84 11/06/2011   ALT 27 03/23/2020   AST 31 03/23/2020   NA 133 (L) 06/29/2020   K 4.3 06/29/2020   CL 90 (L) 06/29/2020   CREATININE 7.49 (H) 06/29/2020   BUN 63 (H) 06/29/2020   CO2 21 (L) 06/29/2020   TSH 0.019 (L) 11/06/2011   INR 1.2 04/26/2019   HGBA1C 5.2 11/06/2011   Assessment / Plan:   1. S/p NSTEMI, coronary artery disease with cardiomyopathy (estimated 25-30% by echo but may be worse). Because of depressed LVEF, would consider viability study. Also, on chest x ray,calcific atherosclerotic disease of the aorta. Patient may need a CT of the chest to further evaluate extent of aortic calcification. Dr. Orvan Seen to evaluate and provide treatment recommendations. 2. Severe aortic stenosis 3. Severe tricuspid valve regurgitation 4. Moderate mitral valve regurgitation 5. History of hypertension 6. History of hyperlipidemia 7. History of ESRD-HD days are Mon/Wed/Fri 8. History of neuromuscular disorder  I  spent 20 minutes counseling the patient face to face.   Lars Pinks PA-C 06/29/2020 1:10 PM

## 2020-06-29 NOTE — Progress Notes (Signed)
Progress Note  Patient Name: Marco Arnold Date of Encounter: 06/29/2020  [place] Cardiologist: No primary care provider on file.   Subjective   Denies any chest pain  Inpatient Medications    Scheduled Meds: . aspirin  81 mg Oral Daily  . atorvastatin  80 mg Oral Daily  . Chlorhexidine Gluconate Cloth  6 each Topical Q0600  . cloNIDine  0.2 mg Oral Daily  . heparin  2,000 Units Intravenous Once  . lanthanum  4,000 mg Oral TID WC  . multivitamin  1 tablet Oral Q M,W,F  . nicotine  21 mg Transdermal Daily  . pantoprazole  40 mg Oral Daily  . sodium chloride flush  3 mL Intravenous Q12H  . sodium chloride flush  3 mL Intravenous Q12H   Continuous Infusions: . sodium chloride    . heparin 1,550 Units/hr (06/28/20 2201)   PRN Meds: sodium chloride, acetaminophen **OR** acetaminophen, acetaminophen, albuterol, cyclobenzaprine, diclofenac Sodium, HYDROcodone-acetaminophen, hydrOXYzine, lidocaine, ondansetron (ZOFRAN) IV, polyethylene glycol, sodium chloride flush   Vital Signs    Vitals:   06/28/20 2028 06/29/20 0029 06/29/20 0519 06/29/20 0753  BP: (!) 142/100 107/83 (!) 120/94 (!) 135/92  Pulse: 88 88 83 83  Resp: 20 20 20 18   Temp: 98.6 F (37 C) 97.9 F (36.6 C) (!) 97.5 F (36.4 C) 98.5 F (36.9 C)  TempSrc:  Oral Oral Oral  SpO2: 98% 90% 98% 100%  Weight:   99.6 kg   Height:        Intake/Output Summary (Last 24 hours) at 06/29/2020 0902 Last data filed at 06/28/2020 1500 Gross per 24 hour  Intake 333.34 ml  Output 5000 ml  Net -4666.66 ml   Last 3 Weights 06/29/2020 06/28/2020 06/28/2020  Weight (lbs) 219 lb 9.6 oz 228 lb 9.9 oz 227 lb 1.6 oz  Weight (kg) 99.61 kg 103.7 kg 103.012 kg      Telemetry    SR in 80s - Personally Reviewed  ECG    No new ECG- Personally Reviewed  Physical Exam   GEN: No acute distress.   Neck: No JVD Cardiac: RRR, 3/6 systolic murmur loudest at RUSB Respiratory: Clear to auscultation bilaterally. GI: Soft,  nontender, non-distended  MS: 1+ edema Neuro:  Nonfocal  Psych: Normal affect   Labs    High Sensitivity Troponin:   Recent Labs  Lab 06/27/20 1234 06/27/20 1424 06/27/20 1937 06/27/20 2123  TROPONINIHS 257* 279* 263* 302*      Chemistry Recent Labs  Lab 06/27/20 1234 06/27/20 1234 06/28/20 0437 06/28/20 0437 06/28/20 1543 06/28/20 1547 06/29/20 0653  NA 130*   < > 129*   < > 136  136 135 133*  K 5.0   < > 5.9*   < > 4.0  4.0 3.9 4.3  CL 84*  --  86*  --   --   --  90*  CO2 21*  --  16*  --   --   --  21*  GLUCOSE 101*  --  104*  --   --   --  139*  BUN 81*  --  94*  --   --   --  63*  CREATININE 9.09*  --  10.03*  --   --   --  7.49*  CALCIUM 9.1  --  9.2  --   --   --  8.8*  ALBUMIN  --   --  3.3*  --   --   --  3.2*  GFRNONAA 6*  --  6*  --   --   --  8*  ANIONGAP 25*  --  27*  --   --   --  22*   < > = values in this interval not displayed.     Hematology Recent Labs  Lab 06/27/20 1234 06/27/20 1234 06/28/20 0437 06/28/20 0437 06/28/20 1543 06/28/20 1547 06/29/20 0653  WBC 9.3  --  9.7  --   --   --  9.9  RBC 5.01  --  5.34  --   --   --  5.34  HGB 13.8   < > 14.7   < > 16.7  16.7 16.3 14.9  HCT 43.3   < > 45.5   < > 49.0  49.0 48.0 46.4  MCV 86.4  --  85.2  --   --   --  86.9  MCH 27.5  --  27.5  --   --   --  27.9  MCHC 31.9  --  32.3  --   --   --  32.1  RDW 16.1*  --  16.3*  --   --   --  16.7*  PLT 202  --  236  --   --   --  202   < > = values in this interval not displayed.    BNPNo results for input(s): BNP, PROBNP in the last 168 hours.   DDimer No results for input(s): DDIMER in the last 168 hours.   Radiology    DG Chest 2 View  Result Date: 06/27/2020 CLINICAL DATA:  Chest pain and shortness of breath. EXAM: CHEST - 2 VIEW COMPARISON:  March 23, 2020 FINDINGS: Enlarged cardiac silhouette. Mediastinal contours appear intact. Calcific atherosclerotic disease of the aorta. There is no evidence of focal airspace consolidation,  pleural effusion or pneumothorax. Osseous structures are without acute abnormality. Soft tissues are grossly normal. IMPRESSION: 1. Enlarged cardiac silhouette. 2. Calcific atherosclerotic disease of the aorta. 3. No focal airspace consolidation. Electronically Signed   By: Fidela Salisbury M.D.   On: 06/27/2020 12:45   CARDIAC CATHETERIZATION  Result Date: 06/28/2020  Ost LM to Dist LM lesion is 80% stenosed.  Previously placed Ost LAD to Mid LAD stent (unknown type) is widely patent.  Hemodynamic findings consistent with aortic valve stenosis.  Marco Arnold is a 54 y.o. male  [id number] LOCATION:  FACILITY: [facility] PHYSICIAN: Quay Burow, M.D. [birth year] DATE OF PROCEDURE:  06/28/2020 DATE OF DISCHARGE: CARDIAC CATHETERIZATION History obtained from chart review.54 y.o. male with a hx of CAD with s/p LAD PCI 06/2019 at [facility], AS, ESRD, tobacco use who was admitted with chest pain and shortness of breath.  His troponins were mildly elevated in the 300 range but flat.  His EKG shows bundle branch block.  2D echo showed EF of 25 to 30% with severe aortic stenosis.  Resents now for right left heart cath to define his anatomy and physiology he did undergo hemodialysis today because of hyperkalemia PROCEDURE DESCRIPTION: The patient was brought to the second floor [facility]iac cath lab in the postabsorptive state. He was premedicated with IV Versed and fentanyl. His right groin was prepped and shaved in usual sterile fashion. Xylocaine 1% was used for local anesthesia. A 5 French sheath was inserted into the right common femoral artery using standard Seldinger technique.  A 7 French sheath was inserted into the right common femoral vein.  Both were inserted using ultrasound guided access and  digital images were captured and saved.  A 7 [place] balloontipped thermal dilution Swan-Ganz catheter was then advanced to the right heart chambers obtaining sequential pressures and blood samples for the  determination of Fick and thermodilution cardiac output.  5 French right left Judkins diagnostic catheters along with a 5 [place] EBU guide catheter used for selective coronary angiography and obtaining left heart pressures.  Isovue dye was used for the entirety of the case.  Retrograde aortic, left ventricular and pullback pressures were recorded.  Right common femoral angiogram was performed and Mynx closure devices were successfully deployed in the right common femoral artery and vein.   Mr. Kaczmarczyk has patent LAD stents although there does appear to be in-stent restenosis within in the proximal/ostial portion.  He has a left dominant system.  He does have an 80% eccentric distal left main stenosis seen in multiple views which appears hemodynamically significant.  I did cross the aortic valve demonstrated no pullback gradient.  Based on his anatomy, he will require CABG plus or minus aortic valve replacement depending on the transesophageal echo performed at the time of bypass grafting.  Mynx closure devices were successfully deployed in both the right common femoral artery and vein.  The patient left lab in stable condition.  Dr. Gardiner Rhyme was notified of these results. Quay Burow. MD, [facility] 06/28/2020 4:27 PM   ECHOCARDIOGRAM COMPLETE  Result Date: 06/27/2020    ECHOCARDIOGRAM REPORT   Patient Name:   Montrey A Touhey Date of Exam: 06/27/2020 Medical Rec #:  [id number]     Height:       75.5 in Accession #:    [id number]    Weight:       230.0 lb Date of Birth:  [birth year]     BSA:          2.340 m Patient Age:    89 years      BP:           153/97 mmHg Patient Gender: M             HR:           84 bpm. Exam Location:  Inpatient Procedure: 2D Echo, Cardiac Doppler and Color Doppler Indications:    R94.31 Abnormal EKG  History:        Patient has no prior history of Echocardiogram examinations.                 CAD; Risk Factors:Hypertension.  Sonographer:    Merrie Roof RDCS Referring Phys: [id number] [place]  1. Left ventricular ejection fraction, by estimation, is 25 to 30%. The left ventricle has severely decreased function. The left ventricle demonstrates global hypokinesis. The left ventricular internal cavity size was mildly dilated. Left ventricular diastolic parameters are indeterminate.  2. Right ventricular systolic function is moderately reduced. The right ventricular size is moderately enlarged. There is mildly elevated pulmonary artery systolic pressure. The estimated right ventricular systolic pressure is 41.7 mmHg.  3. Left atrial size was moderately dilated.  4. Right atrial size was moderately dilated.  5. The mitral valve is degenerative. Moderate mitral valve regurgitation. No evidence of mitral stenosis.  6. Tricuspid valve regurgitation is severe.  7. Low flow, low gradient aortic stenosis. DI is 0.20. The aortic valve is tricuspid. There is severe calcifcation of the aortic valve. There is severe thickening of the aortic valve. Aortic valve regurgitation is not visualized. Moderate to severe aortic valve stenosis. Aortic valve area, by VTI measures  0.73 cm. Aortic valve mean gradient measures 15.0 mmHg. Aortic valve Vmax measures 2.89 m/s.  8. The inferior vena cava is dilated in size with >50% respiratory variability, suggesting right atrial pressure of 8 mmHg. FINDINGS  Left Ventricle: Left ventricular ejection fraction, by estimation, is 25 to 30%. The left ventricle has severely decreased function. The left ventricle demonstrates global hypokinesis. The left ventricular internal cavity size was mildly dilated. There is no left ventricular hypertrophy. Left ventricular diastolic parameters are indeterminate.  LV Wall Scoring: The anterior septum, inferior wall, mid inferolateral segment, mid inferoseptal segment, and basal inferoseptal segment are akinetic. Right Ventricle: The right ventricular size is moderately enlarged. No increase in right ventricular wall thickness.  Right ventricular systolic function is moderately reduced. There is mildly elevated pulmonary artery systolic pressure. The tricuspid regurgitant velocity is 2.53 m/s, and with an assumed right atrial pressure of 8 mmHg, the estimated right ventricular systolic pressure is 31.4 mmHg. Left Atrium: Left atrial size was moderately dilated. Right Atrium: Right atrial size was moderately dilated. Pericardium: There is no evidence of pericardial effusion. Mitral Valve: The mitral valve is degenerative in appearance. Moderate mitral valve regurgitation. No evidence of mitral valve stenosis. Tricuspid Valve: The tricuspid valve is normal in structure. Tricuspid valve regurgitation is severe. No evidence of tricuspid stenosis. Aortic Valve: Low flow, low gradient aortic stenosis. DI is 0.20. The aortic valve is tricuspid. There is severe calcifcation of the aortic valve. There is severe thickening of the aortic valve. Aortic valve regurgitation is not visualized. Moderate to severe aortic stenosis is present. Aortic valve mean gradient measures 15.0 mmHg. Aortic valve peak gradient measures 33.4 mmHg. Aortic valve area, by VTI measures 0.73 cm. Pulmonic Valve: The pulmonic valve was normal in structure. Pulmonic valve regurgitation is not visualized. No evidence of pulmonic stenosis. Aorta: The aortic root is normal in size and structure. Venous: The inferior vena cava is dilated in size with greater than 50% respiratory variability, suggesting right atrial pressure of 8 mmHg. IAS/Shunts: No atrial level shunt detected by color flow Doppler.  LEFT VENTRICLE PLAX 2D LVIDd:         6.80 cm LVIDs:         6.10 cm LV PW:         1.20 cm LV IVS:        1.10 cm LVOT diam:     2.10 cm LV SV:         32 LV SV Index:   14 LVOT Area:     3.46 cm  RIGHT VENTRICLE RV Basal diam:  5.80 cm RV Mid diam:    5.50 cm RV S prime:     8.41 cm/s TAPSE (M-mode): 2.3 cm LEFT ATRIUM              Index       RIGHT ATRIUM           Index LA diam:         5.10 cm  2.18 cm/m  RA Area:     28.20 cm LA Vol (A2C):   108.0 ml 46.15 ml/m RA Volume:   108.00 ml 46.15 ml/m LA Vol (A4C):   114.0 ml 48.71 ml/m LA Biplane Vol: 115.0 ml 49.14 ml/m  AORTIC VALVE AV Area (Vmax):    0.74 cm AV Area (Vmean):   0.67 cm AV Area (VTI):     0.73 cm AV Vmax:           289.00 cm/s  AV Vmean:          180.000 cm/s AV VTI:            0.442 m AV Peak Grad:      33.4 mmHg AV Mean Grad:      15.0 mmHg LVOT Vmax:         62.00 cm/s LVOT Vmean:        34.700 cm/s LVOT VTI:          0.093 m LVOT/AV VTI ratio: 0.21  AORTA Ao Root diam: 3.50 cm MR Peak grad: 114.1 mmHg  TRICUSPID VALVE MR Mean grad: 61.0 mmHg   TR Peak grad:   25.6 mmHg MR Vmax:      534.00 cm/s TR Vmax:        253.00 cm/s MR Vmean:     365.0 cm/s                           SHUNTS                           Systemic VTI:  0.09 m                           Systemic Diam: 2.10 cm Candee Furbish MD Electronically signed by Candee Furbish MD Signature Date/Time: 06/27/2020/4:48:49 PM    Final     Cardiac Studies   Echo 06/27/20: 1. Left ventricular ejection fraction, by estimation, is 25 to 30%. The  left ventricle has severely decreased function. The left ventricle  demonstrates global hypokinesis. The left ventricular internal cavity size  was mildly dilated. Left ventricular  diastolic parameters are indeterminate.  2. Right ventricular systolic function is moderately reduced. The right  ventricular size is moderately enlarged. There is mildly elevated  pulmonary artery systolic pressure. The estimated right ventricular  systolic pressure is 40.9 mmHg.  3. Left atrial size was moderately dilated.  4. Right atrial size was moderately dilated.  5. The mitral valve is degenerative. Moderate mitral valve regurgitation.  No evidence of mitral stenosis.  6. Tricuspid valve regurgitation is severe.  7. Low flow, low gradient aortic stenosis. DI is 0.20. The aortic valve  is tricuspid. There is severe  calcifcation of the aortic valve. There is  severe thickening of the aortic valve. Aortic valve regurgitation is not  visualized. Moderate to severe  aortic valve stenosis. Aortic valve area, by VTI measures 0.73 cm. Aortic  valve mean gradient measures 15.0 mmHg. Aortic valve Vmax measures 2.89  m/s.  8. The inferior vena cava is dilated in size with >50% respiratory  variability, suggesting right atrial pressure of 8 mmHg.   Cath 06/28/20:  Ost LM to Dist LM lesion is 80% stenosed.  Previously placed Ost LAD to Mid LAD stent (unknown type) is widely patent.  Hemodynamic findings consistent with aortic valve stenosis.   Patient Profile     54 y.o. male with a hx of CAD with s/p LAD PCI 06/2019 at [facility], AS, ESRD, tobacco use  Assessment & Plan    NSTEMI: p/w chest pain, peak high sensitivity troponin 302.  H/o stent to LAD.  Echo shows worsening cardiomyopathy, EF 25 to 30%.  Cath 11/1 shows 80% left main lesion.  Currently chest pain free -Continue ASA, atorva 80 mg, coreg 3.125 mg BID -Continue heparin gtt -Cardiac surgery consulted for CABG evaluation  Acute on  chronic combined systolic and diastolic heart failure: EF 25 to 30%.  Concerning for worsening ischemic cardiomyopathy as above.  HD for volume removal.  AS: concern for low flow low gradient severe AS (Vmax  2.9 m/s, MG 15 mmHg, AVA 0.7 cm^2, DI 0.2).  Cardiac surgery consulted for CABG/AVR evaluation as above.   HLD: lipitor 80 continue  HTN: continue home clonidine, will restart carvedilol  ESRD on HD MWF  Tobacco use: counseled on risks of tobacco use and cessation strongly encouraged  For questions or updates, please contact [place] Please consult [email or web address] for contact info under        Signed, Donato Heinz, MD  06/29/2020, 9:02 AM

## 2020-06-30 ENCOUNTER — Inpatient Hospital Stay (HOSPITAL_COMMUNITY): Payer: Medicare Other

## 2020-06-30 ENCOUNTER — Encounter (HOSPITAL_COMMUNITY): Payer: Self-pay | Admitting: Anesthesiology

## 2020-06-30 ENCOUNTER — Other Ambulatory Visit (HOSPITAL_COMMUNITY): Payer: Medicare Other

## 2020-06-30 DIAGNOSIS — Z992 Dependence on renal dialysis: Secondary | ICD-10-CM | POA: Diagnosis not present

## 2020-06-30 DIAGNOSIS — I200 Unstable angina: Secondary | ICD-10-CM | POA: Diagnosis not present

## 2020-06-30 DIAGNOSIS — N186 End stage renal disease: Secondary | ICD-10-CM | POA: Diagnosis not present

## 2020-06-30 DIAGNOSIS — I5043 Acute on chronic combined systolic (congestive) and diastolic (congestive) heart failure: Secondary | ICD-10-CM | POA: Diagnosis not present

## 2020-06-30 DIAGNOSIS — R0602 Shortness of breath: Secondary | ICD-10-CM | POA: Diagnosis not present

## 2020-06-30 DIAGNOSIS — R079 Chest pain, unspecified: Secondary | ICD-10-CM | POA: Diagnosis not present

## 2020-06-30 LAB — HEPARIN LEVEL (UNFRACTIONATED)
Heparin Unfractionated: 0.42 IU/mL (ref 0.30–0.70)
Heparin Unfractionated: 0.67 IU/mL (ref 0.30–0.70)

## 2020-06-30 LAB — BASIC METABOLIC PANEL
Anion gap: 20 — ABNORMAL HIGH (ref 5–15)
BUN: 80 mg/dL — ABNORMAL HIGH (ref 6–20)
CO2: 18 mmol/L — ABNORMAL LOW (ref 22–32)
Calcium: 8.4 mg/dL — ABNORMAL LOW (ref 8.9–10.3)
Chloride: 92 mmol/L — ABNORMAL LOW (ref 98–111)
Creatinine, Ser: 8.78 mg/dL — ABNORMAL HIGH (ref 0.61–1.24)
GFR, Estimated: 7 mL/min — ABNORMAL LOW (ref >60)
Glucose, Bld: 122 mg/dL — ABNORMAL HIGH (ref 70–99)
Potassium: 6.6 mmol/L (ref 3.5–5.1)
Sodium: 130 mmol/L — ABNORMAL LOW (ref 135–145)

## 2020-06-30 LAB — GLUCOSE, CAPILLARY
Glucose-Capillary: 273 mg/dL — ABNORMAL HIGH (ref 70–99)
Glucose-Capillary: 48 mg/dL — ABNORMAL LOW (ref 70–99)
Glucose-Capillary: 58 mg/dL — ABNORMAL LOW (ref 70–99)
Glucose-Capillary: 97 mg/dL (ref 70–99)

## 2020-06-30 LAB — CBC
HCT: 43.4 % (ref 39.0–52.0)
Hemoglobin: 13.9 g/dL (ref 13.0–17.0)
MCH: 27.5 pg (ref 26.0–34.0)
MCHC: 32 g/dL (ref 30.0–36.0)
MCV: 85.8 fL (ref 80.0–100.0)
Platelets: 149 10*3/uL — ABNORMAL LOW (ref 150–400)
RBC: 5.06 MIL/uL (ref 4.22–5.81)
RDW: 16.6 % — ABNORMAL HIGH (ref 11.5–15.5)
WBC: 11.1 10*3/uL — ABNORMAL HIGH (ref 4.0–10.5)
nRBC: 0 % (ref 0.0–0.2)

## 2020-06-30 LAB — POTASSIUM: Potassium: 4.2 mmol/L (ref 3.5–5.1)

## 2020-06-30 LAB — HEMOGLOBIN A1C
Hgb A1c MFr Bld: 6.1 % — ABNORMAL HIGH (ref 4.8–5.6)
Mean Plasma Glucose: 128.37 mg/dL

## 2020-06-30 MED ORDER — HYDROCODONE-ACETAMINOPHEN 5-325 MG PO TABS
1.0000 | ORAL_TABLET | Freq: Two times a day (BID) | ORAL | Status: DC | PRN
  Administered 2020-06-30: 2 via ORAL
  Administered 2020-07-01 (×2): 1 via ORAL
  Administered 2020-07-01 – 2020-07-03 (×4): 2 via ORAL
  Filled 2020-06-30: qty 2
  Filled 2020-06-30: qty 1
  Filled 2020-06-30: qty 2
  Filled 2020-06-30: qty 1
  Filled 2020-06-30: qty 2
  Filled 2020-06-30 (×2): qty 1
  Filled 2020-06-30: qty 2

## 2020-06-30 MED ORDER — LIDOCAINE 5 % EX PTCH
1.0000 | MEDICATED_PATCH | CUTANEOUS | Status: DC
  Filled 2020-06-30 (×3): qty 1

## 2020-06-30 MED ORDER — ACETAMINOPHEN 500 MG PO TABS
1000.0000 mg | ORAL_TABLET | Freq: Three times a day (TID) | ORAL | Status: DC
  Administered 2020-06-30 – 2020-07-03 (×5): 1000 mg via ORAL
  Filled 2020-06-30 (×8): qty 2

## 2020-06-30 MED ORDER — HYDRALAZINE HCL 25 MG PO TABS
25.0000 mg | ORAL_TABLET | Freq: Three times a day (TID) | ORAL | Status: DC
  Administered 2020-06-30 – 2020-07-03 (×8): 25 mg via ORAL
  Filled 2020-06-30 (×10): qty 1

## 2020-06-30 MED ORDER — INSULIN ASPART 100 UNIT/ML ~~LOC~~ SOLN
10.0000 [IU] | Freq: Once | SUBCUTANEOUS | Status: AC
  Administered 2020-06-30: 10 [IU] via SUBCUTANEOUS

## 2020-06-30 MED ORDER — DEXTROSE 50 % IV SOLN
INTRAVENOUS | Status: AC
  Administered 2020-06-30: 50 mL
  Filled 2020-06-30: qty 50

## 2020-06-30 MED ORDER — DEXTROSE 50 % IV SOLN
1.0000 | Freq: Once | INTRAVENOUS | Status: AC
  Administered 2020-06-30: 50 mL via INTRAVENOUS
  Filled 2020-06-30: qty 50

## 2020-06-30 MED ORDER — CALCIUM GLUCONATE-NACL 1-0.675 GM/50ML-% IV SOLN
1.0000 g | Freq: Once | INTRAVENOUS | Status: AC
  Administered 2020-06-30: 1000 mg via INTRAVENOUS
  Filled 2020-06-30: qty 50

## 2020-06-30 MED ORDER — ISOSORBIDE MONONITRATE ER 30 MG PO TB24
30.0000 mg | ORAL_TABLET | Freq: Every day | ORAL | Status: DC
  Administered 2020-06-30 – 2020-07-03 (×4): 30 mg via ORAL
  Filled 2020-06-30 (×5): qty 1

## 2020-06-30 NOTE — Progress Notes (Signed)
Patient had hypoglycemic episode earlier this am and he felt horrible. He feels better not that his "sugar is higher". He is waiting on breakfast. He denies chest heaviness/pain or shortness of breath. He is anxious "to get heart surgery done". On Heparin drip. OR tentatively scheduled for Friday with Dr. Orvan Seen.

## 2020-06-30 NOTE — Progress Notes (Signed)
ANTICOAGULATION CONSULT NOTE - Follow Up Consult  Pharmacy Consult for heparin Indication: CAD awaiting further CABG w/u  Labs: Recent Labs    06/27/20 1234 06/27/20 1424 06/27/20 1937 06/27/20 2123 06/28/20 0437 06/28/20 0437 06/28/20 1543 06/28/20 1547 06/29/20 0653 06/29/20 1804 06/30/20 0226  HGB   < >  --   --   --  14.7   < >   < > 16.3 14.9  --  13.9  HCT   < >  --   --   --  45.5  --    < > 48.0 46.4  --  43.4  PLT   < >  --   --   --  236  --   --   --  202  --  149*  HEPARINUNFRC  --   --   --   --  0.27*   < >  --   --  0.11* 0.25* 0.42  CREATININE   < >  --   --   --  10.03*  --   --   --  7.49*  --  8.78*  TROPONINIHS  --  279* 263* 302*  --   --   --   --   --   --   --    < > = values in this interval not displayed.    Assessment/Plan:  54yo male therapeutic on heparin after rate change. Will continue gtt at current rate and confirm stable with additional level.   Wynona Neat, PharmD, BCPS  06/30/2020,4:10 AM

## 2020-06-30 NOTE — Progress Notes (Signed)
Hypoglycemic Event  CBG: 0616- 58  Treatment: Juice/ Peanut butter/ ampule of D50    Symptoms: Diaphoretic, hot, clammy   Follow-up CBG: [id number]:2091 CBG Result: 48  0629- 273  Possible Reasons for Event: Insulin given for high potassium   Comments/MD notified: Protocol followed MD paged    Francoise Ceo Pasha Broad

## 2020-06-30 NOTE — Progress Notes (Addendum)
Family Medicine Teaching Service Daily Progress Note Intern Pager: [phone]  Patient name: Marco Arnold Medical record number: [id number] Date of birth: [birth year] Age: 54 y.o. Gender: male  Primary Care Provider: Cyndi Bender, PA-C Consultants: Cardiology  Code Status: Full   Pt Overview and Major Events to Date:  Admitted 10/31  Assessment and Plan: Marco A Reeseis a 54 y.o.malesmokerpresenting withchest tightness and dyspnea likely due to CHF vs ACS. PMH is significant forESRD, CAD s/p PCI of LAD (06/2019), HTN, ASandchronic low back pain.  Chest tightness   ACS   Acute on chronic combined systolic and diastolic HF Denies chest pain and dyspnea. History significant for CAD with prior LAD stent in 06/2019. Initially EKG demonstrated no ST elevations. CXR demonstrated enlarged cardiac silhouette and calcific atherosclerotic disease of the aorta, no focal airspace consolidation. Troponins trended 279>263>302. Echo demonstrated worsening EF 25-30% with severely decreased function of the LV and LV global hyperkinesis. Comparable to last echo a year ago that was notable for EF 35-40% with worsening cardiomyopathy.Cath performed on 11/1 notable for 80% distal left main stenosis with findings consistent with aortic valve stenosis. Cardiology recommended possible CABG possibly along with aortic valve replacement given cath results.  - cardiology and heart failure team following, appreciatecontinuedinvolvement, follow cardiothoracic surgery recommendations, CABG scheduled tentatively for 11/5 with Dr. Orvan Seen  - heparin gtt per cardiology - continue ASA 81 mg - continue atorvastatin 80 mg - continue carvedilol 3.125 mg bid  - cardiac monitoring - monitor daily weights - likely needs regular outpatient cardiology follow up   ESRD Scheduled to get HD today. Last HD session on 11/1. HD MWF schedule.Likely secondary to hypertensive renal disease.Most recent labs notable for Cr 8.78,  BUN 80 and GFR 7 compared to on admission Cr 9.09, BUN 81 and GFR 6. Avoid morphine for pain control due to renal function.  - nephrologyfollowing - continue Rena-Vit, lanthanum  Electrolyte abnormalities Hyponatremia of 130 and hyperkalemia 6.6, likely hemolyzed. Normalized on repeat K 4.2.  -monitor daily BMP  HTN Fluctuating blood pressures with multiple hypertensive episodes likely secondary to chronic back pain. BP 145/102 this morning.BP 130/91 on admission. Home meds: amlodipine 10 mg, carvedilol 3.125 mg BID, clonidine 0.2 mg daily. Reported not taking amlodipine or carvedilol prior to admission. - consider discontinuing clonidine - continue carvedilol, increase dosage as needed  -continue to monitor   Chronic low back pain History ofchronic back pain and degenerative disc disease. He states he has tried several medications and has been to pain clinics but the only thing that helps him is "horse Liniments". Home meds: cyclobenzaprine, naproxen prn. Given ESRD status, dilautid is preferred but patient states that it has not worked to relieve pain as well in the past.  - continue cyclobenzaprine -scheduled tylenol 1g q8h  - lidocaine patch  -discontinued tylenol PRN for pain  - norco q12 prn - avoid morphine given ESRD status   GERD On pantoprazole 40 mg daily. - continue protonix  Tobacco use disorder Smokes about 1ppd. Started smoking when he was 54 yo.  - refused nicotine patch - continue to encourage tobacco cessation  Prediabetes  A1c 6.1. CBG [phone]. No home medications. Patient had a hyperglycemic episode and received 10 U insulin. Denies dizziness or vision changes.  -diet and exercise counseling, lifestyle modifications - f/u with PCP    FEN/GI: renal diet  PPx: heparin    Status is: Inpatient  Disposition: inpatient        Subjective:  No acute overnight  events reported. Patient about to leave for HD when I walked in. Denies any  chest pain, dyspnea and dizziness. Denies any new concerns at this time.  Objective: Temp:  [97.3 F (36.3 C)-98.6 F (37 C)] 97.6 F (36.4 C) (11/03 0350) Pulse Rate:  [68-87] 68 (11/03 0620) Resp:  [16-20] 16 (11/03 0620) BP: (102-145)/(85-102) 145/102 (11/03 0620) SpO2:  [92 %-100 %] 98 % (11/03 0620) Weight:  [101.7 kg] 101.7 kg (11/03 0350) Physical Exam: General: Patient sitting upright in the chair, in no acute distress. Cardiovascular: RRR, no murmurs or gallops auscultated  Respiratory: lungs clear to auscultation bilaterally  Abdomen: soft, nontender, presence of active bowel sounds Extremities: 2+ pitting LE edema bilaterally, radial and distal pulses intact bilaterally  Derm: skin warm and dry to touch, chronic systemic healed lesions throughout body  Psych: mood appropriate, pleasant   Laboratory: Recent Labs  Lab 06/28/20 0437 06/28/20 1543 06/28/20 1547 06/29/20 0653 06/30/20 0226  WBC 9.7  --   --  9.9 11.1*  HGB 14.7   < > 16.3 14.9 13.9  HCT 45.5   < > 48.0 46.4 43.4  PLT 236  --   --  202 149*   < > = values in this interval not displayed.   Recent Labs  Lab 06/28/20 0437 06/28/20 1543 06/28/20 1547 06/29/20 0653 06/30/20 0226  NA 129*   < > 135 133* 130*  K 5.9*   < > 3.9 4.3 6.6*  CL 86*  --   --  90* 92*  CO2 16*  --   --  21* 18*  BUN 94*  --   --  63* 80*  CREATININE 10.03*  --   --  7.49* 8.78*  CALCIUM 9.2  --   --  8.8* 8.4*  GLUCOSE 104*  --   --  139* 122*   < > = values in this interval not displayed.      Imaging/Diagnostic Tests: No results found.  Donney Dice, DO 06/30/2020, 6:33 AM PGY-1, [place] Intern pager: [phone], text pages welcome

## 2020-06-30 NOTE — Progress Notes (Signed)
CRITICAL VALUE ALERT  Critical Value:  Potassium 6.6  Date & Time Notied:  1587 06/30/20 Notified [place]

## 2020-06-30 NOTE — Progress Notes (Signed)
Advanced Heart Failure Team Consult Note   Primary Physician: Cyndi Bender, PA-C PCP-Cardiologist:  No primary care provider on file.  Reason for Consultation: Heart Failure   HPI:    Marco Arnold is seen today for evaluation of heart failure at the request of Dr Orvan Seen.   Marco Arnold is a 54 year old with history of  CAD with LAD and PCI 06/2019 one stent, HTN, tobacco abuse, and ESRD M-W-F.   Presented to [facility] with shortness of breath/chest heaviness that had lasted 24 hrs. For about 2-3 weeks prior, he had had peripheral edema (this is new for him). HS Trop 302 without significant trend. ECHO showed EF was down to 25%. Taken for cath and this showed 80% stenosis distal left main (no significant AoV gradient on pull-back). CT surgery consulted for CABG and possible AVR/MVR.   Echo (personally reviewed): EF about 25% (septal hypokinesis worse than lateral wall), RV moderately reduced function, LA moderately dilated, TV mod-severe regurgitation, aortic valve severe calcification with mean gradient 15 and AVA calculated 0.7 => visually looks like moderate stenosis to me.  Moderate Marco.   Echo- At [facility] (11/20), EF 35-40% , RV normal,  moderate aortic stenosis.   LHC 06/28/20  Ost LM to Dist LM lesion is 80% stenosed.  Previously placed Ost LAD to Mid LAD stent (unknown type) is widely patent  RA 11  PCWP 13  CO 3.4 CI 2.3  Fick CO 5/CI 2.1    LHC 2020 [place]-  Mild aortic stenosis pre and post dobutamine infusion  elevated PA(38) and PCWP (23) . Low output/gradient severe AS PCI Stent LAD   Review of Systems: All systems reviewed and negative except as per HPI.  Home Medications Prior to Admission medications   Medication Sig Start Date End Date Taking? Authorizing Provider  acetaminophen (TYLENOL 8 HOUR) 650 MG CR tablet Take 1 tablet (650 mg total) by mouth every 8 (eight) hours as needed for pain. 03/23/20  Yes Petrucelli, Glynda Jaeger, PA-C  aspirin EC 81 MG tablet  Take 81 mg by mouth daily.   Yes [provider]  cloNIDine (CATAPRES) 0.1 MG tablet Take 0.2 mg by mouth daily. 07/13/16  Yes [provider]  cyclobenzaprine (FLEXERIL) 10 MG tablet Take 20 mg by mouth 2 (two) times daily as needed for muscle spasms.    Yes [provider]  FOSRENOL 1000 MG chewable tablet Chew 6,000 mg by mouth 3 (three) times daily with meals.  08/31/17  Yes [provider]  hydrOXYzine (ATARAX/VISTARIL) 25 MG tablet Take 25 mg by mouth 2 (two) times daily as needed for anxiety or itching (Sleep).    Yes [provider]  lidocaine (LIDODERM) 5 % Place 1 patch onto the skin daily as needed. Apply patch to area most significant pain once per day.  Remove and discard patch within 12 hours of application. Patient taking differently: Place 1 patch onto the skin daily as needed (Apply patch to area most significant pain once per day.  Remove and discard patch within 12 hours of application.).  03/23/20  Yes Petrucelli, Samantha R, PA-C  multivitamin (RENA-VIT) TABS tablet Take 1 tablet by mouth See admin instructions. Every Monday, Wednesday and Friday   Yes [provider]  naproxen sodium (ALEVE) 220 MG tablet Take 220 mg by mouth 2 (two) times daily as needed (pain).    Yes [provider]  pantoprazole (PROTONIX) 40 MG tablet Take 40 mg by mouth daily.  12/04/12  Yes [provider]  VENTOLIN HFA 108 (90 Base) MCG/ACT inhaler Inhale 2 puffs into the lungs every 6 (six) hours as needed for wheezing or shortness of breath.  12/21/17  Yes [provider]  amLODipine (NORVASC) 10 MG tablet Take 10 mg by mouth at bedtime.  Patient not taking: Reported on 06/15/2020    [provider]  atorvastatin (LIPITOR) 80 MG tablet Take 80 mg by mouth daily.  Patient not taking: Reported on 06/15/2020 01/16/19   [provider]  carvedilol (COREG) 3.125 MG tablet Take 3.125 mg by mouth 2 (two) times daily  with a meal.  Patient not taking: Reported on 06/15/2020 01/16/19   [provider]  DULoxetine (CYMBALTA) 60 MG capsule Take 60 mg by mouth every morning.  Patient not taking: Reported on 06/15/2020 01/29/18   [provider]  gabapentin (NEURONTIN) 100 MG capsule Take 3 capsules (300 mg total) by mouth at bedtime. Patient not taking: Reported on 06/15/2020 09/20/17   Kathrynn Ducking, MD  HYDROcodone-acetaminophen [facility]) 5-325 MG tablet Take 1 tablet by mouth every 6 (six) hours as needed for moderate pain. Patient not taking: Reported on 06/15/2020 04/08/19   Dagoberto Ligas, PA-C  nortriptyline (PAMELOR) 25 MG capsule Take 25 mg by mouth every morning.  Patient not taking: Reported on 06/15/2020 01/29/18   [provider]    Past Medical History: Past Medical History:  Diagnosis Date  . Arthritis    spine  . Chronic kidney disease    [place] MWF  . Coronary artery disease   . Dialysis patient [facility]) 2013  . Diverticulitis   . Hyperlipidemia   . Hypertension    dr  Tobie Lords      @ [facility]  med  . Neuromuscular disorder ([place])    nerve pain - tx w/cymbalta  . Neuropathic pain 09/20/2017   Left mid-thoracic    Past Surgical History: Past Surgical History:  Procedure Laterality Date  . AV FISTULA PLACEMENT  11/10/2011   Procedure: ARTERIOVENOUS (AV) FISTULA CREATION;  Surgeon: Mal Misty, MD;  Location: [place];  Service: Vascular;  Laterality: Left;  Ultrasound guided  . CARDIAC CATHETERIZATION  09/19/2018   On stent study at [facility]  . COLONOSCOPY    . INSERTION OF DIALYSIS CATHETER  02/09/2012   Procedure: INSERTION OF DIALYSIS CATHETER;  Surgeon: Mal Misty, MD;  Location: [place];  Service: Vascular;  Laterality: N/A;  . MASS EXCISION Right 09/07/2018   Procedure: EXCISION MASS RIGHT THUMB;  Surgeon: Charlotte Crumb, MD;  Location: [place];  Service: Orthopedics;  Laterality: Right;  OR AXILLARY BLOCK  . REVISON OF ARTERIOVENOUS FISTULA Left  04/08/2019   Procedure: REVISION PLICATION OF LEFT ARM ARTERIOVENOUS FISTULA;  Surgeon: Serafina Mitchell, MD;  Location: [place];  Service: Vascular;  Laterality: Left;  . RIGHT/LEFT HEART CATH AND CORONARY ANGIOGRAPHY N/A 06/28/2020   Procedure: RIGHT/LEFT HEART CATH AND CORONARY ANGIOGRAPHY;  Surgeon: Lorretta Harp, MD;  Location: [place] CV LAB;  Service: Cardiovascular;  Laterality: N/A;  . SHUNTOGRAM Left 06/03/2012   Procedure: Earney Mallet;  Surgeon: Conrad Burnsville, MD;  Location: [facility] CATH LAB;  Service: Cardiovascular;  Laterality: Left;  . SHUNTOGRAM Left 01/02/2013   Procedure: SHUNTOGRAM;  Surgeon: Conrad Wolford, MD;  Location: [facility] CATH LAB;  Service: Cardiovascular;  Laterality: Left;    Family History: Family History  Problem Relation Age of Onset  . Pancreatic cancer Mother   . Diabetes Father   .  Hypertension Father   . Cancer - Lung Paternal Grandfather     Social History: Social History   Socioeconomic History  . Marital status: Divorced    Spouse name: Not on file  . Number of children: 2  . Years of education: 77  . Highest education level: Not on file  Occupational History  . Occupation: disabled  Tobacco Use  . Smoking status: Light Tobacco Smoker    Packs/day: 0.25    Years: 43.00    Pack years: 10.75    Types: Cigarettes  . Smokeless tobacco: Never Used  . Tobacco comment: pt states that he is trying to quit--is trying to cut back at the current time  Vaping Use  . Vaping Use: Former  . Quit date: 08/28/2018  Substance and Sexual Activity  . Alcohol use: Yes    Comment: occasional  . Drug use: Yes    Types: Marijuana  . Sexual activity: Not on file  Other Topics Concern  . Not on file  Social History Narrative   Lives with father   Caffeine use: none   Right handed    Social Determinants of Health   Financial Resource Strain:   . Difficulty of Paying Living Expenses: Not on file  Food Insecurity:   . Worried About Charity fundraiser in the  Last Year: Not on file  . Ran Out of Food in the Last Year: Not on file  Transportation Needs:   . Lack of Transportation (Medical): Not on file  . Lack of Transportation (Non-Medical): Not on file  Physical Activity:   . Days of Exercise per Week: Not on file  . Minutes of Exercise per Session: Not on file  Stress:   . Feeling of Stress : Not on file  Social Connections:   . Frequency of Communication with Friends and Family: Not on file  . Frequency of Social Gatherings with Friends and Family: Not on file  . Attends Religious Services: Not on file  . Active Member of Clubs or Organizations: Not on file  . Attends Archivist Meetings: Not on file  . Marital Status: Not on file    Allergies:  Allergies  Allergen Reactions  . Lisinopril Shortness Of Breath    Unable to breath    Objective:    Vital Signs:   Temp:  [97.3 F (36.3 C)-98.6 F (37 C)] 97.6 F (36.4 C) (11/03 0350) Pulse Rate:  [68-87] 68 (11/03 0620) Resp:  [16-20] 16 (11/03 0620) BP: (102-145)/(85-102) 145/102 (11/03 0620) SpO2:  [92 %-100 %] 98 % (11/03 0620) Weight:  [101.7 kg] 101.7 kg (11/03 0350) Last BM Date: 06/29/20  Weight change: Filed Weights   06/28/20 1010 06/29/20 0519 06/30/20 0350  Weight: 103.7 kg 99.6 kg 101.7 kg    Intake/Output:   Intake/Output Summary (Last 24 hours) at 06/30/2020 0843 Last data filed at 06/30/2020 0600 Gross per 24 hour  Intake 1405.05 ml  Output --  Net 1405.05 ml      Physical Exam    General:  Well appearing. No resp difficulty HEENT: normal Neck: supple. JVP 10-12 cm. Carotids 2+ bilat; no bruits. No lymphadenopathy or thyromegaly appreciated. Cor: PMI nondisplaced. Regular rate & rhythm. No S3/S4. 3/6 SEM RUSB with clear S2. Lungs: clear Abdomen: soft, nontender, nondistended. No hepatosplenomegaly. No bruits or masses. Good bowel sounds. Extremities: no cyanosis, clubbing, rash.  1+ ankle edema . Neuro: alert & orientedx3, cranial  nerves grossly intact. moves all 4 extremities w/o  difficulty. Affect pleasant   Telemetry   NSR, 80s (personally reviewed)  EKG    NSR, RBBB-like IVCD (181 msec).  This is similar to 2020 ECG.  Personally reviewed.   Labs   Basic Metabolic Panel: Recent Labs  Lab 06/27/20 1234 06/27/20 1234 06/28/20 0437 06/28/20 0437 06/28/20 1543 06/28/20 1547 06/29/20 0653 06/30/20 0226 06/30/20 0659  NA 130*   < > 129*  --  136  136 135 133* 130*  --   K 5.0   < > 5.9*   < > 4.0  4.0 3.9 4.3 6.6* 4.2  CL 84*  --  86*  --   --   --  90* 92*  --   CO2 21*  --  16*  --   --   --  21* 18*  --   GLUCOSE 101*  --  104*  --   --   --  139* 122*  --   BUN 81*  --  94*  --   --   --  63* 80*  --   CREATININE 9.09*  --  10.03*  --   --   --  7.49* 8.78*  --   CALCIUM 9.1   < > 9.2  --   --   --  8.8* 8.4*  --   PHOS  --   --  >30.0*  --   --   --  10.9*  --   --    < > = values in this interval not displayed.    Liver Function Tests: Recent Labs  Lab 06/28/20 0437 06/29/20 0653  ALBUMIN 3.3* 3.2*   No results for input(s): LIPASE, AMYLASE in the last 168 hours. No results for input(s): AMMONIA in the last 168 hours.  CBC: Recent Labs  Lab 06/27/20 1234 06/27/20 1234 06/28/20 0437 06/28/20 1543 06/28/20 1547 06/29/20 0653 06/30/20 0226  WBC 9.3  --  9.7  --   --  9.9 11.1*  HGB 13.8   < > 14.7 16.7  16.7 16.3 14.9 13.9  HCT 43.3   < > 45.5 49.0  49.0 48.0 46.4 43.4  MCV 86.4  --  85.2  --   --  86.9 85.8  PLT 202  --  236  --   --  202 149*   < > = values in this interval not displayed.    Cardiac Enzymes: No results for input(s): CKTOTAL, CKMB, CKMBINDEX, TROPONINI in the last 168 hours.  BNP: BNP (last 3 results) No results for input(s): BNP in the last 8760 hours.  ProBNP (last 3 results) No results for input(s): PROBNP in the last 8760 hours.   CBG: Recent Labs  Lab 06/30/20 0616 06/30/20 0624 06/30/20 0629 06/30/20 0715  GLUCAP 58* 48* 273* 97     Coagulation Studies: No results for input(s): LABPROT, INR in the last 72 hours.   Imaging    No results found.   Medications:     Current Medications: . aspirin  81 mg Oral Daily  . atorvastatin  80 mg Oral Daily  . carvedilol  3.125 mg Oral BID WC  . Chlorhexidine Gluconate Cloth  6 each Topical Q0600  . cloNIDine  0.2 mg Oral Daily  . lanthanum  4,000 mg Oral TID WC  . multivitamin  1 tablet Oral Q M,W,F  . nicotine  21 mg Transdermal Daily  . pantoprazole  40 mg Oral Daily  . sodium chloride flush  3 mL Intravenous Q12H  .  sodium chloride flush  3 mL Intravenous Q12H     Infusions: . sodium chloride    . heparin 1,800 Units/hr (06/30/20 0420)       Patient Profile   Marco Arnold is a 54 year old with history of  CAD with LAD and PCI 06/2019 one stent, HTN, tobacco abuse, and ESRD M-W-F.   Cath with ostial left main to distal left main lesion is 80% stenosed and previously placed Ost LAD to Mid LAD stent (unknown type) is widely patent   Assessment/Plan   1. CAD: Patient with prior history of PCI to LAD in 11/20.  Says he had 2 stents prior to this as well.  Presentation seems more likely CHF exacerbation.  HS-TnI was mildly elevated (peak 302) without a trend, seems more likely to be demand ischemia due to volume overload than actual ACS.  ECG unchanged (RBBB-like IVCD).  LHC showed a tight eccentric/calcified 80% distal left main stenosis.  He has not had viability study but does not have thinning/scarring and has some thickening of all walls, so suspect significant viable tissue.  - Discussed with Dr. Orvan Seen, plan for high-risk CABG on Friday.   - Continue ASA and high dose statin.  - He is on heparin gtt (see below re: fall in platelets).  2. Acute on chronic systolic CHF: Echo in past (11/20) with EF 35-40% range, suspected ischemic cardiomyopathy.  Echo now with EF down to 25% on my review with mild-moderate RV dysfunction.  Cardiac index on RHC 2.3 by Fick  and 2.1 by thermodilution.  On exam, he remains volume overloaded but getting HD this morning.  BP generally runs high.  - Volume control via HD, needs fluid removed today.  - Continue low dose Coreg 3.125 mg bid.  - Would avoid clonidine with cardiomyopathy.  Also may not tolerate ACEI/ARB/ARNI as it appears that he has a tendency towards periodic hyperkalemia despite HD (can discuss with renal however).  For now, will have him on hydralazine 25 mg tid and Imdur 30 mg daily to replace clonidine.  - CABG will be high risk due to cardiomyopathy.  Suspect he will need Impella 5.5 peri-operatively.  - Long-term, if EF does not improve will need to consider ICD.  With wide IVCD, he may be CRT candidate (though looks more RBBB-like).  3. Valvular heart disease: Echo with AoV mean gradient 15 mmHg but calculated AVA 0.7 cm^2.  No significant pullback gradient across the valve on cath. Suspect moderate aortic stenosis.  There is also moderate-severe TR and moderate Marco.  - Will get TEE tomorrow for better look at the valves prior to surgery to help guide valve repair/replacement needs.  Discussed risks/benefits with patient and he agrees to procedure.  4. ESRD: Since 2013, patients states it was started due to HTN.  Has not been transplant candidate due to his cardiac disease.  5. HTN: Med changes as above.  Would like to avoid clonidine.  6. Thrombocytopenia: Platelets dropped from 202 at admission to 149 today.  He is on heparin gtt.  Discussed with pharmacy, think drop too quick for HIT but will need to follow closely.  Can continue heparin for now.  7. Active smoker: Needs to quit.  Has nicotine patch.   Loralie Champagne 06/30/2020 9:49 AM

## 2020-06-30 NOTE — Progress Notes (Addendum)
[facility] Progress Note  Subjective: seen on HD, no c/o today  Vitals:   06/30/20 0850 06/30/20 0900 06/30/20 0910 06/30/20 0920  BP:   (!) 153/90   Pulse:      Resp: 17 14 15 12   Temp:      TempSrc:      SpO2:      Weight:      Height:        Exam:   alert, nad   no jvd  Chest cta bilat  Cor reg no RG  Abd soft ntnd no ascites obese   Ext 1-2+ ankle edema   Alert, NF, ox3   LUE AVF+bruit     OP HD: [place]  MWF   4.5h  450/ 2.0  99.5kg  2/2 bath  P2  15g  Hep 3000  LUE AVF   - max UF is 7kg during week and 9kg on mondays   Hectorol 7 mcg IVP q HD   Sensipar 60mg  PO q HD   Lanthanum 4 tabs PO q meal  Assessment/ Plan: 1. NSTEMI: Troponin elevated but flat. Echo showed worsening CHF with EF 25-30%. LHC showed severe L main lesion, patent old LAD stent.  TCTS planning CABG for Friday. For TEE tomorrow.  2.  ESRD:  On MWF schedule. HD today, UF 3L. Consider HD Thursday pre -op as will miss Friday.  3.  Hypertension/volume: BP meds per cards, on imdur, hydralazine and coreg now. LVEDP was good at [facility] at 78mm Hg.  4.  Anemia: Hgb >12, no ESA indicated.  5.  Metabolic bone disease: Phos >30 on labs today. Outpatient phos usually around 13. Pt is not compliant with renal diet. Continue lanthanum and will recheck phos with AM labs.  6.  Nutrition:  Alb 3.3. Currently NPO  7. HD access: Has recently been referred for fistulogram, will consider completing inpatient if issues with cannulation here.     Rob Camerin Ladouceur 06/30/2020, 9:58 AM   Recent Labs  Lab 06/28/20 0437 06/28/20 1543 06/29/20 0653 06/29/20 0653 06/30/20 0226 06/30/20 0659  K 5.9*   < > 4.3   < > 6.6* 4.2  BUN 94*  --  63*  --  80*  --   CREATININE 10.03*  --  7.49*  --  8.78*  --   CALCIUM 9.2  --  8.8*  --  8.4*  --   PHOS >30.0*  --  10.9*  --   --   --   HGB 14.7   < > 14.9  --  13.9  --    < > = values in this interval not displayed.   Inpatient medications: . aspirin  81 mg  Oral Daily  . atorvastatin  80 mg Oral Daily  . carvedilol  3.125 mg Oral BID WC  . Chlorhexidine Gluconate Cloth  6 each Topical Q0600  . hydrALAZINE  25 mg Oral Q8H  . isosorbide mononitrate  30 mg Oral Daily  . lanthanum  4,000 mg Oral TID WC  . multivitamin  1 tablet Oral Q M,W,F  . nicotine  21 mg Transdermal Daily  . pantoprazole  40 mg Oral Daily  . sodium chloride flush  3 mL Intravenous Q12H  . sodium chloride flush  3 mL Intravenous Q12H   . sodium chloride    . heparin 1,800 Units/hr (06/30/20 0420)   sodium chloride, acetaminophen **OR** acetaminophen, acetaminophen, albuterol, cyclobenzaprine, diclofenac Sodium, HYDROcodone-acetaminophen, hydrOXYzine, lidocaine, ondansetron (ZOFRAN) IV, polyethylene glycol, sodium  chloride flush

## 2020-06-30 NOTE — Progress Notes (Signed)
ANTICOAGULATION CONSULT NOTE  Pharmacy Consult for Heparin Indication: chest pain/ACS  Allergies  Allergen Reactions   Lisinopril Shortness Of Breath    Unable to breath    Patient Measurements: Height: 6' 3.5" (191.8 cm) Weight: 103.1 kg (227 lb 4.7 oz) IBW/kg (Calculated) : 85.65 Heparin Dosing Weight: 104kg  Vital Signs: Temp: 97.7 F (36.5 C) (11/03 0825) Temp Source: Oral (11/03 0825) BP: 163/96 (11/03 1210) Pulse Rate: 68 (11/03 0825)  Labs: Recent Labs    06/27/20 1937 06/27/20 2123 06/28/20 0437 06/28/20 0437 06/28/20 1543 06/28/20 1547 06/29/20 0653 06/29/20 0653 06/29/20 1804 06/30/20 0226 06/30/20 1000  HGB  --   --  14.7   < >   < > 16.3 14.9  --   --  13.9  --   HCT  --   --  45.5  --    < > 48.0 46.4  --   --  43.4  --   PLT  --   --  236  --   --   --  202  --   --  149*  --   HEPARINUNFRC  --   --  0.27*   < >  --   --  0.11*   < > 0.25* 0.42 0.67  CREATININE  --   --  10.03*  --   --   --  7.49*  --   --  8.78*  --   TROPONINIHS 263* 302*  --   --   --   --   --   --   --   --   --    < > = values in this interval not displayed.    Estimated Creatinine Clearance: 12.6 mL/min (A) (by C-G formula based on SCr of 8.78 mg/dL (H)).  Assessment: 54 yo M with ESRD and hx LAD stent 06/2019 with chest pain. No AC PTA. Pharmacy consulted for heparin.   Cath on 11/1 with 80% ost-distal LM lesion, in-stent restenosis, hemodynamic findings consistent with aortic valve stenosis, CABG planned for 11/5.  Today's heparin level is therapeutic at 0.67, trending upwards and anticipate will be supratherapeutic given recent boluses and rate increases. Will decrease rate slightly to maintain therapeutic level.  Platelets trending down, < 30% decrease from baseline, low probability of HIT, will continue to monitor daily. Hemoglobin wnl and stable. No signs or symptoms of bleeding reported.  Goal of Therapy:  Heparin level 0.3-0.7 units/ml Monitor platelets by  anticoagulation protocol: Yes   Plan:  Decrease heparin to 1700 units/hr  Daily heparin level, CBC Monitor s/sx bleeding  Fara Olden, PharmD PGY-1 Pharmacy Resident 06/30/2020 2:41 PM Please see AMION for all pharmacy numbers

## 2020-06-30 NOTE — Progress Notes (Signed)
MD contacted for NPO order for pt's TEE 11/4 @2PM . Pt will be NPO after midnight 11/4.

## 2020-06-30 NOTE — Progress Notes (Signed)
Interim progress note  Received text page from [place] that patient did not have an n.p.o. order in place for his TEE.  Also noted that he did not have consent form signed for the procedure tomorrow.  We called Kathlee Nations back at the number provided in the text page.  Instructed her that cardiology will most likely perform the consent for the patient directly prior to his procedure, otherwise cardiology should be contacted regarding that question.  Order placed for n.p.o. at midnight except for sips with medications.  That was a great catch regarding the missing n.p.o. order! Instructed her to contact us should she have any other questions.  Thank you, Kathlee Nations!   Milus Banister, [place], PGY-3 06/30/2020 8:15 PM

## 2020-07-01 ENCOUNTER — Inpatient Hospital Stay (HOSPITAL_COMMUNITY): Payer: Medicare Other | Admitting: Certified Registered"

## 2020-07-01 ENCOUNTER — Encounter (HOSPITAL_COMMUNITY): Payer: Self-pay | Admitting: Family Medicine

## 2020-07-01 ENCOUNTER — Inpatient Hospital Stay (HOSPITAL_COMMUNITY): Payer: Medicare Other

## 2020-07-01 ENCOUNTER — Encounter (HOSPITAL_COMMUNITY)
Admission: EM | Disposition: A | Discharge status: Home / Self Care | Payer: Self-pay | Source: Home / Self Care | Attending: Family Medicine

## 2020-07-01 DIAGNOSIS — Z992 Dependence on renal dialysis: Secondary | ICD-10-CM | POA: Diagnosis not present

## 2020-07-01 DIAGNOSIS — M7989 Other specified soft tissue disorders: Secondary | ICD-10-CM | POA: Diagnosis not present

## 2020-07-01 DIAGNOSIS — I350 Nonrheumatic aortic (valve) stenosis: Secondary | ICD-10-CM

## 2020-07-01 DIAGNOSIS — L989 Disorder of the skin and subcutaneous tissue, unspecified: Secondary | ICD-10-CM

## 2020-07-01 DIAGNOSIS — I340 Nonrheumatic mitral (valve) insufficiency: Secondary | ICD-10-CM

## 2020-07-01 DIAGNOSIS — R7303 Prediabetes: Secondary | ICD-10-CM | POA: Diagnosis not present

## 2020-07-01 DIAGNOSIS — I200 Unstable angina: Secondary | ICD-10-CM | POA: Diagnosis not present

## 2020-07-01 DIAGNOSIS — I5043 Acute on chronic combined systolic (congestive) and diastolic (congestive) heart failure: Secondary | ICD-10-CM | POA: Diagnosis not present

## 2020-07-01 DIAGNOSIS — R0602 Shortness of breath: Secondary | ICD-10-CM | POA: Diagnosis not present

## 2020-07-01 DIAGNOSIS — N186 End stage renal disease: Secondary | ICD-10-CM | POA: Diagnosis not present

## 2020-07-01 HISTORY — PX: TEE WITHOUT CARDIOVERSION: SHX5443

## 2020-07-01 LAB — PULMONARY FUNCTION TEST
FEF 25-75 Pre: 1.44 L/sec
FEF2575-%Pred-Pre: 37 %
FEV1-%Pred-Pre: 54 %
FEV1-Pre: 2.47 L
FEV1FVC-%Pred-Pre: 88 %
FEV6-%Pred-Pre: 60 %
FEV6-Pre: 3.46 L
FEV6FVC-%Pred-Pre: 99 %
FVC-%Pred-Pre: 61 %
FVC-Pre: 3.62 L
Pre FEV1/FVC ratio: 68 %
Pre FEV6/FVC Ratio: 96 %

## 2020-07-01 LAB — CBC WITH DIFFERENTIAL/PLATELET
Abs Immature Granulocytes: 0.06 10*3/uL (ref 0.00–0.07)
Basophils Absolute: 0.1 10*3/uL (ref 0.0–0.1)
Basophils Relative: 1 %
Eosinophils Absolute: 1 10*3/uL — ABNORMAL HIGH (ref 0.0–0.5)
Eosinophils Relative: 11 %
HCT: 44 % (ref 39.0–52.0)
Hemoglobin: 14.2 g/dL (ref 13.0–17.0)
Immature Granulocytes: 1 %
Lymphocytes Relative: 9 %
Lymphs Abs: 0.8 10*3/uL (ref 0.7–4.0)
MCH: 28.1 pg (ref 26.0–34.0)
MCHC: 32.3 g/dL (ref 30.0–36.0)
MCV: 87.1 fL (ref 80.0–100.0)
Monocytes Absolute: 0.9 10*3/uL (ref 0.1–1.0)
Monocytes Relative: 10 %
Neutro Abs: 6.6 10*3/uL (ref 1.7–7.7)
Neutrophils Relative %: 68 %
Platelets: 123 10*3/uL — ABNORMAL LOW (ref 150–400)
RBC: 5.05 MIL/uL (ref 4.22–5.81)
RDW: 16.9 % — ABNORMAL HIGH (ref 11.5–15.5)
WBC: 9.4 10*3/uL (ref 4.0–10.5)
nRBC: 0 % (ref 0.0–0.2)

## 2020-07-01 LAB — BASIC METABOLIC PANEL
Anion gap: 15 (ref 5–15)
BUN: 47 mg/dL — ABNORMAL HIGH (ref 6–20)
CO2: 24 mmol/L (ref 22–32)
Calcium: 8.8 mg/dL — ABNORMAL LOW (ref 8.9–10.3)
Chloride: 93 mmol/L — ABNORMAL LOW (ref 98–111)
Creatinine, Ser: 7.06 mg/dL — ABNORMAL HIGH (ref 0.61–1.24)
GFR, Estimated: 9 mL/min — ABNORMAL LOW (ref >60)
Glucose, Bld: 112 mg/dL — ABNORMAL HIGH (ref 70–99)
Potassium: 4.2 mmol/L (ref 3.5–5.1)
Sodium: 132 mmol/L — ABNORMAL LOW (ref 135–145)

## 2020-07-01 LAB — HEPARIN LEVEL (UNFRACTIONATED): Heparin Unfractionated: 0.5 IU/mL (ref 0.30–0.70)

## 2020-07-01 LAB — SURGICAL PCR SCREEN
MRSA, PCR: POSITIVE — AB
Staphylococcus aureus: POSITIVE — AB

## 2020-07-01 LAB — ECHO TEE
AR max vel: 0.95 cm2
AV Area VTI: 0.87 cm2
AV Area mean vel: 0.8 cm2
AV Mean grad: 22 mmHg
AV Peak grad: 35.5 mmHg
Ao pk vel: 2.98 m/s

## 2020-07-01 LAB — GLUCOSE, CAPILLARY: Glucose-Capillary: 122 mg/dL — ABNORMAL HIGH (ref 70–99)

## 2020-07-01 SURGERY — ECHOCARDIOGRAM, TRANSESOPHAGEAL
Anesthesia: General

## 2020-07-01 MED ORDER — TRANEXAMIC ACID (OHS) PUMP PRIME SOLUTION
2.0000 mg/kg | INTRAVENOUS | Status: DC
  Filled 2020-07-01 (×2): qty 1.96

## 2020-07-01 MED ORDER — TRANEXAMIC ACID 1000 MG/10ML IV SOLN
1.5000 mg/kg/h | INTRAVENOUS | Status: DC
  Filled 2020-07-01 (×2): qty 25

## 2020-07-01 MED ORDER — SODIUM CHLORIDE 0.9 % IV SOLN
INTRAVENOUS | Status: DC
  Filled 2020-07-01 (×2): qty 30

## 2020-07-01 MED ORDER — SODIUM CHLORIDE 0.9 % IV SOLN: INTRAVENOUS | Status: DC

## 2020-07-01 MED ORDER — TRANEXAMIC ACID (OHS) BOLUS VIA INFUSION
15.0000 mg/kg | INTRAVENOUS | Status: DC
  Filled 2020-07-01 (×2): qty 1473

## 2020-07-01 MED ORDER — INSULIN REGULAR(HUMAN) IN NACL 100-0.9 UT/100ML-% IV SOLN
INTRAVENOUS | Status: DC
  Filled 2020-07-01 (×2): qty 100

## 2020-07-01 MED ORDER — SODIUM CHLORIDE 0.9 % IV SOLN
750.0000 mg | INTRAVENOUS | Status: DC
  Filled 2020-07-01 (×2): qty 750

## 2020-07-01 MED ORDER — DEXMEDETOMIDINE HCL IN NACL 400 MCG/100ML IV SOLN
0.1000 ug/kg/h | INTRAVENOUS | Status: DC
  Filled 2020-07-01 (×2): qty 100

## 2020-07-01 MED ORDER — NOREPINEPHRINE 4 MG/250ML-% IV SOLN
0.0000 ug/min | INTRAVENOUS | Status: DC
  Filled 2020-07-01 (×2): qty 250

## 2020-07-01 MED ORDER — HEPARIN SODIUM (PORCINE) 1000 UNIT/ML DIALYSIS: 3000.0000 [IU] | Freq: Once | INTRAMUSCULAR | Status: DC

## 2020-07-01 MED ORDER — NITROGLYCERIN IN D5W 200-5 MCG/ML-% IV SOLN
2.0000 ug/min | INTRAVENOUS | Status: DC
  Filled 2020-07-01 (×2): qty 250

## 2020-07-01 MED ORDER — PLASMA-LYTE 148 IV SOLN
INTRAVENOUS | Status: DC
  Filled 2020-07-01 (×2): qty 2.5

## 2020-07-01 MED ORDER — LIDOCAINE HCL (PF) 1 % IJ SOLN: 20.0000 mL | INTRAMUSCULAR | Status: DC | PRN

## 2020-07-01 MED ORDER — MAGNESIUM SULFATE 50 % IJ SOLN
40.0000 meq | INTRAMUSCULAR | Status: DC
  Filled 2020-07-01 (×2): qty 9.85

## 2020-07-01 MED ORDER — PROPOFOL 500 MG/50ML IV EMUL
INTRAVENOUS | Status: DC | PRN
  Administered 2020-07-01: 100 ug/kg/min via INTRAVENOUS

## 2020-07-01 MED ORDER — CHLORHEXIDINE GLUCONATE CLOTH 2 % EX PADS
6.0000 | MEDICATED_PAD | Freq: Every day | CUTANEOUS | Status: DC
  Administered 2020-07-02: 6 via TOPICAL

## 2020-07-01 MED ORDER — VANCOMYCIN HCL 1500 MG/300ML IV SOLN
1500.0000 mg | INTRAVENOUS | Status: DC
  Filled 2020-07-01 (×2): qty 300

## 2020-07-01 MED ORDER — PROPOFOL 10 MG/ML IV BOLUS
INTRAVENOUS | Status: DC | PRN
  Administered 2020-07-01: 40 mg via INTRAVENOUS

## 2020-07-01 MED ORDER — LIDOCAINE 2% (20 MG/ML) 5 ML SYRINGE
INTRAMUSCULAR | Status: DC | PRN
  Administered 2020-07-01: 100 mg via INTRAVENOUS

## 2020-07-01 MED ORDER — MILRINONE LACTATE IN DEXTROSE 20-5 MG/100ML-% IV SOLN
0.3000 ug/kg/min | INTRAVENOUS | Status: DC
  Filled 2020-07-01 (×2): qty 100

## 2020-07-01 MED ORDER — SODIUM CHLORIDE 0.9 % IV SOLN
1.5000 g | INTRAVENOUS | Status: DC
  Filled 2020-07-01 (×2): qty 1.5

## 2020-07-01 MED ORDER — POTASSIUM CHLORIDE 2 MEQ/ML IV SOLN
80.0000 meq | INTRAVENOUS | Status: DC
  Filled 2020-07-01 (×2): qty 40

## 2020-07-01 MED ORDER — PHENYLEPHRINE HCL-NACL 20-0.9 MG/250ML-% IV SOLN
30.0000 ug/min | INTRAVENOUS | Status: DC
  Filled 2020-07-01 (×2): qty 250

## 2020-07-01 MED ORDER — MUPIROCIN 2 % EX OINT
1.0000 "application " | TOPICAL_OINTMENT | Freq: Two times a day (BID) | CUTANEOUS | Status: DC
  Administered 2020-07-01 – 2020-07-03 (×4): 1 via NASAL
  Filled 2020-07-01 (×2): qty 22

## 2020-07-01 MED ORDER — EPINEPHRINE HCL 5 MG/250ML IV SOLN IN NS
0.0000 ug/min | INTRAVENOUS | Status: DC
  Filled 2020-07-01 (×2): qty 250

## 2020-07-01 MED ORDER — BUTAMBEN-TETRACAINE-BENZOCAINE 2-2-14 % EX AERO
INHALATION_SPRAY | CUTANEOUS | Status: DC | PRN
  Administered 2020-07-01: 2 via TOPICAL

## 2020-07-01 MED ORDER — EPHEDRINE SULFATE 50 MG/ML IJ SOLN
INTRAMUSCULAR | Status: DC | PRN
  Administered 2020-07-01 (×3): 5 mg via INTRAVENOUS

## 2020-07-01 NOTE — Anesthesia Procedure Notes (Signed)
Procedure Name: MAC Date/Time: 07/01/2020 2:16 PM Performed by: Amadeo Garnet, CRNA Pre-anesthesia Checklist: Patient identified, Emergency Drugs available, Suction available and Patient being monitored Patient Re-evaluated:Patient Re-evaluated prior to induction Oxygen Delivery Method: Circle system utilized Preoxygenation: Pre-oxygenation with 100% oxygen Induction Type: IV induction Placement Confirmation: positive ETCO2 Dental Injury: Teeth and Oropharynx as per pre-operative assessment

## 2020-07-01 NOTE — Anesthesia Preprocedure Evaluation (Signed)
Anesthesia Evaluation  Patient identified by MRN, date of birth, ID band Patient awake    Reviewed: Allergy & Precautions, H&P , NPO status , Patient's Chart, lab work & pertinent test results  Airway Mallampati: II   Neck ROM: full    Dental   Pulmonary Current Smoker and Patient abstained from smoking.,    breath sounds clear to auscultation       Cardiovascular hypertension, + CAD  + dysrhythmias Atrial Fibrillation  Rhythm:regular Rate:Normal     Neuro/Psych  Neuromuscular disease    GI/Hepatic   Endo/Other    Renal/GU ESRF and DialysisRenal disease     Musculoskeletal  (+) Arthritis ,   Abdominal   Peds  Hematology   Anesthesia Other Findings   Reproductive/Obstetrics                             Anesthesia Physical Anesthesia Plan  ASA: III  Anesthesia Plan: General   Post-op Pain Management:    Induction: Intravenous  PONV Risk Score and Plan: 1 and Propofol infusion and Treatment may vary due to age or medical condition  Airway Management Planned: Mask  Additional Equipment:   Intra-op Plan:   Post-operative Plan:   Informed Consent: I have reviewed the patients History and Physical, chart, labs and discussed the procedure including the risks, benefits and alternatives for the proposed anesthesia with the patient or authorized representative who has indicated his/her understanding and acceptance.       Plan Discussed with: CRNA, Anesthesiologist and Surgeon  Anesthesia Plan Comments:         Anesthesia Quick Evaluation

## 2020-07-01 NOTE — Procedures (Signed)
06/27/2020 - 07/01/2020  2:55 PM  PATIENT:  Marco Arnold  54 y.o. male  PRE-OPERATIVE DIAGNOSIS:  Mitral stenosis, aortic stenosis  POST-OPERATIVE DIAGNOSIS:   Mod to severe Aortic stenosis   PROCEDURE:  Procedure(s): TRANSESOPHAGEAL ECHOCARDIOGRAM (TEE)  Provider:  Saverio Danker, PA-C  ANESTHESIA:   local   DRAINS: none   LOCAL MEDICATIONS USED:  LIDOCAINE   SPECIMEN:  Source of Specimen:  2 punch biopsies  DISPOSITION OF SPECIMEN:  pathology and microbiology  INDICATION FOR PROCEDURE: This is a 54 yo male with a history of chronic skin lesions of unclear etiology.  He is scheduled tentatively for cardiac surgery and a request for punch biopsy was requested to rule out systemic infection prior to surgical intervention.  PROCEDURE: the patient was under conscious sedation for concomitant TEE being performed by cardiology.  The area was prepped in the usual sterile fashion with chloraprep.  2% lidocaine was then used to anesthetize his left medial distal thigh.  A 31mm punch was used to take the first biopsy and a second to take the second biopsy careful to get affected lesion and normal skin.  One specimen was placed in saline and one in formalin.  Hemostasis was achieved and dressing placed.  PATIENT DISPOSITION:  Return to ward in stable condition   Marco Arnold 3:03 PM 07/01/2020

## 2020-07-01 NOTE — Progress Notes (Signed)
CARDIAC REHAB PHASE I   Went to complete preop education with pt. Pt out of room for another test. Left cardiac surgery and in-the-tube sheet at bedside. Will continue to follow pt throughout his hospital stay.  Rufina Falco, RN BSN 07/01/2020 1:50 PM

## 2020-07-01 NOTE — H&P (View-Only) (Signed)
Patient ID: Marco Arnold, male   DOB: 28-Jun-1966, 54 y.o.   MRN: [id number]     Advanced Heart Failure Rounding Note  PCP-Cardiologist: No primary care provider on file.   Subjective:    Patient seen at HD this morning.  No complaints, no chest pain or dyspnea.     Objective:   Weight Range: 101.2 kg Body mass index is 27.52 kg/m.   Vital Signs:   Temp:  [97.5 F (36.4 C)-97.9 F (36.6 C)] 97.8 F (36.6 C) (11/04 0750) Pulse Rate:  [68-69] 69 (11/04 0628) Resp:  [12-22] 20 (11/04 0900) BP: (122-163)/(76-128) 144/89 (11/04 0900) SpO2:  [90 %-99 %] 98 % (11/04 0750) Weight:  [99.7 kg-101.2 kg] 101.2 kg (11/04 0750) Last BM Date: 06/29/20  Weight change: Filed Weights   06/30/20 1240 07/01/20 0248 07/01/20 0750  Weight: 99.7 kg 100.9 kg 101.2 kg    Intake/Output:   Intake/Output Summary (Last 24 hours) at 07/01/2020 0907 Last data filed at 07/01/2020 0609 Gross per 24 hour  Intake 802.7 ml  Output 2500 ml  Net -1697.3 ml      Physical Exam    General:  Well appearing. No resp difficulty HEENT: Normal Neck: Supple. JVP 8-9 cm. Carotids 2+ bilat; no bruits. No lymphadenopathy or thyromegaly appreciated. Cor: PMI nondisplaced. Regular rate & rhythm. No rubs, gallops or murmurs. Lungs: Clear Abdomen: Soft, nontender, nondistended. No hepatosplenomegaly. No bruits or masses. Good bowel sounds. Extremities: No cyanosis, clubbing, rash. 1+ ankle edema.  Neuro: Alert & orientedx3, cranial nerves grossly intact. moves all 4 extremities w/o difficulty. Affect pleasant   Telemetry   NSR 80s (personally reviewed)   Labs    CBC Recent Labs    06/30/20 0226 07/01/20 0308  WBC 11.1* 9.4  NEUTROABS  --  6.6  HGB 13.9 14.2  HCT 43.4 44.0  MCV 85.8 87.1  PLT 149* 867*   Basic Metabolic Panel Recent Labs    06/29/20 0653 06/29/20 0653 06/30/20 0226 06/30/20 0226 06/30/20 0659 07/01/20 0308  NA 133*   < > 130*  --   --  132*  K 4.3   < > 6.6*   < > 4.2  4.2  CL 90*   < > 92*  --   --  93*  CO2 21*   < > 18*  --   --  24  GLUCOSE 139*   < > 122*  --   --  112*  BUN 63*   < > 80*  --   --  47*  CREATININE 7.49*   < > 8.78*  --   --  7.06*  CALCIUM 8.8*   < > 8.4*  --   --  8.8*  PHOS 10.9*  --   --   --   --   --    < > = values in this interval not displayed.   Liver Function Tests Recent Labs    06/29/20 0653  ALBUMIN 3.2*   No results for input(s): LIPASE, AMYLASE in the last 72 hours. Cardiac Enzymes No results for input(s): CKTOTAL, CKMB, CKMBINDEX, TROPONINI in the last 72 hours.  BNP: BNP (last 3 results) No results for input(s): BNP in the last 8760 hours.  ProBNP (last 3 results) No results for input(s): PROBNP in the last 8760 hours.   D-Dimer No results for input(s): DDIMER in the last 72 hours. Hemoglobin A1C Recent Labs    06/30/20 0226  HGBA1C 6.1*   Fasting Lipid Panel  No results for input(s): CHOL, HDL, LDLCALC, TRIG, CHOLHDL, LDLDIRECT in the last 72 hours. Thyroid Function Tests No results for input(s): TSH, T4TOTAL, T3FREE, THYROIDAB in the last 72 hours.  Invalid input(s): FREET3  Other results:   Imaging    CT chest without contrast  Result Date: 06/30/2020 CLINICAL DATA:  Thoracic aortic disease, preoperative planning EXAM: CT CHEST WITHOUT CONTRAST TECHNIQUE: Multidetector CT imaging of the chest was performed following the standard protocol without IV contrast. COMPARISON:  07/09/2018 FINDINGS: Cardiovascular: Densely calcified coronary arteries. Moderate aortic calcifications. No aneurysm. Maximum aortic diameter 3.6 cm in the ascending thoracic aorta. Densely calcified mitral valve annulus. Mediastinum/Nodes: No mediastinal, hilar, or axillary adenopathy. Trachea and esophagus are unremarkable. Thyroid unremarkable. Lungs/Pleura: Lungs are clear. No focal airspace opacities or suspicious nodules. No effusions. Upper Abdomen: Imaging into the upper abdomen demonstrates no acute findings.  Severely atrophic kidneys bilaterally. Musculoskeletal: Chest wall soft tissues are unremarkable. No acute bony abnormality. IMPRESSION: No evidence of aortic aneurysm. Heavily calcified coronary arteries and mitral valve annulus. Aortic Atherosclerosis (ICD10-I70.0). Electronically Signed   By: Rolm Baptise M.D.   On: 06/30/2020 22:03      Medications:     Scheduled Medications: . acetaminophen  1,000 mg Oral Q8H  . aspirin  81 mg Oral Daily  . atorvastatin  80 mg Oral Daily  . carvedilol  3.125 mg Oral BID WC  . Chlorhexidine Gluconate Cloth  6 each Topical Q0600  . [START ON 07/02/2020] heparin  3,000 Units Dialysis Once in dialysis  . hydrALAZINE  25 mg Oral Q8H  . isosorbide mononitrate  30 mg Oral Daily  . lanthanum  4,000 mg Oral TID WC  . lidocaine  1 patch Transdermal Q24H  . multivitamin  1 tablet Oral Q M,W,F  . pantoprazole  40 mg Oral Daily  . sodium chloride flush  3 mL Intravenous Q12H  . sodium chloride flush  3 mL Intravenous Q12H     Infusions: . sodium chloride    . heparin 1,700 Units/hr (07/01/20 0609)     PRN Medications:  sodium chloride, albuterol, cyclobenzaprine, diclofenac Sodium, HYDROcodone-acetaminophen, hydrOXYzine, ondansetron (ZOFRAN) IV, polyethylene glycol, sodium chloride flush    Assessment/Plan   1. CAD: Patient with prior history of PCI to LAD in 11/20.  Says he had 2 stents prior to this as well.  Presentation seems more likely CHF exacerbation.  HS-TnI was mildly elevated (peak 302) without a trend, seems more likely to be demand ischemia due to volume overload than actual ACS.  ECG unchanged (RBBB-like IVCD).  LHC showed a tight eccentric/calcified 80% distal left main stenosis.  He has not had viability study but does not have thinning/scarring and has some thickening of all walls, so suspect significant viable tissue.  - Discussed with Dr. Orvan Seen, plan for high-risk CABG on Friday.   - Continue ASA and high dose statin.  - He is on  heparin gtt (see below re: fall in platelets).  2. Acute on chronic systolic CHF: Echo in past (11/20) with EF 35-40% range, suspected ischemic cardiomyopathy.  Echo now with EF down to 25% on my review with mild-moderate RV dysfunction.  Cardiac index on RHC 2.3 by Fick and 2.1 by thermodilution.  On exam, he remains mildly volume overloaded but getting HD again this morning.  BP generally runs high.  - Volume control via HD, needs fluid removed today.  - Continue low dose Coreg 3.125 mg bid.  - Would avoid clonidine with cardiomyopathy.  Also  may not tolerate ACEI/ARB/ARNI as it appears that he has a tendency towards periodic hyperkalemia despite HD (can discuss with renal however).  For now, I have him on hydralazine 25 mg tid and Imdur 30 mg daily to replace clonidine.  - CABG will be high risk due to cardiomyopathy.  Suspect he will need Impella 5.5 peri-operatively.  - Long-term, if EF does not improve will need to consider ICD.  With wide IVCD, he may be CRT candidate (though looks more RBBB-like).  3. Valvular heart disease: Echo with AoV mean gradient 15 mmHg but calculated AVA 0.7 cm^2.  No significant pullback gradient across the valve on cath. Suspect moderate aortic stenosis.  There is also moderate-severe TR and moderate MR.  - Will get TEE today for better look at the valves prior to surgery to help guide valve repair/replacement needs.  Discussed risks/benefits with patient and he agrees to procedure.  4. ESRD: Since 2013, patients states it was started due to HTN.  Has not been transplant candidate due to his cardiac disease.  5. HTN: Med changes as above.  Would like to avoid clonidine.  6. Thrombocytopenia: Platelets dropped from 202 at admission to 123 today.  He is on heparin gtt. Discussed with pharmacy, think drop too quick for HIT but will need to follow closely.  - As I do not think he had ACS as presentation (suspect CHF), will stop heparin gtt today but will not start  bivalirudin as he is going to surgery tomorrow.  - Send HIT.  7. Active smoker: Needs to quit.  Has nicotine patch.  8. Skin lesions: He says that these small ulcerations have been present for months.  He has had antibiotic courses through ID in [facility] it sounds like.  Last blood cultures from his HD clinic were negative per patient.  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   - Will get ID to see him here prior to surgery.    Length of Stay: 3  Loralie Champagne, MD  07/01/2020, 9:07 AM  Advanced Heart Failure Team Pager [phone] (M-F; 7a - 4p)  Please contact [facility] for night-coverage after hours (4p -7a ) and weekends on [email or web address]

## 2020-07-01 NOTE — Progress Notes (Addendum)
[facility] for Heparin Indication: chest pain/ACS  Allergies  Allergen Reactions  . Lisinopril Shortness Of Breath    Unable to breath    Patient Measurements: Height: 6' 3.5" (191.8 cm) Weight: 100.9 kg (222 lb 8 oz) IBW/kg (Calculated) : 85.65 Heparin Dosing Weight: 104kg  Vital Signs: Temp: 97.6 F (36.4 C) (11/04 0628) Temp Source: Oral (11/04 0628) BP: 132/88 (11/04 0628) Pulse Rate: 69 (11/04 0628)  Labs: Recent Labs    06/29/20 0653 06/29/20 1804 06/30/20 0226 06/30/20 1000 07/01/20 0308  HGB 14.9  --  13.9  --  14.2  HCT 46.4  --  43.4  --  44.0  PLT 202  --  149*  --  123*  HEPARINUNFRC 0.11*   < > 0.42 0.67 0.50  CREATININE 7.49*  --  8.78*  --  7.06*   < > = values in this interval not displayed.    Estimated Creatinine Clearance: 14.5 mL/min (A) (by C-G formula based on SCr of 7.06 mg/dL (H)).  Assessment: 54 yo M with ESRD and hx LAD stent 06/2019 with chest pain. No AC PTA. Pharmacy consulted for heparin.   Cath on 11/1 with 80% ost-distal LM lesion, in-stent restenosis, hemodynamic findings consistent with aortic valve stenosis, TEE today and CABG planned for 11/5.  Today's heparin level is therapeutic at 0.50.  Platelets continue to trend down, 30-50% decrease from baseline over ~4 days with prior heparin exposure, 4T score 3-4 with low to moderate probability of HIT.  Per Dr. Orvan Seen, ordered HIT assay. Deferred stopping heparin to heart failure team. Per Dr. Aundra Dubin, stop heparin given no other indications besides ACS pending CABG, no DTI for now.   Called hemodialysis, per RN will stop heparin infusion, no other heparin planned for dialysis session today. Requested RN draw HIT assay given still in dialysis.  Once heparin antibody drawn, heart failure team plans to call and try to get result today given CABG tomorrow.  Hemoglobin wnl and stable. No signs or symptoms of bleeding reported.  Goal of Therapy:   Heparin level 0.3-0.7 units/ml Monitor platelets by anticoagulation protocol: Yes   Plan:  Stop IV heparin HIT assay ordered STAT and avoid heparin products until resulted (added to allergies) Daily CBC Monitor s/sx bleeding  Fara Olden, PharmD PGY-1 Pharmacy Resident 07/01/2020 7:28 AM Please see AMION for all pharmacy numbers

## 2020-07-01 NOTE — Consult Note (Addendum)
Marco Arnold June 30, 1966  [id number].    Requesting MD: Dr. Scharlene Gloss Chief Complaint/Reason for Consult: punch biopsy for skin lesions  HPI:  This is a 54 yo white male with multiple medical problems here for cardiac disease and awaiting possible cardiac surgery.  They patient states he had a "blood stream infection" about three weeks ago and he thinks these lesions are from that, but they were present before he knew about this infection.  In review of the chart, it appears he saw ID about 3 weeks ago and was treated with vancomycin and started CHG bathes as an anti-MRSA treatment.  He states that they form pustules and then rupture and scab over.  Prior to cardiac surgery, it was requested these be identified to rule out infectious source that could seed a new valve, etc.    ROS: ROS: Please see HPI, otherwise currently negative except for pain in his feet.  Family History  Problem Relation Age of Onset  . Pancreatic cancer Mother   . Diabetes Father   . Hypertension Father   . Cancer - Lung Paternal Grandfather     Past Medical History:  Diagnosis Date  . Arthritis    spine  . Chronic kidney disease    [place] MWF  . Coronary artery disease   . Dialysis patient [facility]) 2013  . Diverticulitis   . Hyperlipidemia   . Hypertension    dr  Tobie Lords      @ [facility]  med  . Neuromuscular disorder ([place])    nerve pain - tx w/cymbalta  . Neuropathic pain 09/20/2017   Left mid-thoracic    Past Surgical History:  Procedure Laterality Date  . AV FISTULA PLACEMENT  11/10/2011   Procedure: ARTERIOVENOUS (AV) FISTULA CREATION;  Surgeon: Mal Misty, MD;  Location: [place];  Service: Vascular;  Laterality: Left;  Ultrasound guided  . CARDIAC CATHETERIZATION  09/19/2018   On stent study at [facility]  . COLONOSCOPY    . INSERTION OF DIALYSIS CATHETER  02/09/2012   Procedure: INSERTION OF DIALYSIS CATHETER;  Surgeon: Mal Misty, MD;  Location: [place];  Service: Vascular;   Laterality: N/A;  . MASS EXCISION Right 09/07/2018   Procedure: EXCISION MASS RIGHT THUMB;  Surgeon: Charlotte Crumb, MD;  Location: [place];  Service: Orthopedics;  Laterality: Right;  OR AXILLARY BLOCK  . REVISON OF ARTERIOVENOUS FISTULA Left 04/08/2019   Procedure: REVISION PLICATION OF LEFT ARM ARTERIOVENOUS FISTULA;  Surgeon: Serafina Mitchell, MD;  Location: [place];  Service: Vascular;  Laterality: Left;  . RIGHT/LEFT HEART CATH AND CORONARY ANGIOGRAPHY N/A 06/28/2020   Procedure: RIGHT/LEFT HEART CATH AND CORONARY ANGIOGRAPHY;  Surgeon: Lorretta Harp, MD;  Location: [place] CV LAB;  Service: Cardiovascular;  Laterality: N/A;  . SHUNTOGRAM Left 06/03/2012   Procedure: Earney Mallet;  Surgeon: Conrad Holualoa, MD;  Location: [facility] CATH LAB;  Service: Cardiovascular;  Laterality: Left;  . SHUNTOGRAM Left 01/02/2013   Procedure: SHUNTOGRAM;  Surgeon: Conrad [place], MD;  Location: [facility] CATH LAB;  Service: Cardiovascular;  Laterality: Left;    Social History:  reports that he has been smoking cigarettes. He has a 10.75 pack-year smoking history. He has never used smokeless tobacco. He reports current alcohol use. He reports current drug use. Drug: Marijuana.  Allergies:  Allergies  Allergen Reactions  . Lisinopril Shortness Of Breath    Unable to breath  . Heparin Other (See Comments)    11/4 HIT panel pending  Facility-Administered Medications Prior to Admission  Medication Dose Route Frequency Provider Last Rate Last Admin  . chlorhexidine (HIBICLENS) 4 % liquid   Topical Daily Manandhar, Collene Mares, MD      . terbinafine (LAMISIL) 1 % cream   Topical Daily Rosiland Oz, MD       Medications Prior to Admission  Medication Sig Dispense Refill  . acetaminophen (TYLENOL 8 HOUR) 650 MG CR tablet Take 1 tablet (650 mg total) by mouth every 8 (eight) hours as needed for pain. 15 tablet 0  . aspirin EC 81 MG tablet Take 81 mg by mouth daily.    . cloNIDine (CATAPRES) 0.1 MG tablet Take 0.2 mg by  mouth daily.    . cyclobenzaprine (FLEXERIL) 10 MG tablet Take 20 mg by mouth 2 (two) times daily as needed for muscle spasms.     Marland Kitchen FOSRENOL 1000 MG chewable tablet Chew 6,000 mg by mouth 3 (three) times daily with meals.   11  . hydrOXYzine (ATARAX/VISTARIL) 25 MG tablet Take 25 mg by mouth 2 (two) times daily as needed for anxiety or itching (Sleep).     Marland Kitchen lidocaine (LIDODERM) 5 % Place 1 patch onto the skin daily as needed. Apply patch to area most significant pain once per day.  Remove and discard patch within 12 hours of application. (Patient taking differently: Place 1 patch onto the skin daily as needed (Apply patch to area most significant pain once per day.  Remove and discard patch within 12 hours of application.). ) 30 patch 0  . MISC NATURAL PRODUCTS EX Apply topically. Patient applies horse liniments as needed for chronic low back pain (contains menthol)    . multivitamin (RENA-VIT) TABS tablet Take 1 tablet by mouth See admin instructions. Every Monday, Wednesday and Friday    . naproxen sodium (ALEVE) 220 MG tablet Take 220 mg by mouth 2 (two) times daily as needed (pain).     . pantoprazole (PROTONIX) 40 MG tablet Take 40 mg by mouth daily.     . VENTOLIN HFA 108 (90 Base) MCG/ACT inhaler Inhale 2 puffs into the lungs every 6 (six) hours as needed for wheezing or shortness of breath.     Marland Kitchen amLODipine (NORVASC) 10 MG tablet Take 10 mg by mouth at bedtime.  (Patient not taking: Reported on 06/15/2020)    . atorvastatin (LIPITOR) 80 MG tablet Take 80 mg by mouth daily.  (Patient not taking: Reported on 06/15/2020)    . carvedilol (COREG) 3.125 MG tablet Take 3.125 mg by mouth 2 (two) times daily with a meal.  (Patient not taking: Reported on 06/15/2020)    . DULoxetine (CYMBALTA) 60 MG capsule Take 60 mg by mouth every morning.  (Patient not taking: Reported on 06/15/2020)  2  . gabapentin (NEURONTIN) 100 MG capsule Take 3 capsules (300 mg total) by mouth at bedtime. (Patient not taking:  Reported on 06/15/2020) 60 capsule 0  . HYDROcodone-acetaminophen (NORCO) 5-325 MG tablet Take 1 tablet by mouth every 6 (six) hours as needed for moderate pain. (Patient not taking: Reported on 06/15/2020) 15 tablet 0  . nortriptyline (PAMELOR) 25 MG capsule Take 25 mg by mouth every morning.  (Patient not taking: Reported on 06/15/2020)  2     Physical Exam: Blood pressure (!) 147/97, pulse 74, temperature 97.9 F (36.6 C), temperature source Temporal, resp. rate 18, height 6' 3.5" (1.918 m), weight 98.2 kg, SpO2 100 %. Gen: WD WN white male who is sitting up in his bed in  NAD HEENT: normocephalic, atraumatic, with severe scars from prior lesions, PERRL, ears and nose without any masses.  Mouth is pink.  He has several healing lesions on his forehead and face Abd: soft, NT, ND, +BS, no masses, hernias Skin: multiple lesions all over his legs and arms.  Not many on his trunk/back.  These are scabbed over in almost every location. Psych: A&Ox3 with appropriate affect  Results for orders placed or performed during the hospital encounter of 06/27/20 (from the past 48 hour(s))  Heparin level (unfractionated)     Status: Abnormal   Collection Time: 06/29/20  6:04 PM  Result Value Ref Range   Heparin Unfractionated 0.25 (L) 0.30 - 0.70 IU/mL    Comment: (NOTE) If heparin results are below expected values, and patient dosage has  been confirmed, suggest follow up testing of antithrombin III levels. Performed at [facility], [place] [address] [place], [place] [address]   CBC     Status: Abnormal   Collection Time: 06/30/20  2:26 AM  Result Value Ref Range   WBC 11.1 (H) 4.0 - 10.5 K/uL   RBC 5.06 4.22 - 5.81 MIL/uL   Hemoglobin 13.9 13.0 - 17.0 g/dL   HCT 43.4 39 - 52 %   MCV 85.8 80.0 - 100.0 fL   MCH 27.5 26.0 - 34.0 pg   MCHC 32.0 30.0 - 36.0 g/dL   RDW 16.6 (H) 11.5 - 15.5 %   Platelets 149 (L) 150 - 400 K/uL   nRBC 0.0 0.0 - 0.2 %    Comment: Performed at [facility], [place] [address], [place], [place] [address]  Basic metabolic panel     Status: Abnormal   Collection Time: 06/30/20  2:26 AM  Result Value Ref Range   Sodium 130 (L) 135 - 145 mmol/L   Potassium 6.6 (HH) 3.5 - 5.1 mmol/L    Comment: SLIGHT HEMOLYSIS CRITICAL RESULT CALLED TO, READ BACK BY AND VERIFIED WITH: E DODO,RN 06/30/2020 0338 WILDERK    Chloride 92 (L) 98 - 111 mmol/L   CO2 18 (L) 22 - 32 mmol/L   Glucose, Bld 122 (H) 70 - 99 mg/dL    Comment: Glucose reference range applies only to samples taken after fasting for at least 8 hours.   BUN 80 (H) 6 - 20 mg/dL   Creatinine, Ser 8.78 (H) 0.61 - 1.24 mg/dL   Calcium 8.4 (L) 8.9 - 10.3 mg/dL   GFR, Estimated 7 (L) >60 mL/min    Comment: (NOTE) Calculated using the CKD-EPI Creatinine Equation (2021)    Anion gap 20 (H) 5 - 15    Comment: Performed at [facility] [address] [place], [place] [address]  Hemoglobin A1c     Status: Abnormal   Collection Time: 06/30/20  2:26 AM  Result Value Ref Range   Hgb A1c MFr Bld 6.1 (H) 4.8 - 5.6 %    Comment: (NOTE) Pre diabetes:          5.7%-6.4%  Diabetes:              >6.4%  Glycemic control for   <7.0% adults with diabetes    Mean Plasma Glucose 128.37 mg/dL    Comment: Performed at [facility] [address], [place], [place] [address]  Heparin level (unfractionated)     Status: None   Collection Time: 06/30/20  2:26 AM  Result Value Ref Range   Heparin Unfractionated 0.42 0.30 -  0.70 IU/mL    Comment: (NOTE) If heparin results are below expected values, and patient dosage has  been confirmed, suggest follow up testing of antithrombin III levels. Performed at [facility], [place] [address] [place], [place] [address]   Glucose, capillary     Status: Abnormal   Collection Time: 06/30/20  6:16 AM  Result Value Ref Range   Glucose-Capillary 58 (L) 70 - 99 mg/dL    Comment: Glucose reference range applies only to samples taken after fasting for at least 8  hours.   Comment 1 Notify RN    Comment 2 Document in Chart   Glucose, capillary     Status: Abnormal   Collection Time: 06/30/20  6:24 AM  Result Value Ref Range   Glucose-Capillary 48 (L) 70 - 99 mg/dL    Comment: Glucose reference range applies only to samples taken after fasting for at least 8 hours.   Comment 1 Notify RN    Comment 2 Document in Chart   Glucose, capillary     Status: Abnormal   Collection Time: 06/30/20  6:29 AM  Result Value Ref Range   Glucose-Capillary 273 (H) 70 - 99 mg/dL    Comment: Glucose reference range applies only to samples taken after fasting for at least 8 hours.   Comment 1 Notify RN    Comment 2 Document in Chart   Potassium     Status: None   Collection Time: 06/30/20  6:59 AM  Result Value Ref Range   Potassium 4.2 3.5 - 5.1 mmol/L    Comment: Performed at [facility], [place] [address], [place], [place] [address]  Glucose, capillary     Status: None   Collection Time: 06/30/20  7:15 AM  Result Value Ref Range   Glucose-Capillary 97 70 - 99 mg/dL    Comment: Glucose reference range applies only to samples taken after fasting for at least 8 hours.  Heparin level (unfractionated)     Status: None   Collection Time: 06/30/20 10:00 AM  Result Value Ref Range   Heparin Unfractionated 0.67 0.30 - 0.70 IU/mL    Comment: (NOTE) If heparin results are below expected values, and patient dosage has  been confirmed, suggest follow up testing of antithrombin III levels. Performed at [facility], [place] [address], [place], [place] [address]   Heparin level (unfractionated)     Status: None   Collection Time: 07/01/20  3:08 AM  Result Value Ref Range   Heparin Unfractionated 0.50 0.30 - 0.70 IU/mL    Comment: (NOTE) If heparin results are below expected values, and patient dosage has  been confirmed, suggest follow up testing of antithrombin III levels. Performed at [facility], [place] [address], [place], [place] [address]   CBC  with Differential/Platelet     Status: Abnormal   Collection Time: 07/01/20  3:08 AM  Result Value Ref Range   WBC 9.4 4.0 - 10.5 K/uL   RBC 5.05 4.22 - 5.81 MIL/uL   Hemoglobin 14.2 13.0 - 17.0 g/dL   HCT 44.0 39 - 52 %   MCV 87.1 80.0 - 100.0 fL   MCH 28.1 26.0 - 34.0 pg   MCHC 32.3 30.0 - 36.0 g/dL   RDW 16.9 (H) 11.5 - 15.5 %   Platelets 123 (L) 150 - 400 K/uL   nRBC 0.0 0.0 - 0.2 %   Neutrophils Relative % 68 %   Neutro Abs 6.6 1.7 - 7.7  K/uL   Lymphocytes Relative 9 %   Lymphs Abs 0.8 0.7 - 4.0 K/uL   Monocytes Relative 10 %   Monocytes Absolute 0.9 0.1 - 1.0 K/uL   Eosinophils Relative 11 %   Eosinophils Absolute 1.0 (H) 0.0 - 0.5 K/uL   Basophils Relative 1 %   Basophils Absolute 0.1 0.0 - 0.1 K/uL   Immature Granulocytes 1 %   Abs Immature Granulocytes 0.06 0.00 - 0.07 K/uL    Comment: Performed at [facility] [address], [place], [place] [address]  Basic metabolic panel     Status: Abnormal   Collection Time: 07/01/20  3:08 AM  Result Value Ref Range   Sodium 132 (L) 135 - 145 mmol/L   Potassium 4.2 3.5 - 5.1 mmol/L   Chloride 93 (L) 98 - 111 mmol/L   CO2 24 22 - 32 mmol/L   Glucose, Bld 112 (H) 70 - 99 mg/dL    Comment: Glucose reference range applies only to samples taken after fasting for at least 8 hours.   BUN 47 (H) 6 - 20 mg/dL   Creatinine, Ser 7.06 (H) 0.61 - 1.24 mg/dL   Calcium 8.8 (L) 8.9 - 10.3 mg/dL   GFR, Estimated 9 (L) >60 mL/min    Comment: (NOTE) Calculated using the CKD-EPI Creatinine Equation (2021)    Anion gap 15 5 - 15    Comment: Performed at [facility] [address], [place], [place] [address]  Glucose, capillary     Status: Abnormal   Collection Time: 07/01/20  6:40 AM  Result Value Ref Range   Glucose-Capillary 122 (H) 70 - 99 mg/dL    Comment: Glucose reference range applies only to samples taken after fasting for at least 8 hours.   CT chest without contrast  Result Date: 06/30/2020 CLINICAL DATA:   Thoracic aortic disease, preoperative planning EXAM: CT CHEST WITHOUT CONTRAST TECHNIQUE: Multidetector CT imaging of the chest was performed following the standard protocol without IV contrast. COMPARISON:  07/09/2018 FINDINGS: Cardiovascular: Densely calcified coronary arteries. Moderate aortic calcifications. No aneurysm. Maximum aortic diameter 3.6 cm in the ascending thoracic aorta. Densely calcified mitral valve annulus. Mediastinum/Nodes: No mediastinal, hilar, or axillary adenopathy. Trachea and esophagus are unremarkable. Thyroid unremarkable. Lungs/Pleura: Lungs are clear. No focal airspace opacities or suspicious nodules. No effusions. Upper Abdomen: Imaging into the upper abdomen demonstrates no acute findings. Severely atrophic kidneys bilaterally. Musculoskeletal: Chest wall soft tissues are unremarkable. No acute bony abnormality. IMPRESSION: No evidence of aortic aneurysm. Heavily calcified coronary arteries and mitral valve annulus. Aortic Atherosclerosis (ICD10-I70.0). Electronically Signed   By: Rolm Baptise M.D.   On: 06/30/2020 22:03      Assessment/Plan MMP  Skin lesions The procedure has been discussed with the patient and consent has been obtained.  Will plan to proceed while under anesthesia after his TEE is complete.  ID has written orders already for what they would like these specimens sent for.  Patient is agreeable to proceed.  After procedure, we will sign off.  Henreitta Cea, [facility] Surgery 07/01/2020, 2:19 PM Please see Amion for pager number during day hours 7:00am-4:30pm or 7:00am -11:30am on weekends

## 2020-07-01 NOTE — Transfer of Care (Signed)
Immediate Anesthesia Transfer of Care Note  Patient: Marco Arnold  Procedure(s) Performed: TRANSESOPHAGEAL ECHOCARDIOGRAM (TEE) (N/A )  Patient Location: Endoscopy Unit  Anesthesia Type:MAC  Level of Consciousness: awake, alert  and oriented  Airway & Oxygen Therapy: Patient Spontanous Breathing and Patient connected to nasal cannula oxygen  Post-op Assessment: Report given to RN, Post -op Vital signs reviewed and stable and Patient moving all extremities X 4  Post vital signs: Reviewed and stable  Last Vitals:  Vitals Value Taken Time  BP    Temp    Pulse    Resp    SpO2      Last Pain:  Vitals:   07/01/20 1451  TempSrc: Temporal  PainSc: 0-No pain      Patients Stated Pain Goal: 5 (47/99/87 2158)  Complications: No complications documented.

## 2020-07-01 NOTE — CV Procedure (Signed)
Procedure: TEE  Indication: Valvular heart disease.  Sedation: Per anesthesiology  Findings: Please see echo section for full report.  Mildly dilated LV with mild LV hypertrophy.  EF 25-30%, diffuse hypokinesis.  Moderately dilated RV with moderately decreased systolic function.  Moderate left atrial enlargement, no LA appendage thrombus.  Moderate right atrial enlargement.  Small PFO noted by color doppler.  There was moderate tricuspid regurgitation, peak RV-RA gradient 29 mmHg.  There was moderate mitral regurgitation.  There was moderate to severe aortic stenosis with mean gradient 24 mmHg, AVA 0.84 cm^2 by continuity equation and 1.2 cm^2 by planimetry.  Normal caliber thoracic aorta with mild plaque.   Impression:  Moderate-severe aortic stenosis (may be more moderate based on ease of crossing at time of cath), moderate mitral regurgitation, moderate tricuspid regurgitation, small PFO.  Would recommend replacing aortic valve at time of CABG.   Loralie Champagne 07/01/2020 2:45 PM

## 2020-07-01 NOTE — Consult Note (Signed)
[facility]    Date of Admission:  06/27/2020      Total days of antibiotics 0          Reason for Consult: Chronic rash     Referring Provider: Julien Girt  Primary Care Provider: Cyndi Bender, PA-C    Assessment: Marco Arnold is a 54 y.o. dialysis dependent male here awaiting CABG, AVR, TVR and possibly MVR with chronic appearing skin lesions. He has had previous superficial wound cultures + for MRSA and multiple rounds of antibiotics (parenteral included) to treat the lesions with little improvement. On exam none of them appear grossly infected at this time but the cause of them is unclear. Differential is broad - fungal origin vs calciphylaxisis (although lack of pain seems to argue against) vs acquired perforating dermatosis (more commonly affecting chronic kidney patients).  Will ask general surgery to biopsy 1-2 newer lesions and send for pathology + fungal/AFB stains along with a sample to microbiology (routine bacterial cultures, fungal cultures and AFB cultures).   His feet are actually exquisitely painful. Unclear if that may be gout manifestation - will check serum uric acid; may be helpful to have orthopedics come see him.   Given unclear etiology, with potential infectious etiology included, would recommend to hold on valve surgery until we gain more information from pathology and cultures from skin biopsy.  Would hold antibiotics at this time.    Plan: 1. D/W General surgery for biopsy  2. Orders to be entered for pathology and micro cultures  3. Uric acid next lab draw 4. Would consider orthopedics to see him for bilateral foot pain.      Active Problems:   Chest pain   Shortness of breath   ESRD (end stage renal disease) on dialysis (HCC)   Prediabetes   . acetaminophen  1,000 mg Oral Q8H  . aspirin  81 mg Oral Daily  . atorvastatin  80 mg Oral Daily  . carvedilol  3.125 mg Oral BID WC  . Chlorhexidine Gluconate Cloth   6 each Topical Q0600  . hydrALAZINE  25 mg Oral Q8H  . isosorbide mononitrate  30 mg Oral Daily  . lanthanum  4,000 mg Oral TID WC  . lidocaine  1 patch Transdermal Q24H  . multivitamin  1 tablet Oral Q M,W,F  . pantoprazole  40 mg Oral Daily  . sodium chloride flush  3 mL Intravenous Q12H  . sodium chloride flush  3 mL Intravenous Q12H    HPI: Marco Arnold is a 54 y.o. male admitted 06/27/2020 with chest discomfort from home that started abruptly with associated lower extremity edema that has been present for a few weeks.   PHMx includes current smoker, ESRD (L FA AVF), CAD S/P PCI of LAD 11/20, HTN, AS, chronic low back pain.   Cardiac work up in hospital has revealed a severely depressed LVEF, severe aortic stenosis, tricuspid valve regurgitation and moderate mitral regurgitation. TCTS and advanced heart failure following - recommending CABG with AVR, TVR and possibly MVR with tentative plan for surgery Friday 11/05.   We were asked to see him with regards to his diffuse skin ulcers. He states that these have been present for months. He saw our partner Dr. Montel Culver about 3 weeks ago - he has had multiple rounds of anti-MRSA coverage including Vancomycin s/p HD sessions to treat these wounds. The left forearm specifically he recalls had ruptured purulent fluid but has  been "trying to dry up since." The scab keeps coming off during bathing (was using CHG and bleach baths). Widely present from face, chest/abdomen, extremities and toes.  No lesions are actively draining at the moment. He has not been febrile and no consistent leukocytosis.  Overall these lesions are not painful with exception to his toes - bilateral feet are very tender and swollen and red; this seems to have been a more recent development for him over the last 2 weeks. Painful to put socks on, let alone walk. He has also been having night sweats at home for the last month every night - this has improved since hospital admission.    Also states some burning pain with passing of "daily small amount of urine."    Review of Systems: Review of Systems  Constitutional: Negative for chills, fever, malaise/fatigue and weight loss.  Eyes: Negative for blurred vision and photophobia.  Respiratory: Positive for shortness of breath. Negative for cough and sputum production.   Cardiovascular: Negative for chest pain.  Gastrointestinal: Negative for abdominal pain, diarrhea, nausea and vomiting.  Genitourinary: Positive for dysuria.  Musculoskeletal: Positive for back pain (chronic) and joint pain (bilateral foot pain ).  Skin: Positive for rash.  Neurological: Negative for headaches.    Past Medical History:  Diagnosis Date  . Arthritis    spine  . Chronic kidney disease    [place] MWF  . Coronary artery disease   . Dialysis patient [facility]) 2013  . Diverticulitis   . Hyperlipidemia   . Hypertension    dr  Tobie Lords      @ [facility]  med  . Neuromuscular disorder ([place])    nerve pain - tx w/cymbalta  . Neuropathic pain 09/20/2017   Left mid-thoracic    Social History   Tobacco Use  . Smoking status: Light Tobacco Smoker    Packs/day: 0.25    Years: 43.00    Pack years: 10.75    Types: Cigarettes  . Smokeless tobacco: Never Used  . Tobacco comment: pt states that he is trying to quit--is trying to cut back at the current time  Vaping Use  . Vaping Use: Former  . Quit date: 08/28/2018  Substance Use Topics  . Alcohol use: Yes    Comment: occasional  . Drug use: Yes    Types: Marijuana    Family History  Problem Relation Age of Onset  . Pancreatic cancer Mother   . Diabetes Father   . Hypertension Father   . Cancer - Lung Paternal Grandfather    Allergies  Allergen Reactions  . Lisinopril Shortness Of Breath    Unable to breath  . Heparin Other (See Comments)    11/4 HIT panel pending    OBJECTIVE: Blood pressure (!) 146/93, pulse 69, temperature 97.8 F (36.6 C), temperature source Oral,  resp. rate (!) 22, height 6' 3.5" (1.918 m), weight 101.2 kg, SpO2 98 %.  Physical Exam Constitutional:      Appearance: He is ill-appearing.  Cardiovascular:     Rate and Rhythm: Normal rate and regular rhythm.     Heart sounds: Murmur heard.  Systolic murmur is present.   Pulmonary:     Effort: Pulmonary effort is normal. No tachypnea.     Breath sounds: Normal breath sounds.  Skin:    General: Skin is warm.     Findings: Rash present.     Comments: Diffuse ulcerated / scabbed appearing rash in various stages of healing noted on upper  and lower extremities, face and torso. The left forearm is the largest with some slight pinkness to the wound margin. The soles of both of his feet are erythematous, swollen and tender (apperas like a sun burn).   Neurological:     Mental Status: He is alert.     Lab Results Lab Results  Component Value Date   WBC 9.4 07/01/2020   HGB 14.2 07/01/2020   HCT 44.0 07/01/2020   MCV 87.1 07/01/2020   PLT 123 (L) 07/01/2020    Lab Results  Component Value Date   CREATININE 7.06 (H) 07/01/2020   BUN 47 (H) 07/01/2020   NA 132 (L) 07/01/2020   K 4.2 07/01/2020   CL 93 (L) 07/01/2020   CO2 24 07/01/2020    Lab Results  Component Value Date   ALT 27 03/23/2020   AST 31 03/23/2020   ALKPHOS 112 03/23/2020   BILITOT 1.1 03/23/2020     Microbiology: Recent Results (from the past 240 hour(s))  Respiratory Panel by RT PCR (Flu A&B, Covid) - Nasopharyngeal Swab     Status: None   Collection Time: 06/27/20  2:41 PM   Specimen: Nasopharyngeal Swab  Result Value Ref Range Status   SARS Coronavirus 2 by RT PCR NEGATIVE NEGATIVE Final    Comment: (NOTE) SARS-CoV-2 target nucleic acids are NOT DETECTED.  The SARS-CoV-2 RNA is generally detectable in upper respiratoy specimens during the acute phase of infection. The lowest concentration of SARS-CoV-2 viral copies this assay can detect is 131 copies/mL. A negative result does not preclude  SARS-Cov-2 infection and should not be used as the sole basis for treatment or other patient management decisions. A negative result may occur with  improper specimen collection/handling, submission of specimen other than nasopharyngeal swab, presence of viral mutation(s) within the areas targeted by this assay, and inadequate number of viral copies (<131 copies/mL). A negative result must be combined with clinical observations, patient history, and epidemiological information. The expected result is Negative.  Fact Sheet for Patients:  PinkCheek.be  Fact Sheet for Healthcare Providers:  GravelBags.it  This test is no t yet approved or cleared by the [place] FDA and  has been authorized for detection and/or diagnosis of SARS-CoV-2 by FDA under an Emergency Use Authorization (EUA). This EUA will remain  in effect (meaning this test can be used) for the duration of the COVID-19 declaration under Section 564(b)(1) of the Act, 21 U.S.C. section 360bbb-3(b)(1), unless the authorization is terminated or revoked sooner.     Influenza A by PCR NEGATIVE NEGATIVE Final   Influenza B by PCR NEGATIVE NEGATIVE Final    Comment: (NOTE) The Xpert Xpress SARS-CoV-2/FLU/RSV assay is intended as an aid in  the diagnosis of influenza from Nasopharyngeal swab specimens and  should not be used as a sole basis for treatment. Nasal washings and  aspirates are unacceptable for Xpert Xpress SARS-CoV-2/FLU/RSV  testing.  Fact Sheet for Patients: PinkCheek.be  Fact Sheet for Healthcare Providers: GravelBags.it  This test is not yet approved or cleared by the [place] FDA and  has been authorized for detection and/or diagnosis of SARS-CoV-2 by  FDA under an Emergency Use Authorization (EUA). This EUA will remain  in effect (meaning this test can be used) for the duration of the    Covid-19 declaration under Section 564(b)(1) of the Act, 21  U.S.C. section 360bbb-3(b)(1), unless the authorization is  terminated or revoked. Performed at [facility], [place] [address] [place], [place]  [place], MSN, NP-C [facility] [email or web address] Pager: [phone] Office: [place]: [phone]

## 2020-07-01 NOTE — Interval H&P Note (Signed)
History and Physical Interval Note:  07/01/2020 2:26 PM  Marco Arnold  has presented today for surgery, with the diagnosis of Mitral stenosis, aortic stenosis.  The various methods of treatment have been discussed with the patient and family. After consideration of risks, benefits and other options for treatment, the patient has consented to  Procedure(s): TRANSESOPHAGEAL ECHOCARDIOGRAM (TEE) (N/A) as a surgical intervention.  The patient's history has been reviewed, patient examined, no change in status, stable for surgery.  I have reviewed the patient's chart and labs.  Questions were answered to the patient's satisfaction.     [name] [employer]

## 2020-07-01 NOTE — Progress Notes (Signed)
  Echocardiogram Echocardiogram Transesophageal has been performed.  Marco Arnold 07/01/2020, 2:58 PM

## 2020-07-01 NOTE — Progress Notes (Signed)
Palliative:   Consult received and chart reviewed.   Attempted to see Zander a few times however he was having TEE, received sedation during procedure. Unable to proceed with [place] discussion. Will f/u.  Juel Burrow, DNP, AGNP-C Palliative Medicine Team Team Phone # [phone]  Pager # [phone]  NO CHARGE

## 2020-07-01 NOTE — Progress Notes (Signed)
[facility] Progress Note  Subjective: seen on HD, R foot painful  Vitals:   07/01/20 1100 07/01/20 1130 07/01/20 1200 07/01/20 1204  BP: (!) 156/89 (!) 146/93 (!) 147/86 138/88  Pulse:      Resp: (!) 22 (!) 22 12 15   Temp:    97.7 F (36.5 C)  TempSrc:    Axillary  SpO2:    98%  Weight:    98.2 kg  Height:        Exam:   alert, nad   no jvd  Chest cta bilat  Cor reg no RG  Abd soft ntnd no ascites obese   Ext mild pitting bilat ankle edema   Alert, NF, ox3   LUE AVF+bruit     OP HD: [place]  MWF   4.5h  450/ 2.0  99.5kg  2/2 bath  P2  15g  Hep 3000  LUE AVF   - max UF is 7kg during week and 9kg on mondays   Hectorol 7 mcg IVP q HD   Sensipar 60mg  PO q HD   Lanthanum 4 tabs PO q meal  Assessment/ Plan: 1. NSTEMI: Troponin elevated but flat. Echo showed worsening CHF with EF 25-30%. LHC showed severe L main lesion, patent old LAD stent.  TCTS planning CABG for Friday. For TEE today.  2.  ESRD:  On MWF schedule. HD today off schedule due to heart surgery tomorrow.   3.  Hypertension/volume: BP meds per cards, on imdur, hydralazine and coreg now. [facility]P was [facility] at [facility] at 23mm Hg.  4.  Anemia: Hgb >12, no ESA indicated.  5.  Metabolic bone disease: Phos >30 on labs today. Outpatient phos usually around 13. Phos yest was 10. Pt is not compliant with renal diet. Continue lanthanum.  6.  Nutrition:  Alb 3.3. Currently NPO  7. HD access: Has recently been referred for fistulogram, will consider completing inpatient if issues with cannulation here.     Rob Maejor Erven 07/01/2020, 1:22 PM   Recent Labs  Lab 06/28/20 0437 06/28/20 1543 06/29/20 0653 06/29/20 0653 06/30/20 0226 06/30/20 0226 06/30/20 0659 07/01/20 0308  K 5.9*   < > 4.3   < > 6.6*   < > 4.2 4.2  BUN 94*  --  63*   < > 80*  --   --  47*  CREATININE 10.03*  --  7.49*   < > 8.78*  --   --  7.06*  CALCIUM 9.2  --  8.8*   < > 8.4*  --   --  8.8*  PHOS >30.0*  --  10.9*  --   --   --   --   --    HGB 14.7   < > 14.9   < > 13.9  --   --  14.2   < > = values in this interval not displayed.   Inpatient medications: . acetaminophen  1,000 mg Oral Q8H  . aspirin  81 mg Oral Daily  . atorvastatin  80 mg Oral Daily  . carvedilol  3.125 mg Oral BID WC  . Chlorhexidine Gluconate Cloth  6 each Topical Q0600  . hydrALAZINE  25 mg Oral Q8H  . isosorbide mononitrate  30 mg Oral Daily  . lanthanum  4,000 mg Oral TID WC  . lidocaine  1 patch Transdermal Q24H  . multivitamin  1 tablet Oral Q M,W,F  . pantoprazole  40 mg Oral Daily  . sodium chloride flush  3 mL  Intravenous Q12H  . sodium chloride flush  3 mL Intravenous Q12H   . sodium chloride     sodium chloride, albuterol, cyclobenzaprine, diclofenac Sodium, HYDROcodone-acetaminophen, hydrOXYzine, lidocaine (PF), ondansetron (ZOFRAN) IV, polyethylene glycol, sodium chloride flush

## 2020-07-01 NOTE — Progress Notes (Addendum)
Family Medicine Teaching Service Daily Progress Note Intern Pager: [phone]  Patient name: Marco Arnold Medical record number: [id number] Date of birth: Jan 17, 1966 Age: 54 y.o. Gender: male  Primary Care Provider: Cyndi Bender, PA-C Consultants: Cardiology  Code Status: Full  Pt Overview and Major Events to Date:  Admitted 10/31  Assessment and Plan: Marco A Reeseis a 54 y.o.malesmokerpresenting withchest tightness and dyspnea likely due to CHF vs ACS. PMH is significant forESRD, CAD s/p PCI of LAD (06/2019), HTN, ASandchronic low back pain.  Chest tightness   ACS Acute on chronic combined systolic and diastolic HF Denies chest pain and dyspnea. History significant for CAD with prior LAD stent in 06/2019. Initially EKG demonstrated no ST elevations. CXR demonstrated enlarged cardiac silhouette and calcific atherosclerotic disease of the aorta, no focal airspace consolidation. Troponins trended 279>263>302. Echo demonstrated worsening EF 25-30% with severely decreased function of the LV and LV global hyperkinesis. Comparable to last echo a year ago that was notable for EF 35-40%with worsening cardiomyopathy.Cath performed on 11/1 notable for 80% distal left main stenosis with findings consistent with aortic valve stenosis. Cardiology recommended possible CABG possibly along with aortic valve replacement given cath results.CT chest demonstrated no evidence of aortic aneurysm with heavily calcified coronary arteries and mitral valve annulus.  - cardiology and heart failure team following, appreciatecontinuedinvolvement, follow cardiothoracic surgery recommendations, CABG scheduled tentatively for 11/5 with Dr. Orvan Arnold  -NPO for TEE today for better visualization purposes, f/u TEE - heparin gtt per cardiology - continue ASA 81 mg - continue atorvastatin 80 mg -continue carvedilol 3.125 mg bid  - cardiac monitoring -monitordaily weights - likely needs regular outpatient  cardiology follow up  ESRD Arnold in HD today.HD MWF schedule.Likely secondary to hypertensive renal disease.Most recent labs notable forCr 8.78, BUN 80 and GFR 7 compared to on admission Cr 9.09, BUN 81 and GFR 6.Avoid morphine for pain control due to renal function. - nephrologyfollowing - continue Rena-Vit, lanthanum  Thrombocytopenia Platelet count downtrending, 123 this morning. Possibly related to heparin starting during this hospitalization. -discussed with pharmacy team, looking into potential alternatives, appreciate involvement and recommendations   HTN Fluctuating blood pressures with multiple hypertensive episodes likely secondary to chronic back pain.BP132/88 this morning.BP 130/91 on admission. Home meds: amlodipine 10 mg, carvedilol 3.125 mg BID, clonidine 0.2 mg daily. Reported not taking amlodipine or carvedilol prior to admission. -continue carvedilol, increase dosage as needed  -continue to monitor  Chronic low back pain History ofchronic back pain and degenerative disc disease. He states he has tried several medications and has been to pain clinics but the only thing that helps him is "horse Liniments". Home meds: cyclobenzaprine, naproxen prn. Given ESRD status, dilautid is preferred but patient states that it has not worked to relieve pain as well in the past.  - continue cyclobenzaprine -scheduled tylenol 1g q8h  - lidocaine patch  - norco q12 prn - avoid morphine given ESRD status  GERD On pantoprazole 40 mg daily. - continue protonix  Tobacco use disorder Smokes about 1ppd. Started smoking when he was 54 yo. - refused nicotine patch -continue toencourage tobacco cessation  Prediabetes  A1c 6.1. CBG 58>48>273>97>122. No home medications. Previous hyperglycemic episode but nothing overnight. Denies dizziness or vision changes.  -diet and exercise counseling, lifestyle modifications - f/u with PCP   FEN/GI: NPO pending TEE  today PPx: heparin    Disposition: inpatient, awaiting CABG 11/5        Subjective:  No acute overnight events reported. Patient Arnold and  examined in HD. Denies any chest pain.   Objective: Temp:  [97.5 F (36.4 C)-97.9 F (36.6 C)] 97.6 F (36.4 C) (11/04 0628) Pulse Rate:  [68-69] 69 (11/04 0628) Resp:  [11-22] 14 (11/04 0628) BP: (122-163)/(76-128) 132/88 (11/04 0628) SpO2:  [90 %-100 %] 98 % (11/04 0628) Weight:  [99.7 kg-103.1 kg] 100.9 kg (11/04 0248) Physical Exam:  General: Patient laying down comfortably, in no acute distress. Cardio: RRR, no murmurs or gallops auscultated  Resp: lungs clear to auscultation bilaterally, good air movement throughout all lung fields Abdomen: soft, nontender, presence of active bowel sounds Derm: skin warm and dry to touch, chronic healing lesions noted systemically \ Ext: radial and distal pulses strong and equal bilaterally, 2+ pitting edema bilaterally  Psych: mood appropriate, pleasant   Laboratory: Recent Labs  Lab 06/29/20 0653 06/30/20 0226 07/01/20 0308  WBC 9.9 11.1* 9.4  HGB 14.9 13.9 14.2  HCT 46.4 43.4 44.0  PLT 202 149* 123*   Recent Labs  Lab 06/29/20 0653 06/29/20 0653 06/30/20 0226 06/30/20 0659 07/01/20 0308  NA 133*  --  130*  --  132*  K 4.3   < > 6.6* 4.2 4.2  CL 90*  --  92*  --  93*  CO2 21*  --  18*  --  24  BUN 63*  --  80*  --  47*  CREATININE 7.49*  --  8.78*  --  7.06*  CALCIUM 8.8*  --  8.4*  --  8.8*  GLUCOSE 139*  --  122*  --  112*   < > = values in this interval not displayed.      Imaging/Diagnostic Tests: CT chest without contrast  Result Date: 06/30/2020 CLINICAL DATA:  Thoracic aortic disease, preoperative planning EXAM: CT CHEST WITHOUT CONTRAST TECHNIQUE: Multidetector CT imaging of the chest was performed following the standard protocol without IV contrast. COMPARISON:  07/09/2018 FINDINGS: Cardiovascular: Densely calcified coronary arteries. Moderate aortic  calcifications. No aneurysm. Maximum aortic diameter 3.6 cm in the ascending thoracic aorta. Densely calcified mitral valve annulus. Mediastinum/Nodes: No mediastinal, hilar, or axillary adenopathy. Trachea and esophagus are unremarkable. Thyroid unremarkable. Lungs/Pleura: Lungs are clear. No focal airspace opacities or suspicious nodules. No effusions. Upper Abdomen: Imaging into the upper abdomen demonstrates no acute findings. Severely atrophic kidneys bilaterally. Musculoskeletal: Chest wall soft tissues are unremarkable. No acute bony abnormality. IMPRESSION: No evidence of aortic aneurysm. Heavily calcified coronary arteries and mitral valve annulus. Aortic Atherosclerosis (ICD10-I70.0). Electronically Signed   By: Rolm Baptise M.D.   On: 06/30/2020 22:03    Donney Dice, DO 07/01/2020, 6:54 AM PGY-1, [place] Intern pager: [phone], text pages welcome

## 2020-07-01 NOTE — Progress Notes (Addendum)
Patient ID: Marco Arnold, male   DOB: [birth year], 54 y.o.   MRN: [id number]     Advanced Heart Failure Rounding Note  PCP-Cardiologist: No primary care provider on file.   Subjective:    Patient seen at HD this morning.  No complaints, no chest pain or dyspnea.     Objective:   Weight Range: 101.2 kg Body mass index is 27.52 kg/m.   Vital Signs:   Temp:  [97.5 F (36.4 C)-97.9 F (36.6 C)] 97.8 F (36.6 C) (11/04 0750) Pulse Rate:  [68-69] 69 (11/04 0628) Resp:  [12-22] 20 (11/04 0900) BP: (122-163)/(76-128) 144/89 (11/04 0900) SpO2:  [90 %-99 %] 98 % (11/04 0750) Weight:  [99.7 kg-101.2 kg] 101.2 kg (11/04 0750) Last BM Date: 06/29/20  Weight change: Filed Weights   06/30/20 1240 07/01/20 0248 07/01/20 0750  Weight: 99.7 kg 100.9 kg 101.2 kg    Intake/Output:   Intake/Output Summary (Last 24 hours) at 07/01/2020 0907 Last data filed at 07/01/2020 0609 Gross per 24 hour  Intake 802.7 ml  Output 2500 ml  Net -1697.3 ml      Physical Exam    General:  Well appearing. No resp difficulty HEENT: Normal Neck: Supple. JVP 8-9 cm. Carotids 2+ bilat; no bruits. No lymphadenopathy or thyromegaly appreciated. Cor: PMI nondisplaced. Regular rate & rhythm. No rubs, gallops or murmurs. Lungs: Clear Abdomen: Soft, nontender, nondistended. No hepatosplenomegaly. No bruits or masses. Good bowel sounds. Extremities: No cyanosis, clubbing, rash. 1+ ankle edema.  Neuro: Alert & orientedx3, cranial nerves grossly intact. moves all 4 extremities w/o difficulty. Affect pleasant   Telemetry   NSR 80s (personally reviewed)   Labs    CBC Recent Labs    06/30/20 0226 07/01/20 0308  WBC 11.1* 9.4  NEUTROABS  --  6.6  HGB 13.9 14.2  HCT 43.4 44.0  MCV 85.8 87.1  PLT 149* 638*   Basic Metabolic Panel Recent Labs    06/29/20 0653 06/29/20 0653 06/30/20 0226 06/30/20 0226 06/30/20 0659 07/01/20 0308  NA 133*   < > 130*  --   --  132*  K 4.3   < > 6.6*   < > 4.2  4.2  CL 90*   < > 92*  --   --  93*  CO2 21*   < > 18*  --   --  24  GLUCOSE 139*   < > 122*  --   --  112*  BUN 63*   < > 80*  --   --  47*  CREATININE 7.49*   < > 8.78*  --   --  7.06*  CALCIUM 8.8*   < > 8.4*  --   --  8.8*  PHOS 10.9*  --   --   --   --   --    < > = values in this interval not displayed.   Liver Function Tests Recent Labs    06/29/20 0653  ALBUMIN 3.2*   No results for input(s): LIPASE, AMYLASE in the last 72 hours. Cardiac Enzymes No results for input(s): CKTOTAL, CKMB, CKMBINDEX, TROPONINI in the last 72 hours.  BNP: BNP (last 3 results) No results for input(s): BNP in the last 8760 hours.  ProBNP (last 3 results) No results for input(s): PROBNP in the last 8760 hours.   D-Dimer No results for input(s): DDIMER in the last 72 hours. Hemoglobin A1C Recent Labs    06/30/20 0226  HGBA1C 6.1*   Fasting Lipid Panel  No results for input(s): CHOL, HDL, LDLCALC, TRIG, CHOLHDL, LDLDIRECT in the last 72 hours. Thyroid Function Tests No results for input(s): TSH, T4TOTAL, T3FREE, THYROIDAB in the last 72 hours.  Invalid input(s): FREET3  Other results:   Imaging    CT chest without contrast  Result Date: 06/30/2020 CLINICAL DATA:  Thoracic aortic disease, preoperative planning EXAM: CT CHEST WITHOUT CONTRAST TECHNIQUE: Multidetector CT imaging of the chest was performed following the standard protocol without IV contrast. COMPARISON:  07/09/2018 FINDINGS: Cardiovascular: Densely calcified coronary arteries. Moderate aortic calcifications. No aneurysm. Maximum aortic diameter 3.6 cm in the ascending thoracic aorta. Densely calcified mitral valve annulus. Mediastinum/Nodes: No mediastinal, hilar, or axillary adenopathy. Trachea and esophagus are unremarkable. Thyroid unremarkable. Lungs/Pleura: Lungs are clear. No focal airspace opacities or suspicious nodules. No effusions. Upper Abdomen: Imaging into the upper abdomen demonstrates no acute findings.  Severely atrophic kidneys bilaterally. Musculoskeletal: Chest wall soft tissues are unremarkable. No acute bony abnormality. IMPRESSION: No evidence of aortic aneurysm. Heavily calcified coronary arteries and mitral valve annulus. Aortic Atherosclerosis (ICD10-I70.0). Electronically Signed   By: Rolm Baptise M.D.   On: 06/30/2020 22:03      Medications:     Scheduled Medications: . acetaminophen  1,000 mg Oral Q8H  . aspirin  81 mg Oral Daily  . atorvastatin  80 mg Oral Daily  . carvedilol  3.125 mg Oral BID WC  . Chlorhexidine Gluconate Cloth  6 each Topical Q0600  . [START ON 07/02/2020] heparin  3,000 Units Dialysis Once in dialysis  . hydrALAZINE  25 mg Oral Q8H  . isosorbide mononitrate  30 mg Oral Daily  . lanthanum  4,000 mg Oral TID WC  . lidocaine  1 patch Transdermal Q24H  . multivitamin  1 tablet Oral Q M,W,F  . pantoprazole  40 mg Oral Daily  . sodium chloride flush  3 mL Intravenous Q12H  . sodium chloride flush  3 mL Intravenous Q12H     Infusions: . sodium chloride    . heparin 1,700 Units/hr (07/01/20 0609)     PRN Medications:  sodium chloride, albuterol, cyclobenzaprine, diclofenac Sodium, HYDROcodone-acetaminophen, hydrOXYzine, ondansetron (ZOFRAN) IV, polyethylene glycol, sodium chloride flush    Assessment/Plan   1. CAD: Patient with prior history of PCI to LAD in 11/20.  Says he had 2 stents prior to this as well.  Presentation seems more likely CHF exacerbation.  HS-TnI was mildly elevated (peak 302) without a trend, seems more likely to be demand ischemia due to volume overload than actual ACS.  ECG unchanged (RBBB-like IVCD).  LHC showed a tight eccentric/calcified 80% distal left main stenosis.  He has not had viability study but does not have thinning/scarring and has some thickening of all walls, so suspect significant viable tissue.  - Discussed with Dr. Orvan Seen, plan for high-risk CABG on Friday.   - Continue ASA and high dose statin.  - He is on  heparin gtt (see below re: fall in platelets).  2. Acute on chronic systolic CHF: Echo in past (11/20) with EF 35-40% range, suspected ischemic cardiomyopathy.  Echo now with EF down to 25% on my review with mild-moderate RV dysfunction.  Cardiac index on RHC 2.3 by Fick and 2.1 by thermodilution.  On exam, he remains mildly volume overloaded but getting HD again this morning.  BP generally runs high.  - Volume control via HD, needs fluid removed today.  - Continue low dose Coreg 3.125 mg bid.  - Would avoid clonidine with cardiomyopathy.  Also  may not tolerate ACEI/ARB/ARNI as it appears that he has a tendency towards periodic hyperkalemia despite HD (can discuss with renal however).  For now, I have him on hydralazine 25 mg tid and Imdur 30 mg daily to replace clonidine.  - CABG will be high risk due to cardiomyopathy.  Suspect he will need Impella 5.5 peri-operatively.  - Long-term, if EF does not improve will need to consider ICD.  With wide IVCD, he may be CRT candidate (though looks more RBBB-like).  3. Valvular heart disease: Echo with AoV mean gradient 15 mmHg but calculated AVA 0.7 cm^2.  No significant pullback gradient across the valve on cath. Suspect moderate aortic stenosis.  There is also moderate-severe TR and moderate MR.  - Will get TEE today for better look at the valves prior to surgery to help guide valve repair/replacement needs.  Discussed risks/benefits with patient and he agrees to procedure.  4. ESRD: Since 2013, patients states it was started due to HTN.  Has not been transplant candidate due to his cardiac disease.  5. HTN: Med changes as above.  Would like to avoid clonidine.  6. Thrombocytopenia: Platelets dropped from 202 at admission to 123 today.  He is on heparin gtt. Discussed with pharmacy, think drop too quick for HIT but will need to follow closely.  - As I do not think he had ACS as presentation (suspect CHF), will stop heparin gtt today but will not start  bivalirudin as he is going to surgery tomorrow.  - Send HIT.  7. Active smoker: Needs to quit.  Has nicotine patch.  8. Skin lesions: He says that these small ulcerations have been present for months.  He has had antibiotic courses through ID in [facility] it sounds like.  Last blood cultures from his HD clinic were negative per patient.  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   - Will get ID to see him here prior to surgery.    Length of Stay: 3  Loralie Champagne, MD  07/01/2020, 9:07 AM  Advanced Heart Failure Team Pager [phone] (M-F; 7a - 4p)  Please contact [facility] for night-coverage after hours (4p -7a ) and weekends on [email or web address]

## 2020-07-01 NOTE — Progress Notes (Signed)
TCTS Progress Note  The patient is referred for consideration of complex open heart surgery. He has undergone a thorough evaluation and is considered to be at prohibitive risk for surgery due to renal failure, multiple skin lesions, and the magnitude of the operation required to fully address his underlying cardiac dysfunction in a satisfactory manner. I have discussed the details of the patient's presentation with Dr. Gilford Raid who concurs with this decision. Suggest following up with palliative care.   Marco Arnold, Marco Arnold

## 2020-07-01 NOTE — Progress Notes (Signed)
Patient denies chest discomfort/pain over night. CT chest showed densely calcified coronary arteries, moderate aortic Calcifications but no aneurysm (maximum aortic diameter 3.6 cm in the ascending thoracic aorta), and densely calcified mitral valve annulus.TEE scheduled for this afternoon, which should help determine better severity of valvular disease (aortic, tricuspid, mitral). As discussed with Dr. Orvan Seen, will ask infectious disease to evaluate need for antibiotic (multiple skin lesions). Of note, patient on Heparin drip. Platelets this am 123,000 (down from 149,000). Monitor closely.Tentatively scheduled for OR in am with Dr. Orvan Seen

## 2020-07-01 NOTE — Progress Notes (Signed)
Pre-CABG ultrasound study completed.   Please see CV Proc for preliminary results.   Tiffanyann Deroo, RDMS  

## 2020-07-02 ENCOUNTER — Encounter (HOSPITAL_COMMUNITY)
Admission: EM | Disposition: A | Discharge status: Home / Self Care | Payer: Self-pay | Source: Home / Self Care | Attending: Family Medicine

## 2020-07-02 ENCOUNTER — Other Ambulatory Visit: Payer: Self-pay | Admitting: Physician Assistant

## 2020-07-02 DIAGNOSIS — N186 End stage renal disease: Secondary | ICD-10-CM | POA: Diagnosis not present

## 2020-07-02 DIAGNOSIS — Z515 Encounter for palliative care: Secondary | ICD-10-CM

## 2020-07-02 DIAGNOSIS — M7989 Other specified soft tissue disorders: Secondary | ICD-10-CM | POA: Diagnosis not present

## 2020-07-02 DIAGNOSIS — L989 Disorder of the skin and subcutaneous tissue, unspecified: Secondary | ICD-10-CM | POA: Diagnosis not present

## 2020-07-02 DIAGNOSIS — I200 Unstable angina: Secondary | ICD-10-CM | POA: Diagnosis not present

## 2020-07-02 DIAGNOSIS — I2510 Atherosclerotic heart disease of native coronary artery without angina pectoris: Secondary | ICD-10-CM | POA: Diagnosis not present

## 2020-07-02 DIAGNOSIS — I5043 Acute on chronic combined systolic (congestive) and diastolic (congestive) heart failure: Secondary | ICD-10-CM | POA: Diagnosis not present

## 2020-07-02 DIAGNOSIS — R7303 Prediabetes: Secondary | ICD-10-CM | POA: Diagnosis not present

## 2020-07-02 DIAGNOSIS — I350 Nonrheumatic aortic (valve) stenosis: Secondary | ICD-10-CM | POA: Diagnosis not present

## 2020-07-02 DIAGNOSIS — Z7189 Other specified counseling: Secondary | ICD-10-CM

## 2020-07-02 LAB — CBC WITH DIFFERENTIAL/PLATELET
Abs Immature Granulocytes: 0.03 10*3/uL (ref 0.00–0.07)
Basophils Absolute: 0 10*3/uL (ref 0.0–0.1)
Basophils Relative: 1 %
Eosinophils Absolute: 0.6 10*3/uL — ABNORMAL HIGH (ref 0.0–0.5)
Eosinophils Relative: 6 %
HCT: 41.2 % (ref 39.0–52.0)
Hemoglobin: 13.1 g/dL (ref 13.0–17.0)
Immature Granulocytes: 0 %
Lymphocytes Relative: 5 %
Lymphs Abs: 0.5 10*3/uL — ABNORMAL LOW (ref 0.7–4.0)
MCH: 27.7 pg (ref 26.0–34.0)
MCHC: 31.8 g/dL (ref 30.0–36.0)
MCV: 87.1 fL (ref 80.0–100.0)
Monocytes Absolute: 1.2 10*3/uL — ABNORMAL HIGH (ref 0.1–1.0)
Monocytes Relative: 14 %
Neutro Abs: 6.6 10*3/uL (ref 1.7–7.7)
Neutrophils Relative %: 74 %
Platelets: 111 10*3/uL — ABNORMAL LOW (ref 150–400)
RBC: 4.73 MIL/uL (ref 4.22–5.81)
RDW: 16.8 % — ABNORMAL HIGH (ref 11.5–15.5)
WBC: 8.9 10*3/uL (ref 4.0–10.5)
nRBC: 0 % (ref 0.0–0.2)

## 2020-07-02 LAB — BASIC METABOLIC PANEL
Anion gap: 17 — ABNORMAL HIGH (ref 5–15)
BUN: 34 mg/dL — ABNORMAL HIGH (ref 6–20)
CO2: 23 mmol/L (ref 22–32)
Calcium: 9.2 mg/dL (ref 8.9–10.3)
Chloride: 90 mmol/L — ABNORMAL LOW (ref 98–111)
Creatinine, Ser: 5.87 mg/dL — ABNORMAL HIGH (ref 0.61–1.24)
GFR, Estimated: 11 mL/min — ABNORMAL LOW (ref >60)
Glucose, Bld: 117 mg/dL — ABNORMAL HIGH (ref 70–99)
Potassium: 3.8 mmol/L (ref 3.5–5.1)
Sodium: 130 mmol/L — ABNORMAL LOW (ref 135–145)

## 2020-07-02 LAB — ACID FAST SMEAR (AFB, MYCOBACTERIA): Acid Fast Smear: NEGATIVE

## 2020-07-02 LAB — HEPARIN INDUCED PLATELET AB (HIT ANTIBODY): Heparin Induced Plt Ab: 0.21 OD (ref 0.000–0.400)

## 2020-07-02 LAB — URIC ACID: Uric Acid, Serum: 5.2 mg/dL (ref 3.7–8.6)

## 2020-07-02 SURGERY — CORONARY ARTERY BYPASS GRAFTING (CABG)
Anesthesia: General | Site: Chest

## 2020-07-02 MED ORDER — HEPARIN SODIUM (PORCINE) 5000 UNIT/ML IJ SOLN
5000.0000 [IU] | Freq: Three times a day (TID) | INTRAMUSCULAR | Status: DC
  Administered 2020-07-02 – 2020-07-03 (×2): 5000 [IU] via SUBCUTANEOUS
  Filled 2020-07-02 (×3): qty 1

## 2020-07-02 MED ORDER — CARVEDILOL 6.25 MG PO TABS
6.2500 mg | ORAL_TABLET | Freq: Two times a day (BID) | ORAL | Status: DC
  Administered 2020-07-02 – 2020-07-03 (×4): 6.25 mg via ORAL
  Filled 2020-07-02 (×4): qty 1

## 2020-07-02 NOTE — Anesthesia Postprocedure Evaluation (Signed)
Anesthesia Post Note  Patient: Marco Arnold  Procedure(s) Performed: TRANSESOPHAGEAL ECHOCARDIOGRAM (TEE) (N/A )     Patient location during evaluation: Endoscopy Anesthesia Type: MAC Level of consciousness: awake and alert Pain management: pain level controlled Vital Signs Assessment: post-procedure vital signs reviewed and stable Respiratory status: spontaneous breathing, nonlabored ventilation, respiratory function stable and patient connected to nasal cannula oxygen Cardiovascular status: blood pressure returned to baseline and stable Postop Assessment: no apparent nausea or vomiting Anesthetic complications: no   No complications documented.  Last Vitals:  Vitals:   07/02/20 0537 07/02/20 0859  BP: (!) 142/90 128/87  Pulse: 76 78  Resp: 18 20  Temp: 36.8 C 36.6 C  SpO2: 98% 100%    Last Pain:  Vitals:   07/02/20 0859  TempSrc: Oral  PainSc:                  Davis S

## 2020-07-02 NOTE — Progress Notes (Addendum)
Marco Interim Progress Note  Mr. Farruggia is a 54 year old gentleman on our service with multivessel coronary disease who was awaiting CABG tentatively scheduled on 11/5 via Marco.  However after multidisciplinary Arnold, Marco Arnold, Marco Arnold will continue to medically manage and hold off on CABG. Hopeful to have further Arnold with Marco to discuss specific reasoning with patient. Very much appreciate assistance from cardiology and Marco team.   Donney Dice, DO 07/02/2020, 9:37 AM PGY-1, [facility] pager [phone]

## 2020-07-02 NOTE — Hospital Course (Addendum)
Marco Arnold is a 54 y.o. male smoker presenting with chest tightness and dyspnea likely due to CHF vs ACS. PMH is significant for ESRD, CAD s/p PCI of LAD (06/2019), HTN, AS, chronic low back pain.  ACS  Acute on chronic combined systolic and diastolic HF Presented with chest pain and worsening dyspnea. Initial EKG showed no ST elevations. CXR demonstrated enlarged cardiac silhouette and calcific atherosclerosis disease of the aorta, no focal airspace consolidation. Troponins trended 279>263>302. Echo demonstrated worsening EF 25-30% with severely decreased function of the LV and LV global hyperkinesis compared to echo a year ago with EF 35-40% with worsening cardiomyopathy. Cardiology consulted and followed patient throughout his hospitalization. Cath performed notable for 80% distal left main stenosis with findings consistent with aortic valve stenosis. Recommended CABG with possible aortic valve replacement. Placed on atorvastatin 80 mg, aspirin 81 mg and carvedilol 3.125 mg bid. HF team and cardiothoracic surgery began following patient. HF team also recommended hydralazine 25 tid and imdur 30 mg daily. TEE findings consistent with echo, also notable for moderate mitral and tricuspid regurgitation. Decision was later made by cardiothoracic surgery to not perform CABG due to high risk situation given patient's renal failure. Cardiothoracic surgery consulted palliative care given patient's multiple comorbidities. Patient requesting to seek care at [facility] to get second opinion.   ESRD Patient receiving outpatient dialysis, compliant on MWF schedule. Nephrology consulted and patient had multiple sessions of HD during hospital stay. Not recommended to use morphine for pain control due to ESRD status. At discharge, Cr 8.11 and GFR 7.   Thrombocytopenia Platelet count wnl on admission. Began to gradually downtrend. On day of discharge was 107.  Patient initially started on heparin for tentative CABG, heparin  discontinued and HIT assay negative.   Prediabetes A1c 6.1. Patient had hyperglycemic episode during hospitalization and required 10 units of insulin once. CBGs monitored and improved prior to discharge.   Multiple skin lesions  Bilateral foot pain Patient presented on admission with systemic, healing skin lesions. General surgery performed punch biopsy which was notable for MRSA. Placed on antibiotics, instructed to take***Bid for total 7 day course, patient discharged with this medication. Orthopedics consulted due to bilateral foot pain, thought to be at least neuropathic in origin. Both right and left x-rays demonstrated diffuse soft tissue swelling without acute osseous abnormality, degenerative changes in the dorsal tarsal joints and small achilles and plantar calcaneal spurs bilaterally. 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 Findings not consistent with fracture, gout, osteomyelitis and septic arthritis.    All other issues stable and chronic.  F/u: A1c in 3 months and BMP to monitor sugar, CBC w/ diff to monitor thrombocytopenia, encouraging tobacco cessation, consider cardiology outpatient follow up

## 2020-07-02 NOTE — Care Management Important Message (Signed)
Important Message  Patient Details  Name: Marco Arnold MRN: [id number] Date of Birth: [birth year]   Medicare Important Message Given:  Yes     Shelda Altes 07/02/2020, 9:43 AM

## 2020-07-02 NOTE — Consult Note (Signed)
[facility]   MULTIDISCIPLINARY HEART VALVE TEAM  Date:  07/02/2020   ID:  Marco Arnold, DOB [birth year], MRN [id number]  PCP:  Cyndi Bender, PA-C   Chief Complaint  Patient presents with  . Chest Pain     HISTORY OF PRESENT ILLNESS: Marco Arnold is a 54 y.o. male hospitalized with acute on chronic systolic heart failure, noted to have significant aortic stenosis and left main coronary artery disease.  Structural heart consultation is requested by Dr. Aundra Dubin to evaluate treatment options after the patient is felt to be a prohibitive risk of surgical AVR/CABG.  The patient is independently evaluated and examined today.  His father is at the bedside.  I have discussed his case with Dr. Aundra Dubin.  He has a very complex history with longstanding end-stage renal disease secondary to uncontrolled hypertension.  He has been on hemodialysis for approximately 9 years.  He has a long history of tobacco use.  The patient has a history of MI and has undergone prior LAD stenting.  He is admitted with progressive shortness of breath and found to have severe left ventricular dysfunction.  He has known aortic stenosis which has been in the mild to moderate range by different studies in the past.  He has undergone cardiac catheterization and echo assessment of his aortic stenosis in the past.  The patient reports poor quality of life.  He is generally fatigued and short of breath with activity.  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  He reports that he recently had an infection in his right forearm with purulence coming from an open area.  He has undergone extensive evaluation here during this hospitalization.  Cardiac catheterization demonstrated severe distal left main stenosis.  Echo and transesophageal echo studies have been performed and his mean transaortic gradient by echo is 15 mmHg.  TEE shows calcification and restriction of a  trileaflet aortic valve with moderate to severe stenosis.  By cardiac catheterization, the aortic valve was crossed without difficulty and there was no significant transaortic gradient.  He underwent formal cardiac surgical evaluation for consideration of CABG/AVR and he was felt to be at prohibitive risk of surgery.  Structural heart consultation is requested to evaluate whether he may have any further treatment options.  The patient has not had regular dental care and reports poor dentition.  He has multiple missing teeth and has not seen a dentist in at least a few years.  Past Medical History:  Diagnosis Date  . Arthritis    spine  . Chronic kidney disease    [place] MWF  . Coronary artery disease   . Dialysis patient [facility]) 2013  . Diverticulitis   . Hyperlipidemia   . Hypertension    dr  Tobie Lords      @ [facility]  med  . Neuromuscular disorder ([place])    nerve pain - tx w/cymbalta  . Neuropathic pain 09/20/2017   Left mid-thoracic    Current Facility-Administered Medications  Medication Dose Route Frequency Provider Last Rate Last Admin  . 0.9 %  sodium chloride infusion  250 mL Intravenous PRN Lorretta Harp, MD   New Bag at 07/01/20 1416  . 0.9 %  sodium chloride infusion   Intravenous Continuous Larey Dresser, MD 10 mL/hr at 07/01/20 1348 New Bag at 07/01/20 1348  . acetaminophen (TYLENOL) tablet 1,000 mg  1,000 mg Oral Q8H Gifford Shave, MD  1,000 mg at 07/02/20 0535  . albuterol (VENTOLIN HFA) 108 (90 Base) MCG/ACT inhaler 2 puff  2 puff Inhalation Q6H PRN Lorretta Harp, MD      . aspirin chewable tablet 81 mg  81 mg Oral Daily Lorretta Harp, MD   81 mg at 07/02/20 0834  . atorvastatin (LIPITOR) tablet 80 mg  80 mg Oral Daily Lorretta Harp, MD   80 mg at 07/02/20 0835  . carvedilol (COREG) tablet 6.25 mg  6.25 mg Oral BID WC Larey Dresser, MD   6.25 mg at 07/02/20 0839  . Chlorhexidine Gluconate Cloth 2 % PADS 6 each  6 each Topical Q0600 Lorretta Harp, MD   6 each at 07/01/20 [phone]  . Chlorhexidine Gluconate Cloth 2 % PADS 6 each  6 each Topical Q0600 Leeanne Rio, MD   6 each at 07/02/20 0840  . cyclobenzaprine (FLEXERIL) tablet 20 mg  20 mg Oral BID PRN Lorretta Harp, MD      . diclofenac Sodium (VOLTAREN) 1 % topical gel 4 g  4 g Topical QID PRN Lorretta Harp, MD   4 g at 07/02/20 0843  . hydrALAZINE (APRESOLINE) tablet 25 mg  25 mg Oral Q8H Larey Dresser, MD   25 mg at 07/02/20 0544  . HYDROcodone-acetaminophen (NORCO/VICODIN) 5-325 MG per tablet 1-2 tablet  1-2 tablet Oral Q12H PRN Gifford Shave, MD   2 tablet at 07/02/20 0847  . hydrOXYzine (ATARAX/VISTARIL) tablet 25 mg  25 mg Oral BID PRN Lorretta Harp, MD   25 mg at 06/30/20 1359  . isosorbide mononitrate (IMDUR) 24 hr tablet 30 mg  30 mg Oral Daily Larey Dresser, MD   30 mg at 07/02/20 0835  . lanthanum (FOSRENOL) chewable tablet 4,000 mg  4,000 mg Oral TID WC Lorretta Harp, MD   4,000 mg at 07/02/20 0834  . lidocaine (LIDODERM) 5 % 1 patch  1 patch Transdermal Q24H Gifford Shave, MD      . lidocaine (PF) (XYLOCAINE) 1 % injection 20 mL  20 mL Intradermal PRN Meuth, Brooke A, PA-C      . multivitamin (RENA-VIT) tablet 1 tablet  1 tablet Oral Q M,W,F Lorretta Harp, MD   1 tablet at 07/02/20 [phone]  . mupirocin ointment (BACTROBAN) 2 % 1 application  1 application Nasal BID Leeanne Rio, MD   1 application at 94/70/96 [phone]  . ondansetron (ZOFRAN) injection 4 mg  4 mg Intravenous Q6H PRN Lorretta Harp, MD      . pantoprazole (PROTONIX) EC tablet 40 mg  40 mg Oral Daily Lorretta Harp, MD   40 mg at 07/02/20 0835  . polyethylene glycol (MIRALAX / GLYCOLAX) packet 17 g  17 g Oral Daily PRN Lorretta Harp, MD   17 g at 06/29/20 1725  . sodium chloride flush (NS) 0.9 % injection 3 mL  3 mL Intravenous Q12H Lorretta Harp, MD   3 mL at 07/02/20 0835  . sodium chloride flush (NS) 0.9 % injection 3 mL  3 mL Intravenous Q12H  Lorretta Harp, MD   3 mL at 07/02/20 0837  . sodium chloride flush (NS) 0.9 % injection 3 mL  3 mL Intravenous PRN Lorretta Harp, MD        ALLERGIES:   Lisinopril and Heparin   SOCIAL HISTORY:  The patient  reports that he has been smoking cigarettes. He has a 10.75 pack-year smoking  history. He has never used smokeless tobacco. He reports current alcohol use. He reports current drug use. Drug: Marijuana.   FAMILY HISTORY:  The patient's family history includes Cancer - Lung in his paternal grandfather; Diabetes in his father; Hypertension in his father; Pancreatic cancer in his mother.   REVIEW OF SYSTEMS:  Positive for pain in his legs, leg swelling.   All other systems are reviewed and negative.   PHYSICAL EXAM: VS:  BP 128/87 (BP Location: Right Arm)   Pulse 78   Temp 97.8 F (36.6 C) (Oral)   Resp 20   Ht 6' 3.5" (1.918 m)   Wt 99.7 kg   SpO2 100%   BMI 27.11 kg/m  , BMI Body mass index is 27.11 kg/m. GEN: Well nourished, well developed, in no acute distress HEENT: normal Neck: No JVD. carotids 2+ with bilateral bruits Cardiac: The heart is RRR with 2/6 harsh crescendo decrescendo murmur at the right upper sternal border.  1+ bilateral pretibial edema with erythema of the feet bilaterally Respiratory:  clear to auscultation bilaterally GI: soft, nontender, nondistended, + BS MS: no deformity or atrophy Skin: warm and dry, diffuse rash with multiple skin lesions overall of the extremities Neuro:  Strength and sensation are intact Psych: euthymic mood, full affect   RECENT LABS: 03/23/2020: ALT 27 07/02/2020: BUN 34; Creatinine, Ser 5.87; Hemoglobin 13.1; Platelets 111; Potassium 3.8; Sodium 130  No results found for requested labs within last 8760 hours.   Estimated Creatinine Clearance: 17.4 mL/min (A) (by C-G formula based on SCr of 5.87 mg/dL (H)).   Wt Readings from Last 3 Encounters:  07/02/20 99.7 kg  06/15/20 103 kg  05/29/19 106.6 kg     CARDIAC  STUDIES:  Echo:  IMPRESSIONS    1. Left ventricular ejection fraction, by estimation, is 25 to 30%. The  left ventricle has severely decreased function. The left ventricle  demonstrates global hypokinesis. The left ventricular internal cavity size  was mildly dilated. Left ventricular  diastolic parameters are indeterminate.  2. Right ventricular systolic function is moderately reduced. The right  ventricular size is moderately enlarged. There is mildly elevated  pulmonary artery systolic pressure. The estimated right ventricular  systolic pressure is 24.2 mmHg.  3. Left atrial size was moderately dilated.  4. Right atrial size was moderately dilated.  5. The mitral valve is degenerative. Moderate mitral valve regurgitation.  No evidence of mitral stenosis.  6. Tricuspid valve regurgitation is severe.  7. Low flow, low gradient aortic stenosis. DI is 0.20. The aortic valve  is tricuspid. There is severe calcifcation of the aortic valve. There is  severe thickening of the aortic valve. Aortic valve regurgitation is not  visualized. Moderate to severe  aortic valve stenosis. Aortic valve area, by VTI measures 0.73 cm. Aortic  valve mean gradient measures 15.0 mmHg. Aortic valve Vmax measures 2.89  m/s.  8. The inferior vena cava is dilated in size with >50% respiratory  variability, suggesting right atrial pressure of 8 mmHg.    Cardiac Cath:   CARDIAC CATHETERIZATION     History obtained from chart review.54 y.o.malewith a hx of CAD withs/p LADPCI 06/2019 at [facility], AS, ESRD, tobacco use who was admitted with chest pain and shortness of breath.  His troponins were mildly elevated in the 300 range but flat.  His EKG shows bundle branch block.  2D echo showed EF of 25 to 30% with severe aortic stenosis.  Resents now for right left heart cath to define  his anatomy and physiology he did undergo hemodialysis today because of hyperkalemia   PROCEDURE DESCRIPTION:    The patient was brought to the second floor [facility] lab in the postabsorptive state. He was premedicated with IV Versed and fentanyl. His right groin was prepped and shaved in usual sterile fashion. Xylocaine 1% was used for local anesthesia. A 5 French sheath was inserted into the right common femoral artery using standard Seldinger technique.  A 7 French sheath was inserted into the right common femoral vein.  Both were inserted using ultrasound guided access and digital images were captured and saved.  A 7 [place] balloontipped thermal dilution Swan-Ganz catheter was then advanced to the right heart chambers obtaining sequential pressures and blood samples for the determination of Fick and thermodilution cardiac output.  5 French right left Judkins diagnostic catheters along with a 5 [place] EBU guide catheter used for selective coronary angiography and obtaining left heart pressures.  Isovue dye was used for the entirety of the case.  Retrograde aortic, left ventricular and pullback pressures were recorded.  Right common femoral angiogram was performed and Mynx closure devices were successfully deployed in the right common femoral artery and vein.   IMPRESSION: Marco Arnold has patent LAD stents although there does appear to be in-stent restenosis within in the proximal/ostial portion.  He has a left dominant system.  He does have an 80% eccentric distal left main stenosis seen in multiple views which appears hemodynamically significant.  I did cross the aortic valve demonstrated no pullback gradient.  Based on his anatomy, he will require CABG plus or minus aortic valve replacement depending on the transesophageal echo performed at the time of bypass grafting.  Mynx closure devices were successfully deployed in both the right common femoral artery and vein.  The patient left lab in stable condition.  Dr. Gardiner Rhyme was notified of these results.    TEE: IMPRESSIONS    1. Left  ventricular ejection fraction, by estimation, is 20 to 25%. The  left ventricle has severely decreased function. The left ventricle  demonstrates global hypokinesis. The left ventricular internal cavity size  was mildly dilated. There is mild left  ventricular hypertrophy.  2. Right ventricular systolic function is moderately reduced. The right  ventricular size is mildly enlarged.  3. Left atrial size was moderately dilated. No left atrial/left atrial  appendage thrombus was detected.  4. Right atrial size was moderately dilated.  5. The mitral valve is normal in structure. Moderate mitral valve  regurgitation. No evidence of mitral stenosis.  6. Peak RV-RA gradient 29 mmHg. Tricuspid valve regurgitation is  moderate.  7. There was moderate to severe aortic stenosis with mean gradient 24  mmHg, AVA 0.84 cm^2 by continuity equation and 1.2 cm^2 by planimetry. The  aortic valve is tricuspid. Aortic valve regurgitation is not visualized.  8. Small PFO noted by color doppler.  9. Normal caliber thoracic aorta with mild plaque.   ASSESSMENT AND PLAN: 1.  Acute on chronic systolic heart failure with evidence of biventricular failure by echo assessment 2.  Severe distal left main disease 3.  Severe, stage D2 low-flow low gradient aortic stenosis 4.  End-stage renal disease 5.  Skin lesions question calciphylaxis  I have reviewed the natural history of aortic stenosis with the patient and their family members who are present today. We have discussed the limitations of medical therapy and the poor prognosis associated with symptomatic aortic stenosis. We have reviewed potential treatment options, including  palliative medical therapy, conventional surgical aortic valve replacement, and transcatheter aortic valve replacement. We discussed treatment options in the context of the patient's specific comorbid medical conditions.    I have personally reviewed the patient's cardiac  catheterization and echo images.  He has severe comorbid medical conditions including biventricular heart failure and end-stage renal disease.  With a combination of severe distal left main disease and severe low-flow low gradient aortic stenosis on a background of his comorbidities, I think his risk of interventional therapy such as TAVR is prohibitive.  I have recommended ongoing medical therapy.  The patient understands that he has poor prognosis, but I do not think TAVR would significantly improve his quality of life and I think it would come at exceedingly high risk with comorbidities outlined above.  He also will be high risk of prosthetic valve endocarditis with end-stage renal disease and his skin disorder.  The patient understands and is appreciative of our conversation today.  We spent a long time talking about treatment options.  I did my best to answer all of his questions today.  He would like to seek a second opinion at a tertiary center such as [facility].  I will touch base with Dr. Aundra Dubin to see if this can be facilitated on an outpatient basis.  Deatra James 07/02/2020 11:40 AM     [facility] [address] [place] [address]  [phone] (office) [phone] (fax)

## 2020-07-02 NOTE — Progress Notes (Signed)
[facility] Progress Note  Subjective: seen in room, no new c/o  Vitals:   07/01/20 2000 07/02/20 0537 07/02/20 0859 07/02/20 1225  BP: (!) 104/58 (!) 142/90 128/87 138/86  Pulse: 72 76 78 75  Resp: 18 18 20 20   Temp: 97.8 F (36.6 C) 98.2 F (36.8 C) 97.8 F (36.6 C) 97.9 F (36.6 C)  TempSrc:  Oral Oral Oral  SpO2: 100% 98% 100% 100%  Weight:  99.7 kg    Height:        Exam:   alert, nad   no jvd  Chest cta bilat  Cor reg no RG  Abd soft ntnd no ascites obese   Ext mild pitting bilat ankle edema   Alert, NF, ox3   LUE AVF+bruit     OP HD: [place]  MWF   4.5h  450/ 2.0  99.5kg  2/2 bath  P2  15g  Hep 3000  LUE AVF   - max UF is 7kg during week and 9kg on mondays   Hectorol 7 mcg IVP q HD   Sensipar 60mg  PO q HD   Lanthanum 4 tabs PO q meal  Assessment/ Plan: 1. NSTEMI: Troponin elevated but flat. Echo showed worsening CHF with EF 25-30%. LHC showed severe L main lesion, patent old LAD stent.  Per TCTS is too high risk for CABG.  Pall care consulting, and cardiology.  2.  ESRD:  On MWF schedule. Short HD Sat if still here to get back on schedule.  3.  Hypertension/volume: BP meds per cards, on imdur, hydralazine and coreg now. LVEDP was good at [facility] at 47mm Hg. At dry wt (104 > 99kg here w/ HD).  4.  Anemia: Hgb >12, no ESA indicated.  5.  Metabolic bone disease: Outpatient phos usually around 13. Phos here was 10. Pt is not compliant with renal diet. Continue lanthanum.  6.  Nutrition:  Alb 3.3. Currently NPO  7. HD access: Has recently been referred for fistulogram, will consider completing inpatient if issues with cannulation here.     Rob Aradhya Shellenbarger 07/02/2020, 2:26 PM   Recent Labs  Lab 06/28/20 0437 06/28/20 1543 06/29/20 0653 06/30/20 0226 07/01/20 0308 07/02/20 0156  K 5.9*   < > 4.3   < > 4.2 3.8  BUN 94*  --  63*   < > 47* 34*  CREATININE 10.03*  --  7.49*   < > 7.06* 5.87*  CALCIUM 9.2  --  8.8*   < > 8.8* 9.2  PHOS >30.0*  --   10.9*  --   --   --   HGB 14.7   < > 14.9   < > 14.2 13.1   < > = values in this interval not displayed.   Inpatient medications: . acetaminophen  1,000 mg Oral Q8H  . aspirin  81 mg Oral Daily  . atorvastatin  80 mg Oral Daily  . carvedilol  6.25 mg Oral BID WC  . Chlorhexidine Gluconate Cloth  6 each Topical Q0600  . Chlorhexidine Gluconate Cloth  6 each Topical Q0600  . hydrALAZINE  25 mg Oral Q8H  . isosorbide mononitrate  30 mg Oral Daily  . lanthanum  4,000 mg Oral TID WC  . lidocaine  1 patch Transdermal Q24H  . multivitamin  1 tablet Oral Q M,W,F  . mupirocin ointment  1 application Nasal BID  . pantoprazole  40 mg Oral Daily  . sodium chloride flush  3 mL Intravenous Q12H  .  sodium chloride flush  3 mL Intravenous Q12H   . sodium chloride    . sodium chloride 10 mL/hr at 07/01/20 1348   sodium chloride, albuterol, cyclobenzaprine, diclofenac Sodium, HYDROcodone-acetaminophen, hydrOXYzine, lidocaine (PF), ondansetron (ZOFRAN) IV, polyethylene glycol, sodium chloride flush

## 2020-07-02 NOTE — Progress Notes (Signed)
    [facility] for Infectious Disease   Reason for visit: Follow up on rash  Interval History: s/p biopsy of lesions; postponing surgery.    Physical Exam: Constitutional:  Vitals:   07/02/20 0537 07/02/20 0859  BP: (!) 142/90 128/87  Pulse: 76 78  Resp: 18 20  Temp: 98.2 F (36.8 C) 97.8 F (36.6 C)  SpO2: 98% 100%   patient appears in NAD  Impression: skin lesions of unknown etiology.  Biopsy sent.  No indication for antibiotics with no signs of active infection at this time.   I appreciate surgeries help with getting a biopsy and orthopedic evaluation  Plan: 1.  Will continue to monitor.  Dr. Baxter Flattery available over the weekend if needed, otherwise ID team will follow up Monday

## 2020-07-02 NOTE — Progress Notes (Addendum)
Patient ID: Marco Arnold, male   DOB: 1966-01-05, 54 y.o.   MRN: [id number]     Advanced Heart Failure Rounding Note  PCP-Cardiologist: No primary care provider on file.   Subjective:    Evaluated by CT surgery and felt to be too high risk for CABG/SAVR. Palliative care to discuss [place] today.   S/p punch biopsy of skin lesions 11/4, pathology pending. AF. WBC WNL.   Platelets continue to drop, 202>>123>>111. HIT panel pending. Hgb ok at 13.   -2.5L fluid removal w/ HD yesterday.   No events overnight.   Continues w/ significant foot pain. Uric acid level pending.    Objective:   Weight Range: 99.7 kg Body mass index is 27.11 kg/m.   Vital Signs:   Temp:  [97.7 F (36.5 C)-98.4 F (36.9 C)] 98.2 F (36.8 C) (11/05 0537) Pulse Rate:  [67-76] 76 (11/05 0537) Resp:  [12-22] 18 (11/05 0537) BP: (104-159)/(58-105) 142/90 (11/05 0537) SpO2:  [96 %-100 %] 98 % (11/05 0537) Weight:  [98.2 kg-101.2 kg] 99.7 kg (11/05 0537) Last BM Date: 06/29/20  Weight change: Filed Weights   07/01/20 0750 07/01/20 1204 07/02/20 0537  Weight: 101.2 kg 98.2 kg 99.7 kg    Intake/Output:   Intake/Output Summary (Last 24 hours) at 07/02/2020 0708 Last data filed at 07/02/2020 0405 Gross per 24 hour  Intake 757 ml  Output 2500 ml  Net -1743 ml      Physical Exam    General:  Well appearing. No resp difficulty HEENT: Normal Neck: Supple. JVP 8-9 cm. Carotids 2+ bilat; no bruits. No lymphadenopathy or thyromegaly appreciated. Cor: PMI nondisplaced. Regular rate & rhythm. No rubs, gallops or murmurs. Lungs: Clear Abdomen: Soft, nontender, nondistended. No hepatosplenomegaly. No bruits or masses. Good bowel sounds. Extremities: No cyanosis, clubbing, rash. 1+ ankle edema.  Neuro: Alert & orientedx3, cranial nerves grossly intact. moves all 4 extremities w/o difficulty. Affect pleasant   Telemetry   NSR 80s (personally reviewed)   Labs    CBC Recent Labs    07/01/20 0308  07/02/20 0156  WBC 9.4 8.9  NEUTROABS 6.6 6.6  HGB 14.2 13.1  HCT 44.0 41.2  MCV 87.1 87.1  PLT 123* 062*   Basic Metabolic Panel Recent Labs    07/01/20 0308 07/02/20 0156  NA 132* 130*  K 4.2 3.8  CL 93* 90*  CO2 24 23  GLUCOSE 112* 117*  BUN 47* 34*  CREATININE 7.06* 5.87*  CALCIUM 8.8* 9.2   Liver Function Tests No results for input(s): AST, ALT, ALKPHOS, BILITOT, PROT, ALBUMIN in the last 72 hours. No results for input(s): LIPASE, AMYLASE in the last 72 hours. Cardiac Enzymes No results for input(s): CKTOTAL, CKMB, CKMBINDEX, TROPONINI in the last 72 hours.  BNP: BNP (last 3 results) No results for input(s): BNP in the last 8760 hours.  ProBNP (last 3 results) No results for input(s): PROBNP in the last 8760 hours.   D-Dimer No results for input(s): DDIMER in the last 72 hours. Hemoglobin A1C Recent Labs    06/30/20 0226  HGBA1C 6.1*   Fasting Lipid Panel No results for input(s): CHOL, HDL, LDLCALC, TRIG, CHOLHDL, LDLDIRECT in the last 72 hours. Thyroid Function Tests No results for input(s): TSH, T4TOTAL, T3FREE, THYROIDAB in the last 72 hours.  Invalid input(s): FREET3  Other results:   Imaging    ECHO TEE  Result Date: 07/01/2020    TRANSESOPHOGEAL ECHO REPORT   Patient Name:   Jermayne A Clear Date of  Exam: 07/01/2020 Medical Rec #:  [id number]     Height:       75.5 in Accession #:    [id number]    Weight:       216.5 lb Date of Birth:  [birth year]     BSA:          2.281 m Patient Age:    52 years      BP:           106/88 mmHg Patient Gender: M             HR:           68 bpm. Exam Location:  Inpatient Procedure: Transesophageal Echo, Cardiac Doppler and Color Doppler Indications:     I35.0 Nonrheumatic aortic (valve) stenosis  History:         Patient has prior history of Echocardiogram examinations, most                  recent 06/27/2020. CAD, Mitral Valve Disease and Aortic Valve                  Disease; Risk Factors:Hypertension and  Dyslipidemia. CKD.  Sonographer:     Jonelle Sidle Dance Referring Phys:  [place] Diagnosing Phys: Loralie Champagne MD PROCEDURE: The transesophogeal probe was passed without difficulty through the esophogus of the patient. Local oropharyngeal anesthetic was provided with Cetacaine. Sedation performed by different physician. The patient was monitored while under deep sedation. Anesthestetic sedation was provided intravenously by Anesthesiology: 177.48mg  of Propofol, 100mg  of Lidocaine. The patient developed no complications during the procedure. IMPRESSIONS  1. Left ventricular ejection fraction, by estimation, is 20 to 25%. The left ventricle has severely decreased function. The left ventricle demonstrates global hypokinesis. The left ventricular internal cavity size was mildly dilated. There is mild left ventricular hypertrophy.  2. Right ventricular systolic function is moderately reduced. The right ventricular size is mildly enlarged.  3. Left atrial size was moderately dilated. No left atrial/left atrial appendage thrombus was detected.  4. Right atrial size was moderately dilated.  5. The mitral valve is normal in structure. Moderate mitral valve regurgitation. No evidence of mitral stenosis.  6. Peak RV-RA gradient 29 mmHg. Tricuspid valve regurgitation is moderate.  7. There was moderate to severe aortic stenosis with mean gradient 24 mmHg, AVA 0.84 cm^2 by continuity equation and 1.2 cm^2 by planimetry. The aortic valve is tricuspid. Aortic valve regurgitation is not visualized.  8. Small PFO noted by color doppler.  9. Normal caliber thoracic aorta with mild plaque. FINDINGS  Left Ventricle: Left ventricular ejection fraction, by estimation, is 20 to 25%. The left ventricle has severely decreased function. The left ventricle demonstrates global hypokinesis. The left ventricular internal cavity size was mildly dilated. There is mild left ventricular hypertrophy. Right Ventricle: The right ventricular  size is mildly enlarged. No increase in right ventricular wall thickness. Right ventricular systolic function is moderately reduced. Left Atrium: Left atrial size was moderately dilated. No left atrial/left atrial appendage thrombus was detected. Right Atrium: Right atrial size was moderately dilated. Pericardium: Trivial pericardial effusion is present. Mitral Valve: The mitral valve is normal in structure. Moderate mitral valve regurgitation. No evidence of mitral valve stenosis. Tricuspid Valve: Peak RV-RA gradient 29 mmHg. The tricuspid valve is normal in structure. Tricuspid valve regurgitation is moderate. Aortic Valve: There was moderate to severe aortic stenosis with mean gradient 24 mmHg, AVA 0.84 cm^2 by continuity equation and 1.2 cm^2 by planimetry.  The aortic valve is tricuspid. Aortic valve regurgitation is not visualized. Aortic valve mean gradient measures 22.0 mmHg. Aortic valve peak gradient measures 35.5 mmHg. Aortic valve area, by VTI measures 0.87 cm. Pulmonic Valve: The pulmonic valve was normal in structure. Pulmonic valve regurgitation is not visualized. Aorta: Normal caliber thoracic aorta with mild plaque. and the aortic arch was not well visualized. IAS/Shunts: Small PFO noted by color doppler.  LEFT VENTRICLE PLAX 2D LVOT diam:     2.10 cm LV SV:         49 LV SV Index:   21 LVOT Area:     3.46 cm  AORTIC VALVE AV Area (Vmax):    0.95 cm AV Area (Vmean):   0.80 cm AV Area (VTI):     0.87 cm AV Vmax:           298.00 cm/s AV Vmean:          219.000 cm/s AV VTI:            0.564 m AV Peak Grad:      35.5 mmHg AV Mean Grad:      22.0 mmHg LVOT Vmax:         81.50 cm/s LVOT Vmean:        50.400 cm/s LVOT VTI:          0.141 m LVOT/AV VTI ratio: 0.25 TRICUSPID VALVE TR Peak grad:   27.5 mmHg TR Vmax:        262.00 cm/s  SHUNTS Systemic VTI:  0.14 m Systemic Diam: 2.10 cm Loralie Champagne MD Electronically signed by Loralie Champagne MD Signature Date/Time: 07/01/2020/8:56:00 PM    Final    VAS  US DOPPLER PRE CABG  Result Date: 07/01/2020 PREOPERATIVE VASCULAR EVALUATION  Risk Factors:     Hypertension, current smoker, coronary artery disease. Other Factors:    ESRD, on dialysis, aortic and mitral valve disease. Comparison Study: No prior study Performing Technologist: Sharion Dove RVS Supporting Technologist: Abram Sander RVS, Darlin Coco RDMS  Examination Guidelines: A complete evaluation includes B-mode imaging, spectral Doppler, color Doppler, and power Doppler as needed of all accessible portions of each vessel. Bilateral testing is considered an integral part of a complete examination. Limited examinations for reoccurring indications may be performed as noted.  Right Carotid Findings: +----------+--------+--------+--------+--------+------------------+           PSV cm/sEDV cm/sStenosisDescribeComments           +----------+--------+--------+--------+--------+------------------+ CCA Prox  44      7                       intimal thickening +----------+--------+--------+--------+--------+------------------+ CCA Distal30      7                       intimal thickening +----------+--------+--------+--------+--------+------------------+ ICA Prox  34      13      1-39%   calcific                   +----------+--------+--------+--------+--------+------------------+ ICA Distal44      16                                         +----------+--------+--------+--------+--------+------------------+ ECA       64      6                                          +----------+--------+--------+--------+--------+------------------+  Portions of this table do not appear on this page. +----------+--------+-------+----------------+------------+           PSV cm/sEDV cmsDescribe        Arm Pressure +----------+--------+-------+----------------+------------+ [id number]             Multiphasic, WNL             +----------+--------+-------+----------------+------------+  +---------+--------+--+--------+--+---------+ VertebralPSV cm/s38EDV cm/s12Antegrade +---------+--------+--+--------+--+---------+ Left Carotid Findings: +----------+--------+--------+--------+--------+------------------+           PSV cm/sEDV cm/sStenosisDescribeComments           +----------+--------+--------+--------+--------+------------------+ CCA Prox  35      13                      intimal thickening +----------+--------+--------+--------+--------+------------------+ CCA Distal47      12                      intimal thickening +----------+--------+--------+--------+--------+------------------+ ICA Prox  35      13      1-39%   calcific                   +----------+--------+--------+--------+--------+------------------+ ICA Distal43      14                                         +----------+--------+--------+--------+--------+------------------+ ECA       37      1                                          +----------+--------+--------+--------+--------+------------------+ +----------+--------+--------+----------------+------------+ SubclavianPSV cm/sEDV cm/sDescribe        Arm Pressure +----------+--------+--------+----------------+------------+           26              Multiphasic, WNL             +----------+--------+--------+----------------+------------+ +---------+--------+--+--------+-+---------+ VertebralPSV cm/s26EDV cm/s8Antegrade +---------+--------+--+--------+-+---------+  ABI Findings: +--------+------------------+-----+----------+--------+ Right   Rt Pressure (mmHg)IndexWaveform  Comment  +--------+------------------+-----+----------+--------+ [id number]                    triphasic          +--------+------------------+-----+----------+--------+ PTA     255               1.72 monophasic         +--------+------------------+-----+----------+--------+ DP      255               1.72 monophasic          +--------+------------------+-----+----------+--------+ +--------+------------------+-----+----------+-----------+ Left    Lt Pressure (mmHg)IndexWaveform  Comment     +--------+------------------+-----+----------+-----------+ Brachial                                 A-V fistula +--------+------------------+-----+----------+-----------+ PTA     255               1.72 monophasic            +--------+------------------+-----+----------+-----------+ DP      148               1.00 monophasic            +--------+------------------+-----+----------+-----------+ +-------+---------------+----------------+ ABI/TBIToday's ABI/TBIPrevious ABI/TBI +-------+---------------+----------------+ Right  [place]                              +-------+---------------+----------------+  Left   1.0                             +-------+---------------+----------------+  Right Doppler Findings: +--------+--------+-----+---------+--------+ Site    PressureIndexDoppler  Comments +--------+--------+-----+---------+--------+ [id number]          triphasic         +--------+--------+-----+---------+--------+ Radial               triphasic         +--------+--------+-----+---------+--------+ Ulnar                triphasic         +--------+--------+-----+---------+--------+  Left Doppler Findings: +--------+--------+-----+--------+-----------+ Site    PressureIndexDoppler Comments    +--------+--------+-----+--------+-----------+ Brachial                     A-V fistula +--------+--------+-----+--------+-----------+ Radial               biphasic            +--------+--------+-----+--------+-----------+  Summary: Right Carotid: Velocities in the right ICA are consistent with a 1-39% stenosis. Left Carotid: Velocities in the left ICA are consistent with a 1-39% stenosis. Vertebrals:  Bilateral vertebral arteries demonstrate antegrade flow. Subclavians: Normal flow hemodynamics  were seen in bilateral subclavian              arteries. Right ABI: Resting right ankle-brachial index indicates noncompressible right lower extremity arteries. ABIs are unreliable. Left ABI: Resting left ankle-brachial index is within normal range. No evidence of significant left lower extremity arterial disease. ABIs are unreliable. Right Upper Extremity: Doppler waveform obliterate with right radial compression. Doppler waveforms remain within normal limits with right ulnar compression.  Electronically signed by Jamelle Haring on 07/01/2020 at 7:08:34 PM.    Final      Medications:     Scheduled Medications: . acetaminophen  1,000 mg Oral Q8H  . aspirin  81 mg Oral Daily  . atorvastatin  80 mg Oral Daily  . carvedilol  3.125 mg Oral BID WC  . Chlorhexidine Gluconate Cloth  6 each Topical Q0600  . Chlorhexidine Gluconate Cloth  6 each Topical Q0600  . hydrALAZINE  25 mg Oral Q8H  . isosorbide mononitrate  30 mg Oral Daily  . lanthanum  4,000 mg Oral TID WC  . lidocaine  1 patch Transdermal Q24H  . multivitamin  1 tablet Oral Q M,W,F  . mupirocin ointment  1 application Nasal BID  . pantoprazole  40 mg Oral Daily  . sodium chloride flush  3 mL Intravenous Q12H  . sodium chloride flush  3 mL Intravenous Q12H    Infusions: . sodium chloride    . sodium chloride 10 mL/hr at 07/01/20 1348    PRN Medications: sodium chloride, albuterol, cyclobenzaprine, diclofenac Sodium, HYDROcodone-acetaminophen, hydrOXYzine, lidocaine (PF), ondansetron (ZOFRAN) IV, polyethylene glycol, sodium chloride flush    Assessment/Plan   1. CAD: Patient with prior history of PCI to LAD in 11/20.  Says he had 2 stents prior to this as well.  Presentation seems more likely CHF exacerbation.  HS-TnI was mildly elevated (peak 302) without a trend, seems more likely to be demand ischemia due to volume overload than actual ACS.  ECG unchanged (RBBB-like IVCD).  LHC showed a tight eccentric/calcified 80% distal left  main stenosis.  He has not had viability study but does not have thinning/scarring and has some thickening of all walls, so suspect significant  viable tissue.  - Discussed with Dr. Orvan Seen. He is felt to be too high risk for CABG/SAVR. Palliative care has been consulted.      - Continue ASA and high dose statin.  - Heparin gtt currently off due to drop in platelets (do not think he had ACS as presentation, suspect CHF). 2. Acute on chronic systolic CHF: Echo in past (11/20) with EF 35-40% range, suspected ischemic cardiomyopathy.  Echo now with EF down to 25% on my review with mild-moderate RV dysfunction.  Cardiac index on RHC 2.3 by Fick and 2.1 by thermodilution.  Volume controlled by HD.  BP generally runs high.  - Volume control via HD, nephrology managing.  - Continue low dose Coreg 3.125 mg bid.  - Would avoid clonidine with cardiomyopathy.  Also may not tolerate ACEI/ARB/ARNI as it appears that he has a tendency towards periodic hyperkalemia despite HD (can discuss with renal however).  For now, I have him on hydralazine 25 mg tid and Imdur 30 mg daily to replace clonidine.  - Per above, not a candidate for CABG - Long-term, if EF does not improve will need to consider ICD.  With wide IVCD, he may be CRT candidate (though looks more RBBB-like).  3. Valvular heart disease: Echo with AoV mean gradient 15 mmHg but calculated AVA 0.7 cm^2.  No significant pullback gradient across the valve on cath. TEE this admit showed moderate-severe aortic stenosis (may be more moderate based on ease of crossing at time of cath), moderate mitral regurgitation, moderate tricuspid regurgitation, small PFO. - per CT surgery, not a suitable candidate for surgical valve replacement   - will consult structural heart team to assess candidacy for TAVR  4. ESRD: Since 2013, patients states it was started due to HTN.  Has not been transplant candidate due to his cardiac disease.  - HD per nephrology  5. HTN: Med changes  as above.  Would like to avoid clonidine.  6. Thrombocytopenia: Platelets dropped from 202 at admission to 111 today.  He was placed on heparin gtt on admission. Discussed with pharmacy, think drop too quick for HIT but will need to follow closely. HIT panel pending. Heparin gtt currently off (do not think he had ACS as presentation, suspect CHF). 7. Active smoker: Needs to quit.  Has nicotine patch.  8. Skin lesions: He says that these small ulcerations have been present for months.  He has had antibiotic courses through ID in [facility] it sounds like.  Last blood cultures from his HD clinic were negative per patient.  Etiology unclear. 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   - ID following - punch biopsy performed by general surgery 11/4. Pathology pending.  9. Foot pain - ? Gout. Uric acid level pending - ID following and recommending orthopedic evaluation. ? Osteomyelitis. Will consult ortho    Length of Stay: [id number], PA-C  07/02/2020, 7:08 AM  Advanced Heart Failure Team Pager [phone] (M-F; 7a - 4p)  Please contact [facility] for night-coverage after hours (4p -7a ) and weekends on [email or web address]  Patient seen with PA, agree with the above note.   Stable this morning.  Main complaint is bilateral foot pain.  Had punch biopsy yesterday of skin lesions, awaiting report.  No dyspnea or chest pain.   Plts lower this morning at 111, he is now off heparin.   General: NAD Neck: No JVD, no thyromegaly or thyroid nodule.  Lungs: Clear to auscultation bilaterally with normal  respiratory effort. CV: Nondisplaced PMI.  Heart regular S1/S2, no S3/S4, no murmur.  1+ ankle edema.    Abdomen: Soft, nontender, no hepatosplenomegaly, no distention.  Skin: Diffuse small ulcerations.  Neurologic: Alert and oriented x 3.  Psych: Normal affect. Extremities: No clubbing or cyanosis. Feet red, tender.   HEENT: Normal.   Long discussion with patient and father today  regarding surgery.  I agree with Dr. Orvan Seen that patient appears to be at prohibitive risk for CABG-SAVR at this time.  He is on dialysis with moderate RV dysfunction and LV EF in the 25% range.  He has multiple skin lesions with concern for active infection.  I reviewed his cath films and TEE with Dr. Burt Knack.  He has an 80% calcified distal LM stenosis with good coronary flow.  This would be a difficult target for PCI, and with normal flow at rest down his coronaries, unlikely to significantly improve his LV function.  Similarly, his AS is moderate-severe, not critical. I do not think that it caused his cardiomyopathy, would not challenge with dobutamine stress echo with left main disease.  I suspect that he had pre-existing low EF from prior MI (11/20).  Again, TAVR in setting of significant left main stenosis and possible diffuse cutaneous infection would be high risk.  - I have asked Dr. Burt Knack to see the patient and discuss LM PCI/TAVR concerns.  - ID following skin lesions, awaiting biopsy results.  - Will have orthopedics come by to see his feet and will send uric acid.  ?Gout.  - Increase Coreg to 6.25 mg bid and continue hydralazine/Imdur.   With low plts, HIT antibody still pending.  He is off heparin products, use SCDs for DVT prophylaxis.   Loralie Champagne 07/02/2020 10:01 AM

## 2020-07-02 NOTE — Consult Note (Addendum)
Reason for Consult:Foot pain Referring Physician: B Marco Arnold is an 54 y.o. male.  HPI: Marco Arnold has been having bilateral foot pain for about 10d. It started out of the blue and was associated with foot swelling. Since then it has only gotten worse. The pain is constant but is worse if he stands or puts shoes on. He describes it as pins and needles and also like he has sand in his joints when he moves his ankle. He denies any prior e/o or hx/o gout. He was admitted 4d ago with a CHF exacerbation.   Past Medical History:  Diagnosis Date  . Arthritis    spine  . Chronic kidney disease    [place] MWF  . Coronary artery disease   . Dialysis patient [facility]) 2013  . Diverticulitis   . Hyperlipidemia   . Hypertension    dr  Tobie Lords      @ [facility]  med  . Neuromuscular disorder ([place])    nerve pain - tx w/cymbalta  . Neuropathic pain 09/20/2017   Left mid-thoracic    Past Surgical History:  Procedure Laterality Date  . AV FISTULA PLACEMENT  11/10/2011   Procedure: ARTERIOVENOUS (AV) FISTULA CREATION;  Surgeon: Mal Misty, MD;  Location: [place];  Service: Vascular;  Laterality: Left;  Ultrasound guided  . CARDIAC CATHETERIZATION  09/19/2018   On stent study at [facility]  . COLONOSCOPY    . INSERTION OF DIALYSIS CATHETER  02/09/2012   Procedure: INSERTION OF DIALYSIS CATHETER;  Surgeon: Mal Misty, MD;  Location: [place];  Service: Vascular;  Laterality: N/A;  . MASS EXCISION Right 09/07/2018   Procedure: EXCISION MASS RIGHT THUMB;  Surgeon: Charlotte Crumb, MD;  Location: [place];  Service: Orthopedics;  Laterality: Right;  OR AXILLARY BLOCK  . REVISON OF ARTERIOVENOUS FISTULA Left 04/08/2019   Procedure: REVISION PLICATION OF LEFT ARM ARTERIOVENOUS FISTULA;  Surgeon: Serafina Mitchell, MD;  Location: [place];  Service: Vascular;  Laterality: Left;  . RIGHT/LEFT HEART CATH AND CORONARY ANGIOGRAPHY N/A 06/28/2020   Procedure: RIGHT/LEFT HEART CATH AND CORONARY ANGIOGRAPHY;   Surgeon: Lorretta Harp, MD;  Location: [place] CV LAB;  Service: Cardiovascular;  Laterality: N/A;  . SHUNTOGRAM Left 06/03/2012   Procedure: Earney Mallet;  Surgeon: Conrad Shamrock [place], MD;  Location: [facility] CATH LAB;  Service: Cardiovascular;  Laterality: Left;  . SHUNTOGRAM Left 01/02/2013   Procedure: SHUNTOGRAM;  Surgeon: Conrad Goltry, MD;  Location: [facility] CATH LAB;  Service: Cardiovascular;  Laterality: Left;    Family History  Problem Relation Age of Onset  . Pancreatic cancer Mother   . Diabetes Father   . Hypertension Father   . Cancer - Lung Paternal Grandfather     Social History:  reports that he has been smoking cigarettes. He has a 10.75 pack-year smoking history. He has never used smokeless tobacco. He reports current alcohol use. He reports current drug use. Drug: Marijuana.  Allergies:  Allergies  Allergen Reactions  . Lisinopril Shortness Of Breath    Unable to breath  . Heparin Other (See Comments)    11/4 HIT panel pending    Medications: I have reviewed the patient's current medications.  Results for orders placed or performed during the hospital encounter of 06/27/20 (from the past 48 hour(s))  Heparin level (unfractionated)     Status: None   Collection Time: 07/01/20  3:08 AM  Result Value Ref Range   Heparin Unfractionated 0.50 0.30 - 0.70 IU/mL  Comment: (NOTE) If heparin results are below expected values, and patient dosage has  been confirmed, suggest follow up testing of antithrombin III levels. Performed at [facility], [place] [address] [place], [place] [address]   CBC with Differential/Platelet     Status: Abnormal   Collection Time: 07/01/20  3:08 AM  Result Value Ref Range   WBC 9.4 4.0 - 10.5 K/uL   RBC 5.05 4.22 - 5.81 MIL/uL   Hemoglobin 14.2 13.0 - 17.0 g/dL   HCT 44.0 39 - 52 %   MCV 87.1 80.0 - 100.0 fL   MCH 28.1 26.0 - 34.0 pg   MCHC 32.3 30.0 - 36.0 g/dL   RDW 16.9 (H) 11.5 - 15.5 %   Platelets 123 (L) 150 - 400 K/uL   nRBC 0.0  0.0 - 0.2 %   Neutrophils Relative % 68 %   Neutro Abs 6.6 1.7 - 7.7 K/uL   Lymphocytes Relative 9 %   Lymphs Abs 0.8 0.7 - 4.0 K/uL   Monocytes Relative 10 %   Monocytes Absolute 0.9 0.1 - 1.0 K/uL   Eosinophils Relative 11 %   Eosinophils Absolute 1.0 (H) 0.0 - 0.5 K/uL   Basophils Relative 1 %   Basophils Absolute 0.1 0.0 - 0.1 K/uL   Immature Granulocytes 1 %   Abs Immature Granulocytes 0.06 0.00 - 0.07 K/uL    Comment: Performed at [facility] [address] [place], [place] [address]  Basic metabolic panel     Status: Abnormal   Collection Time: 07/01/20  3:08 AM  Result Value Ref Range   Sodium 132 (L) 135 - 145 mmol/L   Potassium 4.2 3.5 - 5.1 mmol/L   Chloride 93 (L) 98 - 111 mmol/L   CO2 24 22 - 32 mmol/L   Glucose, Bld 112 (H) 70 - 99 mg/dL    Comment: Glucose reference range applies only to samples taken after fasting for at least 8 hours.   BUN 47 (H) 6 - 20 mg/dL   Creatinine, Ser 7.06 (H) 0.61 - 1.24 mg/dL   Calcium 8.8 (L) 8.9 - 10.3 mg/dL   GFR, Estimated 9 (L) >60 mL/min    Comment: (NOTE) Calculated using the CKD-EPI Creatinine Equation (2021)    Anion gap 15 5 - 15    Comment: Performed at [facility] [address] [place], [place] [address]  Glucose, capillary     Status: Abnormal   Collection Time: 07/01/20  6:40 AM  Result Value Ref Range   Glucose-Capillary 122 (H) 70 - 99 mg/dL    Comment: Glucose reference range applies only to samples taken after fasting for at least 8 hours.  Aerobic/Anaerobic Culture (surgical/deep wound)     Status: None (Preliminary result)   Collection Time: 07/01/20  1:16 PM   Specimen: Tissue  Result Value Ref Range   Specimen Description TISSUE SKIN BIOPSY    Special Requests NONE    Gram Stain      RARE WBC PRESENT, PREDOMINANTLY MONONUCLEAR RARE GRAM POSITIVE COCCI    Culture      FEW STAPHYLOCOCCUS AUREUS SUSCEPTIBILITIES TO FOLLOW Performed at [facility], [address]  [place], [place] [address]    Report Status PENDING   Surgical pcr screen     Status: Abnormal   Collection Time: 07/01/20  4:49 PM   Specimen: Nasal Mucosa; Nasal Swab  Result Value Ref Range   MRSA, PCR POSITIVE (A) NEGATIVE  Comment: RESULT CALLED TO, READ BACK BY AND VERIFIED WITH: Gwen Her RN 07/01/20 AT 1945 SK    Staphylococcus aureus POSITIVE (A) NEGATIVE    Comment: RESULT CALLED TO, READ BACK BY AND VERIFIED WITH: Gwen Her RN 07/01/20 AT 1945 SK (NOTE) The Xpert SA Assay (FDA approved for NASAL specimens in patients 49 years of age and older), is one component of a comprehensive surveillance program. It is not intended to diagnose infection nor to guide or monitor treatment. Performed at [facility], [place] [address] [place], [place] [address]   CBC with Differential/Platelet     Status: Abnormal   Collection Time: 07/02/20  1:56 AM  Result Value Ref Range   WBC 8.9 4.0 - 10.5 K/uL   RBC 4.73 4.22 - 5.81 MIL/uL   Hemoglobin 13.1 13.0 - 17.0 g/dL   HCT 41.2 39 - 52 %   MCV 87.1 80.0 - 100.0 fL   MCH 27.7 26.0 - 34.0 pg   MCHC 31.8 30.0 - 36.0 g/dL   RDW 16.8 (H) 11.5 - 15.5 %   Platelets 111 (L) 150 - 400 K/uL    Comment: REPEATED TO VERIFY PLATELET COUNT CONFIRMED BY SMEAR SPECIMEN CHECKED FOR CLOTS Immature Platelet Fraction may be clinically indicated, consider ordering this additional test [id number]    nRBC 0.0 0.0 - 0.2 %   Neutrophils Relative % 74 %   Neutro Abs 6.6 1.7 - 7.7 K/uL   Lymphocytes Relative 5 %   Lymphs Abs 0.5 (L) 0.7 - 4.0 K/uL   Monocytes Relative 14 %   Monocytes Absolute 1.2 (H) 0.1 - 1.0 K/uL   Eosinophils Relative 6 %   Eosinophils Absolute 0.6 (H) 0.0 - 0.5 K/uL   Basophils Relative 1 %   Basophils Absolute 0.0 0.0 - 0.1 K/uL   Immature Granulocytes 0 %   Abs Immature Granulocytes 0.03 0.00 - 0.07 K/uL    Comment: Performed at [facility], [place] [address], [place], [place] [address]  Basic metabolic panel     Status:  Abnormal   Collection Time: 07/02/20  1:56 AM  Result Value Ref Range   Sodium 130 (L) 135 - 145 mmol/L   Potassium 3.8 3.5 - 5.1 mmol/L   Chloride 90 (L) 98 - 111 mmol/L   CO2 23 22 - 32 mmol/L   Glucose, Bld 117 (H) 70 - 99 mg/dL    Comment: Glucose reference range applies only to samples taken after fasting for at least 8 hours.   BUN 34 (H) 6 - 20 mg/dL   Creatinine, Ser 5.87 (H) 0.61 - 1.24 mg/dL   Calcium 9.2 8.9 - 10.3 mg/dL   GFR, Estimated 11 (L) >60 mL/min    Comment: (NOTE) Calculated using the CKD-EPI Creatinine Equation (2021)    Anion gap 17 (H) 5 - 15    Comment: Performed at [facility] [address], [place], [place] [address]  Uric acid     Status: None   Collection Time: 07/02/20  9:33 AM  Result Value Ref Range   Uric Acid, Serum 5.2 3.7 - 8.6 mg/dL    Comment: Performed at [facility] [address], [place],  [address]    CT chest without contrast  Result Date: 06/30/2020 CLINICAL DATA:  Thoracic aortic disease, preoperative planning EXAM: CT CHEST WITHOUT CONTRAST TECHNIQUE: Multidetector CT imaging of the chest was performed following the standard protocol without IV contrast. COMPARISON:  07/09/2018 FINDINGS: Cardiovascular:  Densely calcified coronary arteries. Moderate aortic calcifications. No aneurysm. Maximum aortic diameter 3.6 cm in the ascending thoracic aorta. Densely calcified mitral valve annulus. Mediastinum/Nodes: No mediastinal, hilar, or axillary adenopathy. Trachea and esophagus are unremarkable. Thyroid unremarkable. Lungs/Pleura: Lungs are clear. No focal airspace opacities or suspicious nodules. No effusions. Upper Abdomen: Imaging into the upper abdomen demonstrates no acute findings. Severely atrophic kidneys bilaterally. Musculoskeletal: Chest wall soft tissues are unremarkable. No acute bony abnormality. IMPRESSION: No evidence of aortic aneurysm. Heavily calcified coronary arteries and mitral valve annulus. Aortic  Atherosclerosis (ICD10-I70.0). Electronically Signed   By: Rolm Baptise M.D.   On: 06/30/2020 22:03   ECHO TEE  Result Date: 07/01/2020    TRANSESOPHOGEAL ECHO REPORT   Patient Name:   Marco Arnold Date of Exam: 07/01/2020 Medical Rec #:  [id number]     Height:       75.5 in Accession #:    [id number]    Weight:       216.5 lb Date of Birth:  12-22-1965     BSA:          2.281 m Patient Age:    49 years      BP:           106/88 mmHg Patient Gender: M             HR:           68 bpm. Exam Location:  Inpatient Procedure: Transesophageal Echo, Cardiac Doppler and Color Doppler Indications:     I35.0 Nonrheumatic aortic (valve) stenosis  History:         Patient has prior history of Echocardiogram examinations, most                  recent 06/27/2020. CAD, Mitral Valve Disease and Aortic Valve                  Disease; Risk Factors:Hypertension and Dyslipidemia. CKD.  Sonographer:     Jonelle Sidle Dance Referring Phys:  [place] Diagnosing Phys: Loralie Champagne MD PROCEDURE: The transesophogeal probe was passed without difficulty through the esophogus of the patient. Local oropharyngeal anesthetic was provided with Cetacaine. Sedation performed by different physician. The patient was monitored while under deep sedation. Anesthestetic sedation was provided intravenously by Anesthesiology: 177.48mg  of Propofol, 100mg  of Lidocaine. The patient developed no complications during the procedure. IMPRESSIONS  1. Left ventricular ejection fraction, by estimation, is 20 to 25%. The left ventricle has severely decreased function. The left ventricle demonstrates global hypokinesis. The left ventricular internal cavity size was mildly dilated. There is mild left ventricular hypertrophy.  2. Right ventricular systolic function is moderately reduced. The right ventricular size is mildly enlarged.  3. Left atrial size was moderately dilated. No left atrial/left atrial appendage thrombus was detected.  4. Right atrial size was  moderately dilated.  5. The mitral valve is normal in structure. Moderate mitral valve regurgitation. No evidence of mitral stenosis.  6. Peak RV-RA gradient 29 mmHg. Tricuspid valve regurgitation is moderate.  7. There was moderate to severe aortic stenosis with mean gradient 24 mmHg, AVA 0.84 cm^2 by continuity equation and 1.2 cm^2 by planimetry. The aortic valve is tricuspid. Aortic valve regurgitation is not visualized.  8. Small PFO noted by color doppler.  9. Normal caliber thoracic aorta with mild plaque. FINDINGS  Left Ventricle: Left ventricular ejection fraction, by estimation, is 20 to 25%. The left ventricle has severely decreased function. The left ventricle demonstrates  global hypokinesis. The left ventricular internal cavity size was mildly dilated. There is mild left ventricular hypertrophy. Right Ventricle: The right ventricular size is mildly enlarged. No increase in right ventricular wall thickness. Right ventricular systolic function is moderately reduced. Left Atrium: Left atrial size was moderately dilated. No left atrial/left atrial appendage thrombus was detected. Right Atrium: Right atrial size was moderately dilated. Pericardium: Trivial pericardial effusion is present. Mitral Valve: The mitral valve is normal in structure. Moderate mitral valve regurgitation. No evidence of mitral valve stenosis. Tricuspid Valve: Peak RV-RA gradient 29 mmHg. The tricuspid valve is normal in structure. Tricuspid valve regurgitation is moderate. Aortic Valve: There was moderate to severe aortic stenosis with mean gradient 24 mmHg, AVA 0.84 cm^2 by continuity equation and 1.2 cm^2 by planimetry. The aortic valve is tricuspid. Aortic valve regurgitation is not visualized. Aortic valve mean gradient measures 22.0 mmHg. Aortic valve peak gradient measures 35.5 mmHg. Aortic valve area, by VTI measures 0.87 cm. Pulmonic Valve: The pulmonic valve was normal in structure. Pulmonic valve regurgitation is not  visualized. Aorta: Normal caliber thoracic aorta with mild plaque. and the aortic arch was not well visualized. IAS/Shunts: Small PFO noted by color doppler.  LEFT VENTRICLE PLAX 2D LVOT diam:     2.10 cm LV SV:         49 LV SV Index:   21 LVOT Area:     3.46 cm  AORTIC VALVE AV Area (Vmax):    0.95 cm AV Area (Vmean):   0.80 cm AV Area (VTI):     0.87 cm AV Vmax:           298.00 cm/s AV Vmean:          219.000 cm/s AV VTI:            0.564 m AV Peak Grad:      35.5 mmHg AV Mean Grad:      22.0 mmHg LVOT Vmax:         81.50 cm/s LVOT Vmean:        50.400 cm/s LVOT VTI:          0.141 m LVOT/AV VTI ratio: 0.25 TRICUSPID VALVE TR Peak grad:   27.5 mmHg TR Vmax:        262.00 cm/s  SHUNTS Systemic VTI:  0.14 m Systemic Diam: 2.10 cm Loralie Champagne MD Electronically signed by Loralie Champagne MD Signature Date/Time: 07/01/2020/8:56:00 PM    Final    VAS US DOPPLER PRE CABG  Result Date: 07/01/2020 PREOPERATIVE VASCULAR EVALUATION  Risk Factors:     Hypertension, current smoker, coronary artery disease. Other Factors:    ESRD, on dialysis, aortic and mitral valve disease. Comparison Study: No prior study Performing Technologist: Sharion Dove RVS Supporting Technologist: Abram Sander RVS, Darlin Coco RDMS  Examination Guidelines: A complete evaluation includes B-mode imaging, spectral Doppler, color Doppler, and power Doppler as needed of all accessible portions of each vessel. Bilateral testing is considered an integral part of a complete examination. Limited examinations for reoccurring indications may be performed as noted.  Right Carotid Findings: +----------+--------+--------+--------+--------+------------------+           PSV cm/sEDV cm/sStenosisDescribeComments           +----------+--------+--------+--------+--------+------------------+ CCA Prox  44      7                       intimal thickening +----------+--------+--------+--------+--------+------------------+ CCA Distal30      7  intimal thickening +----------+--------+--------+--------+--------+------------------+ ICA Prox  34      13      1-39%   calcific                   +----------+--------+--------+--------+--------+------------------+ ICA Distal44      16                                         +----------+--------+--------+--------+--------+------------------+ ECA       64      6                                          +----------+--------+--------+--------+--------+------------------+ Portions of this table do not appear on this page. +----------+--------+-------+----------------+------------+           PSV cm/sEDV cmsDescribe        Arm Pressure +----------+--------+-------+----------------+------------+ [id number]             Multiphasic, WNL             +----------+--------+-------+----------------+------------+ +---------+--------+--+--------+--+---------+ VertebralPSV cm/s38EDV cm/s12Antegrade +---------+--------+--+--------+--+---------+ Left Carotid Findings: +----------+--------+--------+--------+--------+------------------+           PSV cm/sEDV cm/sStenosisDescribeComments           +----------+--------+--------+--------+--------+------------------+ CCA Prox  35      13                      intimal thickening +----------+--------+--------+--------+--------+------------------+ CCA Distal47      12                      intimal thickening +----------+--------+--------+--------+--------+------------------+ ICA Prox  35      13      1-39%   calcific                   +----------+--------+--------+--------+--------+------------------+ ICA Distal43      14                                         +----------+--------+--------+--------+--------+------------------+ ECA       37      1                                          +----------+--------+--------+--------+--------+------------------+  +----------+--------+--------+----------------+------------+ SubclavianPSV cm/sEDV cm/sDescribe        Arm Pressure +----------+--------+--------+----------------+------------+           26              Multiphasic, WNL             +----------+--------+--------+----------------+------------+ +---------+--------+--+--------+-+---------+ VertebralPSV cm/s26EDV cm/s8Antegrade +---------+--------+--+--------+-+---------+  ABI Findings: +--------+------------------+-----+----------+--------+ Right   Rt Pressure (mmHg)IndexWaveform  Comment  +--------+------------------+-----+----------+--------+ [id number]                    triphasic          +--------+------------------+-----+----------+--------+ PTA     255               1.72 monophasic         +--------+------------------+-----+----------+--------+ DP      255  1.72 monophasic         +--------+------------------+-----+----------+--------+ +--------+------------------+-----+----------+-----------+ Left    Lt Pressure (mmHg)IndexWaveform  Comment     +--------+------------------+-----+----------+-----------+ Brachial                                 A-V fistula +--------+------------------+-----+----------+-----------+ PTA     255               1.72 monophasic            +--------+------------------+-----+----------+-----------+ DP      148               1.00 monophasic            +--------+------------------+-----+----------+-----------+ +-------+---------------+----------------+ ABI/TBIToday's ABI/TBIPrevious ABI/TBI +-------+---------------+----------------+ Right  [place]                              +-------+---------------+----------------+ Left   1.0                             +-------+---------------+----------------+  Right Doppler Findings: +--------+--------+-----+---------+--------+ Site    PressureIndexDoppler  Comments  +--------+--------+-----+---------+--------+ [id number]          triphasic         +--------+--------+-----+---------+--------+ Radial               triphasic         +--------+--------+-----+---------+--------+ Ulnar                triphasic         +--------+--------+-----+---------+--------+  Left Doppler Findings: +--------+--------+-----+--------+-----------+ Site    PressureIndexDoppler Comments    +--------+--------+-----+--------+-----------+ Brachial                     A-V fistula +--------+--------+-----+--------+-----------+ Radial               biphasic            +--------+--------+-----+--------+-----------+  Summary: Right Carotid: Velocities in the right ICA are consistent with a 1-39% stenosis. Left Carotid: Velocities in the left ICA are consistent with a 1-39% stenosis. Vertebrals:  Bilateral vertebral arteries demonstrate antegrade flow. Subclavians: Normal flow hemodynamics were seen in bilateral subclavian              arteries. Right ABI: Resting right ankle-brachial index indicates noncompressible right lower extremity arteries. ABIs are unreliable. Left ABI: Resting left ankle-brachial index is within normal range. No evidence of significant left lower extremity arterial disease. ABIs are unreliable. Right Upper Extremity: Doppler waveform obliterate with right radial compression. Doppler waveforms remain within normal limits with right ulnar compression.  Electronically signed by Jamelle Haring on 07/01/2020 at 7:08:34 PM.    Final     Review of Systems  HENT: Negative for ear discharge, ear pain, hearing loss and tinnitus.   Eyes: Negative for photophobia and pain.  Respiratory: Negative for cough and shortness of breath.   Cardiovascular: Negative for chest pain.  Gastrointestinal: Negative for abdominal pain, nausea and vomiting.  Genitourinary: Negative for dysuria, flank pain, frequency and urgency.  Musculoskeletal: Positive for joint  swelling (Bilateral feet) and myalgias (Bilateral feet). Negative for back pain and neck pain.  Skin: Positive for rash.  Neurological: Negative for dizziness and headaches.  Hematological: Does not bruise/bleed easily.  Psychiatric/Behavioral: The patient is not nervous/anxious.    Blood pressure 128/87, pulse 78,  temperature 97.8 F (36.6 C), temperature source Oral, resp. rate 20, height 6' 3.5" (1.918 m), weight 99.7 kg, SpO2 100 %. Physical Exam Constitutional:      General: He is not in acute distress.    Appearance: He is well-developed. He is not diaphoretic.  HENT:     Head: Normocephalic and atraumatic.  Eyes:     General: No scleral icterus.       Right eye: No discharge.        Left eye: No discharge.     Conjunctiva/sclera: Conjunctivae normal.  Cardiovascular:     Rate and Rhythm: Normal rate and regular rhythm.  Pulmonary:     Effort: Pulmonary effort is normal. No respiratory distress.  Musculoskeletal:     Cervical back: Normal range of motion.  Feet:     Comments: Bilateral feet: 3+ pitting edema, mod TTP diffuse feet/ankles, dorsum>plantar, R>L. No obvious ankle effusion, no joint erythema, excellent ankle ROM, multiple scattered lesions, 1+ DP/PT Skin:    General: Skin is warm and dry.  Neurological:     Mental Status: He is alert.  Psychiatric:        Behavior: Behavior normal.     Assessment/Plan: Bilateral foot pain -- Given description of pain and bilaterality would suspect some sort of neuropathic component, possibly brought on by his swelling. Do not suspect gout given normal uric acid and lack of history and presentation. Do not suspect septic arthritis. Would consider trying compression stockings and aggressive edema control and possibly neurology consultation.    Lisette Abu, PA-C Orthopedic Surgery [phone] 07/02/2020, 11:05 AM

## 2020-07-02 NOTE — Progress Notes (Signed)
Family Medicine Teaching Service Daily Progress Note Intern Pager: [phone]  Patient name: Marco Arnold Medical record number: [id number] Date of birth: April 28, 1966 Age: 54 y.o. Gender: male  Primary Care Provider: Cyndi Bender, PA-C Consultants: Cardiology  Code Status: Full  Pt Overview and Major Events to Date:  Admitted 10/31  Assessment and Plan: Marco Arnold a 54 y.o.malesmokerpresenting withchest tightness and dyspnea likely due to CHF vs ACS. PMH is significant forESRD, CAD s/p PCI of LAD (06/2019), HTN, ASandchronic low back pain.  Chest tightness  ACSAcute on chronic combined systolic and diastolic HF Denies chest pain. TEE notable for EF 20-25% with severely decreased function of LV. Moderate mitral and tricuspid valve regurgitation. Moderate to severe aortic stenosis. Patient being followed by both HF team and CVTS.  Was able to touch base with Dr. Orvan Seen who confirmed that CABG will not be performed, recommended palliative consult. Appreciate continued assistance and recommendations from both HF team and CVTS.  - continue ASA 81 mg - continue atorvastatin 80 mg -continuecarvedilol3.125 mg bid - cardiac monitoring - regular diet   ESRD Chronic. HD MWF schedule.Likely secondary to hypertensive renal disease.Most recent labs notable forCr5.87, BUN34and GFR 11.Avoid morphine for pain control due to renal function. - nephrologyfollowing - continue Rena-Vit, lanthanum  Thrombocytopenia Platelet count downtrending, 111 this morning. Possibly related to heparin starting during this hospitalization. -heparin discontinued -pending HIT assay -discussed with pharmacy team, looking into potential alternatives, appreciate involvement and recommendations   HTN Mildly hypertensive this morning with BP 142/90.  -continuecarvedilol, increase dosage as needed -continue to monitor  Chronic low back pain Stable and chronic. Given ESRD status,  dilautid is preferred but patient states that it has not worked to relieve pain as well in the past. - continue cyclobenzaprine -scheduled tylenol 1g q8h  - norco q12prn - avoid morphine given ESRD status  GERD On pantoprazole 40 mg daily. - continueprotonix  Tobacco use disorder Smokes about 1ppd. Started smoking when he was 54 yo. -continue toencourage tobacco cessation  Prediabetes  Stable. A1c 6.1. Blood glucose this morning 117. No home medications. -diet and exercise counseling, lifestyle modifications - f/u with PCP   FEN/GI: regular diet  PPx: heparin      Disposition: inpatient         Subjective:  No acute overnight events reported. Patient denies chest pain.   Objective: Temp:  [97.7 F (36.5 C)-98.4 F (36.9 C)] 98.2 F (36.8 C) (11/05 0537) Pulse Rate:  [67-76] 76 (11/05 0537) Resp:  [12-22] 18 (11/05 0537) BP: (104-159)/(58-105) 142/90 (11/05 0537) SpO2:  [96 %-100 %] 98 % (11/05 0537) Weight:  [98.2 kg-101.2 kg] 99.7 kg (11/05 0537) Physical Exam: General: Patient sitting upright, in no acute distress. Cardiovascular: RRR, no murmurs or gallops auscultated Respiratory: lungs clear to auscultation bilaterally, breathing comfortably on room air Abdomen: soft, nontender, presence of active bowel sounds Extremities: radial and distal pulses strong and equal bilaterally, 1+ pitting LE edema bilaterally, mild pedal edema bilaterally Derm: chronic, healing lesions located systemically   Laboratory: Recent Labs  Lab 06/30/20 0226 07/01/20 0308 07/02/20 0156  WBC 11.1* 9.4 8.9  HGB 13.9 14.2 13.1  HCT 43.4 44.0 41.2  PLT 149* 123* 111*   Recent Labs  Lab 06/30/20 0226 06/30/20 0226 06/30/20 0659 07/01/20 0308 07/02/20 0156  NA 130*  --   --  132* 130*  K 6.6*   < > 4.2 4.2 3.8  CL 92*  --   --  93* 90*  CO2  18*  --   --  24 23  BUN 80*  --   --  47* 34*  CREATININE 8.78*  --   --  7.06* 5.87*  CALCIUM 8.4*  --   --   8.8* 9.2  GLUCOSE 122*  --   --  112* 117*   < > = values in this interval not displayed.      Imaging/Diagnostic Tests: ECHO TEE  Result Date: 07/01/2020    TRANSESOPHOGEAL ECHO REPORT   Patient Name:   Marco Arnold Date of Exam: 07/01/2020 Medical Rec #:  [id number]     Height:       75.5 in Accession #:    [id number]    Weight:       216.5 lb Date of Birth:  [birth year]     BSA:          2.281 m Patient Age:    85 years      BP:           106/88 mmHg Patient Gender: M             HR:           68 bpm. Exam Location:  Inpatient Procedure: Transesophageal Echo, Cardiac Doppler and Color Doppler Indications:     I35.0 Nonrheumatic aortic (valve) stenosis  History:         Patient has prior history of Echocardiogram examinations, most                  recent 06/27/2020. CAD, Mitral Valve Disease and Aortic Valve                  Disease; Risk Factors:Hypertension and Dyslipidemia. CKD.  Sonographer:     Jonelle Sidle Dance Referring Phys:  [place] Diagnosing Phys: Loralie Champagne MD PROCEDURE: The transesophogeal probe was passed without difficulty through the esophogus of the patient. Local oropharyngeal anesthetic was provided with Cetacaine. Sedation performed by different physician. The patient was monitored while under deep sedation. Anesthestetic sedation was provided intravenously by Anesthesiology: 177.48mg  of Propofol, 100mg  of Lidocaine. The patient developed no complications during the procedure. IMPRESSIONS  1. Left ventricular ejection fraction, by estimation, is 20 to 25%. The left ventricle has severely decreased function. The left ventricle demonstrates global hypokinesis. The left ventricular internal cavity size was mildly dilated. There is mild left ventricular hypertrophy.  2. Right ventricular systolic function is moderately reduced. The right ventricular size is mildly enlarged.  3. Left atrial size was moderately dilated. No left atrial/left atrial appendage thrombus was detected.   4. Right atrial size was moderately dilated.  5. The mitral valve is normal in structure. Moderate mitral valve regurgitation. No evidence of mitral stenosis.  6. Peak RV-RA gradient 29 mmHg. Tricuspid valve regurgitation is moderate.  7. There was moderate to severe aortic stenosis with mean gradient 24 mmHg, AVA 0.84 cm^2 by continuity equation and 1.2 cm^2 by planimetry. The aortic valve is tricuspid. Aortic valve regurgitation is not visualized.  8. Small PFO noted by color doppler.  9. Normal caliber thoracic aorta with mild plaque. FINDINGS  Left Ventricle: Left ventricular ejection fraction, by estimation, is 20 to 25%. The left ventricle has severely decreased function. The left ventricle demonstrates global hypokinesis. The left ventricular internal cavity size was mildly dilated. There is mild left ventricular hypertrophy. Right Ventricle: The right ventricular size is mildly enlarged. No increase in right ventricular wall thickness. Right ventricular systolic function is moderately  reduced. Left Atrium: Left atrial size was moderately dilated. No left atrial/left atrial appendage thrombus was detected. Right Atrium: Right atrial size was moderately dilated. Pericardium: Trivial pericardial effusion is present. Mitral Valve: The mitral valve is normal in structure. Moderate mitral valve regurgitation. No evidence of mitral valve stenosis. Tricuspid Valve: Peak RV-RA gradient 29 mmHg. The tricuspid valve is normal in structure. Tricuspid valve regurgitation is moderate. Aortic Valve: There was moderate to severe aortic stenosis with mean gradient 24 mmHg, AVA 0.84 cm^2 by continuity equation and 1.2 cm^2 by planimetry. The aortic valve is tricuspid. Aortic valve regurgitation is not visualized. Aortic valve mean gradient measures 22.0 mmHg. Aortic valve peak gradient measures 35.5 mmHg. Aortic valve area, by VTI measures 0.87 cm. Pulmonic Valve: The pulmonic valve was normal in structure. Pulmonic valve  regurgitation is not visualized. Aorta: Normal caliber thoracic aorta with mild plaque. and the aortic arch was not well visualized. IAS/Shunts: Small PFO noted by color doppler.  LEFT VENTRICLE PLAX 2D LVOT diam:     2.10 cm LV SV:         49 LV SV Index:   21 LVOT Area:     3.46 cm  AORTIC VALVE AV Area (Vmax):    0.95 cm AV Area (Vmean):   0.80 cm AV Area (VTI):     0.87 cm AV Vmax:           298.00 cm/s AV Vmean:          219.000 cm/s AV VTI:            0.564 m AV Peak Grad:      35.5 mmHg AV Mean Grad:      22.0 mmHg LVOT Vmax:         81.50 cm/s LVOT Vmean:        50.400 cm/s LVOT VTI:          0.141 m LVOT/AV VTI ratio: 0.25 TRICUSPID VALVE TR Peak grad:   27.5 mmHg TR Vmax:        262.00 cm/s  SHUNTS Systemic VTI:  0.14 m Systemic Diam: 2.10 cm Loralie Champagne MD Electronically signed by Loralie Champagne MD Signature Date/Time: 07/01/2020/8:56:00 PM    Final    VAS US DOPPLER PRE CABG  Result Date: 07/01/2020 PREOPERATIVE VASCULAR EVALUATION  Risk Factors:     Hypertension, current smoker, coronary artery disease. Other Factors:    ESRD, on dialysis, aortic and mitral valve disease. Comparison Study: No prior study Performing Technologist: Sharion Dove RVS Supporting Technologist: Abram Sander RVS, Darlin Coco RDMS  Examination Guidelines: A complete evaluation includes B-mode imaging, spectral Doppler, color Doppler, and power Doppler as needed of all accessible portions of each vessel. Bilateral testing is considered an integral part of a complete examination. Limited examinations for reoccurring indications may be performed as noted.  Right Carotid Findings: +----------+--------+--------+--------+--------+------------------+           PSV cm/sEDV cm/sStenosisDescribeComments           +----------+--------+--------+--------+--------+------------------+ CCA Prox  44      7                       intimal thickening +----------+--------+--------+--------+--------+------------------+ CCA  Distal30      7                       intimal thickening +----------+--------+--------+--------+--------+------------------+ ICA Prox  34      13      1-39%  calcific                   +----------+--------+--------+--------+--------+------------------+ ICA Distal44      16                                         +----------+--------+--------+--------+--------+------------------+ ECA       64      6                                          +----------+--------+--------+--------+--------+------------------+ Portions of this table do not appear on this page. +----------+--------+-------+----------------+------------+           PSV cm/sEDV cmsDescribe        Arm Pressure +----------+--------+-------+----------------+------------+ [id number]             Multiphasic, WNL             +----------+--------+-------+----------------+------------+ +---------+--------+--+--------+--+---------+ VertebralPSV cm/s38EDV cm/s12Antegrade +---------+--------+--+--------+--+---------+ Left Carotid Findings: +----------+--------+--------+--------+--------+------------------+           PSV cm/sEDV cm/sStenosisDescribeComments           +----------+--------+--------+--------+--------+------------------+ CCA Prox  35      13                      intimal thickening +----------+--------+--------+--------+--------+------------------+ CCA Distal47      12                      intimal thickening +----------+--------+--------+--------+--------+------------------+ ICA Prox  35      13      1-39%   calcific                   +----------+--------+--------+--------+--------+------------------+ ICA Distal43      14                                         +----------+--------+--------+--------+--------+------------------+ ECA       37      1                                          +----------+--------+--------+--------+--------+------------------+  +----------+--------+--------+----------------+------------+ SubclavianPSV cm/sEDV cm/sDescribe        Arm Pressure +----------+--------+--------+----------------+------------+           26              Multiphasic, WNL             +----------+--------+--------+----------------+------------+ +---------+--------+--+--------+-+---------+ VertebralPSV cm/s26EDV cm/s8Antegrade +---------+--------+--+--------+-+---------+  ABI Findings: +--------+------------------+-----+----------+--------+ Right   Rt Pressure (mmHg)IndexWaveform  Comment  +--------+------------------+-----+----------+--------+ [id number]                    triphasic          +--------+------------------+-----+----------+--------+ PTA     255               1.72 monophasic         +--------+------------------+-----+----------+--------+ DP      255               1.72 monophasic         +--------+------------------+-----+----------+--------+ +--------+------------------+-----+----------+-----------+ Left  Lt Pressure (mmHg)IndexWaveform  Comment     +--------+------------------+-----+----------+-----------+ Brachial                                 A-V fistula +--------+------------------+-----+----------+-----------+ PTA     255               1.72 monophasic            +--------+------------------+-----+----------+-----------+ DP      148               1.00 monophasic            +--------+------------------+-----+----------+-----------+ +-------+---------------+----------------+ ABI/TBIToday's ABI/TBIPrevious ABI/TBI +-------+---------------+----------------+ Right  [place]                              +-------+---------------+----------------+ Left   1.0                             +-------+---------------+----------------+  Right Doppler Findings: +--------+--------+-----+---------+--------+ Site    PressureIndexDoppler  Comments  +--------+--------+-----+---------+--------+ [id number]          triphasic         +--------+--------+-----+---------+--------+ Radial               triphasic         +--------+--------+-----+---------+--------+ Ulnar                triphasic         +--------+--------+-----+---------+--------+  Left Doppler Findings: +--------+--------+-----+--------+-----------+ Site    PressureIndexDoppler Comments    +--------+--------+-----+--------+-----------+ Brachial                     A-V fistula +--------+--------+-----+--------+-----------+ Radial               biphasic            +--------+--------+-----+--------+-----------+  Summary: Right Carotid: Velocities in the right ICA are consistent with a 1-39% stenosis. Left Carotid: Velocities in the left ICA are consistent with a 1-39% stenosis. Vertebrals:  Bilateral vertebral arteries demonstrate antegrade flow. Subclavians: Normal flow hemodynamics were seen in bilateral subclavian              arteries. Right ABI: Resting right ankle-brachial index indicates noncompressible right lower extremity arteries. ABIs are unreliable. Left ABI: Resting left ankle-brachial index is within normal range. No evidence of significant left lower extremity arterial disease. ABIs are unreliable. Right Upper Extremity: Doppler waveform obliterate with right radial compression. Doppler waveforms remain within normal limits with right ulnar compression.  Electronically signed by Jamelle Haring on 07/01/2020 at 7:08:34 PM.    Final     Donney Dice, DO 07/02/2020, 6:41 AM PGY-1, [facility] Intern pager: [phone], text pages welcome

## 2020-07-02 NOTE — Consult Note (Signed)
Consultation Note Date: 07/02/2020   Patient Name: Marco Arnold  DOB: [birth year]  MRN: [id number]  Age / Sex: 54 y.o., male  PCP: Marco Bender, PA-C Referring Physician: Leeanne Rio, MD  Reason for Consultation: Establishing goals of care  HPI/Patient Profile: 54 y.o. male  with past medical history of ESRD, bacteremia, skin lesions, CAD sp stenting, aortic stenosis who was admitted on 06/27/2020 with chest pain.  Diagnosis is cardiac related NSTEMI / CHF.  He had a thorough work up including a LHC and TEE.  He was found to have an LVEF of 25 - 30% with small PFO and valvular disease including moderate TR, moderate MR, and mod-severe AS.  Further he has two red swollen, painful feet and multiple skin lesions all over his body.    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  Blood cultures were not drawn this admission but his vital signs, WBC and appetite do not seem to indicate bacteremia.  He is not on antibiotics. Albumin is 3.2.  He is tolerating HD with out issue.  Biopsy was taken of the skin lesion on 11/4.  Path pending.  Clinical Assessment and Goals of Care:  I have reviewed medical records including EPIC notes, labs and imaging, received report from the bedside RN, examined the patient and sat with him attempt to discuss diagnosis prognosis, GOC, EOL wishes, disposition and options.  Initially Marco Arnold is angry.  He's been NPO for 2+ days.  He was very anxious about his surgery overnight and became angry when the RN explained he was not going to have it.  He expresses that he has friends that are in worse shape than he is (one has a VAD) and the doctors can do something for them!  He is angry about not hearing about his test results from yesterday.  He is angry that his cardio-thoracic surgery has been cancelled and he feels as though his doctor should have already talked with him about  it.  We reviewed the results of the TEE.  I explained that physicians "first do no harm".  If it is felt that a surgery may harm him it will not be done.    Patient exclaims that he is going to go home and take himself out like his grandfather did - with a gun to the head.  He goes on to say that if his feet are the problem (preventing surgery) then amputate my feet and then lets have the heart surgery.  We discussed a brief life review of the patient.  Marco Arnold is from [place] and went to [employer].  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  He now receives disability.  He lives with his father who is able to help him somewhat during the day.  He has a son and a daughter and multiple siblings.  He believes in God but does not attend church.  He love is working on [employer].  He  drops his head and tells me has to sell the two he has (as he is going to die).  He understandably has a wide range of emotions.    As far as functional and nutritional status - he is hungry and has requested double portions of breakfast.  Albumin is reasonable.  He states his painful feet keep him from walking but he can get up if he has to.  Sitting on the sit of the bed, he is alert, coherent and with clear speech.  He appears depressed but not weak.  I listened as Marco Arnold expressed his feelings and fear.  He does not want to die.  He wants to continue to work to survive.  I attempted to provide empathy and reassurance.   I committed to come back in a few hours when his father arrived.  I came back later in the day.  Marco Arnold has been seen by multiple physicians and had two meals and he is much more calm.  He is determined to keep going.    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  We discussed his very high pain tolerance.     Marco Arnold has an incredibly strong will to live.  He discusses setting up HD so that he can take a trip with his dad.   He is  making plans to take care of his personal affairs so that if he "dies in 3 months" his children won't have to do it.   Marco Arnold lives with his 69 yo father and is his best friend.  He does not want to pre-decease his father - he is very worried about the stress that would place on his Dad.    He greatly appreciated Marco Arnold and Marco Arnold for their evaluations and time they spent talking with him.   He is hopeful Marco Arnold will send him to [facility] for further evaluation.  We discussed Hospice support.  When he reaches the point where he can no longer take HD for whatever reason, he will be instantly eligible for [facility].  I explained how nice the facilities are and the great work they do.  Marco Arnold understands and accepts the information but tells me he will do hemodialysis as long as he possibly can.    Primary Decision Maker:  Marco Arnold daughter, step son and father.    SUMMARY OF RECOMMENDATIONS    Continue full code full scope. Patient is a good candidate for Palliative support outpatient but is concerned about co-pays and expense.   Code Status/Advance Care Planning:  Full   Symptom Management:   Per primary.  Additional Recommendations (Limitations, Scope, Preferences):  Full Scope Treatment  Palliative Prophylaxis:   Frequent Pain Assessment  Psycho-social/Spiritual:   Desire for further Chaplaincy support: Christian.  Support would be welcome.  Prognosis:  Likely less than 6 months based on advanced HF, valvular disease, PFO, ESRD    Discharge Planning: Home with Home Health      Primary Diagnoses: Present on Admission: . Chest pain   I have reviewed the medical record, interviewed the patient and family, and examined the patient. The following aspects are pertinent.  Past Medical History:  Diagnosis Date  . Arthritis    spine  . Chronic kidney disease    [place] MWF  . Coronary artery disease   . Dialysis patient [facility]) 2013  .  Diverticulitis   . Hyperlipidemia   . Hypertension    dr  Tobie Lords      @  [employer]  med  . Neuromuscular disorder ([place])    nerve pain - tx w/cymbalta  . Neuropathic pain 09/20/2017   Left mid-thoracic   Social History   Socioeconomic History  . Marital status: Divorced    Spouse name: Not on file  . Number of children: 2  . Years of education: 53  . Highest education level: Not on file  Occupational History  . Occupation: disabled  Tobacco Use  . Smoking status: Light Tobacco Smoker    Packs/day: 0.25    Years: 43.00    Pack years: 10.75    Types: Cigarettes  . Smokeless tobacco: Never Used  . Tobacco comment: pt states that he is trying to quit--is trying to cut back at the current time  Vaping Use  . Vaping Use: Former  . Quit date: 08/28/2018  Substance and Sexual Activity  . Alcohol use: Yes    Comment: occasional  . Drug use: Yes    Types: Marijuana  . Sexual activity: Not on file  Other Topics Concern  . Not on file  Social History Narrative   Lives with father   Caffeine use: none   Right handed    Social Determinants of Health   Financial Resource Strain:   . Difficulty of Paying Living Expenses: Not on file  Food Insecurity:   . Worried About Charity fundraiser in the Last Year: Not on file  . Ran Out of Food in the Last Year: Not on file  Transportation Needs:   . Lack of Transportation (Medical): Not on file  . Lack of Transportation (Non-Medical): Not on file  Physical Activity:   . Days of Exercise per Week: Not on file  . Minutes of Exercise per Session: Not on file  Stress:   . Feeling of Stress : Not on file  Social Connections:   . Frequency of Communication with Friends and Family: Not on file  . Frequency of Social Gatherings with Friends and Family: Not on file  . Attends Religious Services: Not on file  . Active Member of Clubs or Organizations: Not on file  . Attends Archivist Meetings: Not on file  . Marital Status: Not on  file   Family History  Problem Relation Age of Onset  . Pancreatic cancer Mother   . Diabetes Father   . Hypertension Father   . Cancer - Lung Paternal Grandfather     Allergies  Allergen Reactions  . Lisinopril Shortness Of Breath    Unable to breath  . Heparin Other (See Comments)    11/4 HIT panel pending     Vital Signs: BP (!) 142/90 (BP Location: Right Arm)   Pulse 76   Temp 98.2 F (36.8 C) (Oral)   Resp 18   Ht 6' 3.5" (1.918 m)   Wt 99.7 kg   SpO2 98%   BMI 27.11 kg/m  Pain Scale: 0-10 POSS *See Group Information*: 1-Acceptable,Awake and alert Pain Score: 8    SpO2: SpO2: 98 % O2 Device:SpO2: 98 % O2 Flow Rate: .O2 Flow Rate (L/min): 2 L/min    Palliative Assessment/Data: 50%     Time In: 9:00 Time Out: 9:30 Time In:  2:30 Time out:  3:00 Time Total: 60 min. Visit consisted of counseling and education dealing with the complex and emotionally intense issues surrounding the need for palliative care and symptom management in the setting of serious and potentially life-threatening illness. Greater than 50%  of this time was spent  counseling and coordinating care related to the above assessment and plan.  Signed by: Florentina Jenny, PA-C Palliative Medicine  Please contact Palliative Medicine Team phone at [phone] for questions and concerns.  For individual provider: See Shea Evans

## 2020-07-03 ENCOUNTER — Telehealth: Payer: Self-pay | Admitting: Family Medicine

## 2020-07-03 ENCOUNTER — Inpatient Hospital Stay (HOSPITAL_COMMUNITY): Payer: Medicare Other

## 2020-07-03 DIAGNOSIS — I2510 Atherosclerotic heart disease of native coronary artery without angina pectoris: Secondary | ICD-10-CM | POA: Diagnosis not present

## 2020-07-03 DIAGNOSIS — R0602 Shortness of breath: Secondary | ICD-10-CM | POA: Diagnosis not present

## 2020-07-03 LAB — CBC WITH DIFFERENTIAL/PLATELET
Abs Immature Granulocytes: 0.03 10*3/uL (ref 0.00–0.07)
Basophils Absolute: 0 10*3/uL (ref 0.0–0.1)
Basophils Relative: 0 %
Eosinophils Absolute: 0.6 10*3/uL — ABNORMAL HIGH (ref 0.0–0.5)
Eosinophils Relative: 6 %
HCT: 40.4 % (ref 39.0–52.0)
Hemoglobin: 13 g/dL (ref 13.0–17.0)
Immature Granulocytes: 0 %
Lymphocytes Relative: 8 %
Lymphs Abs: 0.7 10*3/uL (ref 0.7–4.0)
MCH: 27.4 pg (ref 26.0–34.0)
MCHC: 32.2 g/dL (ref 30.0–36.0)
MCV: 85.2 fL (ref 80.0–100.0)
Monocytes Absolute: 1.5 10*3/uL — ABNORMAL HIGH (ref 0.1–1.0)
Monocytes Relative: 17 %
Neutro Abs: 6.4 10*3/uL (ref 1.7–7.7)
Neutrophils Relative %: 69 %
Platelets: 107 10*3/uL — ABNORMAL LOW (ref 150–400)
RBC: 4.74 MIL/uL (ref 4.22–5.81)
RDW: 16.5 % — ABNORMAL HIGH (ref 11.5–15.5)
WBC: 9.3 10*3/uL (ref 4.0–10.5)
nRBC: 0 % (ref 0.0–0.2)

## 2020-07-03 LAB — BASIC METABOLIC PANEL
Anion gap: 16 — ABNORMAL HIGH (ref 5–15)
BUN: 55 mg/dL — ABNORMAL HIGH (ref 6–20)
CO2: 22 mmol/L (ref 22–32)
Calcium: 9.3 mg/dL (ref 8.9–10.3)
Chloride: 91 mmol/L — ABNORMAL LOW (ref 98–111)
Creatinine, Ser: 8.11 mg/dL — ABNORMAL HIGH (ref 0.61–1.24)
GFR, Estimated: 7 mL/min — ABNORMAL LOW (ref >60)
Glucose, Bld: 121 mg/dL — ABNORMAL HIGH (ref 70–99)
Potassium: 4.1 mmol/L (ref 3.5–5.1)
Sodium: 129 mmol/L — ABNORMAL LOW (ref 135–145)

## 2020-07-03 MED ORDER — DOXYCYCLINE HYCLATE 100 MG PO TABS: 100.0000 mg | ORAL_TABLET | Freq: Two times a day (BID) | ORAL | Status: DC

## 2020-07-03 MED ORDER — HYDROCODONE-ACETAMINOPHEN 5-325 MG PO TABS
1.0000 | ORAL_TABLET | Freq: Two times a day (BID) | ORAL | 0 refills | Status: AC | PRN
Start: 2020-07-03 — End: 2020-07-08

## 2020-07-03 NOTE — Progress Notes (Signed)
   07/02/20 2035  Assess: MEWS Score  Temp 98.4 F (36.9 C)  BP 106/67  Pulse Rate 70  ECG Heart Rate 70  Resp 18  Level of Consciousness Alert  SpO2 97 %  O2 Device Room Air  Assess: MEWS Score  MEWS Temp 0  MEWS Systolic 0  MEWS Pulse 0  MEWS RR 0  MEWS LOC 0  MEWS Score 0  MEWS Score Color Green

## 2020-07-03 NOTE — Discharge Instructions (Signed)
It was a pleasure taking care of you.  You were admitted to the hospital for chest pain. When the cardiologists looked at your heart with a catheterization, they found significant blockage and some abnormalities with the valves. Our cardiothoracic surgery team felt you were too high risk to perform surgery at this time.  Please follow up with [facility], as well as your primary care doctor, your cardiologist and kidney doctor.

## 2020-07-03 NOTE — Telephone Encounter (Signed)
Called patient and informed him of MRSA growing on his skin biopsy.  Informed him that antibiotics will be sent to his pharmacy and that he will take them twice daily for 7 days.  Patient understands and will pick them up from the pharmacy.  No other questions or concerns at this time.

## 2020-07-03 NOTE — Progress Notes (Signed)
Patient reports supposed be discharged today writer beeped MD to inquire for patient.

## 2020-07-03 NOTE — Progress Notes (Signed)
Progress Note  Patient Name: Marco Arnold Date of Encounter: 07/03/2020  [facility] Cardiologist: CHF clinic  Subjective   No complaints  Inpatient Medications    Scheduled Meds: . acetaminophen  1,000 mg Oral Q8H  . aspirin  81 mg Oral Daily  . atorvastatin  80 mg Oral Daily  . carvedilol  6.25 mg Oral BID WC  . Chlorhexidine Gluconate Cloth  6 each Topical Q0600  . Chlorhexidine Gluconate Cloth  6 each Topical Q0600  . heparin injection (subcutaneous)  5,000 Units Subcutaneous Q8H  . hydrALAZINE  25 mg Oral Q8H  . isosorbide mononitrate  30 mg Oral Daily  . lanthanum  4,000 mg Oral TID WC  . lidocaine  1 patch Transdermal Q24H  . multivitamin  1 tablet Oral Q M,W,F  . mupirocin ointment  1 application Nasal BID  . pantoprazole  40 mg Oral Daily  . sodium chloride flush  3 mL Intravenous Q12H  . sodium chloride flush  3 mL Intravenous Q12H   Continuous Infusions: . sodium chloride    . sodium chloride 10 mL/hr at 07/01/20 1348   PRN Meds: sodium chloride, albuterol, cyclobenzaprine, diclofenac Sodium, HYDROcodone-acetaminophen, hydrOXYzine, lidocaine (PF), ondansetron (ZOFRAN) IV, polyethylene glycol, sodium chloride flush   Vital Signs    Vitals:   07/02/20 1225 07/02/20 2035 07/03/20 0400 07/03/20 0923  BP: 138/86 106/67 (!) 136/92 119/82  Pulse: 75 70 77 72  Resp: 20 18 18    Temp: 97.9 F (36.6 C) 98.4 F (36.9 C) 98.8 F (37.1 C)   TempSrc: Oral Oral Oral   SpO2: 100% 97% 98%   Weight:   102 kg   Height:        Intake/Output Summary (Last 24 hours) at 07/03/2020 0931 Last data filed at 07/03/2020 0600 Gross per 24 hour  Intake 610 ml  Output --  Net 610 ml   Last 3 Weights 07/03/2020 07/02/2020 07/01/2020  Weight (lbs) 224 lb 14.4 oz 219 lb 12.8 oz 216 lb 7.9 oz  Weight (kg) 102.014 kg 99.701 kg 98.2 kg      Telemetry    SR - Personally Reviewed  ECG    n/a- Personally Reviewed  Physical Exam   GEN: No acute distress.   Neck: No  JVD Cardiac: RRR, 3/6 systolic murmur rusb, no jvd Respiratory: Clear to auscultation bilaterally. GI: Soft, nontender, non-distended  MS: trace bilaterla edema; No deformity. Neuro:  Nonfocal  Psych: Normal affect   Labs    High Sensitivity Troponin:   Recent Labs  Lab 06/27/20 1234 06/27/20 1424 06/27/20 1937 06/27/20 2123  TROPONINIHS 257* 279* 263* 302*      Chemistry Recent Labs  Lab 06/28/20 0437 06/28/20 1543 06/29/20 0653 06/30/20 0226 07/01/20 0308 07/02/20 0156 07/03/20 0348  NA 129*   < > 133*   < > 132* 130* 129*  K 5.9*   < > 4.3   < > 4.2 3.8 4.1  CL 86*  --  90*   < > 93* 90* 91*  CO2 16*  --  21*   < > 24 23 22   GLUCOSE 104*  --  139*   < > 112* 117* 121*  BUN 94*  --  63*   < > 47* 34* 55*  CREATININE 10.03*  --  7.49*   < > 7.06* 5.87* 8.11*  CALCIUM 9.2  --  8.8*   < > 8.8* 9.2 9.3  ALBUMIN 3.3*  --  3.2*  --   --   --   --  GFRNONAA 6*  --  8*   < > 9* 11* 7*  ANIONGAP 27*  --  22*   < > 15 17* 16*   < > = values in this interval not displayed.     Hematology Recent Labs  Lab 07/01/20 0308 07/02/20 0156 07/03/20 0348  WBC 9.4 8.9 9.3  RBC 5.05 4.73 4.74  HGB 14.2 13.1 13.0  HCT 44.0 41.2 40.4  MCV 87.1 87.1 85.2  MCH 28.1 27.7 27.4  MCHC 32.3 31.8 32.2  RDW 16.9* 16.8* 16.5*  PLT 123* 111* 107*    BNPNo results for input(s): BNP, PROBNP in the last 168 hours.   DDimer No results for input(s): DDIMER in the last 168 hours.   Radiology    ECHO TEE  Result Date: 07/01/2020    TRANSESOPHOGEAL ECHO REPORT   Patient Name:   [name] Date of Exam: 07/01/2020 Medical Rec #:  [id number]     Height:       75.5 in Accession #:    [id number]    Weight:       216.5 lb Date of Birth:  1966-02-14     BSA:          2.281 m Patient Age:    71 years      BP:           106/88 mmHg Patient Gender: M             HR:           68 bpm. Exam Location:  Inpatient Procedure: Transesophageal Echo, Cardiac Doppler and Color Doppler Indications:      I35.0 Nonrheumatic aortic (valve) stenosis  History:         Patient has prior history of Echocardiogram examinations, most                  recent 06/27/2020. CAD, Mitral Valve Disease and Aortic Valve                  Disease; Risk Factors:Hypertension and Dyslipidemia. CKD.  Sonographer:     Jonelle Sidle Dance Referring Phys:  [place] Diagnosing Phys: Loralie Champagne MD PROCEDURE: The transesophogeal probe was passed without difficulty through the esophogus of the patient. Local oropharyngeal anesthetic was provided with Cetacaine. Sedation performed by different physician. The patient was monitored while under deep sedation. Anesthestetic sedation was provided intravenously by Anesthesiology: 177.48mg  of Propofol, 100mg  of Lidocaine. The patient developed no complications during the procedure. IMPRESSIONS  1. Left ventricular ejection fraction, by estimation, is 20 to 25%. The left ventricle has severely decreased function. The left ventricle demonstrates global hypokinesis. The left ventricular internal cavity size was mildly dilated. There is mild left ventricular hypertrophy.  2. Right ventricular systolic function is moderately reduced. The right ventricular size is mildly enlarged.  3. Left atrial size was moderately dilated. No left atrial/left atrial appendage thrombus was detected.  4. Right atrial size was moderately dilated.  5. The mitral valve is normal in structure. Moderate mitral valve regurgitation. No evidence of mitral stenosis.  6. Peak RV-RA gradient 29 mmHg. Tricuspid valve regurgitation is moderate.  7. There was moderate to severe aortic stenosis with mean gradient 24 mmHg, AVA 0.84 cm^2 by continuity equation and 1.2 cm^2 by planimetry. The aortic valve is tricuspid. Aortic valve regurgitation is not visualized.  8. Small PFO noted by color doppler.  9. Normal caliber thoracic aorta with mild plaque. FINDINGS  Left Ventricle: Left  ventricular ejection fraction, by estimation, is 20  to 25%. The left ventricle has severely decreased function. The left ventricle demonstrates global hypokinesis. The left ventricular internal cavity size was mildly dilated. There is mild left ventricular hypertrophy. Right Ventricle: The right ventricular size is mildly enlarged. No increase in right ventricular wall thickness. Right ventricular systolic function is moderately reduced. Left Atrium: Left atrial size was moderately dilated. No left atrial/left atrial appendage thrombus was detected. Right Atrium: Right atrial size was moderately dilated. Pericardium: Trivial pericardial effusion is present. Mitral Valve: The mitral valve is normal in structure. Moderate mitral valve regurgitation. No evidence of mitral valve stenosis. Tricuspid Valve: Peak RV-RA gradient 29 mmHg. The tricuspid valve is normal in structure. Tricuspid valve regurgitation is moderate. Aortic Valve: There was moderate to severe aortic stenosis with mean gradient 24 mmHg, AVA 0.84 cm^2 by continuity equation and 1.2 cm^2 by planimetry. The aortic valve is tricuspid. Aortic valve regurgitation is not visualized. Aortic valve mean gradient measures 22.0 mmHg. Aortic valve peak gradient measures 35.5 mmHg. Aortic valve area, by VTI measures 0.87 cm. Pulmonic Valve: The pulmonic valve was normal in structure. Pulmonic valve regurgitation is not visualized. Aorta: Normal caliber thoracic aorta with mild plaque. and the aortic arch was not well visualized. IAS/Shunts: Small PFO noted by color doppler.  LEFT VENTRICLE PLAX 2D LVOT diam:     2.10 cm LV SV:         49 LV SV Index:   21 LVOT Area:     3.46 cm  AORTIC VALVE AV Area (Vmax):    0.95 cm AV Area (Vmean):   0.80 cm AV Area (VTI):     0.87 cm AV Vmax:           298.00 cm/s AV Vmean:          219.000 cm/s AV VTI:            0.564 m AV Peak Grad:      35.5 mmHg AV Mean Grad:      22.0 mmHg LVOT Vmax:         81.50 cm/s LVOT Vmean:        50.400 cm/s LVOT VTI:          0.141 m LVOT/AV  VTI ratio: 0.25 TRICUSPID VALVE TR Peak grad:   27.5 mmHg TR Vmax:        262.00 cm/s  SHUNTS Systemic VTI:  0.14 m Systemic Diam: 2.10 cm Loralie Champagne MD Electronically signed by Loralie Champagne MD Signature Date/Time: 07/01/2020/8:56:00 PM    Final    VAS US DOPPLER PRE CABG  Result Date: 07/01/2020 PREOPERATIVE VASCULAR EVALUATION  Risk Factors:     Hypertension, current smoker, coronary artery disease. Other Factors:    ESRD, on dialysis, aortic and mitral valve disease. Comparison Study: No prior study Performing Technologist: Sharion Dove RVS Supporting Technologist: Abram Sander RVS, Darlin Coco RDMS  Examination Guidelines: A complete evaluation includes B-mode imaging, spectral Doppler, color Doppler, and power Doppler as needed of all accessible portions of each vessel. Bilateral testing is considered an integral part of a complete examination. Limited examinations for reoccurring indications may be performed as noted.  Right Carotid Findings: +----------+--------+--------+--------+--------+------------------+           PSV cm/sEDV cm/sStenosisDescribeComments           +----------+--------+--------+--------+--------+------------------+ CCA Prox  44      7  intimal thickening +----------+--------+--------+--------+--------+------------------+ CCA Distal30      7                       intimal thickening +----------+--------+--------+--------+--------+------------------+ ICA Prox  34      13      1-39%   calcific                   +----------+--------+--------+--------+--------+------------------+ ICA Distal44      16                                         +----------+--------+--------+--------+--------+------------------+ ECA       64      6                                          +----------+--------+--------+--------+--------+------------------+ Portions of this table do not appear on this page.  +----------+--------+-------+----------------+------------+           PSV cm/sEDV cmsDescribe        Arm Pressure +----------+--------+-------+----------------+------------+ [id number]             Multiphasic, WNL             +----------+--------+-------+----------------+------------+ +---------+--------+--+--------+--+---------+ VertebralPSV cm/s38EDV cm/s12Antegrade +---------+--------+--+--------+--+---------+ Left Carotid Findings: +----------+--------+--------+--------+--------+------------------+           PSV cm/sEDV cm/sStenosisDescribeComments           +----------+--------+--------+--------+--------+------------------+ CCA Prox  35      13                      intimal thickening +----------+--------+--------+--------+--------+------------------+ CCA Distal47      12                      intimal thickening +----------+--------+--------+--------+--------+------------------+ ICA Prox  35      13      1-39%   calcific                   +----------+--------+--------+--------+--------+------------------+ ICA Distal43      14                                         +----------+--------+--------+--------+--------+------------------+ ECA       37      1                                          +----------+--------+--------+--------+--------+------------------+ +----------+--------+--------+----------------+------------+ SubclavianPSV cm/sEDV cm/sDescribe        Arm Pressure +----------+--------+--------+----------------+------------+           26              Multiphasic, WNL             +----------+--------+--------+----------------+------------+ +---------+--------+--+--------+-+---------+ VertebralPSV cm/s26EDV cm/s8Antegrade +---------+--------+--+--------+-+---------+  ABI Findings: +--------+------------------+-----+----------+--------+ Right   Rt Pressure (mmHg)IndexWaveform  Comment   +--------+------------------+-----+----------+--------+ [id number]                    triphasic          +--------+------------------+-----+----------+--------+ PTA     255  1.72 monophasic         +--------+------------------+-----+----------+--------+ DP      255               1.72 monophasic         +--------+------------------+-----+----------+--------+ +--------+------------------+-----+----------+-----------+ Left    Lt Pressure (mmHg)IndexWaveform  Comment     +--------+------------------+-----+----------+-----------+ Brachial                                 A-V fistula +--------+------------------+-----+----------+-----------+ PTA     255               1.72 monophasic            +--------+------------------+-----+----------+-----------+ DP      148               1.00 monophasic            +--------+------------------+-----+----------+-----------+ +-------+---------------+----------------+ ABI/TBIToday's ABI/TBIPrevious ABI/TBI +-------+---------------+----------------+ Right  [place]                              +-------+---------------+----------------+ Left   1.0                             +-------+---------------+----------------+  Right Doppler Findings: +--------+--------+-----+---------+--------+ Site    PressureIndexDoppler  Comments +--------+--------+-----+---------+--------+ [id number]          triphasic         +--------+--------+-----+---------+--------+ Radial               triphasic         +--------+--------+-----+---------+--------+ Ulnar                triphasic         +--------+--------+-----+---------+--------+  Left Doppler Findings: +--------+--------+-----+--------+-----------+ Site    PressureIndexDoppler Comments    +--------+--------+-----+--------+-----------+ Brachial                     A-V fistula +--------+--------+-----+--------+-----------+ Radial               biphasic             +--------+--------+-----+--------+-----------+  Summary: Right Carotid: Velocities in the right ICA are consistent with a 1-39% stenosis. Left Carotid: Velocities in the left ICA are consistent with a 1-39% stenosis. Vertebrals:  Bilateral vertebral arteries demonstrate antegrade flow. Subclavians: Normal flow hemodynamics were seen in bilateral subclavian              arteries. Right ABI: Resting right ankle-brachial index indicates noncompressible right lower extremity arteries. ABIs are unreliable. Left ABI: Resting left ankle-brachial index is within normal range. No evidence of significant left lower extremity arterial disease. ABIs are unreliable. Right Upper Extremity: Doppler waveform obliterate with right radial compression. Doppler waveforms remain within normal limits with right ulnar compression.  Electronically signed by Jamelle Haring on 07/01/2020 at 7:08:34 PM.    Final     Cardiac Studies     Patient Profile      Assessment & Plan    1. CAD/ICM/Chronic systolic HF - history of PCI to LAD 11/20 - mild trop this admission in setting of CHF, not overally consistent with ACS - cath this admit showed ostial LM 80%. CI 2.29, mean PA 37, PCWP 13, LVEDP 23. AV mean gradient 8 mmHg.  - echo LVEF 25-30%, mod RV dysfunction. Mod MR. Low flow low  gradient AS AVA VTI 0.73, mean grad 15, DI 0.2 - 05/2019 [facility] echo LVEF 35-40%  - too high risk for CABG/SAVR per CT surgery. From notes high risk poor PCI target.,  - medical therapy with ASA 81, atorva 80, coreg 6.25mg  bid, hydral 25/mg tid, imdur 30 mg. From notes have avoided ACE/ARB/ARNI/aldactone due to prior issues with hyperkalemia even despite HD - volume is controlled by HD  2. Aortic stenosis -  Low flow low gradient AS AVA VTI 0.73, mean grad 15, DI 0.2 - No significant pullback gradient across the valve on cath - TEE mod to severe AS mean grad 24, AVA VTI 0.84  - not a candidate for surgical AVR - seen by structural  heart team to consider TAVR. From Dr Antionette Char note prohibitive risk for TAVR consideration. To consider second opinion as outpatient at tertiary care center.   3. ESRD - per nephrology  4. Thomrbocytopenia - had been on heparin, now off - hep induced plt ab levels not elevated  5. Skin lesions: He says that these small ulcerations have been present for months.  He has had antibiotic courses through ID in [facility] it sounds like.  Last blood cultures from his HD clinic were negative per patient.  Etiology unclear. 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   - ID following - punch biopsy performed by general surgery 11/4. Pathology pending.    Nothing to add today from cardiac standpoint, we will arrange outpatient f/u in CHF clinic. [place] for discharge from cardiac standpoint. Can discus possible referal to academic center at follow up.    For questions or updates, please contact [place] Please consult [email or web address] for contact info under        Signed, Carlyle Dolly, MD  07/03/2020, 9:31 AM

## 2020-07-03 NOTE — Progress Notes (Signed)
[facility] Progress Note  Subjective: seen in room, no new c/o  Vitals:   07/02/20 2035 07/03/20 0400 07/03/20 0923 07/03/20 1220  BP: 106/67 (!) 136/92 119/82 (!) 126/91  Pulse: 70 77 72 70  Resp: 18 18  20   Temp: 98.4 F (36.9 C) 98.8 F (37.1 C)  98.6 F (37 C)  TempSrc: Oral Oral  Oral  SpO2: 97% 98%  100%  Weight:  102 kg    Height:        Exam:   alert, nad   no jvd  Chest cta bilat  Cor reg no RG  Abd soft ntnd no ascites obese   Ext mild pitting bilat ankle edema   Alert, NF, ox3   LUE AVF+bruit     OP HD: [place]  MWF   4.5h  450/ 2.0  99.5kg  2/2 bath  P2  15g  Hep 3000  LUE AVF   - max UF is 7kg during week and 9kg on mondays   Hectorol 7 mcg IVP q HD   Sensipar 60mg  PO q HD   Lanthanum 4 tabs PO q meal  Assessment/ Plan: 1. NSTEMI: Troponin elevated but flat. Echo showed worsening CHF with EF 25-30%. LHC showed severe L main lesion, patent old LAD stent.  Per TCTS is too high risk for CABG. Not TAVR candidate either. Pt planning for 2nd opinion at [facility].  2.  ESRD:  On MWF schedule. Short HD Sat today, 3h, 3L UF 3.  Hypertension/volume: BP meds per cards. LVEDP was good here at 37mm Hg. Up 3kg today.   4.  Anemia: Hgb >12, no ESA indicated.  5.  Metabolic bone disease: Outpatient phos usually around 13. Phos here was 10. Pt is not compliant with renal diet. Continue lanthanum.  6.  Nutrition:  Alb 3.3. Currently NPO  7. Dispo - possible dc today after HD. Ok from renal standpoint.      Rob Ponce Skillman 07/03/2020, 1:04 PM   Recent Labs  Lab 06/28/20 0437 06/28/20 1543 06/29/20 0653 06/30/20 0226 07/02/20 0156 07/03/20 0348  K 5.9*   < > 4.3   < > 3.8 4.1  BUN 94*  --  63*   < > 34* 55*  CREATININE 10.03*  --  7.49*   < > 5.87* 8.11*  CALCIUM 9.2  --  8.8*   < > 9.2 9.3  PHOS >30.0*  --  10.9*  --   --   --   HGB 14.7   < > 14.9   < > 13.1 13.0   < > = values in this interval not displayed.   Inpatient medications: .  acetaminophen  1,000 mg Oral Q8H  . aspirin  81 mg Oral Daily  . atorvastatin  80 mg Oral Daily  . carvedilol  6.25 mg Oral BID WC  . Chlorhexidine Gluconate Cloth  6 each Topical Q0600  . Chlorhexidine Gluconate Cloth  6 each Topical Q0600  . heparin injection (subcutaneous)  5,000 Units Subcutaneous Q8H  . hydrALAZINE  25 mg Oral Q8H  . isosorbide mononitrate  30 mg Oral Daily  . lanthanum  4,000 mg Oral TID WC  . lidocaine  1 patch Transdermal Q24H  . multivitamin  1 tablet Oral Q M,W,F  . mupirocin ointment  1 application Nasal BID  . pantoprazole  40 mg Oral Daily  . sodium chloride flush  3 mL Intravenous Q12H  . sodium chloride flush  3 mL Intravenous Q12H   .  sodium chloride    . sodium chloride 10 mL/hr at 07/01/20 1348   sodium chloride, albuterol, cyclobenzaprine, diclofenac Sodium, HYDROcodone-acetaminophen, hydrOXYzine, lidocaine (PF), ondansetron (ZOFRAN) IV, polyethylene glycol, sodium chloride flush

## 2020-07-03 NOTE — Discharge Summary (Addendum)
[facility] Discharge Summary  Patient name: Marco Arnold Medical record number: [id number] Date of birth: 03/04/1966 Age: 54 y.o. Gender: male Date of Admission: 06/27/2020  Date of Discharge: 07/03/2020 Admitting Physician: Lenoria Chime, MD  Primary Care Provider: Cyndi Bender, PA-C Consultants: Cardiology, nephrology, palliative care, orthopedics, ID  Indication for Hospitalization: chest pain   Discharge Diagnoses/Problem List:  ACS Acute on chronic combined systolic and diastolic HF ESRD Thrombocytopenia Chronic low back pain GERD Tobacco use disorder  Prediabetes  Diffuse skin lesions Bilateral foot pain   Disposition: home  Discharge Condition: medically stable   Discharge Exam:   Temp:  [97.8 F (36.6 C)-98.8 F (37.1 C)] 98.8 F (37.1 C) (11/06 0400) Pulse Rate:  [70-78] 77 (11/06 0400) Resp:  [18-20] 18 (11/06 0400) BP: (106-138)/(67-92) 136/92 (11/06 0400) SpO2:  [97 %-100 %] 98 % (11/06 0400) Weight:  [102 kg] 102 kg (11/06 0400) Physical Exam: General: alert, NAD Cardiovascular: RRR,  III/VI systolic murmur at RUSB Respiratory: normal WOB, lungs CTAB Abdomen: soft, nontender Extremities: 2+ pitting edema of bilateral lower extremities up to mid calf Skin: multiple skin lesions diffusely on extremities and face.   Physical exam performed by Dr. Rock Nephew, [place] Hospital Course:  Marco Arnold is a 54 y.o. male smoker presenting with chest tightness and dyspnea likely due to CHF vs ACS. PMH is significant for ESRD, CAD s/p PCI of LAD (06/2019), HTN, AS, chronic low back pain.  ACS  Acute on chronic combined systolic and diastolic HF Presented with chest pain and worsening dyspnea. Initial EKG showed no ST elevations. CXR demonstrated enlarged cardiac silhouette and calcific atherosclerosis disease of the aorta, no focal airspace consolidation. Troponins trended 279>263>302. Echo demonstrated worsening EF 25-30% with  severely decreased function of the LV and LV global hyperkinesis compared to echo a year ago with EF 35-40% with worsening cardiomyopathy. Cardiology consulted and followed patient throughout his hospitalization. Cath performed notable for 80% distal left main stenosis with findings consistent with aortic valve stenosis. Recommended CABG with possible aortic valve replacement. Placed on atorvastatin 80 mg, aspirin 81 mg and carvedilol 3.125 mg bid. HF team and cardiothoracic surgery began following patient. HF team also recommended hydralazine 25 tid and imdur 30 mg daily. TEE findings consistent with echo, also notable for moderate mitral and tricuspid regurgitation. Decision was later made by cardiothoracic surgery to not perform CABG due to high risk situation given patient's renal failure. Cardiothoracic surgery consulted palliative care given patient's multiple comorbidities. Patient requesting to seek care at [facility] to get second opinion.   ESRD Patient receiving outpatient dialysis, compliant on MWF schedule. Nephrology consulted and patient had multiple sessions of HD during hospital stay. Not recommended to use morphine for pain control due to ESRD status. At discharge, Cr 8.11 and GFR 7.   Thrombocytopenia Platelet count wnl on admission. Began to gradually downtrend. On day of discharge was 107.  Patient initially started on heparin for tentative CABG, heparin discontinued and HIT assay negative.   Prediabetes A1c 6.1. Patient had hyperglycemic episode during hospitalization and required 10 units of insulin once. CBGs monitored and improved prior to discharge.   Diffuse skin lesions  Bilateral foot pain Patient presented on admission with systemic, healing skin lesions. General surgery performed punch biopsy which was notable for MRSA. Placed on antibiotics, instructed to take doxycycline bid for total 7 day course, patient discharged with this medication. Orthopedics consulted due to bilateral  foot pain, thought to be  at least neuropathic in origin. Both right and left x-rays demonstrated diffuse soft tissue swelling without acute osseous abnormality, degenerative changes in the dorsal tarsal joints and small achilles and plantar calcaneal spurs bilaterally. 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 Findings not consistent with fracture, gout, osteomyelitis and septic arthritis.    All other issues stable and chronic.    Issues for Follow Up:  1. Follow up with PCP for prediabetes, repeat BMP and  A1c in 3 months.  2. Repeat CBC w/ diff to monitor thrombocytopenia. 3. Encourage tobacco cessation 4. Follow up cardiology outpatient, see below.   Significant Procedures:  Cath performed on 11/1 TEE performed on 11/4 Skin punch biopsy on 11/4  Significant Labs and Imaging:  Recent Labs  Lab 07/01/20 0308 07/02/20 0156 07/03/20 0348  WBC 9.4 8.9 9.3  HGB 14.2 13.1 13.0  HCT 44.0 41.2 40.4  PLT 123* 111* 107*   Recent Labs  Lab 06/28/20 0437 06/28/20 1543 06/29/20 0653 06/29/20 0653 06/30/20 0226 06/30/20 0659 07/01/20 0308 07/01/20 0308 07/02/20 0156 07/03/20 0348  NA 129*   < > 133*  --  130*  --  132*  --  130* 129*  K 5.9*   < > 4.3   < > 6.6*   < > 4.2   < > 3.8 4.1  CL 86*  --  90*  --  92*  --  93*  --  90* 91*  CO2 16*  --  21*  --  18*  --  24  --  23 22  GLUCOSE 104*  --  139*  --  122*  --  112*  --  117* 121*  BUN 94*  --  63*  --  80*  --  47*  --  34* 55*  CREATININE 10.03*  --  7.49*  --  8.78*  --  7.06*  --  5.87* 8.11*  CALCIUM 9.2  --  8.8*  --  8.4*  --  8.8*  --  9.2 9.3  PHOS >30.0*  --  10.9*  --   --   --   --   --   --   --   ALBUMIN 3.3*  --  3.2*  --   --   --   --   --   --   --    < > = values in this interval not displayed.      Results/Tests Pending at Time of Discharge: No results found.  Discharge Medications:  Allergies as of 07/03/2020       Reactions   Lisinopril Shortness Of Breath   Unable to breath      Medication List    STOP taking these medications   naproxen sodium 220 MG tablet Commonly known as: ALEVE     TAKE these medications   acetaminophen 650 MG CR tablet Commonly known as: Tylenol 8 Hour Take 1 tablet (650 mg total) by mouth every 8 (eight) hours as needed for pain.   amLODipine 10 MG tablet Commonly known as: NORVASC Take 10 mg by mouth at bedtime.   aspirin EC 81 MG tablet Take 81 mg by mouth daily.   atorvastatin 80 MG tablet Commonly known as: LIPITOR Take 80 mg by mouth daily.   carvedilol 3.125 MG tablet Commonly known as: COREG Take 3.125 mg by mouth 2 (two) times daily with a meal.   cloNIDine 0.1 MG  tablet Commonly known as: CATAPRES Take 0.2 mg by mouth daily.   cyclobenzaprine 10 MG tablet Commonly known as: FLEXERIL Take 20 mg by mouth 2 (two) times daily as needed for muscle spasms.   DULoxetine 60 MG capsule Commonly known as: CYMBALTA Take 60 mg by mouth every morning.   Fosrenol 1000 MG chewable tablet Generic drug: lanthanum Chew 6,000 mg by mouth 3 (three) times daily with meals.   gabapentin 100 MG capsule Commonly known as: NEURONTIN Take 3 capsules (300 mg total) by mouth at bedtime.   HYDROcodone-acetaminophen 5-325 MG tablet Commonly known as: NORCO/VICODIN Take 1-2 tablets by mouth every 12 (twelve) hours as needed for up to 5 days for moderate pain. What changed:   how much to take  when to take this   hydrOXYzine 25 MG tablet Commonly known as: ATARAX/VISTARIL Take 25 mg by mouth 2 (two) times daily as needed for anxiety or itching (Sleep).   lidocaine 5 % Commonly known as: Lidoderm Place 1 patch onto the skin daily as needed. Apply patch to area most significant pain once per day.  Remove and discard patch within 12 hours of application. What changed:   reasons to take this  additional instructions   MISC NATURAL PRODUCTS EX Apply  topically. Patient applies horse liniments as needed for chronic low back pain (contains menthol)   multivitamin Tabs tablet Take 1 tablet by mouth See admin instructions. Every Monday, Wednesday and Friday   nortriptyline 25 MG capsule Commonly known as: PAMELOR Take 25 mg by mouth every morning.   pantoprazole 40 MG tablet Commonly known as: PROTONIX Take 40 mg by mouth daily.   Ventolin HFA 108 (90 Base) MCG/ACT inhaler Generic drug: albuterol Inhale 2 puffs into the lungs every 6 (six) hours as needed for wheezing or shortness of breath.       Discharge Instructions: Please refer to Patient Instructions section of EMR for full details.  Patient was counseled important signs and symptoms that should prompt return to medical care, changes in medications, dietary instructions, activity restrictions, and follow up appointments.   Follow-Up Appointments:  Follow-up Information    Larey Dresser, MD Follow up on 07/15/2020.   Specialty: Cardiology Why: 10:30 AM  at the [place] [phone] Contact information: [address] [place] [address] [phone]               Donney Dice, DO 07/04/2020, 6:53 PM PGY-1, [place]

## 2020-07-03 NOTE — Progress Notes (Signed)
Family Medicine Teaching Service Daily Progress Note Intern Pager: [phone]  Patient name: Marco Arnold Medical record number: [id number] Date of birth: 10/08/1965 Age: 54 y.o. Gender: male  Primary Care Provider: Cyndi Bender, PA-C Consultants: Cardiology, Nephrology, Palliative, ID, Ortho Code Status: FULL  Pt Overview and Major Events to Date:  10/31: admitted 11/1: L heart cath performed 11/4: TEE performed 11/5: deemed too high risk for CABG, palliative consulted  Assessment and Plan:  Marco A Reeseis a 54 y.o.malesmokerpresenting withchest tightness and dyspnea. PMH is significant forESRD, CAD s/p PCI of LAD (06/2019), HTN, ASandchronic low back pain.  Coronary Artery Disease Stable, denies chest pain. Patient deemed too high risk for CABG/SAVR. He would like to seek second opinion at [facility]. -HF team and CVTS following, appreciate recommendations -Palliative following, appreciate assistance -Continue ASA 81mg  daily, Atorvastatin 80mg  daily, Carvedilol 3.125mg  BID -Regular diet  Bilateral Foot Pain Ortho consulted- thought to be neuropathic component or related to his edema. Not consistent with gout, septic arthritis, or osteomyelitis. -Bilateral foot x-rays -Continue pain management with scheduled tylenol 1g q8h and Norco q12prn  Skin Lesions of Unknown Etiology Patient with diffuse skin lesions on extremities and face. See media tab for images. Punch biopsy performed by gen surgery 11/4.  -f/u biopsy results  Acute on chronic combined systolic and diastolic HF TEE on 61/6 showed EF 20-25% with severely decreased LV function, moderate mitral and tricuspid regurg, and moderate to severe aortic stenosis. -Continue HD for volume overload management -Continue Carvedilol 3.125mg  BID -Hydralazine 25mg  TID and Imdur 30mg  daily per HF team  Hyponatremia Na 129 this morning. Likely hypervolemic hyponatremia. -Continue management of volume status per  above -Cardiology and nephro following  ESRD on HD M/W/F Chronic. -Nephrology following, appreciate recommendations -Continue Rena-Vit, lanthanum -HD today  Thrombocytopenia Platelets 107 this morning (from 111 yesterday). -No longer on heparin gtt -HIT assay negative -Daily CBC  HTN Stable. BP has been wnl over past 24h. One slightly elevated reading at 136/92 this morning. -Continuecarvedilol 3.125mg  BID -Continue to monitorBP  Chronic Low Back Pain Chronic, stable. -Continue cyclobenzaprine -Scheduled tylenol 1g q8h  -Norco q12prn -Avoid morphine given ESRD status  GERD Chronic, stable. On pantoprazole 40 mg daily at home. - continueprotonix  Tobacco use disorder Smokes about 1ppd. Started smoking when he was 54 yo. -Continue toencourage tobacco cessation -Nicotine patch  Prediabetes  Stable. A1c 6.1 -diet and exercise counseling, lifestyle modifications - f/u with PCP  FEN/GI: Regular PPx: Heparin   Status is: Inpatient Remains inpatient appropriate because:Ongoing diagnostic testing needed not appropriate for outpatient work up   Dispo: The patient is from: Home              Anticipated d/c is to: Home              Anticipated d/c date is: 1 day              Patient currently is medically stable to d/c.   Subjective:  Patient eager to receive dialysis and be discharged home this afternoon. He has no physical complaints. Expresses frustration with some of his care here and would like to seek second opinion at [facility] for possible CABG.  Objective: Temp:  [97.8 F (36.6 C)-98.8 F (37.1 C)] 98.8 F (37.1 C) (11/06 0400) Pulse Rate:  [70-78] 77 (11/06 0400) Resp:  [18-20] 18 (11/06 0400) BP: (106-138)/(67-92) 136/92 (11/06 0400) SpO2:  [97 %-100 %] 98 % (11/06 0400) Weight:  [102 kg] 102 kg (11/06 0400) Physical  Exam: General: alert, NAD Cardiovascular: RRR,  III/VI systolic murmur at RUSB Respiratory: normal WOB, lungs CTAB Abdomen:  soft, nontender Extremities: 2+ pitting edema of bilateral lower extremities up to mid calf Skin: multiple skin lesions diffusely on extremities and face. See media tab for images.  Laboratory: Recent Labs  Lab 07/01/20 0308 07/02/20 0156 07/03/20 0348  WBC 9.4 8.9 9.3  HGB 14.2 13.1 13.0  HCT 44.0 41.2 40.4  PLT 123* 111* 107*   Recent Labs  Lab 07/01/20 0308 07/02/20 0156 07/03/20 0348  NA 132* 130* 129*  K 4.2 3.8 4.1  CL 93* 90* 91*  CO2 24 23 22   BUN 47* 34* 55*  CREATININE 7.06* 5.87* 8.11*  CALCIUM 8.8* 9.2 9.3  GLUCOSE 112* 117* 121*    Imaging/Diagnostic Tests: No new imaging/diagnostic tests in past 24h.   Alcus Dad, MD 07/03/2020, 7:00 AM PGY-1, [place] Intern pager: [phone], text pages welcome

## 2020-07-04 ENCOUNTER — Telehealth: Payer: Self-pay | Admitting: Nurse Practitioner

## 2020-07-04 NOTE — Telephone Encounter (Signed)
Transition of care contact from inpatient facility  Date of discharge: 07/03/2020 Date of contact: 07/04/2020 Method: Phone Spoke to: Patient  Patient contacted to discuss transition of care from recent inpatient hospitalization. Patient was admitted to [facility] from 10/31-11/01/2020 with discharge diagnosis of ACS, 80% occlusion of L Main turned down for CABG D/T being poor candidate for surgery. Seeking 2nd opinion.   Medication changes were reviewed.  Patient will follow up with his/her outpatient HD unit on: 07/04/2020

## 2020-07-05 ENCOUNTER — Encounter (HOSPITAL_COMMUNITY): Payer: Self-pay | Admitting: Cardiology

## 2020-07-05 DIAGNOSIS — Z992 Dependence on renal dialysis: Secondary | ICD-10-CM | POA: Diagnosis not present

## 2020-07-05 DIAGNOSIS — N186 End stage renal disease: Secondary | ICD-10-CM | POA: Diagnosis not present

## 2020-07-05 DIAGNOSIS — N2581 Secondary hyperparathyroidism of renal origin: Secondary | ICD-10-CM | POA: Diagnosis not present

## 2020-07-05 DIAGNOSIS — E8779 Other fluid overload: Secondary | ICD-10-CM | POA: Diagnosis not present

## 2020-07-06 DIAGNOSIS — I132 Hypertensive heart and chronic kidney disease with heart failure and with stage 5 chronic kidney disease, or end stage renal disease: Secondary | ICD-10-CM | POA: Diagnosis not present

## 2020-07-06 DIAGNOSIS — I440 Atrioventricular block, first degree: Secondary | ICD-10-CM | POA: Diagnosis not present

## 2020-07-06 DIAGNOSIS — L989 Disorder of the skin and subcutaneous tissue, unspecified: Secondary | ICD-10-CM | POA: Diagnosis not present

## 2020-07-06 DIAGNOSIS — R079 Chest pain, unspecified: Secondary | ICD-10-CM | POA: Diagnosis not present

## 2020-07-06 DIAGNOSIS — I5020 Unspecified systolic (congestive) heart failure: Secondary | ICD-10-CM | POA: Diagnosis not present

## 2020-07-06 DIAGNOSIS — I2510 Atherosclerotic heart disease of native coronary artery without angina pectoris: Secondary | ICD-10-CM | POA: Diagnosis not present

## 2020-07-06 DIAGNOSIS — R9431 Abnormal electrocardiogram [ECG] [EKG]: Secondary | ICD-10-CM | POA: Diagnosis not present

## 2020-07-06 DIAGNOSIS — I350 Nonrheumatic aortic (valve) stenosis: Secondary | ICD-10-CM | POA: Diagnosis not present

## 2020-07-06 DIAGNOSIS — Z6827 Body mass index (BMI) 27.0-27.9, adult: Secondary | ICD-10-CM | POA: Diagnosis not present

## 2020-07-06 DIAGNOSIS — N186 End stage renal disease: Secondary | ICD-10-CM | POA: Diagnosis not present

## 2020-07-06 DIAGNOSIS — I252 Old myocardial infarction: Secondary | ICD-10-CM | POA: Diagnosis not present

## 2020-07-06 DIAGNOSIS — I4510 Unspecified right bundle-branch block: Secondary | ICD-10-CM | POA: Diagnosis not present

## 2020-07-06 DIAGNOSIS — M79671 Pain in right foot: Secondary | ICD-10-CM | POA: Diagnosis not present

## 2020-07-06 DIAGNOSIS — I10 Essential (primary) hypertension: Secondary | ICD-10-CM | POA: Diagnosis not present

## 2020-07-06 DIAGNOSIS — I517 Cardiomegaly: Secondary | ICD-10-CM | POA: Diagnosis not present

## 2020-07-06 DIAGNOSIS — E785 Hyperlipidemia, unspecified: Secondary | ICD-10-CM | POA: Diagnosis not present

## 2020-07-06 DIAGNOSIS — M79672 Pain in left foot: Secondary | ICD-10-CM | POA: Diagnosis not present

## 2020-07-06 DIAGNOSIS — I509 Heart failure, unspecified: Secondary | ICD-10-CM | POA: Diagnosis not present

## 2020-07-06 DIAGNOSIS — R7401 Elevation of levels of liver transaminase levels: Secondary | ICD-10-CM | POA: Diagnosis not present

## 2020-07-06 DIAGNOSIS — I255 Ischemic cardiomyopathy: Secondary | ICD-10-CM | POA: Diagnosis not present

## 2020-07-06 LAB — AEROBIC/ANAEROBIC CULTURE W GRAM STAIN (SURGICAL/DEEP WOUND)

## 2020-07-06 LAB — SURGICAL PATHOLOGY

## 2020-07-07 DIAGNOSIS — L989 Disorder of the skin and subcutaneous tissue, unspecified: Secondary | ICD-10-CM | POA: Diagnosis not present

## 2020-07-07 DIAGNOSIS — M79604 Pain in right leg: Secondary | ICD-10-CM | POA: Diagnosis not present

## 2020-07-07 DIAGNOSIS — N186 End stage renal disease: Secondary | ICD-10-CM | POA: Diagnosis not present

## 2020-07-07 DIAGNOSIS — R079 Chest pain, unspecified: Secondary | ICD-10-CM | POA: Diagnosis not present

## 2020-07-07 DIAGNOSIS — I132 Hypertensive heart and chronic kidney disease with heart failure and with stage 5 chronic kidney disease, or end stage renal disease: Secondary | ICD-10-CM | POA: Diagnosis not present

## 2020-07-07 DIAGNOSIS — R7401 Elevation of levels of liver transaminase levels: Secondary | ICD-10-CM | POA: Diagnosis not present

## 2020-07-07 DIAGNOSIS — I517 Cardiomegaly: Secondary | ICD-10-CM | POA: Diagnosis not present

## 2020-07-07 DIAGNOSIS — I70201 Unspecified atherosclerosis of native arteries of extremities, right leg: Secondary | ICD-10-CM | POA: Diagnosis not present

## 2020-07-07 DIAGNOSIS — I120 Hypertensive chronic kidney disease with stage 5 chronic kidney disease or end stage renal disease: Secondary | ICD-10-CM | POA: Diagnosis not present

## 2020-07-07 DIAGNOSIS — Z992 Dependence on renal dialysis: Secondary | ICD-10-CM | POA: Diagnosis not present

## 2020-07-07 DIAGNOSIS — M79605 Pain in left leg: Secondary | ICD-10-CM | POA: Diagnosis not present

## 2020-07-07 DIAGNOSIS — N2581 Secondary hyperparathyroidism of renal origin: Secondary | ICD-10-CM | POA: Diagnosis not present

## 2020-07-07 DIAGNOSIS — G8929 Other chronic pain: Secondary | ICD-10-CM | POA: Diagnosis not present

## 2020-07-07 DIAGNOSIS — I5022 Chronic systolic (congestive) heart failure: Secondary | ICD-10-CM | POA: Diagnosis not present

## 2020-07-07 DIAGNOSIS — I4510 Unspecified right bundle-branch block: Secondary | ICD-10-CM | POA: Diagnosis not present

## 2020-07-07 DIAGNOSIS — I350 Nonrheumatic aortic (valve) stenosis: Secondary | ICD-10-CM | POA: Diagnosis not present

## 2020-07-07 DIAGNOSIS — I255 Ischemic cardiomyopathy: Secondary | ICD-10-CM | POA: Diagnosis not present

## 2020-07-07 DIAGNOSIS — R9431 Abnormal electrocardiogram [ECG] [EKG]: Secondary | ICD-10-CM | POA: Diagnosis not present

## 2020-07-07 DIAGNOSIS — I440 Atrioventricular block, first degree: Secondary | ICD-10-CM | POA: Diagnosis not present

## 2020-07-07 DIAGNOSIS — I2510 Atherosclerotic heart disease of native coronary artery without angina pectoris: Secondary | ICD-10-CM | POA: Diagnosis not present

## 2020-07-07 DIAGNOSIS — I252 Old myocardial infarction: Secondary | ICD-10-CM | POA: Diagnosis not present

## 2020-07-07 DIAGNOSIS — E785 Hyperlipidemia, unspecified: Secondary | ICD-10-CM | POA: Diagnosis not present

## 2020-07-08 DIAGNOSIS — R9431 Abnormal electrocardiogram [ECG] [EKG]: Secondary | ICD-10-CM | POA: Diagnosis not present

## 2020-07-08 DIAGNOSIS — I517 Cardiomegaly: Secondary | ICD-10-CM | POA: Diagnosis not present

## 2020-07-08 DIAGNOSIS — I120 Hypertensive chronic kidney disease with stage 5 chronic kidney disease or end stage renal disease: Secondary | ICD-10-CM | POA: Diagnosis not present

## 2020-07-08 DIAGNOSIS — I4510 Unspecified right bundle-branch block: Secondary | ICD-10-CM | POA: Diagnosis not present

## 2020-07-08 DIAGNOSIS — Z992 Dependence on renal dialysis: Secondary | ICD-10-CM | POA: Diagnosis not present

## 2020-07-08 DIAGNOSIS — I509 Heart failure, unspecified: Secondary | ICD-10-CM | POA: Diagnosis not present

## 2020-07-08 DIAGNOSIS — R079 Chest pain, unspecified: Secondary | ICD-10-CM | POA: Diagnosis not present

## 2020-07-08 DIAGNOSIS — I350 Nonrheumatic aortic (valve) stenosis: Secondary | ICD-10-CM | POA: Diagnosis not present

## 2020-07-08 DIAGNOSIS — I208 Other forms of angina pectoris: Secondary | ICD-10-CM | POA: Diagnosis not present

## 2020-07-08 DIAGNOSIS — I2510 Atherosclerotic heart disease of native coronary artery without angina pectoris: Secondary | ICD-10-CM | POA: Diagnosis not present

## 2020-07-08 DIAGNOSIS — I440 Atrioventricular block, first degree: Secondary | ICD-10-CM | POA: Diagnosis not present

## 2020-07-08 DIAGNOSIS — N186 End stage renal disease: Secondary | ICD-10-CM | POA: Diagnosis not present

## 2020-07-08 DIAGNOSIS — N2581 Secondary hyperparathyroidism of renal origin: Secondary | ICD-10-CM | POA: Diagnosis not present

## 2020-07-09 ENCOUNTER — Telehealth: Payer: Self-pay | Admitting: Family Medicine

## 2020-07-09 DIAGNOSIS — N2581 Secondary hyperparathyroidism of renal origin: Secondary | ICD-10-CM | POA: Diagnosis not present

## 2020-07-09 DIAGNOSIS — I252 Old myocardial infarction: Secondary | ICD-10-CM | POA: Diagnosis not present

## 2020-07-09 DIAGNOSIS — L989 Disorder of the skin and subcutaneous tissue, unspecified: Secondary | ICD-10-CM | POA: Diagnosis not present

## 2020-07-09 DIAGNOSIS — E785 Hyperlipidemia, unspecified: Secondary | ICD-10-CM | POA: Diagnosis not present

## 2020-07-09 DIAGNOSIS — G8929 Other chronic pain: Secondary | ICD-10-CM | POA: Diagnosis not present

## 2020-07-09 DIAGNOSIS — I77810 Thoracic aortic ectasia: Secondary | ICD-10-CM | POA: Diagnosis not present

## 2020-07-09 DIAGNOSIS — I440 Atrioventricular block, first degree: Secondary | ICD-10-CM | POA: Diagnosis not present

## 2020-07-09 DIAGNOSIS — Z992 Dependence on renal dialysis: Secondary | ICD-10-CM | POA: Diagnosis not present

## 2020-07-09 DIAGNOSIS — I120 Hypertensive chronic kidney disease with stage 5 chronic kidney disease or end stage renal disease: Secondary | ICD-10-CM | POA: Diagnosis not present

## 2020-07-09 DIAGNOSIS — I132 Hypertensive heart and chronic kidney disease with heart failure and with stage 5 chronic kidney disease, or end stage renal disease: Secondary | ICD-10-CM | POA: Diagnosis not present

## 2020-07-09 DIAGNOSIS — I5022 Chronic systolic (congestive) heart failure: Secondary | ICD-10-CM | POA: Diagnosis not present

## 2020-07-09 DIAGNOSIS — R7401 Elevation of levels of liver transaminase levels: Secondary | ICD-10-CM | POA: Diagnosis not present

## 2020-07-09 DIAGNOSIS — I350 Nonrheumatic aortic (valve) stenosis: Secondary | ICD-10-CM | POA: Diagnosis not present

## 2020-07-09 DIAGNOSIS — R9431 Abnormal electrocardiogram [ECG] [EKG]: Secondary | ICD-10-CM | POA: Diagnosis not present

## 2020-07-09 DIAGNOSIS — I4510 Unspecified right bundle-branch block: Secondary | ICD-10-CM | POA: Diagnosis not present

## 2020-07-09 DIAGNOSIS — N186 End stage renal disease: Secondary | ICD-10-CM | POA: Diagnosis not present

## 2020-07-09 DIAGNOSIS — R079 Chest pain, unspecified: Secondary | ICD-10-CM | POA: Diagnosis not present

## 2020-07-09 DIAGNOSIS — I255 Ischemic cardiomyopathy: Secondary | ICD-10-CM | POA: Diagnosis not present

## 2020-07-09 DIAGNOSIS — I083 Combined rheumatic disorders of mitral, aortic and tricuspid valves: Secondary | ICD-10-CM | POA: Diagnosis not present

## 2020-07-09 DIAGNOSIS — I2510 Atherosclerotic heart disease of native coronary artery without angina pectoris: Secondary | ICD-10-CM | POA: Diagnosis not present

## 2020-07-09 NOTE — Telephone Encounter (Signed)
Called patient regarding culture results, which have shown MRSA resistant to clindamycin, erythromycin, oxacillin, and tetracycline.  Patient was to be discharged on doxycycline, but I now see that this does not appear to have actually been sent in to his pharmacy (was prescribed inpatient, but I do not see actual outpatient Rx was sent).  Incidentally patient is currently at [facility] in [place] for a second opinion on his CAD and valve issues. He was admitted on Tuesday and is undergoing various procedures. I am not able to see all the records in Care Everywhere (cannot find H&P, for example) so I am not able to tell whether they are presently treating his skin infections.  Since he is inpatient, the simplest thing to do may be to just treat him with IV vancomycin. I offered to speak with his doctor (Dr. Winfred Burn). Patient states he will give our phone number [phone] to his doctor and ask him to call me here at the office to discuss.  I also called [facility] to try and speak with his physician, but was transferred to the nursing unit he is on. They provided me with the phone number to Dr. Andree Elk' office, but no one answered when I called.   Will wait to hear back from Dr. Andree Elk. [place] office staff is aware he should be calling me.  Leeanne Rio, MD

## 2020-07-09 NOTE — Telephone Encounter (Signed)
Patient's nurse Mardene Celeste at [facility] called me at patient's request. I explained his biopsy culture results growing MRSA, and that this has not been adequately treated. Reviewed sensitivities with her, and also explained they should be able to review the results in Care Everywhere to see sensitivities She is going to pass this along to his provider team. They have our phone number if they need to reach [place] with more questions.  Leeanne Rio, MD

## 2020-07-10 DIAGNOSIS — S4291XA Fracture of right shoulder girdle, part unspecified, initial encounter for closed fracture: Secondary | ICD-10-CM | POA: Diagnosis not present

## 2020-07-10 DIAGNOSIS — I252 Old myocardial infarction: Secondary | ICD-10-CM | POA: Diagnosis not present

## 2020-07-10 DIAGNOSIS — R627 Adult failure to thrive: Secondary | ICD-10-CM | POA: Diagnosis not present

## 2020-07-10 DIAGNOSIS — I444 Left anterior fascicular block: Secondary | ICD-10-CM | POA: Diagnosis not present

## 2020-07-10 DIAGNOSIS — I452 Bifascicular block: Secondary | ICD-10-CM | POA: Diagnosis not present

## 2020-07-10 DIAGNOSIS — I517 Cardiomegaly: Secondary | ICD-10-CM | POA: Diagnosis not present

## 2020-07-10 DIAGNOSIS — Z992 Dependence on renal dialysis: Secondary | ICD-10-CM | POA: Diagnosis not present

## 2020-07-10 DIAGNOSIS — I350 Nonrheumatic aortic (valve) stenosis: Secondary | ICD-10-CM | POA: Diagnosis not present

## 2020-07-10 DIAGNOSIS — N186 End stage renal disease: Secondary | ICD-10-CM | POA: Diagnosis not present

## 2020-07-10 DIAGNOSIS — I4510 Unspecified right bundle-branch block: Secondary | ICD-10-CM | POA: Diagnosis not present

## 2020-07-10 DIAGNOSIS — I120 Hypertensive chronic kidney disease with stage 5 chronic kidney disease or end stage renal disease: Secondary | ICD-10-CM | POA: Diagnosis not present

## 2020-07-10 DIAGNOSIS — I2510 Atherosclerotic heart disease of native coronary artery without angina pectoris: Secondary | ICD-10-CM | POA: Diagnosis not present

## 2020-07-10 DIAGNOSIS — B9562 Methicillin resistant Staphylococcus aureus infection as the cause of diseases classified elsewhere: Secondary | ICD-10-CM | POA: Diagnosis not present

## 2020-07-10 DIAGNOSIS — R9431 Abnormal electrocardiogram [ECG] [EKG]: Secondary | ICD-10-CM | POA: Diagnosis not present

## 2020-07-10 DIAGNOSIS — N2581 Secondary hyperparathyroidism of renal origin: Secondary | ICD-10-CM | POA: Diagnosis not present

## 2020-07-11 DIAGNOSIS — N186 End stage renal disease: Secondary | ICD-10-CM | POA: Diagnosis not present

## 2020-07-11 DIAGNOSIS — N2581 Secondary hyperparathyroidism of renal origin: Secondary | ICD-10-CM | POA: Diagnosis not present

## 2020-07-11 DIAGNOSIS — I120 Hypertensive chronic kidney disease with stage 5 chronic kidney disease or end stage renal disease: Secondary | ICD-10-CM | POA: Diagnosis not present

## 2020-07-11 DIAGNOSIS — S42124A Nondisplaced fracture of acromial process, right shoulder, initial encounter for closed fracture: Secondary | ICD-10-CM | POA: Diagnosis not present

## 2020-07-11 DIAGNOSIS — I2510 Atherosclerotic heart disease of native coronary artery without angina pectoris: Secondary | ICD-10-CM | POA: Diagnosis not present

## 2020-07-11 DIAGNOSIS — Z992 Dependence on renal dialysis: Secondary | ICD-10-CM | POA: Diagnosis not present

## 2020-07-11 DIAGNOSIS — I350 Nonrheumatic aortic (valve) stenosis: Secondary | ICD-10-CM | POA: Diagnosis not present

## 2020-07-12 DIAGNOSIS — E875 Hyperkalemia: Secondary | ICD-10-CM | POA: Diagnosis not present

## 2020-07-12 DIAGNOSIS — I132 Hypertensive heart and chronic kidney disease with heart failure and with stage 5 chronic kidney disease, or end stage renal disease: Secondary | ICD-10-CM | POA: Diagnosis not present

## 2020-07-12 DIAGNOSIS — I2510 Atherosclerotic heart disease of native coronary artery without angina pectoris: Secondary | ICD-10-CM | POA: Diagnosis not present

## 2020-07-12 DIAGNOSIS — I5043 Acute on chronic combined systolic (congestive) and diastolic (congestive) heart failure: Secondary | ICD-10-CM | POA: Diagnosis not present

## 2020-07-12 DIAGNOSIS — I440 Atrioventricular block, first degree: Secondary | ICD-10-CM | POA: Diagnosis not present

## 2020-07-12 DIAGNOSIS — I130 Hypertensive heart and chronic kidney disease with heart failure and stage 1 through stage 4 chronic kidney disease, or unspecified chronic kidney disease: Secondary | ICD-10-CM | POA: Diagnosis not present

## 2020-07-12 DIAGNOSIS — F17210 Nicotine dependence, cigarettes, uncomplicated: Secondary | ICD-10-CM | POA: Diagnosis not present

## 2020-07-12 DIAGNOSIS — R9431 Abnormal electrocardiogram [ECG] [EKG]: Secondary | ICD-10-CM | POA: Diagnosis not present

## 2020-07-12 DIAGNOSIS — I5022 Chronic systolic (congestive) heart failure: Secondary | ICD-10-CM | POA: Diagnosis not present

## 2020-07-12 DIAGNOSIS — I5023 Acute on chronic systolic (congestive) heart failure: Secondary | ICD-10-CM | POA: Diagnosis not present

## 2020-07-12 DIAGNOSIS — F17200 Nicotine dependence, unspecified, uncomplicated: Secondary | ICD-10-CM | POA: Diagnosis not present

## 2020-07-12 DIAGNOSIS — I739 Peripheral vascular disease, unspecified: Secondary | ICD-10-CM | POA: Diagnosis not present

## 2020-07-12 DIAGNOSIS — I428 Other cardiomyopathies: Secondary | ICD-10-CM | POA: Diagnosis not present

## 2020-07-12 DIAGNOSIS — R7303 Prediabetes: Secondary | ICD-10-CM | POA: Diagnosis not present

## 2020-07-12 DIAGNOSIS — E785 Hyperlipidemia, unspecified: Secondary | ICD-10-CM | POA: Diagnosis not present

## 2020-07-12 DIAGNOSIS — Z955 Presence of coronary angioplasty implant and graft: Secondary | ICD-10-CM | POA: Diagnosis not present

## 2020-07-12 DIAGNOSIS — Z992 Dependence on renal dialysis: Secondary | ICD-10-CM | POA: Diagnosis not present

## 2020-07-12 DIAGNOSIS — I120 Hypertensive chronic kidney disease with stage 5 chronic kidney disease or end stage renal disease: Secondary | ICD-10-CM | POA: Diagnosis not present

## 2020-07-12 DIAGNOSIS — E871 Hypo-osmolality and hyponatremia: Secondary | ICD-10-CM | POA: Diagnosis not present

## 2020-07-12 DIAGNOSIS — E8770 Fluid overload, unspecified: Secondary | ICD-10-CM | POA: Diagnosis not present

## 2020-07-12 DIAGNOSIS — I255 Ischemic cardiomyopathy: Secondary | ICD-10-CM | POA: Diagnosis not present

## 2020-07-12 DIAGNOSIS — D649 Anemia, unspecified: Secondary | ICD-10-CM | POA: Diagnosis not present

## 2020-07-12 DIAGNOSIS — K5790 Diverticulosis of intestine, part unspecified, without perforation or abscess without bleeding: Secondary | ICD-10-CM | POA: Diagnosis not present

## 2020-07-12 DIAGNOSIS — I350 Nonrheumatic aortic (valve) stenosis: Secondary | ICD-10-CM | POA: Diagnosis not present

## 2020-07-12 DIAGNOSIS — I4510 Unspecified right bundle-branch block: Secondary | ICD-10-CM | POA: Diagnosis not present

## 2020-07-12 DIAGNOSIS — I517 Cardiomegaly: Secondary | ICD-10-CM | POA: Diagnosis not present

## 2020-07-12 DIAGNOSIS — N186 End stage renal disease: Secondary | ICD-10-CM | POA: Diagnosis not present

## 2020-07-13 DIAGNOSIS — I350 Nonrheumatic aortic (valve) stenosis: Secondary | ICD-10-CM | POA: Diagnosis not present

## 2020-07-13 DIAGNOSIS — Z992 Dependence on renal dialysis: Secondary | ICD-10-CM | POA: Diagnosis not present

## 2020-07-13 DIAGNOSIS — R933 Abnormal findings on diagnostic imaging of other parts of digestive tract: Secondary | ICD-10-CM | POA: Diagnosis not present

## 2020-07-13 DIAGNOSIS — E871 Hypo-osmolality and hyponatremia: Secondary | ICD-10-CM | POA: Diagnosis not present

## 2020-07-13 DIAGNOSIS — D649 Anemia, unspecified: Secondary | ICD-10-CM | POA: Diagnosis not present

## 2020-07-13 DIAGNOSIS — K5900 Constipation, unspecified: Secondary | ICD-10-CM | POA: Diagnosis not present

## 2020-07-13 DIAGNOSIS — S41109A Unspecified open wound of unspecified upper arm, initial encounter: Secondary | ICD-10-CM | POA: Diagnosis not present

## 2020-07-13 DIAGNOSIS — F17200 Nicotine dependence, unspecified, uncomplicated: Secondary | ICD-10-CM | POA: Diagnosis not present

## 2020-07-13 DIAGNOSIS — I255 Ischemic cardiomyopathy: Secondary | ICD-10-CM | POA: Diagnosis not present

## 2020-07-13 DIAGNOSIS — I739 Peripheral vascular disease, unspecified: Secondary | ICD-10-CM | POA: Diagnosis not present

## 2020-07-13 DIAGNOSIS — K625 Hemorrhage of anus and rectum: Secondary | ICD-10-CM | POA: Diagnosis not present

## 2020-07-13 DIAGNOSIS — I5043 Acute on chronic combined systolic (congestive) and diastolic (congestive) heart failure: Secondary | ICD-10-CM | POA: Diagnosis not present

## 2020-07-13 DIAGNOSIS — S81809A Unspecified open wound, unspecified lower leg, initial encounter: Secondary | ICD-10-CM | POA: Diagnosis not present

## 2020-07-13 DIAGNOSIS — I5022 Chronic systolic (congestive) heart failure: Secondary | ICD-10-CM | POA: Diagnosis not present

## 2020-07-13 DIAGNOSIS — I120 Hypertensive chronic kidney disease with stage 5 chronic kidney disease or end stage renal disease: Secondary | ICD-10-CM | POA: Diagnosis not present

## 2020-07-13 DIAGNOSIS — E785 Hyperlipidemia, unspecified: Secondary | ICD-10-CM | POA: Diagnosis not present

## 2020-07-13 DIAGNOSIS — K5732 Diverticulitis of large intestine without perforation or abscess without bleeding: Secondary | ICD-10-CM | POA: Diagnosis not present

## 2020-07-13 DIAGNOSIS — N182 Chronic kidney disease, stage 2 (mild): Secondary | ICD-10-CM | POA: Diagnosis not present

## 2020-07-13 DIAGNOSIS — I5023 Acute on chronic systolic (congestive) heart failure: Secondary | ICD-10-CM | POA: Diagnosis not present

## 2020-07-13 DIAGNOSIS — K5790 Diverticulosis of intestine, part unspecified, without perforation or abscess without bleeding: Secondary | ICD-10-CM | POA: Diagnosis not present

## 2020-07-13 DIAGNOSIS — E875 Hyperkalemia: Secondary | ICD-10-CM | POA: Diagnosis not present

## 2020-07-13 DIAGNOSIS — N186 End stage renal disease: Secondary | ICD-10-CM | POA: Diagnosis not present

## 2020-07-13 DIAGNOSIS — K651 Peritoneal abscess: Secondary | ICD-10-CM | POA: Diagnosis not present

## 2020-07-13 DIAGNOSIS — E8770 Fluid overload, unspecified: Secondary | ICD-10-CM | POA: Diagnosis not present

## 2020-07-13 DIAGNOSIS — I2510 Atherosclerotic heart disease of native coronary artery without angina pectoris: Secondary | ICD-10-CM | POA: Diagnosis not present

## 2020-07-13 DIAGNOSIS — I428 Other cardiomyopathies: Secondary | ICD-10-CM | POA: Diagnosis not present

## 2020-07-13 DIAGNOSIS — I132 Hypertensive heart and chronic kidney disease with heart failure and with stage 5 chronic kidney disease, or end stage renal disease: Secondary | ICD-10-CM | POA: Diagnosis not present

## 2020-07-14 DIAGNOSIS — I2510 Atherosclerotic heart disease of native coronary artery without angina pectoris: Secondary | ICD-10-CM | POA: Diagnosis not present

## 2020-07-14 DIAGNOSIS — I350 Nonrheumatic aortic (valve) stenosis: Secondary | ICD-10-CM | POA: Diagnosis not present

## 2020-07-14 DIAGNOSIS — E785 Hyperlipidemia, unspecified: Secondary | ICD-10-CM | POA: Diagnosis not present

## 2020-07-14 DIAGNOSIS — I120 Hypertensive chronic kidney disease with stage 5 chronic kidney disease or end stage renal disease: Secondary | ICD-10-CM | POA: Diagnosis not present

## 2020-07-14 DIAGNOSIS — Z6827 Body mass index (BMI) 27.0-27.9, adult: Secondary | ICD-10-CM | POA: Diagnosis not present

## 2020-07-14 DIAGNOSIS — I132 Hypertensive heart and chronic kidney disease with heart failure and with stage 5 chronic kidney disease, or end stage renal disease: Secondary | ICD-10-CM | POA: Diagnosis not present

## 2020-07-14 DIAGNOSIS — Z992 Dependence on renal dialysis: Secondary | ICD-10-CM | POA: Diagnosis not present

## 2020-07-14 DIAGNOSIS — K625 Hemorrhage of anus and rectum: Secondary | ICD-10-CM | POA: Diagnosis not present

## 2020-07-14 DIAGNOSIS — I5022 Chronic systolic (congestive) heart failure: Secondary | ICD-10-CM | POA: Diagnosis not present

## 2020-07-14 DIAGNOSIS — N186 End stage renal disease: Secondary | ICD-10-CM | POA: Diagnosis not present

## 2020-07-15 ENCOUNTER — Encounter (HOSPITAL_COMMUNITY): Payer: Medicare Other | Admitting: Cardiology

## 2020-07-15 DIAGNOSIS — E785 Hyperlipidemia, unspecified: Secondary | ICD-10-CM | POA: Diagnosis not present

## 2020-07-15 DIAGNOSIS — I255 Ischemic cardiomyopathy: Secondary | ICD-10-CM | POA: Diagnosis not present

## 2020-07-15 DIAGNOSIS — I4510 Unspecified right bundle-branch block: Secondary | ICD-10-CM | POA: Diagnosis not present

## 2020-07-15 DIAGNOSIS — I517 Cardiomegaly: Secondary | ICD-10-CM | POA: Diagnosis not present

## 2020-07-15 DIAGNOSIS — I350 Nonrheumatic aortic (valve) stenosis: Secondary | ICD-10-CM | POA: Diagnosis not present

## 2020-07-15 DIAGNOSIS — I120 Hypertensive chronic kidney disease with stage 5 chronic kidney disease or end stage renal disease: Secondary | ICD-10-CM | POA: Diagnosis not present

## 2020-07-15 DIAGNOSIS — I132 Hypertensive heart and chronic kidney disease with heart failure and with stage 5 chronic kidney disease, or end stage renal disease: Secondary | ICD-10-CM | POA: Diagnosis not present

## 2020-07-15 DIAGNOSIS — N186 End stage renal disease: Secondary | ICD-10-CM | POA: Diagnosis not present

## 2020-07-15 DIAGNOSIS — I5022 Chronic systolic (congestive) heart failure: Secondary | ICD-10-CM | POA: Diagnosis not present

## 2020-07-15 DIAGNOSIS — I252 Old myocardial infarction: Secondary | ICD-10-CM | POA: Diagnosis not present

## 2020-07-15 DIAGNOSIS — I440 Atrioventricular block, first degree: Secondary | ICD-10-CM | POA: Diagnosis not present

## 2020-07-15 DIAGNOSIS — Z992 Dependence on renal dialysis: Secondary | ICD-10-CM | POA: Diagnosis not present

## 2020-07-15 DIAGNOSIS — F17200 Nicotine dependence, unspecified, uncomplicated: Secondary | ICD-10-CM | POA: Diagnosis not present

## 2020-07-15 DIAGNOSIS — R9431 Abnormal electrocardiogram [ECG] [EKG]: Secondary | ICD-10-CM | POA: Diagnosis not present

## 2020-07-15 DIAGNOSIS — I2510 Atherosclerotic heart disease of native coronary artery without angina pectoris: Secondary | ICD-10-CM | POA: Diagnosis not present

## 2020-07-16 DIAGNOSIS — I70235 Atherosclerosis of native arteries of right leg with ulceration of other part of foot: Secondary | ICD-10-CM | POA: Diagnosis not present

## 2020-07-16 DIAGNOSIS — I2510 Atherosclerotic heart disease of native coronary artery without angina pectoris: Secondary | ICD-10-CM | POA: Diagnosis not present

## 2020-07-16 DIAGNOSIS — I5043 Acute on chronic combined systolic (congestive) and diastolic (congestive) heart failure: Secondary | ICD-10-CM | POA: Diagnosis not present

## 2020-07-16 DIAGNOSIS — I4510 Unspecified right bundle-branch block: Secondary | ICD-10-CM | POA: Diagnosis not present

## 2020-07-16 DIAGNOSIS — I350 Nonrheumatic aortic (valve) stenosis: Secondary | ICD-10-CM | POA: Diagnosis not present

## 2020-07-16 DIAGNOSIS — I255 Ischemic cardiomyopathy: Secondary | ICD-10-CM | POA: Diagnosis not present

## 2020-07-16 DIAGNOSIS — Z992 Dependence on renal dialysis: Secondary | ICD-10-CM | POA: Diagnosis not present

## 2020-07-16 DIAGNOSIS — I5022 Chronic systolic (congestive) heart failure: Secondary | ICD-10-CM | POA: Diagnosis not present

## 2020-07-16 DIAGNOSIS — N186 End stage renal disease: Secondary | ICD-10-CM | POA: Diagnosis not present

## 2020-07-16 DIAGNOSIS — I517 Cardiomegaly: Secondary | ICD-10-CM | POA: Diagnosis not present

## 2020-07-16 DIAGNOSIS — I440 Atrioventricular block, first degree: Secondary | ICD-10-CM | POA: Diagnosis not present

## 2020-07-16 DIAGNOSIS — G894 Chronic pain syndrome: Secondary | ICD-10-CM | POA: Diagnosis not present

## 2020-07-16 DIAGNOSIS — R9431 Abnormal electrocardiogram [ECG] [EKG]: Secondary | ICD-10-CM | POA: Diagnosis not present

## 2020-07-16 DIAGNOSIS — I70245 Atherosclerosis of native arteries of left leg with ulceration of other part of foot: Secondary | ICD-10-CM | POA: Diagnosis not present

## 2020-07-16 DIAGNOSIS — E785 Hyperlipidemia, unspecified: Secondary | ICD-10-CM | POA: Diagnosis not present

## 2020-07-16 DIAGNOSIS — R7401 Elevation of levels of liver transaminase levels: Secondary | ICD-10-CM | POA: Diagnosis not present

## 2020-07-16 DIAGNOSIS — I120 Hypertensive chronic kidney disease with stage 5 chronic kidney disease or end stage renal disease: Secondary | ICD-10-CM | POA: Diagnosis not present

## 2020-07-16 DIAGNOSIS — R0789 Other chest pain: Secondary | ICD-10-CM | POA: Diagnosis not present

## 2020-07-16 DIAGNOSIS — I10 Essential (primary) hypertension: Secondary | ICD-10-CM | POA: Diagnosis not present

## 2020-07-17 DIAGNOSIS — I2510 Atherosclerotic heart disease of native coronary artery without angina pectoris: Secondary | ICD-10-CM | POA: Diagnosis not present

## 2020-07-17 DIAGNOSIS — K5732 Diverticulitis of large intestine without perforation or abscess without bleeding: Secondary | ICD-10-CM | POA: Diagnosis not present

## 2020-07-17 DIAGNOSIS — I272 Pulmonary hypertension, unspecified: Secondary | ICD-10-CM | POA: Diagnosis not present

## 2020-07-17 DIAGNOSIS — I120 Hypertensive chronic kidney disease with stage 5 chronic kidney disease or end stage renal disease: Secondary | ICD-10-CM | POA: Diagnosis not present

## 2020-07-17 DIAGNOSIS — Z992 Dependence on renal dialysis: Secondary | ICD-10-CM | POA: Diagnosis not present

## 2020-07-17 DIAGNOSIS — N186 End stage renal disease: Secondary | ICD-10-CM | POA: Diagnosis not present

## 2020-07-17 DIAGNOSIS — I071 Rheumatic tricuspid insufficiency: Secondary | ICD-10-CM | POA: Diagnosis not present

## 2020-07-17 DIAGNOSIS — I313 Pericardial effusion (noninflammatory): Secondary | ICD-10-CM | POA: Diagnosis not present

## 2020-07-18 DIAGNOSIS — N2581 Secondary hyperparathyroidism of renal origin: Secondary | ICD-10-CM | POA: Diagnosis not present

## 2020-07-18 DIAGNOSIS — N186 End stage renal disease: Secondary | ICD-10-CM | POA: Diagnosis not present

## 2020-07-18 DIAGNOSIS — Z992 Dependence on renal dialysis: Secondary | ICD-10-CM | POA: Diagnosis not present

## 2020-07-18 DIAGNOSIS — E8779 Other fluid overload: Secondary | ICD-10-CM | POA: Diagnosis not present

## 2020-07-20 DIAGNOSIS — E8779 Other fluid overload: Secondary | ICD-10-CM | POA: Diagnosis not present

## 2020-07-20 DIAGNOSIS — N186 End stage renal disease: Secondary | ICD-10-CM | POA: Diagnosis not present

## 2020-07-20 DIAGNOSIS — N2581 Secondary hyperparathyroidism of renal origin: Secondary | ICD-10-CM | POA: Diagnosis not present

## 2020-07-20 DIAGNOSIS — Z992 Dependence on renal dialysis: Secondary | ICD-10-CM | POA: Diagnosis not present

## 2020-07-23 DIAGNOSIS — N2581 Secondary hyperparathyroidism of renal origin: Secondary | ICD-10-CM | POA: Diagnosis not present

## 2020-07-23 DIAGNOSIS — Z992 Dependence on renal dialysis: Secondary | ICD-10-CM | POA: Diagnosis not present

## 2020-07-23 DIAGNOSIS — E8779 Other fluid overload: Secondary | ICD-10-CM | POA: Diagnosis not present

## 2020-07-23 DIAGNOSIS — N186 End stage renal disease: Secondary | ICD-10-CM | POA: Diagnosis not present

## 2020-07-24 DIAGNOSIS — N186 End stage renal disease: Secondary | ICD-10-CM | POA: Diagnosis not present

## 2020-07-24 DIAGNOSIS — Z992 Dependence on renal dialysis: Secondary | ICD-10-CM | POA: Diagnosis not present

## 2020-07-24 DIAGNOSIS — N2581 Secondary hyperparathyroidism of renal origin: Secondary | ICD-10-CM | POA: Diagnosis not present

## 2020-07-24 DIAGNOSIS — E8779 Other fluid overload: Secondary | ICD-10-CM | POA: Diagnosis not present

## 2020-07-26 ENCOUNTER — Emergency Department (HOSPITAL_COMMUNITY): Payer: Medicare Other

## 2020-07-26 ENCOUNTER — Observation Stay (HOSPITAL_COMMUNITY): Payer: Medicare Other

## 2020-07-26 ENCOUNTER — Observation Stay (HOSPITAL_COMMUNITY)
Admission: EM | Admit: 2020-07-26 | Discharge: 2020-07-26 | Disposition: A | Discharge status: Home / Self Care | Payer: Medicare Other | Attending: Pulmonary Disease | Admitting: Pulmonary Disease

## 2020-07-26 DIAGNOSIS — I2510 Atherosclerotic heart disease of native coronary artery without angina pectoris: Secondary | ICD-10-CM | POA: Diagnosis not present

## 2020-07-26 DIAGNOSIS — G9340 Encephalopathy, unspecified: Secondary | ICD-10-CM | POA: Insufficient documentation

## 2020-07-26 DIAGNOSIS — I469 Cardiac arrest, cause unspecified: Secondary | ICD-10-CM | POA: Diagnosis not present

## 2020-07-26 DIAGNOSIS — I5022 Chronic systolic (congestive) heart failure: Secondary | ICD-10-CM | POA: Diagnosis not present

## 2020-07-26 DIAGNOSIS — N186 End stage renal disease: Secondary | ICD-10-CM | POA: Diagnosis not present

## 2020-07-26 DIAGNOSIS — Z992 Dependence on renal dialysis: Secondary | ICD-10-CM | POA: Diagnosis not present

## 2020-07-26 DIAGNOSIS — F17210 Nicotine dependence, cigarettes, uncomplicated: Secondary | ICD-10-CM | POA: Insufficient documentation

## 2020-07-26 DIAGNOSIS — K651 Peritoneal abscess: Secondary | ICD-10-CM | POA: Insufficient documentation

## 2020-07-26 DIAGNOSIS — R410 Disorientation, unspecified: Secondary | ICD-10-CM | POA: Diagnosis not present

## 2020-07-26 DIAGNOSIS — R531 Weakness: Secondary | ICD-10-CM | POA: Diagnosis not present

## 2020-07-26 DIAGNOSIS — R778 Other specified abnormalities of plasma proteins: Secondary | ICD-10-CM

## 2020-07-26 DIAGNOSIS — R4182 Altered mental status, unspecified: Secondary | ICD-10-CM | POA: Diagnosis present

## 2020-07-26 DIAGNOSIS — B9562 Methicillin resistant Staphylococcus aureus infection as the cause of diseases classified elsewhere: Secondary | ICD-10-CM | POA: Insufficient documentation

## 2020-07-26 DIAGNOSIS — Z79899 Other long term (current) drug therapy: Secondary | ICD-10-CM | POA: Insufficient documentation

## 2020-07-26 DIAGNOSIS — R7401 Elevation of levels of liver transaminase levels: Secondary | ICD-10-CM | POA: Insufficient documentation

## 2020-07-26 DIAGNOSIS — E875 Hyperkalemia: Secondary | ICD-10-CM | POA: Insufficient documentation

## 2020-07-26 DIAGNOSIS — I350 Nonrheumatic aortic (valve) stenosis: Secondary | ICD-10-CM | POA: Diagnosis not present

## 2020-07-26 DIAGNOSIS — R748 Abnormal levels of other serum enzymes: Secondary | ICD-10-CM | POA: Diagnosis not present

## 2020-07-26 DIAGNOSIS — Z7982 Long term (current) use of aspirin: Secondary | ICD-10-CM | POA: Diagnosis not present

## 2020-07-26 DIAGNOSIS — D631 Anemia in chronic kidney disease: Secondary | ICD-10-CM | POA: Insufficient documentation

## 2020-07-26 DIAGNOSIS — I10 Essential (primary) hypertension: Secondary | ICD-10-CM | POA: Diagnosis not present

## 2020-07-26 DIAGNOSIS — Z4659 Encounter for fitting and adjustment of other gastrointestinal appliance and device: Secondary | ICD-10-CM

## 2020-07-26 DIAGNOSIS — I25118 Atherosclerotic heart disease of native coronary artery with other forms of angina pectoris: Secondary | ICD-10-CM

## 2020-07-26 DIAGNOSIS — R52 Pain, unspecified: Secondary | ICD-10-CM | POA: Diagnosis not present

## 2020-07-26 DIAGNOSIS — R392 Extrarenal uremia: Secondary | ICD-10-CM | POA: Diagnosis not present

## 2020-07-26 DIAGNOSIS — R404 Transient alteration of awareness: Secondary | ICD-10-CM | POA: Diagnosis not present

## 2020-07-26 DIAGNOSIS — R42 Dizziness and giddiness: Secondary | ICD-10-CM | POA: Diagnosis not present

## 2020-07-26 DIAGNOSIS — I5020 Unspecified systolic (congestive) heart failure: Secondary | ICD-10-CM | POA: Diagnosis not present

## 2020-07-26 DIAGNOSIS — Z20822 Contact with and (suspected) exposure to covid-19: Secondary | ICD-10-CM | POA: Diagnosis not present

## 2020-07-26 DIAGNOSIS — E872 Acidosis, unspecified: Secondary | ICD-10-CM

## 2020-07-26 DIAGNOSIS — I132 Hypertensive heart and chronic kidney disease with heart failure and with stage 5 chronic kidney disease, or end stage renal disease: Secondary | ICD-10-CM | POA: Diagnosis not present

## 2020-07-26 DIAGNOSIS — G9341 Metabolic encephalopathy: Secondary | ICD-10-CM | POA: Diagnosis not present

## 2020-07-26 DIAGNOSIS — R109 Unspecified abdominal pain: Secondary | ICD-10-CM | POA: Diagnosis not present

## 2020-07-26 DIAGNOSIS — R1084 Generalized abdominal pain: Secondary | ICD-10-CM | POA: Diagnosis not present

## 2020-07-26 LAB — POCT I-STAT EG7
Acid-base deficit: 1 mmol/L (ref 0.0–2.0)
Bicarbonate: 21.8 mmol/L (ref 20.0–28.0)
Calcium, Ion: 1.18 mmol/L (ref 1.15–1.40)
HCT: 30 % — ABNORMAL LOW (ref 39.0–52.0)
Hemoglobin: 10.2 g/dL — ABNORMAL LOW (ref 13.0–17.0)
O2 Saturation: 100 %
Potassium: 6.2 mmol/L — ABNORMAL HIGH (ref 3.5–5.1)
Sodium: 134 mmol/L — ABNORMAL LOW (ref 135–145)
TCO2: 23 mmol/L (ref 22–32)
pCO2, Ven: 29 mmHg — ABNORMAL LOW (ref 44.0–60.0)
pH, Ven: 7.483 — ABNORMAL HIGH (ref 7.250–7.430)
pO2, Ven: 174 mmHg — ABNORMAL HIGH (ref 32.0–45.0)

## 2020-07-26 LAB — CBC WITH DIFFERENTIAL/PLATELET
Abs Immature Granulocytes: 0.53 10*3/uL — ABNORMAL HIGH (ref 0.00–0.07)
Basophils Absolute: 0 10*3/uL (ref 0.0–0.1)
Basophils Relative: 0 %
Eosinophils Absolute: 0 10*3/uL (ref 0.0–0.5)
Eosinophils Relative: 0 %
HCT: 31.4 % — ABNORMAL LOW (ref 39.0–52.0)
Hemoglobin: 9.9 g/dL — ABNORMAL LOW (ref 13.0–17.0)
Immature Granulocytes: 3 %
Lymphocytes Relative: 3 %
Lymphs Abs: 0.5 10*3/uL — ABNORMAL LOW (ref 0.7–4.0)
MCH: 27 pg (ref 26.0–34.0)
MCHC: 31.5 g/dL (ref 30.0–36.0)
MCV: 85.6 fL (ref 80.0–100.0)
Monocytes Absolute: 1.2 10*3/uL — ABNORMAL HIGH (ref 0.1–1.0)
Monocytes Relative: 6 %
Neutro Abs: 16.9 10*3/uL — ABNORMAL HIGH (ref 1.7–7.7)
Neutrophils Relative %: 88 %
Platelets: 107 10*3/uL — ABNORMAL LOW (ref 150–400)
RBC: 3.67 MIL/uL — ABNORMAL LOW (ref 4.22–5.81)
RDW: 17.3 % — ABNORMAL HIGH (ref 11.5–15.5)
WBC: 19.1 10*3/uL — ABNORMAL HIGH (ref 4.0–10.5)
nRBC: 0 % (ref 0.0–0.2)

## 2020-07-26 LAB — POCT I-STAT, CHEM 8
BUN: 34 mg/dL — ABNORMAL HIGH (ref 6–20)
Calcium, Ion: 1.19 mmol/L (ref 1.15–1.40)
Chloride: 96 mmol/L — ABNORMAL LOW (ref 98–111)
Creatinine, Ser: 4.7 mg/dL — ABNORMAL HIGH (ref 0.61–1.24)
Glucose, Bld: 91 mg/dL (ref 70–99)
HCT: 33 % — ABNORMAL LOW (ref 39.0–52.0)
Hemoglobin: 11.2 g/dL — ABNORMAL LOW (ref 13.0–17.0)
Potassium: 6.3 mmol/L (ref 3.5–5.1)
Sodium: 134 mmol/L — ABNORMAL LOW (ref 135–145)
TCO2: 21 mmol/L — ABNORMAL LOW (ref 22–32)

## 2020-07-26 LAB — VITAMIN B12: Vitamin B-12: 685 pg/mL (ref 180–914)

## 2020-07-26 LAB — BASIC METABOLIC PANEL
Anion gap: 24 — ABNORMAL HIGH (ref 5–15)
BUN: 36 mg/dL — ABNORMAL HIGH (ref 6–20)
CO2: 17 mmol/L — ABNORMAL LOW (ref 22–32)
Calcium: 9.7 mg/dL (ref 8.9–10.3)
Chloride: 94 mmol/L — ABNORMAL LOW (ref 98–111)
Creatinine, Ser: 4.64 mg/dL — ABNORMAL HIGH (ref 0.61–1.24)
GFR, Estimated: 14 mL/min — ABNORMAL LOW (ref >60)
Glucose, Bld: 94 mg/dL (ref 70–99)
Potassium: 6.3 mmol/L (ref 3.5–5.1)
Sodium: 135 mmol/L (ref 135–145)

## 2020-07-26 LAB — I-STAT CHEM 8, ED
BUN: 81 mg/dL — ABNORMAL HIGH (ref 6–20)
Calcium, Ion: 1.14 mmol/L — ABNORMAL LOW (ref 1.15–1.40)
Chloride: 93 mmol/L — ABNORMAL LOW (ref 98–111)
Creatinine, Ser: 8.8 mg/dL — ABNORMAL HIGH (ref 0.61–1.24)
Glucose, Bld: 130 mg/dL — ABNORMAL HIGH (ref 70–99)
HCT: 30 % — ABNORMAL LOW (ref 39.0–52.0)
Hemoglobin: 10.2 g/dL — ABNORMAL LOW (ref 13.0–17.0)
Potassium: 6.7 mmol/L (ref 3.5–5.1)
Sodium: 130 mmol/L — ABNORMAL LOW (ref 135–145)
TCO2: 27 mmol/L (ref 22–32)

## 2020-07-26 LAB — LACTIC ACID, PLASMA
Lactic Acid, Venous: 4.3 mmol/L (ref 0.5–1.9)
Lactic Acid, Venous: 6.2 mmol/L (ref 0.5–1.9)
Lactic Acid, Venous: 1.7 mmol/L (ref 0.5–1.9)
Lactic Acid, Venous: 9.3 mmol/L (ref 0.5–1.9)

## 2020-07-26 LAB — BLOOD GAS, ARTERIAL
Acid-Base Excess: 8.1 mmol/L — ABNORMAL HIGH (ref 0.0–2.0)
Bicarbonate: 31.1 mmol/L — ABNORMAL HIGH (ref 20.0–28.0)
Drawn by: 30136
FIO2: 44
O2 Saturation: 96.9 %
Patient temperature: 36.3
pCO2 arterial: 34.8 mmHg (ref 32.0–48.0)
pH, Arterial: 7.557 — ABNORMAL HIGH (ref 7.350–7.450)
pO2, Arterial: 89.7 mmHg (ref 83.0–108.0)

## 2020-07-26 LAB — COMPREHENSIVE METABOLIC PANEL
ALT: 49 U/L — ABNORMAL HIGH (ref 0–44)
AST: 79 U/L — ABNORMAL HIGH (ref 15–41)
Albumin: 2.1 g/dL — ABNORMAL LOW (ref 3.5–5.0)
Alkaline Phosphatase: 236 U/L — ABNORMAL HIGH (ref 38–126)
Anion gap: 23 — ABNORMAL HIGH (ref 5–15)
BUN: 78 mg/dL — ABNORMAL HIGH (ref 6–20)
CO2: 25 mmol/L (ref 22–32)
Calcium: 9.9 mg/dL (ref 8.9–10.3)
Chloride: 86 mmol/L — ABNORMAL LOW (ref 98–111)
Creatinine, Ser: 8.58 mg/dL — ABNORMAL HIGH (ref 0.61–1.24)
GFR, Estimated: 7 mL/min — ABNORMAL LOW (ref >60)
Glucose, Bld: 133 mg/dL — ABNORMAL HIGH (ref 70–99)
Potassium: 6.8 mmol/L (ref 3.5–5.1)
Sodium: 134 mmol/L — ABNORMAL LOW (ref 135–145)
Total Bilirubin: 1.5 mg/dL — ABNORMAL HIGH (ref 0.3–1.2)
Total Protein: 6.7 g/dL (ref 6.5–8.1)

## 2020-07-26 LAB — RESP PANEL BY RT-PCR (FLU A&B, COVID) ARPGX2
Influenza A by PCR: NEGATIVE
Influenza B by PCR: NEGATIVE
SARS Coronavirus 2 by RT PCR: NEGATIVE

## 2020-07-26 LAB — ETHANOL: Alcohol, Ethyl (B): <10 mg/dL (ref <10)

## 2020-07-26 LAB — TYPE AND SCREEN
ABO/RH(D): A POS
Antibody Screen: NEGATIVE

## 2020-07-26 LAB — FOLATE: Folate: 22.9 ng/mL (ref >5.9)

## 2020-07-26 LAB — LIPASE, BLOOD: Lipase: 20 U/L (ref 11–51)

## 2020-07-26 LAB — PROTIME-INR
INR: 1.5 — ABNORMAL HIGH (ref 0.8–1.2)
Prothrombin Time: 17.6 seconds — ABNORMAL HIGH (ref 11.4–15.2)

## 2020-07-26 LAB — TROPONIN I (HIGH SENSITIVITY)
Troponin I (High Sensitivity): 988 ng/L (ref <18)
Troponin I (High Sensitivity): 1574 ng/L (ref <18)
Troponin I (High Sensitivity): 2898 ng/L (ref <18)

## 2020-07-26 LAB — GLUCOSE, CAPILLARY
Glucose-Capillary: 92 mg/dL (ref 70–99)
Glucose-Capillary: 93 mg/dL (ref 70–99)

## 2020-07-26 LAB — ACETAMINOPHEN LEVEL: Acetaminophen (Tylenol), Serum: <10 ug/mL — ABNORMAL LOW (ref 10–30)

## 2020-07-26 LAB — POC OCCULT BLOOD, ED: Fecal Occult Bld: NEGATIVE

## 2020-07-26 LAB — BRAIN NATRIURETIC PEPTIDE: B Natriuretic Peptide: >4500 pg/mL — ABNORMAL HIGH (ref 0.0–100.0)

## 2020-07-26 LAB — SALICYLATE LEVEL: Salicylate Lvl: <7 mg/dL — ABNORMAL LOW (ref 7.0–30.0)

## 2020-07-26 LAB — PROCALCITONIN: Procalcitonin: 26.16 ng/mL

## 2020-07-26 LAB — ABO/RH: ABO/RH(D): A POS

## 2020-07-26 LAB — AMMONIA: Ammonia: 45 umol/L — ABNORMAL HIGH (ref 9–35)

## 2020-07-26 MED ORDER — NOREPINEPHRINE 4 MG/250ML-% IV SOLN
INTRAVENOUS | Status: AC
  Filled 2020-07-26: qty 250

## 2020-07-26 MED ORDER — ETOMIDATE 2 MG/ML IV SOLN
INTRAVENOUS | Status: AC
  Filled 2020-07-26: qty 20

## 2020-07-26 MED ORDER — PIPERACILLIN-TAZOBACTAM IN DEX 2-0.25 GM/50ML IV SOLN
2.2500 g | Freq: Four times a day (QID) | INTRAVENOUS | Status: DC
  Filled 2020-07-26 (×2): qty 50

## 2020-07-26 MED ORDER — INSULIN ASPART 100 UNIT/ML ~~LOC~~ SOLN
5.0000 [IU] | Freq: Once | SUBCUTANEOUS | Status: AC
  Administered 2020-07-26: 5 [IU] via INTRAVENOUS

## 2020-07-26 MED ORDER — VANCOMYCIN HCL 2000 MG/400ML IV SOLN
2000.0000 mg | Freq: Once | INTRAVENOUS | Status: AC
  Administered 2020-07-26: 2000 mg via INTRAVENOUS
  Filled 2020-07-26: qty 400

## 2020-07-26 MED ORDER — CALCIUM GLUCONATE-NACL 1-0.675 GM/50ML-% IV SOLN
1.0000 g | Freq: Once | INTRAVENOUS | Status: AC
  Administered 2020-07-26: 1000 mg via INTRAVENOUS
  Filled 2020-07-26: qty 50

## 2020-07-26 MED ORDER — IOHEXOL 350 MG/ML SOLN
100.0000 mL | Freq: Once | INTRAVENOUS | Status: AC | PRN
  Administered 2020-07-26: 100 mL via INTRAVENOUS

## 2020-07-26 MED ORDER — CHLORHEXIDINE GLUCONATE CLOTH 2 % EX PADS: 6.0000 | MEDICATED_PAD | Freq: Every day | CUTANEOUS | Status: DC

## 2020-07-26 MED ORDER — THIAMINE HCL 100 MG PO TABS: 250.0000 mg | ORAL_TABLET | Freq: Every day | ORAL | Status: DC

## 2020-07-26 MED ORDER — HEPARIN SODIUM (PORCINE) 5000 UNIT/ML IJ SOLN
5000.0000 [IU] | Freq: Three times a day (TID) | INTRAMUSCULAR | Status: DC
  Filled 2020-07-26: qty 1

## 2020-07-26 MED ORDER — IPRATROPIUM-ALBUTEROL 0.5-2.5 (3) MG/3ML IN SOLN: 3.0000 mL | Freq: Four times a day (QID) | RESPIRATORY_TRACT | Status: DC

## 2020-07-26 MED ORDER — CINACALCET HCL 30 MG PO TABS: 90.0000 mg | ORAL_TABLET | ORAL | Status: DC

## 2020-07-26 MED ORDER — ROCURONIUM BROMIDE 10 MG/ML (PF) SYRINGE
PREFILLED_SYRINGE | INTRAVENOUS | Status: AC
  Filled 2020-07-26: qty 10

## 2020-07-26 MED ORDER — MIDAZOLAM HCL 2 MG/2ML IJ SOLN
INTRAMUSCULAR | Status: AC
  Filled 2020-07-26: qty 4

## 2020-07-26 MED ORDER — PIPERACILLIN-TAZOBACTAM IN DEX 2-0.25 GM/50ML IV SOLN
2.2500 g | Freq: Three times a day (TID) | INTRAVENOUS | Status: DC
  Filled 2020-07-26 (×2): qty 50

## 2020-07-26 MED ORDER — VANCOMYCIN HCL 2000 MG/400ML IV SOLN
2000.0000 mg | Freq: Once | INTRAVENOUS | Status: DC
  Filled 2020-07-26: qty 400

## 2020-07-26 MED ORDER — LACTULOSE 10 GM/15ML PO SOLN
10.0000 g | Freq: Three times a day (TID) | ORAL | Status: DC
  Filled 2020-07-26: qty 15

## 2020-07-26 MED ORDER — LACTULOSE 10 GM/15ML PO SOLN: 10.0000 g | Freq: Three times a day (TID) | ORAL | Status: DC

## 2020-07-26 MED ORDER — FENTANYL CITRATE (PF) 100 MCG/2ML IJ SOLN
INTRAMUSCULAR | Status: AC
  Filled 2020-07-26: qty 2

## 2020-07-26 MED ORDER — SODIUM CHLORIDE 0.9 % IV BOLUS: 1000.0000 mL | Freq: Once | INTRAVENOUS | Status: DC

## 2020-07-26 MED ORDER — VANCOMYCIN HCL IN DEXTROSE 1-5 GM/200ML-% IV SOLN: 1000.0000 mg | INTRAVENOUS | Status: DC

## 2020-07-26 MED ORDER — PHENYLEPHRINE HCL-NACL 10-0.9 MG/250ML-% IV SOLN
INTRAVENOUS | Status: AC
  Filled 2020-07-26: qty 250

## 2020-07-26 MED ORDER — SODIUM THIOSULFATE 250 MG/ML IV SOLN
25.0000 g | INTRAVENOUS | Status: DC
  Administered 2020-07-26: 25 g via INTRAVENOUS
  Filled 2020-07-26: qty 100

## 2020-07-26 MED ORDER — DEXTROSE 50 % IV SOLN
1.0000 | Freq: Once | INTRAVENOUS | Status: AC
  Administered 2020-07-26: 50 mL via INTRAVENOUS
  Filled 2020-07-26: qty 50

## 2020-07-26 MED ORDER — VANCOMYCIN VARIABLE DOSE PER UNSTABLE RENAL FUNCTION (PHARMACIST DOSING): Status: DC

## 2020-07-26 MED ORDER — PROSIGHT PO TABS
1.0000 | ORAL_TABLET | Freq: Every day | ORAL | Status: DC
  Filled 2020-07-26: qty 1

## 2020-07-27 LAB — BLOOD CULTURE ID PANEL (REFLEXED) - BCID2

## 2020-07-28 MED FILL — Medication: Qty: 1 | Status: AC

## 2020-07-28 NOTE — Procedures (Signed)
   I was present at this dialysis session, have reviewed the session itself and made  appropriate changes Kelly Splinter MD [place] pager [phone]   2020/07/28, 4:45 PM

## 2020-07-28 NOTE — Consult Note (Signed)
FPTS Interim Progress Note  S: Visited patient in hemodialysis, critical care was already consulted.  Found patient to be laying in bed with HD going.  Patient was unable to answer questions, could not speak more than mumbles.  Would not answer questions or follow commands to squeeze fingers.  Currently protecting airway and hemodynamically stable.  Blood pressures low normal with SBP in the 90s and DBP in 60s and 70s.  SPO2 labile, possibly poor reading indicated by waveform.  Readjustment resulted in SPO2 100% while on 4 L nasal cannula.  O: BP 94/66   Pulse (!) 101   Temp 98.4 F (36.9 C) (Oral)   Resp (!) 22   Wt 97 kg   SpO2 95%   BMI 26.38 kg/m   General: Somnolent, confused, unable to communicate, no acute distress Respiratory: Rhonchi of anterior fields, unable to follow commands to breathe deeply Cardiac: Auscultation complicated by intermittent mumbling, S1 and S2 appreciated, no murmurs heard, regular rate and rhythm Abdomen: Soft, nondistended, no TTP Integument: Numerous dark red ulcerated lesions over fingers, hands, and forearms (previously biopsied and MRSA positive), extensive dark purple ecchymosis over lower abdomen and right upper thigh/groin (consistent with Lovenox injections and recent cardiac procedure with femoral access)   A/P: Altered mental status -Currently undergoing emergent hemodialysis, hope this will improve AMS -Provide supplemental oxygen as needed, SPO2 goal > 94% -Currently hemodynamically stable, monitor very closely -Critical care paged, plan to admit to ICU   [name], MD 08/21/20, 4:59 PM PGY-1, [place] Medicine Service pager [phone]

## 2020-07-28 NOTE — Progress Notes (Signed)
Pharmacy Antibiotic Note  Marco Arnold is a 54 y.o. male admitted on 12-Aug-2020 with intra-abdominal infection/wound infection.  Pharmacy has been consulted for Vanc and Zosyn dosing. Patient is ESRD and get HD on MWF as an outpatient. No inpatient dialysis has been scheduled at this time.  Plan: Zosyn 2.25 gm IV q6hr Vanc 2 gms IV x 1, then vanc variable dosing based on inpatient dialysis schedule Monitor clinical status, C&S and vanc levels as needed   Weight: 97 kg (213 lb 13.5 oz)  Temp (24hrs), Avg:98.4 F (36.9 C), Min:98.4 F (36.9 C), Max:98.4 F (36.9 C)  Recent Labs  Lab 08/12/20 0849 08-12-20 0901  WBC 19.1*  --   CREATININE 8.58* 8.80*  LATICACIDVEN 4.3*  --     Estimated Creatinine Clearance: 11.6 mL/min (A) (by C-G formula based on SCr of 8.8 mg/dL (H)).    Allergies  Allergen Reactions  . Lisinopril Shortness Of Breath    Unable to breath    Antimicrobials this admission: Zosyn 11/29 >> Vanc 11/29 >>   Thank you for allowing pharmacy to be a part of this patient's care.  Alanda Slim, PharmD, [facility] Clinical Pharmacist Please see AMION for all Pharmacists' Contact Phone Numbers 08/12/20, 11:43 AM

## 2020-07-28 NOTE — Progress Notes (Signed)
Pharmacy Antibiotic Note  Marco Arnold is a 54 y.o. male admitted on 2020-07-27 with intra-abdominal infection/wound infection.  Pharmacy has been consulted for Vanc and Zosyn dosing. Patient is ESRD and get HD on MWF as an outpatient., continuing inpatient.  Vanc and pip/tazp not given before HD, rescheduled for after and called HD RN to give after HD.    Plan: Zosyn 2.25 gm IV q8hr Vanc 2 gms IV x 1, then 1g Post HD MWF Monitor cultures, clinical status, renal fx, vanc levels Narrow abx as able and f/u duration     Weight: (P) 97 kg (213 lb 13.5 oz)  Temp (24hrs), Avg:98.4 F (36.9 C), Min:98.4 F (36.9 C), Max:98.4 F (36.9 C)  Recent Labs  Lab Jul 27, 2020 0849 07-27-20 0901 07-27-20 1143  WBC 19.1*  --   --   CREATININE 8.58* 8.80*  --   LATICACIDVEN 4.3*  --  6.2*    Estimated Creatinine Clearance: 11.6 mL/min (A) (by C-G formula based on SCr of 8.8 mg/dL (H)).    Allergies  Allergen Reactions  . Ace Inhibitors Cough and Shortness Of Breath    Other reaction(s): Breathing Problems Unable to breath Unable to breath  . Lisinopril Shortness Of Breath    Unable to breath    Antimicrobials this admission: Zosyn 11/29 >> Vanc 11/29 >>   Microbiology 11/29 BCx pending   Thank you for allowing pharmacy to be a part of this patient's care.  Benetta Spar, PharmD, BCPS, BCCP Clinical Pharmacist  Please check AMION for all [place] phone numbers After 10:00 PM, call [place] [phone]

## 2020-07-28 NOTE — ED Notes (Signed)
Help get patient undressed into a gown on the monitor patient is resting with call bell in reach  

## 2020-07-28 NOTE — Consult Note (Addendum)
Cardiology Consultation:   Patient ID: Marco Arnold MRN: [id number]; DOB: 1966/07/12  Admit date: 2020/08/22 Date of Consult: 08-22-2020  Primary Care Provider: Cyndi Bender, [facility] Cardiologist: Winfred Burn, MD [place]io [place], [place])  Patient Profile:   Marco Arnold is a 54 y.o. male with a hx of CAD with PCI of the LAD in 06/2019 at [facility], Chronic systolic CHF, ICM, moderate to severe AS, ESRD on HD, HTN, thrombocytopenia who is being seen today for the evaluation of Elevated troponin at the request of Dr. Thompson Grayer.   Admitted 10/31-11/6 for demand ischemia due to volume overload than actual ACS.  LHC showed a tight eccentric/calcified 80% distal left main stenosis. Echo with AoV mean gradient 15 mmHg but calculated AVA 0.7 cm^2. No significant pullback gradient across the valve on cath. TEE showed moderate-severe aortic stenosis (may be more moderate based on ease of crossing at time of cath), moderate mitral regurgitation, moderate tricuspid regurgitation, small PFO. Evaluated by CT surgery and felt to be too high risk for Seen by Palliative care to discuss [place]. Seen by Dr. Burt Knack and felt high risk for TVAR. Seen by Palliative care to discuss [place].   Went for 2nd opinion at [facility] @ [place] for high risk PCI due to ongoing chest pain and directly admitted 07/06/20-07/17/20. Underwent LHC 07/08/20 without intervention>> medical managment. Dobutamine stress testing showed severe AS. TVAR scan showed incidental finding of colonic diverticulosis complicated by fistula formation and central mesenteric abscess. Structural heart team did not felt candidate for TVAR/SAVR. Seen by GI and general surgery. Patient underwent Successful balloon aortic valvuloplasty using 24 mm Atlas Gold balloon. Doppler after procedure indicated flow in the proximal SFA on 11/19. Limited echo with LVEF of 20% and moderate TR. Placed on Bidil/Coreg by HF team.   History of Present Illness:   [name]  brought to ER by EMS for AMS, generalized weakness and confusion.  Last known normal 7 PM yesterday when daughter left.  He was found confused when his father tried to woke him up.  Per note he may have "pain pills".  Seen by neurology for confusion and questionable aphasia.  Stroke work-up initiated with pending MRI of brain.  CT angio of head without acute abnormality.  Patient seen at dialysis.  History obtained by reviewing chart.  Chest x-ray without acute disease. Potassium 6.8>> 6.7 BNP greater than 4500 Creatinine 8.5>> 8.8 Albumin 2.1 Hs-Troponin 988>>1574 Lactic acid 4.3>>6.2 Hgb 9.9>>10.2  Cardiology is asked for elevated troponin.    Past Medical History:  Diagnosis Date  . Arthritis    spine  . Chronic kidney disease    [place] MWF  . Coronary artery disease   . Dialysis patient [facility]) 2013  . Diverticulitis   . Hyperlipidemia   . Hypertension    dr  Tobie Lords      @ [facility]  . Neuromuscular disorder ([place])    nerve pain - tx w/cymbalta  . Neuropathic pain 09/20/2017   Left mid-thoracic    Past Surgical History:  Procedure Laterality Date  . AV FISTULA PLACEMENT  11/10/2011   Procedure: ARTERIOVENOUS (AV) FISTULA CREATION;  Surgeon: Mal Misty, MD;  Location: [place];  Service: Vascular;  Laterality: Left;  Ultrasound guided  . CARDIAC CATHETERIZATION  09/19/2018   On stent study at [facility]  . COLONOSCOPY    . INSERTION OF DIALYSIS CATHETER  02/09/2012   Procedure: INSERTION OF DIALYSIS CATHETER;  Surgeon: Mal Misty, MD;  [facility];  Service: Vascular;  Laterality: N/A;  . MASS EXCISION Right 09/07/2018   Procedure: EXCISION MASS RIGHT THUMB;  Surgeon: Charlotte Crumb, MD;  Location: [place];  Service: Orthopedics;  Laterality: Right;  OR AXILLARY BLOCK  . REVISON OF ARTERIOVENOUS FISTULA Left 04/08/2019   Procedure: REVISION PLICATION OF LEFT ARM ARTERIOVENOUS FISTULA;  Surgeon: Serafina Mitchell, MD;  Location: [place];  Service: Vascular;   Laterality: Left;  . RIGHT/LEFT HEART CATH AND CORONARY ANGIOGRAPHY N/A 06/28/2020   Procedure: RIGHT/LEFT HEART CATH AND CORONARY ANGIOGRAPHY;  Surgeon: Lorretta Harp, MD;  Location: [place] CV LAB;  Service: Cardiovascular;  Laterality: N/A;  . SHUNTOGRAM Left 06/03/2012   Procedure: Earney Mallet;  Surgeon: Conrad Liberty, MD;  Location: [facility] CATH LAB;  Service: Cardiovascular;  Laterality: Left;  . SHUNTOGRAM Left 01/02/2013   Procedure: SHUNTOGRAM;  Surgeon: Conrad Tuscumbia, MD;  Location: [facility] CATH LAB;  Service: Cardiovascular;  Laterality: Left;  . TEE WITHOUT CARDIOVERSION N/A 07/01/2020   Procedure: TRANSESOPHAGEAL ECHOCARDIOGRAM (TEE);  Surgeon: Larey Dresser, MD;  Location: [facility] ENDOSCOPY;  Service: Cardiovascular;  Laterality: N/A;    Inpatient Medications: Scheduled Meds: . Chlorhexidine Gluconate Cloth  6 each Topical Q0600  . cinacalcet  90 mg Oral Q M,W,F-HD  . heparin  5,000 Units Subcutaneous Q8H  . vancomycin variable dose per unstable renal function (pharmacist dosing)   Does not apply See admin instructions   Continuous Infusions: . piperacillin-tazobactam (ZOSYN)  IV    . sodium chloride    . sodium thiosulfate infusion for calciphylaxis    . vancomycin     PRN Meds:   Allergies:    Allergies  Allergen Reactions  . Ace Inhibitors Cough and Shortness Of Breath    Other reaction(s): Breathing Problems Unable to breath Unable to breath  . Lisinopril Shortness Of Breath    Unable to breath    Social History:   Social History   Socioeconomic History  . Marital status: Divorced    Spouse name: Not on file  . Number of children: 2  . Years of education: 74  . Highest education level: Not on file  Occupational History  . Occupation: disabled  Tobacco Use  . Smoking status: Light Tobacco Smoker    Packs/day: 0.25    Years: 43.00    Pack years: 10.75    Types: Cigarettes  . Smokeless tobacco: Never Used  . Tobacco comment: pt states that he is trying to  quit--is trying to cut back at the current time  Vaping Use  . Vaping Use: Former  . Quit date: 08/28/2018  Substance and Sexual Activity  . Alcohol use: Yes    Comment: occasional  . Drug use: Yes    Types: Marijuana  . Sexual activity: Not on file  Other Topics Concern  . Not on file  Social History Narrative   Lives with father   Caffeine use: none   Right handed    Social Determinants of Health   Financial Resource Strain:   . Difficulty of Paying Living Expenses: Not on file  Food Insecurity:   . Worried About Charity fundraiser in the Last Year: Not on file  . Ran Out of Food in the Last Year: Not on file  Transportation Needs:   . Lack of Transportation (Medical): Not on file  . Lack of Transportation (Non-Medical): Not on file  Physical Activity:   . Days of Exercise per Week: Not on file  .  Minutes of Exercise per Session: Not on file  Stress:   . Feeling of Stress : Not on file  Social Connections:   . Frequency of Communication with Friends and Family: Not on file  . Frequency of Social Gatherings with Friends and Family: Not on file  . Attends Religious Services: Not on file  . Active Member of Clubs or Organizations: Not on file  . Attends Archivist Meetings: Not on file  . Marital Status: Not on file  Intimate Partner Violence:   . Fear of Current or Ex-Partner: Not on file  . Emotionally Abused: Not on file  . Physically Abused: Not on file  . Sexually Abused: Not on file    Family History:   Family History  Problem Relation Age of Onset  . Pancreatic cancer Mother   . Diabetes Father   . Hypertension Father   . Cancer - Lung Paternal Grandfather      ROS:  Please see the history of present illness.  All other ROS reviewed and negative.     Physical Exam/Data:   Vitals:   Jul 31, 2020 1340 2020-07-31 1348 July 31, 2020 1415 07-31-2020 1430  BP: (P) 98/74 103/82 108/74 115/76  Pulse: (P) 84 84 94 99  Resp: (!) (P) 22 (!) 22 (!) 21 (!) 23    Temp: (P) 98.4 F (36.9 C)     TempSrc: (P) Oral     SpO2: (P) 96% (P) 95% (!) 85%   Weight: (P) 97 kg      No intake or output data in the 24 hours ending 2020-07-31 1505 Last 3 Weights 07/31/2020 July 31, 2020 07/03/2020  Weight (lbs) 213 lb 13.5 oz 213 lb 13.5 oz 219 lb 9.3 oz  Weight (kg) 97 kg 97 kg 99.6 kg     Body mass index is 26.38 kg/m (pended).  General:  Ill appearing male in no acute distress HEENT: normal Lymph: no adenopathy Neck: no JVD Endocrine:  No thryomegaly Vascular: No carotid bruits; FA pulses 2+ bilaterally without bruits  Cardiac:  normal S1, S2; RRR; positive murmur  Lungs:  clear to auscultation bilaterally, no wheezing, rhonchi or rales  Abd: soft, nontender, no hepatomegaly  Ext: trace edema Musculoskeletal:  No deformities, BUE and BLE strength normal and equal Skin: warm and dry  Neuro:  Lethargic  Psych: lethergic  EKG:  The EKG was personally reviewed and demonstrates:  Sinus rhythm, early repol  Telemetry:  Telemetry was personally reviewed and demonstrates:  Sinus rhythm   Relevant CV Studies:  [facility] 07/08/20 @ [facility] LV angiography: Severe global hypokinesis; EF~30%; mild MR,no obvious  Aortic dissection   Left main: 40% ostial LM   Left circumflex: dominant; large OM1, small OM2, Large OM3, large OM4,  large OM5   LAD: stent in mid LAD widely patent that jails a normal size D1, small D2,  small D3, LAD wraps the apex   Right coronary artery: Nondominant   Intervention:  Then placed a JL4 guide in the LM and anticoagulated with heparin  achieving an ACT>200. Then crossed the LM with a prowater wire. Then  performed IVUS of the ostial LM showing 3.4x4.73mm Vessel and then IFR  showing 0.96.   Assessment/Plan:  1. Nonobstructive CAD with a 40% ostial LM; Medical management   2. Severe global hypokinesis; EF~30%   3. Normal saline 4 hours   4. Follow-up in approximately 4-5 weeks.    CV/cath 07/16/20 @ [facility]  PROCEDURES:  1.  Ultrasound guided vascular access  2. Placement of a temporary venous pacemaker  3. Percutaneous balloon aortic valvuloplasty  4. Successful deployment of two Perclose devices   ACCESS: Right femoral artery, right femoral vein  ANTICOAGULATION: IV heparin.  HEMOSTASIS: Perclose to the artery and manual pressure to the vein.   ANESTHESIA: Prior to the administration of sedation, the patient was  evaluated with pre procedure documentation completed - refer to Immediate  Pre Procedure Airway & Anesthesia/Pre-Sedation Reassessment Form.  Local  and Moderate Sedation with Fentanyl 50 mcg and Versed 2 mg IV was used  for this procedure. I personally spent 63minutes continuously monitoring  the patient during the administration of the sedation for this procedure.   The procedural nurse was present for the duration of the procedure to  assist in monitoring the patient. Patient assessed post  sedation/anesthesia and tolerated well. Vital signs stable and no  complications noted.    INFORMED CONSENT:  Prior to arriving in the catheterization lab, the patient was counseled  regarding the risks, benefits, and alternatives of the procedure and  agreed to proceed.   PROCEDURAL DETAILS:  Both groins were prepped and draped in the usual fashion.  US guidance  was used for vascular access to identify the potential access site, and to  evaluate for patency of the vessel. Real-time visualization of vascular  needle entry was obtained usingultrasound, and images were recorded.   Using a 37F micropuncture needle and sheath, a 69F long sheath was place in  the right femoral vein and a 69F sheath in the right femoral artery.   A 5 French temporary pacing wire was placed in the right ventricle.  I needed a Glide advantage wire to traverse the iliac stenosis. We  performed dual Perclose on the artery. We needed to place an 8 [place]  sheath and then use a 5 [place] AL-1 to then place Amplatz  exorcist wire in  the aorta to then place a 10 [place] sheath. The wire actually crossed the  aortic valve. We placed the pigtail catheter. We performed simultaneous  gradients. We paced the patient had 110 bpm to avoid hypotension due to  his severe LV dysfunction. We dilated the valve with a24 mm Atlas Gold  balloon. We repeated hemodynamics and the peak to peak gradient went from  25 to 20 mmHg. We therefore paced at 120 bpm and redilated with a 24 mm  balloon. There was movement and piston-like motion of the balloon because  it was undersized, the annulus measured 27 mm, as well as still residual  blood pressure because I did not want to fully rapid pacing. We replaced  the pigtail catheter and the peak to peak gradient was 14 mmHg. We  decided to stop. I gave 30 mg of protamine.   The 38F femoral artery sheath was removed and hemostasis achieved using  the dual, perpendicular Perclose ProGlide closure technique. Manual  compression hemostasis was applied to the right femoral venous sheath. The  patient tolerated the procedure well and without complication.   HEMODYNAMIC FINDINGS:  1. Pre-valvuloplasty, the left ventricular pressure was 149/20-30 and the  aortic pressure was 124/70.  The peak to peak gradient prior to the  valvuloplasty was 25 mm Hg, mean 97mm Hg.  2. Post valvuloplasty, the left ventricular pressure was 155/20-30 and the  aortic pressure was 141/74.  The peak to peak gradient post valvuloplasty  was 14 mm Hg and the mean gradient was 26 mm Hg  Impression Performed by [facility] 1. Successful balloon  aortic valvuloplasty using 24 mm Atlas Gold  balloon. Doppler after procedure indicated flow in the proximal SFA.   RECOMMENDATIONS:  1. Overnight observation and post operative transthoracic echo.    TEE 07/01/2020 IMPRESSIONS    1. Left ventricular ejection fraction, by estimation, is 20 to 25%. The  left ventricle has severely decreased  function. The left ventricle  demonstrates global hypokinesis. The left ventricular internal cavity size  was mildly dilated. There is mild left  ventricular hypertrophy.  2. Right ventricular systolic function is moderately reduced. The right  ventricular size is mildly enlarged.  3. Left atrial size was moderately dilated. No left atrial/left atrial  appendage thrombus was detected.  4. Right atrial size was moderately dilated.  5. The mitral valve is normal in structure. Moderate mitral valve  regurgitation. No evidence of mitral stenosis.  6. Peak RV-RA gradient 29 mmHg. Tricuspid valve regurgitation is  moderate.  7. There was moderate to severe aortic stenosis with mean gradient 24  mmHg, AVA 0.84 cm^2 by continuity equation and 1.2 cm^2 by planimetry. The  aortic valve is tricuspid. Aortic valve regurgitation is not visualized.  8. Small PFO noted by color doppler.  9. Normal caliber thoracic aorta with mild plaque.   Echo 06/27/2020 1. Left ventricular ejection fraction, by estimation, is 25 to 30%. The  left ventricle has severely decreased function. The left ventricle  demonstrates global hypokinesis. The left ventricular internal cavity size  was mildly dilated. Left ventricular  diastolic parameters are indeterminate.  2. Right ventricular systolic function is moderately reduced. The right  ventricular size is moderately enlarged. There is mildly elevated  pulmonary artery systolic pressure. The estimated right ventricular  systolic pressure is 62.6 mmHg.  3. Left atrial size was moderately dilated.  4. Right atrial size was moderately dilated.  5. The mitral valve is degenerative. Moderate mitral valve regurgitation.  No evidence of mitral stenosis.  6. Tricuspid valve regurgitation is severe.  7. Low flow, low gradient aortic stenosis. DI is 0.20. The aortic valve  is tricuspid. There is severe calcifcation of the aortic valve. There is  severe  thickening of the aortic valve. Aortic valve regurgitation is not  visualized. Moderate to severe  aortic valve stenosis. Aortic valve area, by VTI measures 0.73 cm. Aortic  valve mean gradient measures 15.0 mmHg. Aortic valve Vmax measures 2.89  m/s.  8. The inferior vena cava is dilated in size with >50% respiratory  variability, suggesting right atrial pressure of 8 mmHg.   Cath 06/28/2020 IMPRESSION: Mr. Ellis has patent LAD stents although there does appear to be in-stent restenosis within in the proximal/ostial portion.  He has a left dominant system.  He does have an 80% eccentric distal left main stenosis seen in multiple views which appears hemodynamically significant.  I did cross the aortic valve demonstrated no pullback gradient.  Based on his anatomy, he will require CABG plus or minus aortic valve replacement depending on the transesophageal echo performed at the time of bypass grafting.  Mynx closure devices were successfully deployed in both the right common femoral artery and vein.  The patient left lab in stable condition.  Dr. Gardiner Rhyme was notified of these results.  Diagnostic Dominance: Left    Laboratory Data:  High Sensitivity Troponin:   Recent Labs  Lab 06/27/20 1424 06/27/20 1937 06/27/20 2123 08/10/20 0849 08-10-2020 1143  TROPONINIHS 279* 263* 302* 988* 1,574*     Chemistry Recent Labs  Lab 08-10-2020 0849 August 10, 2020 0901  NA  134* 130*  K 6.8* 6.7*  CL 86* 93*  CO2 25  --   GLUCOSE 133* 130*  BUN 78* 81*  CREATININE 8.58* 8.80*  CALCIUM 9.9  --   GFRNONAA 7*  --   ANIONGAP 23*  --     Recent Labs  Lab 2020-08-07 0849  PROT 6.7  ALBUMIN 2.1*  AST 79*  ALT 49*  ALKPHOS 236*  BILITOT 1.5*   Hematology Recent Labs  Lab Aug 07, 2020 0849 08-07-20 0901  WBC 19.1*  --   RBC 3.67*  --   HGB 9.9* 10.2*  HCT 31.4* 30.0*  MCV 85.6  --   MCH 27.0  --   MCHC 31.5  --   RDW 17.3*  --   PLT 107*  --    BNP Recent Labs  Lab 08/07/20 0849  BNP  >4,500.0*    DDimer No results for input(s): DDIMER in the last 168 hours.   Radiology/Studies:  CT Angio Head W or Wo Contrast  Result Date: 2020-08-07 CLINICAL DATA:  Vertigo, central. Additional provided: Patient reports vertigo and abdominal pain. EXAM: CT ANGIOGRAPHY HEAD AND NECK TECHNIQUE: Multidetector CT imaging of the head and neck was performed using the standard protocol during bolus administration of intravenous contrast. Multiplanar CT image reconstructions and MIPs were obtained to evaluate the vascular anatomy. Carotid stenosis measurements (when applicable) are obtained utilizing NASCET criteria, using the distal internal carotid diameter as the denominator. CONTRAST:  189mL OMNIPAQUE IOHEXOL 350 MG/ML SOLN COMPARISON:  Brain MRI 02/18/2015. Report from cervical spine MRI 09/21/2015 (images unavailable). FINDINGS: CT HEAD FINDINGS Brain: Mild cerebral and cerebellar atrophy, advanced for age. Minimal patchy hypoattenuation within the cerebral white matter is nonspecific, but most often secondary to chronic small vessel ischemia. There is no acute intracranial hemorrhage. No demarcated cortical infarct. No extra-axial fluid collection. No evidence of intracranial mass. No midline shift. Vascular: Atherosclerotic calcifications. This includes a calcific focus within the left sylvian fissure which may reflect calcified atherosclerotic plaque or an age-indeterminate calcified embolus. Skull: Normal. Negative for fracture or focal lesion. Sinuses: Mild scattered paranasal sinus mucosal thickening. Orbits: No mass or acute finding. Review of the MIP images confirms the above findings CTA NECK FINDINGS Aortic arch: Standard aortic branching. Atherosclerotic plaque within the visualized aortic arch and proximal major branch vessels of the neck. No hemodynamically significant innominate or proximal subclavian artery stenosis. Right carotid system: CCA and ICA patent within the neck without  significant stenosis (50% or greater). Moderate calcified plaque within the carotid bifurcation and proximal ICA. Left carotid system: CCA and ICA patent within the neck without significant stenosis (50% or greater). Moderate calcified plaque within the mid CCA. Moderate soft and calcified plaque within the distal CCA, carotid bifurcation and proximal ICA. Vertebral arteries: Patent within the neck. The right vertebral artery is dominant. Calcified plaque results in moderate to moderately severe stenosis of the proximal right V1 segment. Nonstenotic calcified plaque at the origin of the left vertebral artery. Skeleton: Cervical spondylosis. Nonspecific 8 mm lucent lesion within the C4 vertebral body. Other neck: No neck mass or cervical lymphadenopathy. Upper chest: No consolidation within the imaged lung apices. Review of the MIP images confirms the above findings CTA HEAD FINDINGS Anterior circulation: The intracranial internal carotid arteries are patent. Calcified plaque within both vessels. Resultant moderate stenosis of the paraclinoid ICAs bilaterally. The M1 middle cerebral arteries are patent. No M2 proximal branch occlusion is identified. There is a calcified focus within a mid M2 left MCA  branch vessel, which may reflect calcified atherosclerotic plaque or an age-indeterminate calcified embolus (series 18, image 28). Finding was discussed by telephone at the time of interpretation on 02-Aug-2020 at 9:45 am with provider Dr. Gregor Hams, who verbally acknowledged these results. Atherosclerotic irregularity of the M2 and more distal MCA branch vessels bilaterally. Most notably, there is a moderate/severe focal stenosis within a mid M2 left MCA branch (series 16, image 22). The anterior cerebral arteries are patent. No intracranial aneurysm is identified. Posterior circulation: The intracranial vertebral arteries are patent. Up to moderate stenosis within the V4 right vertebral artery. There is also moderate  stenosis within the V4 left vertebral artery at the vertebrobasilar junction. The basilar artery is patent. The posterior cerebral arteries are patent. Posterior communicating arteries are hypoplastic or absent bilaterally. Venous sinuses: Within the limitations of contrast timing, no convincing thrombus. Anatomic variants: As described. Review of the MIP images confirms the above findings IMPRESSION: CT head: 1. No evidence of acute intracranial abnormality. 2. Mild cerebral and cerebellar atrophy, advanced for age. 3. Minimal patchy cerebral white matter disease, nonspecific but most often secondary to chronic small vessel ischemia. CTA neck: 1. The bilateral common and internal carotid arteries are patent within the neck without hemodynamically significant stenosis. Moderate plaque within the bilateral carotid systems as described. 2. The vertebral arteries are patent within the neck bilaterally. Calcified plaque results in moderate to moderately severe stenosis of the proximal right V1 segment. CTA head: 1. No intracranial large vessel occlusion. 2. Focus of calcification within a mid M2 left MCA branch vessel which may reflect an age-indeterminate calcified embolus or calcified atherosclerotic plaque. 3. Multifocal intracranial atherosclerotic stenoses, most notably as follows. 4. Moderate stenosis of the paraclinoid ICAs bilaterally. 5. Moderate/severe focal stenosis within a mid M2 left MCA vessel. 6. Moderate stenoses within the V4 vertebral arteries bilaterally. Electronically Signed   By: Kellie Simmering DO   On: 08/02/20 09:59   CT Angio Neck W and/or Wo Contrast  Result Date: 08/02/20 CLINICAL DATA:  Vertigo, central. Additional provided: Patient reports vertigo and abdominal pain. EXAM: CT ANGIOGRAPHY HEAD AND NECK TECHNIQUE: Multidetector CT imaging of the head and neck was performed using the standard protocol during bolus administration of intravenous contrast. Multiplanar CT image  reconstructions and MIPs were obtained to evaluate the vascular anatomy. Carotid stenosis measurements (when applicable) are obtained utilizing NASCET criteria, using the distal internal carotid diameter as the denominator. CONTRAST:  139mL OMNIPAQUE IOHEXOL 350 MG/ML SOLN COMPARISON:  Brain MRI 02/18/2015. Report from cervical spine MRI 09/21/2015 (images unavailable). FINDINGS: CT HEAD FINDINGS Brain: Mild cerebral and cerebellar atrophy, advanced for age. Minimal patchy hypoattenuation within the cerebral white matter is nonspecific, but most often secondary to chronic small vessel ischemia. There is no acute intracranial hemorrhage. No demarcated cortical infarct. No extra-axial fluid collection. No evidence of intracranial mass. No midline shift. Vascular: Atherosclerotic calcifications. This includes a calcific focus within the left sylvian fissure which may reflect calcified atherosclerotic plaque or an age-indeterminate calcified embolus. Skull: Normal. Negative for fracture or focal lesion. Sinuses: Mild scattered paranasal sinus mucosal thickening. Orbits: No mass or acute finding. Review of the MIP images confirms the above findings CTA NECK FINDINGS Aortic arch: Standard aortic branching. Atherosclerotic plaque within the visualized aortic arch and proximal major branch vessels of the neck. No hemodynamically significant innominate or proximal subclavian artery stenosis. Right carotid system: CCA and ICA patent within the neck without significant stenosis (50% or greater). Moderate calcified plaque within the carotid  bifurcation and proximal ICA. Left carotid system: CCA and ICA patent within the neck without significant stenosis (50% or greater). Moderate calcified plaque within the mid CCA. Moderate soft and calcified plaque within the distal CCA, carotid bifurcation and proximal ICA. Vertebral arteries: Patent within the neck. The right vertebral artery is dominant. Calcified plaque results in  moderate to moderately severe stenosis of the proximal right V1 segment. Nonstenotic calcified plaque at the origin of the left vertebral artery. Skeleton: Cervical spondylosis. Nonspecific 8 mm lucent lesion within the C4 vertebral body. Other neck: No neck mass or cervical lymphadenopathy. Upper chest: No consolidation within the imaged lung apices. Review of the MIP images confirms the above findings CTA HEAD FINDINGS Anterior circulation: The intracranial internal carotid arteries are patent. Calcified plaque within both vessels. Resultant moderate stenosis of the paraclinoid ICAs bilaterally. The M1 middle cerebral arteries are patent. No M2 proximal branch occlusion is identified. There is a calcified focus within a mid M2 left MCA branch vessel, which may reflect calcified atherosclerotic plaque or an age-indeterminate calcified embolus (series 18, image 28). Finding was discussed by telephone at the time of interpretation on Aug 01, 2020 at 9:45 am with provider Dr. Gregor Hams, who verbally acknowledged these results. Atherosclerotic irregularity of the M2 and more distal MCA branch vessels bilaterally. Most notably, there is a moderate/severe focal stenosis within a mid M2 left MCA branch (series 16, image 22). The anterior cerebral arteries are patent. No intracranial aneurysm is identified. Posterior circulation: The intracranial vertebral arteries are patent. Up to moderate stenosis within the V4 right vertebral artery. There is also moderate stenosis within the V4 left vertebral artery at the vertebrobasilar junction. The basilar artery is patent. The posterior cerebral arteries are patent. Posterior communicating arteries are hypoplastic or absent bilaterally. Venous sinuses: Within the limitations of contrast timing, no convincing thrombus. Anatomic variants: As described. Review of the MIP images confirms the above findings IMPRESSION: CT head: 1. No evidence of acute intracranial abnormality. 2. Mild  cerebral and cerebellar atrophy, advanced for age. 3. Minimal patchy cerebral white matter disease, nonspecific but most often secondary to chronic small vessel ischemia. CTA neck: 1. The bilateral common and internal carotid arteries are patent within the neck without hemodynamically significant stenosis. Moderate plaque within the bilateral carotid systems as described. 2. The vertebral arteries are patent within the neck bilaterally. Calcified plaque results in moderate to moderately severe stenosis of the proximal right V1 segment. CTA head: 1. No intracranial large vessel occlusion. 2. Focus of calcification within a mid M2 left MCA branch vessel which may reflect an age-indeterminate calcified embolus or calcified atherosclerotic plaque. 3. Multifocal intracranial atherosclerotic stenoses, most notably as follows. 4. Moderate stenosis of the paraclinoid ICAs bilaterally. 5. Moderate/severe focal stenosis within a mid M2 left MCA vessel. 6. Moderate stenoses within the V4 vertebral arteries bilaterally. Electronically Signed   By: Kellie Simmering DO   On: 08-01-20 09:59   CT ABDOMEN PELVIS W CONTRAST  Addendum Date: 08/01/2020   ADDENDUM REPORT: 08/01/2020 10:01 ADDENDUM: No significant lymph node enlargement in the abdomen or pelvis. Small lymph nodes at the gastrohepatic ligament are similar to the exam on 03/23/2020. Electronically Signed   By: Markus Daft M.D.   On: 01-Aug-2020 10:01   Result Date: 08-01-20 CLINICAL DATA:  54 year old dialysis patient with abdominal pain and vertigo. EXAM: CT ABDOMEN AND PELVIS WITH CONTRAST TECHNIQUE: Multidetector CT imaging of the abdomen and pelvis was performed using the standard protocol following bolus administration of intravenous contrast.  CONTRAST:  185mL OMNIPAQUE IOHEXOL 350 MG/ML SOLN COMPARISON:  CT abdomen and pelvis 03/23/2020 and chest CT 06/30/2020 FINDINGS: Lower chest: New irregular pleural-based densities at the lung bases likely associated with  atelectasis based interval development since 06/30/2020. No large pleural effusions. Heart is enlarged and stable. Again noted is a small amount of pericardial fluid. Hepatobiliary: There may be mild gallbladder wall thickening without significant distention. Small amount of perihepatic ascites is new. No discrete liver lesion. No biliary dilatation. Main portal venous system is patent. Pancreas: Unremarkable. No pancreatic ductal dilatation or surrounding inflammatory changes. Spleen: Small amount of ascites around the spleen. Spleen is normal for size. Adrenals/Urinary Tract: Stable appearance of the adrenal glands. Chronic atrophy of both kidneys with small bilateral renal cysts. No hydronephrosis. Extensive high-density material in the urinary bladder is new from the prior pelvic CT. No bladder distension. Stomach/Bowel: Rectum is distended with oral contrast. Normal appearance of the stomach. There is no evidence for bowel obstruction. High-density contrast slightly limits evaluation of the colon. Difficult to exclude mild pericolonic edema involving the transverse colon. There is also concern for focal wall thickening and mild pericolonic edema in the sigmoid colon, particularly on sequence 5, image 81. Vascular/Lymphatic: Distal abdominal aorta is heavily calcified. Concern for bilateral common iliac artery stenosis due to extensive calcified plaque. Large calcified plaque in the right common femoral artery which is likely causing hemodynamically significant stenosis. Visceral arteries are heavily calcified. Reproductive: High-density material involving the seminal vesicles may be related to the calcifications. Prostate is normal for size. Other: Small amount of ascites in the right lower quadrant. Small amount of ascites around the liver and spleen. Small amount of fat ascites in the anterior pelvis. Negative for free air. Musculoskeletal: No acute abnormality. Degenerative facet disease in lumbar spine.  Diffuse subcutaneous edema. IMPRESSION: 1. Small amount of ascites in the abdomen and pelvis. This is new since July 2021. There may be mild gallbladder wall thickening which could be related to the ascites. If this is an area of clinical concern, recommend right upper quadrant ultrasound to evaluate the gallbladder. 2. Concern for areas of wall thickening involving the sigmoid colon and possibly the transverse colon. These areas are difficult to evaluate due to the high density contrast in the colon. Areas of colitis cannot be excluded. 3. New high-density material within the urinary bladder. This is new since 03/23/2020. This high-density material appears to be in the urinary bladder wall based on the sagittal reformats and suggestive for dystrophic wall calcifications. Findings may be associated with the end-stage renal disease and recommend correlation with urinalysis. 4. Chronic cardiomegaly. 5. Chronic renal atrophy with small renal cysts. Findings are compatible with end-stage renal disease. 6. Severe atherosclerotic disease involving the distal abdominal aorta and common iliac arteries. Suspect hemodynamically significant stenosis involving bilateral common iliac arteries and the right common femoral artery. Electronically Signed: By: Markus Daft M.D. On: 07-31-2020 09:50   DG Chest Portable 1 View  Result Date: 31-Jul-2020 CLINICAL DATA:  Altered mental status. EXAM: PORTABLE CHEST 1 VIEW COMPARISON:  June 27, 2020. FINDINGS: Stable cardiomegaly. Both lungs are clear. The visualized skeletal structures are unremarkable. IMPRESSION: No active disease. Electronically Signed   By: Marijo Conception M.D.   On: 2020-07-31 10:08     Assessment and Plan:   1. Elevated troponin in setting of sepsis of unknown source and underlying CAD - Recent cath showed patent LAD stent but high grade LM stenosis. Felt high risk for PCI  and surgery here at [facility] and [facility]. Medical management recommended.  - His elevated  troponin could be demand vs due to underlying CAD however patient is not a candidate for invasive intervention  - Continue medical therapy with ASA, coreg and statin  - Recommended conservative management and reassess goals of care  2. Aortic stenosis -Not a candidate for TVAR/SAVR - underwent successful balloon aortic valvuloplasty using 24 mm Atlas Gold balloon on 07/16/20 at outside hospital   3. ESRD on HD - Currently undergoing dialysis   4. AMS - Per primary team   5. Chronic systolic CHF - Echo 82/99 with LVEF of  25 to 30% - BP soft (hold home Coreg and Bidil)   [id number]}  0746} TIMI Risk Score for Unstable Angina or Non-ST Elevation MI:   The patient's TIMI risk score is 5, which indicates a 26% risk of all cause mortality, new or recurrent myocardial infarction or need for urgent revascularization in the next 14 days.   For questions or updates, please contact [place] Please consult [email or web address] for contact info under    Jarrett Soho, PA  Aug 19, 2020 3:05 PM   I have examined the patient and reviewed assessment and plan and discussed with patient.  Agree with above as stated.  He has had a thorough cardiac eval here and in [place].  He was treated with valvuloplasty in [facility] for severe AS.  Today, he is confused.  He is unable to answer questions during his dialysis session.  He has a diffuse rash with scabs on all of his extremities.   Not a candidate for invasive testing.  Continue medical therapy and symptom control.  Prior palliative care consult was also reviewed.  I think this is appropriate.   Larae Grooms

## 2020-07-28 NOTE — Procedures (Addendum)
Intubation Procedure Note  Marco Arnold  [id number]  [birth year]  Date:Aug 20, 2020  Time:9:16 PM   Provider Performing:Tamyah Cutbirth    Procedure: Intubation (26333)  Indication(s) Respiratory Failure  Consent Unable to obtain consent due to emergent nature of procedure.   Anesthesia Etomidate and Rocuronium   Time Out Verified patient identification, verified procedure, site/side was marked, verified correct patient position, special equipment/implants available, medications/allergies/relevant history reviewed, required imaging and test results available.   Sterile Technique Usual hand hygeine, masks, and gloves were used   Procedure Description Patient positioned in bed supine.  Sedation given as noted above.  Patient was intubated with endotracheal tube using Glidescope.  View was Grade 1 full glottis .  Number of attempts was 1.  Colorimetric CO2 detector was consistent with tracheal placement.   Complications/Tolerance Patient coded about 5 to 10 minutes after intubation    EBL Minimal   Specimen(s) None

## 2020-07-28 NOTE — Consult Note (Addendum)
[facility] Renal Consultation Note    Indication for Consultation:  Management of ESRD/hemodialysis; anemia, hypertension/volume and secondary hyperparathyroidism PCP: Cyndi Bender [facility] Nephrologist: Dr. Hollie Salk  HPI: Marco Arnold is a 54 y.o. male with ESRD on hemodialysis MWF at [facility]. PMH: HTN, CAD with L Main stenosis, HFrEF, recent admission [facility] in [place]11/9 - 07/17/20 for CAD/AS S/P balloon aortic valvulloplasty, found to have incidental mesenteric abscess, skin lesions-being treated as calciphylaxis, SHPT, Anemia of Chronic disease, tobacco abuse. Last HD 07/24/2020-ran extra treatment for SOB. He has not missed HD treatments, has truncated several HD treatments. He left last HD treatment 07/24/2020 1.6 kg above OP EDW.   Patient presented to ED with AMS this AM. Apparently had aberrant behavior at home, with dark stools. Father reported that he was using pain medication. Upon arrival to ED, he was afebrile with hypotension-BP 101/70 HR 76 RR 24. WBC 19.1 HGB 9.9 PLT 109 Na 134 K+ 6.8 without evidence of hemolysis, Co2 25  Ca 9.9 Alb 2.1 AST 79 ALT 49 total bili 1.5. CXR with stable cardiomegaly, otherwise unremarkable. CT of head without acute abnormalities. CT of ab/pelvis with small amount of ascites in abdomen and pelvis. Areas of wall thickening involving sigmoid and possibly transverse colon. Cannot exclude areas of colitis. Severe atherosclerotic disease involving the distal abdominal aorta and common iliac arteries.   Seen in ED, he calls me by name but unable to answer questions. Speech is garbled, he is extremely lethargic. He opens eyes and falls asleep again. Unable to obtain HPI from patient. Rest of HPI obtained from EMR.   Past Medical History:  Diagnosis Date  . Arthritis    spine  . Chronic kidney disease    [place] MWF  . Coronary artery disease   . Dialysis patient [facility]) 2013  . Diverticulitis   .  Hyperlipidemia   . Hypertension    dr  Tobie Lords      @ [facility]  med  . Neuromuscular disorder ([place])    nerve pain - tx w/cymbalta  . Neuropathic pain 09/20/2017   Left mid-thoracic   Past Surgical History:  Procedure Laterality Date  . AV FISTULA PLACEMENT  11/10/2011   Procedure: ARTERIOVENOUS (AV) FISTULA CREATION;  Surgeon: Mal Misty, MD;  Location: [place];  Service: Vascular;  Laterality: Left;  Ultrasound guided  . CARDIAC CATHETERIZATION  09/19/2018   On stent study at [facility]  . COLONOSCOPY    . INSERTION OF DIALYSIS CATHETER  02/09/2012   Procedure: INSERTION OF DIALYSIS CATHETER;  Surgeon: Mal Misty, MD;  Location: [place];  Service: Vascular;  Laterality: N/A;  . MASS EXCISION Right 09/07/2018   Procedure: EXCISION MASS RIGHT THUMB;  Surgeon: Charlotte Crumb, MD;  Location: [place];  Service: Orthopedics;  Laterality: Right;  OR AXILLARY BLOCK  . REVISON OF ARTERIOVENOUS FISTULA Left 04/08/2019   Procedure: REVISION PLICATION OF LEFT ARM ARTERIOVENOUS FISTULA;  Surgeon: Serafina Mitchell, MD;  Location: [place];  Service: Vascular;  Laterality: Left;  . RIGHT/LEFT HEART CATH AND CORONARY ANGIOGRAPHY N/A 06/28/2020   Procedure: RIGHT/LEFT HEART CATH AND CORONARY ANGIOGRAPHY;  Surgeon: Lorretta Harp, MD;  Location: [place] CV LAB;  Service: Cardiovascular;  Laterality: N/A;  . SHUNTOGRAM Left 06/03/2012   Procedure: Earney Mallet;  Surgeon: Conrad [place], MD;  Location: [facility] CATH LAB;  Service: Cardiovascular;  Laterality: Left;  . SHUNTOGRAM Left 01/02/2013   Procedure: SHUNTOGRAM;  Surgeon: Conrad Juno Ridge, MD;  Location: [place] CATH LAB;  Service: Cardiovascular;  Laterality: Left;  . TEE WITHOUT CARDIOVERSION N/A 07/01/2020   Procedure: TRANSESOPHAGEAL ECHOCARDIOGRAM (TEE);  Surgeon: Larey Dresser, MD;  Location: [facility] ENDOSCOPY;  Service: Cardiovascular;  Laterality: N/A;   Family History  Problem Relation Age of Onset  . Pancreatic cancer Mother   . Diabetes Father   . Hypertension  Father   . Cancer - Lung Paternal Grandfather    Social History:  reports that he has been smoking cigarettes. He has a 10.75 pack-year smoking history. He has never used smokeless tobacco. He reports current alcohol use. He reports current drug use. Drug: Marijuana. Allergies  Allergen Reactions  . Ace Inhibitors Cough and Shortness Of Breath    Other reaction(s): Breathing Problems Unable to breath Unable to breath  . Lisinopril Shortness Of Breath    Unable to breath   Prior to Admission medications   Medication Sig Start Date End Date Taking? Authorizing Provider  acetaminophen (TYLENOL 8 HOUR) 650 MG CR tablet Take 1 tablet (650 mg total) by mouth every 8 (eight) hours as needed for pain. 03/23/20   Petrucelli, Samantha R, PA-C  amLODipine (NORVASC) 10 MG tablet Take 10 mg by mouth at bedtime.  Patient not taking: Reported on 06/15/2020    [provider]  aspirin EC 81 MG tablet Take 81 mg by mouth daily.    [provider]  atorvastatin (LIPITOR) 80 MG tablet Take 80 mg by mouth daily.  Patient not taking: Reported on 06/15/2020 01/16/19   [provider]  carvedilol (COREG) 3.125 MG tablet Take 3.125 mg by mouth 2 (two) times daily with a meal.  Patient not taking: Reported on 06/15/2020 01/16/19   [provider]  cloNIDine (CATAPRES) 0.1 MG tablet Take 0.2 mg by mouth daily. 07/13/16   [provider]  cyclobenzaprine (FLEXERIL) 10 MG tablet Take 20 mg by mouth 2 (two) times daily as needed for muscle spasms.     [provider]  DULoxetine (CYMBALTA) 60 MG capsule Take 60 mg by mouth every morning.  Patient not taking: Reported on 06/15/2020 01/29/18   [provider]  FOSRENOL 1000 MG chewable tablet Chew 6,000 mg by mouth 3 (three) times daily with meals.  08/31/17   [provider]  gabapentin (NEURONTIN) 100 MG capsule Take 3 capsules (300 mg total) by mouth at bedtime. Patient not taking: Reported on  06/15/2020 09/20/17   Kathrynn Ducking, MD  hydrOXYzine (ATARAX/VISTARIL) 25 MG tablet Take 25 mg by mouth 2 (two) times daily as needed for anxiety or itching (Sleep).     [provider]  lidocaine (LIDODERM) 5 % Place 1 patch onto the skin daily as needed. Apply patch to area most significant pain once per day.  Remove and discard patch within 12 hours of application. Patient taking differently: Place 1 patch onto the skin daily as needed (Apply patch to area most significant pain once per day.  Remove and discard patch within 12 hours of application.).  03/23/20   Petrucelli, Aldona Bar R, PA-C  MISC NATURAL PRODUCTS EX Apply topically. Patient applies horse liniments as needed for chronic low back pain (contains menthol)    [provider]  multivitamin (RENA-VIT) TABS tablet Take 1 tablet by mouth See admin instructions. Every Monday, Wednesday and Friday    [provider]  nortriptyline (PAMELOR) 25 MG capsule Take 25 mg by mouth every morning.  Patient not taking: Reported on 06/15/2020 01/29/18   [provider]  pantoprazole (PROTONIX) 40 MG tablet Take 40 mg by mouth daily.  12/04/12   [provider]  VENTOLIN HFA 108 (90 Base) MCG/ACT inhaler Inhale 2 puffs into the lungs every 6 (six) hours as needed for wheezing or shortness of breath.  12/21/17   [provider]   Current Facility-Administered Medications  Medication Dose Route Frequency Provider Last Rate Last Admin  . Chlorhexidine Gluconate Cloth 2 % PADS 6 each  6 each Topical Q0600 [name], MD      . cinacalcet [facility]) tablet 90 mg  90 mg Oral Q M,W,F-HD [name], MD      . heparin injection 5,000 Units  5,000 Units Subcutaneous Q8H [name], MD      . piperacillin-tazobactam (ZOSYN) IVPB 2.25 g  2.25 g Intravenous Q6H [name], MD      . sodium chloride 0.9 % bolus 1,000 mL  1,000 mL Intravenous Once [name], MD      . sodium thiosulfate 25  g in sodium chloride 0.9 % 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  25 g Intravenous Q M,W,F-HD [name], MD      . vancomycin Alcus Dad) IVPB 2000 mg/400 mL  2,000 mg Intravenous Once [name], MD      . vancomycin variable dose per unstable renal function (pharmacist dosing)   Does not apply See admin instructions [name], MD       Labs: Basic Metabolic Panel: Recent Labs  Lab 08-20-20 0849 08-20-2020 0901  NA 134* 130*  K 6.8* 6.7*  CL 86* 93*  CO2 25  --   GLUCOSE 133* 130*  BUN 78* 81*  CREATININE 8.58* 8.80*  CALCIUM 9.9  --    Liver Function Tests: Recent Labs  Lab 20-Aug-2020 0849  AST 79*  ALT 49*  ALKPHOS 236*  BILITOT 1.5*  PROT 6.7  ALBUMIN 2.1*   Recent Labs  Lab August 20, 2020 0849  LIPASE 20   Recent Labs  Lab 08/20/2020 0849  AMMONIA 45*   CBC: Recent Labs  Lab 2020/08/20 0849 08-20-20 0901  WBC 19.1*  --   NEUTROABS 16.9*  --   HGB 9.9* 10.2*  HCT 31.4* 30.0*  MCV 85.6  --   PLT 107*  --    Cardiac Enzymes: No results for input(s): CKTOTAL, CKMB, CKMBINDEX, TROPONINI in the last 168 hours. CBG: No results for input(s): GLUCAP in the last 168 hours. Iron Studies: No results for input(s): IRON, TIBC, TRANSFERRIN, FERRITIN in the last 72 hours. Studies/Results: CT Angio Head W or Wo Contrast  Result Date: 20-Aug-2020 CLINICAL DATA:  Vertigo, central. Additional provided: Patient reports vertigo and abdominal pain. EXAM: CT ANGIOGRAPHY HEAD AND NECK TECHNIQUE: Multidetector CT imaging of the head and neck was performed using the standard protocol during bolus administration of intravenous contrast. Multiplanar CT image reconstructions and MIPs were obtained to evaluate the vascular anatomy. Carotid stenosis measurements (when applicable) are obtained utilizing NASCET criteria, using the distal internal carotid diameter as the denominator. CONTRAST:  1100mL OMNIPAQUE IOHEXOL 350 MG/ML SOLN COMPARISON:  Brain MRI 02/18/2015. Report from  cervical spine MRI 09/21/2015 (images unavailable). FINDINGS: CT HEAD FINDINGS Brain: Mild cerebral and cerebellar atrophy, advanced for age. Minimal patchy hypoattenuation within the cerebral white matter is nonspecific, but most often secondary to chronic small vessel ischemia. There is no acute intracranial hemorrhage. No demarcated cortical infarct. No extra-axial fluid collection. No evidence of intracranial mass. No midline shift. Vascular: Atherosclerotic calcifications. This includes a calcific focus  within the left sylvian fissure which may reflect calcified atherosclerotic plaque or an age-indeterminate calcified embolus. Skull: Normal. Negative for fracture or focal lesion. Sinuses: Mild scattered paranasal sinus mucosal thickening. Orbits: No mass or acute finding. Review of the MIP images confirms the above findings CTA NECK FINDINGS Aortic arch: Standard aortic branching. Atherosclerotic plaque within the visualized aortic arch and proximal major branch vessels of the neck. No hemodynamically significant innominate or proximal subclavian artery stenosis. Right carotid system: CCA and ICA patent within the neck without significant stenosis (50% or greater). Moderate calcified plaque within the carotid bifurcation and proximal ICA. Left carotid system: CCA and ICA patent within the neck without significant stenosis (50% or greater). Moderate calcified plaque within the mid CCA. Moderate soft and calcified plaque within the distal CCA, carotid bifurcation and proximal ICA. Vertebral arteries: Patent within the neck. The right vertebral artery is dominant. Calcified plaque results in moderate to moderately severe stenosis of the proximal right V1 segment. Nonstenotic calcified plaque at the origin of the left vertebral artery. Skeleton: Cervical spondylosis. Nonspecific 8 mm lucent lesion within the C4 vertebral body. Other neck: No neck mass or cervical lymphadenopathy. Upper chest: No consolidation  within the imaged lung apices. Review of the MIP images confirms the above findings CTA HEAD FINDINGS Anterior circulation: The intracranial internal carotid arteries are patent. Calcified plaque within both vessels. Resultant moderate stenosis of the paraclinoid ICAs bilaterally. The M1 middle cerebral arteries are patent. No M2 proximal branch occlusion is identified. There is a calcified focus within a mid M2 left MCA branch vessel, which may reflect calcified atherosclerotic plaque or an age-indeterminate calcified embolus (series 18, image 28). Finding was discussed by telephone at the time of interpretation on Aug 21, 2020 at 9:45 am with provider Dr. Gregor Hams, who verbally acknowledged these results. Atherosclerotic irregularity of the M2 and more distal MCA branch vessels bilaterally. Most notably, there is a moderate/severe focal stenosis within a mid M2 left MCA branch (series 16, image 22). The anterior cerebral arteries are patent. No intracranial aneurysm is identified. Posterior circulation: The intracranial vertebral arteries are patent. Up to moderate stenosis within the V4 right vertebral artery. There is also moderate stenosis within the V4 left vertebral artery at the vertebrobasilar junction. The basilar artery is patent. The posterior cerebral arteries are patent. Posterior communicating arteries are hypoplastic or absent bilaterally. Venous sinuses: Within the limitations of contrast timing, no convincing thrombus. Anatomic variants: As described. Review of the MIP images confirms the above findings IMPRESSION: CT head: 1. No evidence of acute intracranial abnormality. 2. Mild cerebral and cerebellar atrophy, advanced for age. 3. Minimal patchy cerebral white matter disease, nonspecific but most often secondary to chronic small vessel ischemia. CTA neck: 1. The bilateral common and internal carotid arteries are patent within the neck without hemodynamically significant stenosis. Moderate plaque  within the bilateral carotid systems as described. 2. The vertebral arteries are patent within the neck bilaterally. Calcified plaque results in moderate to moderately severe stenosis of the proximal right V1 segment. CTA head: 1. No intracranial large vessel occlusion. 2. Focus of calcification within a mid M2 left MCA branch vessel which may reflect an age-indeterminate calcified embolus or calcified atherosclerotic plaque. 3. Multifocal intracranial atherosclerotic stenoses, most notably as follows. 4. Moderate stenosis of the paraclinoid ICAs bilaterally. 5. Moderate/severe focal stenosis within a mid M2 left MCA vessel. 6. Moderate stenoses within the V4 vertebral arteries bilaterally. Electronically Signed   By: Kellie Simmering DO   On: 2020-08-21 09:59  CT Angio Neck W and/or Wo Contrast  Result Date: 08-08-20 CLINICAL DATA:  Vertigo, central. Additional provided: Patient reports vertigo and abdominal pain. EXAM: CT ANGIOGRAPHY HEAD AND NECK TECHNIQUE: Multidetector CT imaging of the head and neck was performed using the standard protocol during bolus administration of intravenous contrast. Multiplanar CT image reconstructions and MIPs were obtained to evaluate the vascular anatomy. Carotid stenosis measurements (when applicable) are obtained utilizing NASCET criteria, using the distal internal carotid diameter as the denominator. CONTRAST:  183mL OMNIPAQUE IOHEXOL 350 MG/ML SOLN COMPARISON:  Brain MRI 02/18/2015. Report from cervical spine MRI 09/21/2015 (images unavailable). FINDINGS: CT HEAD FINDINGS Brain: Mild cerebral and cerebellar atrophy, advanced for age. Minimal patchy hypoattenuation within the cerebral white matter is nonspecific, but most often secondary to chronic small vessel ischemia. There is no acute intracranial hemorrhage. No demarcated cortical infarct. No extra-axial fluid collection. No evidence of intracranial mass. No midline shift. Vascular: Atherosclerotic calcifications. This  includes a calcific focus within the left sylvian fissure which may reflect calcified atherosclerotic plaque or an age-indeterminate calcified embolus. Skull: Normal. Negative for fracture or focal lesion. Sinuses: Mild scattered paranasal sinus mucosal thickening. Orbits: No mass or acute finding. Review of the MIP images confirms the above findings CTA NECK FINDINGS Aortic arch: Standard aortic branching. Atherosclerotic plaque within the visualized aortic arch and proximal major branch vessels of the neck. No hemodynamically significant innominate or proximal subclavian artery stenosis. Right carotid system: CCA and ICA patent within the neck without significant stenosis (50% or greater). Moderate calcified plaque within the carotid bifurcation and proximal ICA. Left carotid system: CCA and ICA patent within the neck without significant stenosis (50% or greater). Moderate calcified plaque within the mid CCA. Moderate soft and calcified plaque within the distal CCA, carotid bifurcation and proximal ICA. Vertebral arteries: Patent within the neck. The right vertebral artery is dominant. Calcified plaque results in moderate to moderately severe stenosis of the proximal right V1 segment. Nonstenotic calcified plaque at the origin of the left vertebral artery. Skeleton: Cervical spondylosis. Nonspecific 8 mm lucent lesion within the C4 vertebral body. Other neck: No neck mass or cervical lymphadenopathy. Upper chest: No consolidation within the imaged lung apices. Review of the MIP images confirms the above findings CTA HEAD FINDINGS Anterior circulation: The intracranial internal carotid arteries are patent. Calcified plaque within both vessels. Resultant moderate stenosis of the paraclinoid ICAs bilaterally. The M1 middle cerebral arteries are patent. No M2 proximal branch occlusion is identified. There is a calcified focus within a mid M2 left MCA branch vessel, which may reflect calcified atherosclerotic plaque or  an age-indeterminate calcified embolus (series 18, image 28). Finding was discussed by telephone at the time of interpretation on 08-08-20 at 9:45 am with provider Dr. Gregor Hams, who verbally acknowledged these results. Atherosclerotic irregularity of the M2 and more distal MCA branch vessels bilaterally. Most notably, there is a moderate/severe focal stenosis within a mid M2 left MCA branch (series 16, image 22). The anterior cerebral arteries are patent. No intracranial aneurysm is identified. Posterior circulation: The intracranial vertebral arteries are patent. Up to moderate stenosis within the V4 right vertebral artery. There is also moderate stenosis within the V4 left vertebral artery at the vertebrobasilar junction. The basilar artery is patent. The posterior cerebral arteries are patent. Posterior communicating arteries are hypoplastic or absent bilaterally. Venous sinuses: Within the limitations of contrast timing, no convincing thrombus. Anatomic variants: As described. Review of the MIP images confirms the above findings IMPRESSION: CT head: 1. No  evidence of acute intracranial abnormality. 2. Mild cerebral and cerebellar atrophy, advanced for age. 3. Minimal patchy cerebral white matter disease, nonspecific but most often secondary to chronic small vessel ischemia. CTA neck: 1. The bilateral common and internal carotid arteries are patent within the neck without hemodynamically significant stenosis. Moderate plaque within the bilateral carotid systems as described. 2. The vertebral arteries are patent within the neck bilaterally. Calcified plaque results in moderate to moderately severe stenosis of the proximal right V1 segment. CTA head: 1. No intracranial large vessel occlusion. 2. Focus of calcification within a mid M2 left MCA branch vessel which may reflect an age-indeterminate calcified embolus or calcified atherosclerotic plaque. 3. Multifocal intracranial atherosclerotic stenoses, most  notably as follows. 4. Moderate stenosis of the paraclinoid ICAs bilaterally. 5. Moderate/severe focal stenosis within a mid M2 left MCA vessel. 6. Moderate stenoses within the V4 vertebral arteries bilaterally. Electronically Signed   By: Kellie Simmering DO   On: Jul 31, 2020 09:59   CT ABDOMEN PELVIS W CONTRAST  Addendum Date: 2020/07/31   ADDENDUM REPORT: 07/31/2020 10:01 ADDENDUM: No significant lymph node enlargement in the abdomen or pelvis. Small lymph nodes at the gastrohepatic ligament are similar to the exam on 03/23/2020. Electronically Signed   By: Markus Daft M.D.   On: 2020-07-31 10:01   Result Date: July 31, 2020 CLINICAL DATA:  54 year old dialysis patient with abdominal pain and vertigo. EXAM: CT ABDOMEN AND PELVIS WITH CONTRAST TECHNIQUE: Multidetector CT imaging of the abdomen and pelvis was performed using the standard protocol following bolus administration of intravenous contrast. CONTRAST:  170mL OMNIPAQUE IOHEXOL 350 MG/ML SOLN COMPARISON:  CT abdomen and pelvis 03/23/2020 and chest CT 06/30/2020 FINDINGS: Lower chest: New irregular pleural-based densities at the lung bases likely associated with atelectasis based interval development since 06/30/2020. No large pleural effusions. Heart is enlarged and stable. Again noted is a small amount of pericardial fluid. Hepatobiliary: There may be mild gallbladder wall thickening without significant distention. Small amount of perihepatic ascites is new. No discrete liver lesion. No biliary dilatation. Main portal venous system is patent. Pancreas: Unremarkable. No pancreatic ductal dilatation or surrounding inflammatory changes. Spleen: Small amount of ascites around the spleen. Spleen is normal for size. Adrenals/Urinary Tract: Stable appearance of the adrenal glands. Chronic atrophy of both kidneys with small bilateral renal cysts. No hydronephrosis. Extensive high-density material in the urinary bladder is new from the prior pelvic CT. No bladder  distension. Stomach/Bowel: Rectum is distended with oral contrast. Normal appearance of the stomach. There is no evidence for bowel obstruction. High-density contrast slightly limits evaluation of the colon. Difficult to exclude mild pericolonic edema involving the transverse colon. There is also concern for focal wall thickening and mild pericolonic edema in the sigmoid colon, particularly on sequence 5, image 81. Vascular/Lymphatic: Distal abdominal aorta is heavily calcified. Concern for bilateral common iliac artery stenosis due to extensive calcified plaque. Large calcified plaque in the right common femoral artery which is likely causing hemodynamically significant stenosis. Visceral arteries are heavily calcified. Reproductive: High-density material involving the seminal vesicles may be related to the calcifications. Prostate is normal for size. Other: Small amount of ascites in the right lower quadrant. Small amount of ascites around the liver and spleen. Small amount of fat ascites in the anterior pelvis. Negative for free air. Musculoskeletal: No acute abnormality. Degenerative facet disease in lumbar spine. Diffuse subcutaneous edema. IMPRESSION: 1. Small amount of ascites in the abdomen and pelvis. This is new since July 2021. There may be mild gallbladder  wall thickening which could be related to the ascites. If this is an area of clinical concern, recommend right upper quadrant ultrasound to evaluate the gallbladder. 2. Concern for areas of wall thickening involving the sigmoid colon and possibly the transverse colon. These areas are difficult to evaluate due to the high density contrast in the colon. Areas of colitis cannot be excluded. 3. New high-density material within the urinary bladder. This is new since 03/23/2020. This high-density material appears to be in the urinary bladder wall based on the sagittal reformats and suggestive for dystrophic wall calcifications. Findings may be associated  with the end-stage renal disease and recommend correlation with urinalysis. 4. Chronic cardiomegaly. 5. Chronic renal atrophy with small renal cysts. Findings are compatible with end-stage renal disease. 6. Severe atherosclerotic disease involving the distal abdominal aorta and common iliac arteries. Suspect hemodynamically significant stenosis involving bilateral common iliac arteries and the right common femoral artery. Electronically Signed: By: Markus Daft M.D. On: 2020-08-21 09:50   DG Chest Portable 1 View  Result Date: 2020-08-21 CLINICAL DATA:  Altered mental status. EXAM: PORTABLE CHEST 1 VIEW COMPARISON:  June 27, 2020. FINDINGS: Stable cardiomegaly. Both lungs are clear. The visualized skeletal structures are unremarkable. IMPRESSION: No active disease. Electronically Signed   By: Marijo Conception M.D.   On: 21-Aug-2020 10:08    ROS: As per HPI otherwise negative.    Physical Exam: Vitals:   08-21-2020 1100 August 21, 2020 1115 08/21/20 1145 08-21-2020 1300  BP:  110/69 109/68 105/64  Pulse:   89 85  Resp:  19 17 19   Temp:      TempSrc:      SpO2:  100% 96% 98%  Weight: 97 kg        General: Chronically ill appearing male, extremely lethargic, no acute distress. Head: Normocephalic, atraumatic, sclera non-icteric, mucus membranes are moist Skin: Multiple lesions on UE in various stages of healing.  Neck: Supple. JVD not elevated. Lungs: Clear bilaterally to auscultation without wheezes, rales, or rhonchi. Breathing is unlabored. Heart: RRR with S1 S2. No murmurs, rubs, or gallops appreciated. Abdomen: Soft, non-tender, non-distended with normoactive bowel sounds. No rebound/guarding. No obvious abdominal masses. M-S:  Unable to assess as pt cannot follow directions. Lower extremities:without edema or ischemic changes, no open wounds  Neuro: Oriented to person only Moves all extremities spontaneously but not following commands. Psych:  Unable to answer questions.  Dialysis Access: L  AVF + T/B.   Dialysis Orders: [facility] MWF 4.5 hrs 180NRe 400/Autoflow 2.0 2.0 K/2.0 Ca UFP 2 L AVF -Heparin: None -Sensipar 90 mg PO TIW -Sodium Thiosulfate 25% 25 grams IV TIW   Assessment/Plan: 1.  AMS: Unknown etiology. Being worked up per primary. Neurology consulted. [place] 19.1. Possible sepsis-recent incidental finding of mesenteric abscess/fistula. CT of head without acute abnormalities. Has been on pain medication (Flexeril and vicodin) for back issues. Not known for illicit substance abuse other than marijuana that he used for back pain. BUN 81 may have mild uremia but mental status not improving with HD. Hold all sedating drugs.  2. Hyperkalemia: K+ 6.8. Urgent HD for hyperkalemia. Rec'd calcium gluconate, D50W, insulin in ED.   3. Severe CAD/AS-recent valvulloplasty at [facility]. Cardiology consulted.   4.  ESRD -  MWF HD today on schedule No heparin.  5.  Hypertension/volume  -No evidence of pulmonary edema by CXR but has ankle edema. He has chronic high IDWG. Attempted 5 liters UFG but had to decrease goal D/T hypotension. Medications recently  changed: carvedilol 25 mg PO BID and hydralazine 25 mg PO q 8 hours on DC summary from [place].  6.  Anemia  - HGB 9.9. Has not been on ESA as OP. Family reports dark stools but FOBT negative. Follow HGB.  7.  Metabolic bone disease -  Ca 9.9 C Ca 11.4. Not on VDRA. H/O hyperphosphatemia. Continue Fosrenol when able to eat.  8.  Nutrition -NPO at present.  9.    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  Laresa Oshiro H. Owens Shark, NP-C 08-22-2020, 2:22 PM  [employer] [phone]

## 2020-07-28 NOTE — Death Summary Note (Signed)
DEATH SUMMARY   Patient Details  Name: Marco Arnold MRN: [id number] DOB: 02/27/1966  Admission/Discharge Information   Admit Date:  August 24, 2020  Date of Death: Date of Death: 08/24/20  Time of Death: Time of Death: 12-17-34  Length of Stay: 0  Referring Physician: Cyndi Bender, PA-C   Reason(s) for Hospitalization  Altered Mental Status  Diagnoses  Preliminary cause of death: MRSA Bacteremia Secondary Diagnoses (including complications and co-morbidities):  Active Problems:   AMS (altered mental status) ESRD on HD Coronary Artery Disease Mesenteric Abscess and Fistula  Brief Hospital Course (including significant findings, care, treatment, and services provided and events leading to death)  Marco Arnold is a 54 y.o. year old male with ESRD on HD, CAD s/p LHC 11/20 and 11/21, HFrEF 20-25%, severe AS s/p valvuloplasty 11/19 and recent incidental dx of colonic diverticulosis with mesenteric abscess and fistula who presented from home with altered mental status.   He was noted to have elevated potassium level and was taken for hemodialysis urgently. He also had lactic acidosis, elevated WBC count and CT abdomen showing concerns for colitis, new ascites and gallbladder wall thickening. Blood cultures were drawn and he was treated with vancomycin and zosyn. He became more altered throughout the day and developed respiratory distress while in dialysis. He was taken to the ICU and intubated. After intubation he developed PEA arrest. Patient was coded for nearly an hour before resuscitation efforts were stopped. Time of death was 20:36 on 24-Aug-2020.     Blood cultures returned positive for methicillin resistant staphylococcus aureus.     Pertinent Labs and Studies  Significant Diagnostic Studies CT Angio Head W or Wo Contrast  Result Date: 08/24/20 CLINICAL DATA:  Vertigo, central. Additional provided: Patient reports vertigo and abdominal pain. EXAM: CT ANGIOGRAPHY HEAD AND NECK  TECHNIQUE: Multidetector CT imaging of the head and neck was performed using the standard protocol during bolus administration of intravenous contrast. Multiplanar CT image reconstructions and MIPs were obtained to evaluate the vascular anatomy. Carotid stenosis measurements (when applicable) are obtained utilizing NASCET criteria, using the distal internal carotid diameter as the denominator. CONTRAST:  113mL OMNIPAQUE IOHEXOL 350 MG/ML SOLN COMPARISON:  Brain MRI 02/18/2015. Report from cervical spine MRI 09/21/2015 (images unavailable). FINDINGS: CT HEAD FINDINGS Brain: Mild cerebral and cerebellar atrophy, advanced for age. Minimal patchy hypoattenuation within the cerebral white matter is nonspecific, but most often secondary to chronic small vessel ischemia. There is no acute intracranial hemorrhage. No demarcated cortical infarct. No extra-axial fluid collection. No evidence of intracranial mass. No midline shift. Vascular: Atherosclerotic calcifications. This includes a calcific focus within the left sylvian fissure which may reflect calcified atherosclerotic plaque or an age-indeterminate calcified embolus. Skull: Normal. Negative for fracture or focal lesion. Sinuses: Mild scattered paranasal sinus mucosal thickening. Orbits: No mass or acute finding. Review of the MIP images confirms the above findings CTA NECK FINDINGS Aortic arch: Standard aortic branching. Atherosclerotic plaque within the visualized aortic arch and proximal major branch vessels of the neck. No hemodynamically significant innominate or proximal subclavian artery stenosis. Right carotid system: CCA and ICA patent within the neck without significant stenosis (50% or greater). Moderate calcified plaque within the carotid bifurcation and proximal ICA. Left carotid system: CCA and ICA patent within the neck without significant stenosis (50% or greater). Moderate calcified plaque within the mid CCA. Moderate soft and calcified plaque within  the distal CCA, carotid bifurcation and proximal ICA. Vertebral arteries: Patent within the neck. The right vertebral artery is  dominant. Calcified plaque results in moderate to moderately severe stenosis of the proximal right V1 segment. Nonstenotic calcified plaque at the origin of the left vertebral artery. Skeleton: Cervical spondylosis. Nonspecific 8 mm lucent lesion within the C4 vertebral body. Other neck: No neck mass or cervical lymphadenopathy. Upper chest: No consolidation within the imaged lung apices. Review of the MIP images confirms the above findings CTA HEAD FINDINGS Anterior circulation: The intracranial internal carotid arteries are patent. Calcified plaque within both vessels. Resultant moderate stenosis of the paraclinoid ICAs bilaterally. The M1 middle cerebral arteries are patent. No M2 proximal branch occlusion is identified. There is a calcified focus within a mid M2 left MCA branch vessel, which may reflect calcified atherosclerotic plaque or an age-indeterminate calcified embolus (series 18, image 28). Finding was discussed by telephone at the time of interpretation on 08/16/20 at 9:45 am with provider Dr. Gregor Hams, who verbally acknowledged these results. Atherosclerotic irregularity of the M2 and more distal MCA branch vessels bilaterally. Most notably, there is a moderate/severe focal stenosis within a mid M2 left MCA branch (series 16, image 22). The anterior cerebral arteries are patent. No intracranial aneurysm is identified. Posterior circulation: The intracranial vertebral arteries are patent. Up to moderate stenosis within the V4 right vertebral artery. There is also moderate stenosis within the V4 left vertebral artery at the vertebrobasilar junction. The basilar artery is patent. The posterior cerebral arteries are patent. Posterior communicating arteries are hypoplastic or absent bilaterally. Venous sinuses: Within the limitations of contrast timing, no convincing thrombus.  Anatomic variants: As described. Review of the MIP images confirms the above findings IMPRESSION: CT head: 1. No evidence of acute intracranial abnormality. 2. Mild cerebral and cerebellar atrophy, advanced for age. 3. Minimal patchy cerebral white matter disease, nonspecific but most often secondary to chronic small vessel ischemia. CTA neck: 1. The bilateral common and internal carotid arteries are patent within the neck without hemodynamically significant stenosis. Moderate plaque within the bilateral carotid systems as described. 2. The vertebral arteries are patent within the neck bilaterally. Calcified plaque results in moderate to moderately severe stenosis of the proximal right V1 segment. CTA head: 1. No intracranial large vessel occlusion. 2. Focus of calcification within a mid M2 left MCA branch vessel which may reflect an age-indeterminate calcified embolus or calcified atherosclerotic plaque. 3. Multifocal intracranial atherosclerotic stenoses, most notably as follows. 4. Moderate stenosis of the paraclinoid ICAs bilaterally. 5. Moderate/severe focal stenosis within a mid M2 left MCA vessel. 6. Moderate stenoses within the V4 vertebral arteries bilaterally. Electronically Signed   By: Kellie Simmering DO   On: August 16, 2020 09:59   CT Angio Neck W and/or Wo Contrast  Result Date: 2020-08-16 CLINICAL DATA:  Vertigo, central. Additional provided: Patient reports vertigo and abdominal pain. EXAM: CT ANGIOGRAPHY HEAD AND NECK TECHNIQUE: Multidetector CT imaging of the head and neck was performed using the standard protocol during bolus administration of intravenous contrast. Multiplanar CT image reconstructions and MIPs were obtained to evaluate the vascular anatomy. Carotid stenosis measurements (when applicable) are obtained utilizing NASCET criteria, using the distal internal carotid diameter as the denominator. CONTRAST:  132mL OMNIPAQUE IOHEXOL 350 MG/ML SOLN COMPARISON:  Brain MRI 02/18/2015. Report  from cervical spine MRI 09/21/2015 (images unavailable). FINDINGS: CT HEAD FINDINGS Brain: Mild cerebral and cerebellar atrophy, advanced for age. Minimal patchy hypoattenuation within the cerebral white matter is nonspecific, but most often secondary to chronic small vessel ischemia. There is no acute intracranial hemorrhage. No demarcated cortical infarct. No extra-axial fluid collection.  No evidence of intracranial mass. No midline shift. Vascular: Atherosclerotic calcifications. This includes a calcific focus within the left sylvian fissure which may reflect calcified atherosclerotic plaque or an age-indeterminate calcified embolus. Skull: Normal. Negative for fracture or focal lesion. Sinuses: Mild scattered paranasal sinus mucosal thickening. Orbits: No mass or acute finding. Review of the MIP images confirms the above findings CTA NECK FINDINGS Aortic arch: Standard aortic branching. Atherosclerotic plaque within the visualized aortic arch and proximal major branch vessels of the neck. No hemodynamically significant innominate or proximal subclavian artery stenosis. Right carotid system: CCA and ICA patent within the neck without significant stenosis (50% or greater). Moderate calcified plaque within the carotid bifurcation and proximal ICA. Left carotid system: CCA and ICA patent within the neck without significant stenosis (50% or greater). Moderate calcified plaque within the mid CCA. Moderate soft and calcified plaque within the distal CCA, carotid bifurcation and proximal ICA. Vertebral arteries: Patent within the neck. The right vertebral artery is dominant. Calcified plaque results in moderate to moderately severe stenosis of the proximal right V1 segment. Nonstenotic calcified plaque at the origin of the left vertebral artery. Skeleton: Cervical spondylosis. Nonspecific 8 mm lucent lesion within the C4 vertebral body. Other neck: No neck mass or cervical lymphadenopathy. Upper chest: No consolidation  within the imaged lung apices. Review of the MIP images confirms the above findings CTA HEAD FINDINGS Anterior circulation: The intracranial internal carotid arteries are patent. Calcified plaque within both vessels. Resultant moderate stenosis of the paraclinoid ICAs bilaterally. The M1 middle cerebral arteries are patent. No M2 proximal branch occlusion is identified. There is a calcified focus within a mid M2 left MCA branch vessel, which may reflect calcified atherosclerotic plaque or an age-indeterminate calcified embolus (series 18, image 28). Finding was discussed by telephone at the time of interpretation on 08-14-20 at 9:45 am with provider Dr. Gregor Hams, who verbally acknowledged these results. Atherosclerotic irregularity of the M2 and more distal MCA branch vessels bilaterally. Most notably, there is a moderate/severe focal stenosis within a mid M2 left MCA branch (series 16, image 22). The anterior cerebral arteries are patent. No intracranial aneurysm is identified. Posterior circulation: The intracranial vertebral arteries are patent. Up to moderate stenosis within the V4 right vertebral artery. There is also moderate stenosis within the V4 left vertebral artery at the vertebrobasilar junction. The basilar artery is patent. The posterior cerebral arteries are patent. Posterior communicating arteries are hypoplastic or absent bilaterally. Venous sinuses: Within the limitations of contrast timing, no convincing thrombus. Anatomic variants: As described. Review of the MIP images confirms the above findings IMPRESSION: CT head: 1. No evidence of acute intracranial abnormality. 2. Mild cerebral and cerebellar atrophy, advanced for age. 3. Minimal patchy cerebral white matter disease, nonspecific but most often secondary to chronic small vessel ischemia. CTA neck: 1. The bilateral common and internal carotid arteries are patent within the neck without hemodynamically significant stenosis. Moderate plaque  within the bilateral carotid systems as described. 2. The vertebral arteries are patent within the neck bilaterally. Calcified plaque results in moderate to moderately severe stenosis of the proximal right V1 segment. CTA head: 1. No intracranial large vessel occlusion. 2. Focus of calcification within a mid M2 left MCA branch vessel which may reflect an age-indeterminate calcified embolus or calcified atherosclerotic plaque. 3. Multifocal intracranial atherosclerotic stenoses, most notably as follows. 4. Moderate stenosis of the paraclinoid ICAs bilaterally. 5. Moderate/severe focal stenosis within a mid M2 left MCA vessel. 6. Moderate stenoses within the V4 vertebral  arteries bilaterally. Electronically Signed   By: Kellie Simmering DO   On: 08/06/2020 09:59   CT chest without contrast  Result Date: 06/30/2020 CLINICAL DATA:  Thoracic aortic disease, preoperative planning EXAM: CT CHEST WITHOUT CONTRAST TECHNIQUE: Multidetector CT imaging of the chest was performed following the standard protocol without IV contrast. COMPARISON:  07/09/2018 FINDINGS: Cardiovascular: Densely calcified coronary arteries. Moderate aortic calcifications. No aneurysm. Maximum aortic diameter 3.6 cm in the ascending thoracic aorta. Densely calcified mitral valve annulus. Mediastinum/Nodes: No mediastinal, hilar, or axillary adenopathy. Trachea and esophagus are unremarkable. Thyroid unremarkable. Lungs/Pleura: Lungs are clear. No focal airspace opacities or suspicious nodules. No effusions. Upper Abdomen: Imaging into the upper abdomen demonstrates no acute findings. Severely atrophic kidneys bilaterally. Musculoskeletal: Chest wall soft tissues are unremarkable. No acute bony abnormality. IMPRESSION: No evidence of aortic aneurysm. Heavily calcified coronary arteries and mitral valve annulus. Aortic Atherosclerosis (ICD10-I70.0). Electronically Signed   By: Rolm Baptise M.D.   On: 06/30/2020 22:03   CT ABDOMEN PELVIS W  CONTRAST  Addendum Date: 08-06-20   ADDENDUM REPORT: 08/06/2020 10:01 ADDENDUM: No significant lymph node enlargement in the abdomen or pelvis. Small lymph nodes at the gastrohepatic ligament are similar to the exam on 03/23/2020. Electronically Signed   By: Markus Daft M.D.   On: 06-Aug-2020 10:01   Result Date: 08/06/2020 CLINICAL DATA:  54 year old dialysis patient with abdominal pain and vertigo. EXAM: CT ABDOMEN AND PELVIS WITH CONTRAST TECHNIQUE: Multidetector CT imaging of the abdomen and pelvis was performed using the standard protocol following bolus administration of intravenous contrast. CONTRAST:  177mL OMNIPAQUE IOHEXOL 350 MG/ML SOLN COMPARISON:  CT abdomen and pelvis 03/23/2020 and chest CT 06/30/2020 FINDINGS: Lower chest: New irregular pleural-based densities at the lung bases likely associated with atelectasis based interval development since 06/30/2020. No large pleural effusions. Heart is enlarged and stable. Again noted is a small amount of pericardial fluid. Hepatobiliary: There may be mild gallbladder wall thickening without significant distention. Small amount of perihepatic ascites is new. No discrete liver lesion. No biliary dilatation. Main portal venous system is patent. Pancreas: Unremarkable. No pancreatic ductal dilatation or surrounding inflammatory changes. Spleen: Small amount of ascites around the spleen. Spleen is normal for size. Adrenals/Urinary Tract: Stable appearance of the adrenal glands. Chronic atrophy of both kidneys with small bilateral renal cysts. No hydronephrosis. Extensive high-density material in the urinary bladder is new from the prior pelvic CT. No bladder distension. Stomach/Bowel: Rectum is distended with oral contrast. Normal appearance of the stomach. There is no evidence for bowel obstruction. High-density contrast slightly limits evaluation of the colon. Difficult to exclude mild pericolonic edema involving the transverse colon. There is also concern  for focal wall thickening and mild pericolonic edema in the sigmoid colon, particularly on sequence 5, image 81. Vascular/Lymphatic: Distal abdominal aorta is heavily calcified. Concern for bilateral common iliac artery stenosis due to extensive calcified plaque. Large calcified plaque in the right common femoral artery which is likely causing hemodynamically significant stenosis. Visceral arteries are heavily calcified. Reproductive: High-density material involving the seminal vesicles may be related to the calcifications. Prostate is normal for size. Other: Small amount of ascites in the right lower quadrant. Small amount of ascites around the liver and spleen. Small amount of fat ascites in the anterior pelvis. Negative for free air. Musculoskeletal: No acute abnormality. Degenerative facet disease in lumbar spine. Diffuse subcutaneous edema. IMPRESSION: 1. Small amount of ascites in the abdomen and pelvis. This is new since July 2021. There may be  mild gallbladder wall thickening which could be related to the ascites. If this is an area of clinical concern, recommend right upper quadrant ultrasound to evaluate the gallbladder. 2. Concern for areas of wall thickening involving the sigmoid colon and possibly the transverse colon. These areas are difficult to evaluate due to the high density contrast in the colon. Areas of colitis cannot be excluded. 3. New high-density material within the urinary bladder. This is new since 03/23/2020. This high-density material appears to be in the urinary bladder wall based on the sagittal reformats and suggestive for dystrophic wall calcifications. Findings may be associated with the end-stage renal disease and recommend correlation with urinalysis. 4. Chronic cardiomegaly. 5. Chronic renal atrophy with small renal cysts. Findings are compatible with end-stage renal disease. 6. Severe atherosclerotic disease involving the distal abdominal aorta and common iliac arteries. Suspect  hemodynamically significant stenosis involving bilateral common iliac arteries and the right common femoral artery. Electronically Signed: By: Markus Daft M.D. On: 2020/08/18 09:50   CARDIAC CATHETERIZATION  Result Date: 06/28/2020  Ost LM to Dist LM lesion is 80% stenosed.  Previously placed Ost LAD to Mid LAD stent (unknown type) is widely patent.  Hemodynamic findings consistent with aortic valve stenosis.  Marco Arnold is a 54 y.o. male  [id number] LOCATION:  FACILITY: [facility] PHYSICIAN: Quay Burow, M.D. 1965/10/03 DATE OF PROCEDURE:  06/28/2020 DATE OF DISCHARGE: CARDIAC CATHETERIZATION History obtained from chart review.54 y.o. male with a hx of CAD with s/p LAD PCI 06/2019 at [facility], AS, ESRD, tobacco use who was admitted with chest pain and shortness of breath.  His troponins were mildly elevated in the 300 range but flat.  His EKG shows bundle branch block.  2D echo showed EF of 25 to 30% with severe aortic stenosis.  Resents now for right left heart cath to define his anatomy and physiology he did undergo hemodialysis today because of hyperkalemia PROCEDURE DESCRIPTION: The patient was brought to the second floor [facility] cath lab in the postabsorptive state. He was premedicated with IV Versed and fentanyl. His right groin was prepped and shaved in usual sterile fashion. Xylocaine 1% was used for local anesthesia. A 5 French sheath was inserted into the right common femoral artery using standard Seldinger technique.  A 7 French sheath was inserted into the right common femoral vein.  Both were inserted using ultrasound guided access and digital images were captured and saved.  A 7 [place] balloontipped thermal dilution Swan-Ganz catheter was then advanced to the right heart chambers obtaining sequential pressures and blood samples for the determination of Fick and thermodilution cardiac output.  5 French right left Judkins diagnostic catheters along with a 5 [place] EBU guide catheter used  for selective coronary angiography and obtaining left heart pressures.  Isovue dye was used for the entirety of the case.  Retrograde aortic, left ventricular and pullback pressures were recorded.  Right common femoral angiogram was performed and Mynx closure devices were successfully deployed in the right common femoral artery and vein.   Mr. Doyon has patent LAD stents although there does appear to be in-stent restenosis within in the proximal/ostial portion.  He has a left dominant system.  He does have an 80% eccentric distal left main stenosis seen in multiple views which appears hemodynamically significant.  I did cross the aortic valve demonstrated no pullback gradient.  Based on his anatomy, he will require CABG plus or minus aortic valve replacement depending on the transesophageal echo performed at the  time of bypass grafting.  Mynx closure devices were successfully deployed in both the right common femoral artery and vein.  The patient left lab in stable condition.  Dr. Gardiner Rhyme was notified of these results. Quay Burow. MD, [facility] 06/28/2020 4:27 PM   DG Chest Portable 1 View  Result Date: 08/01/20 CLINICAL DATA:  Altered mental status. EXAM: PORTABLE CHEST 1 VIEW COMPARISON:  June 27, 2020. FINDINGS: Stable cardiomegaly. Both lungs are clear. The visualized skeletal structures are unremarkable. IMPRESSION: No active disease. Electronically Signed   By: Marijo Conception M.D.   On: 2020-08-01 10:08   DG Foot 2 Views Left  Result Date: 07/03/2020 CLINICAL DATA:  Bilateral foot pain and swelling. No reported injury. EXAM: LEFT FOOT - 2 VIEW COMPARISON:  None. FINDINGS: Diffuse soft tissue swelling. Vascular calcifications throughout the soft tissues. No fracture or dislocation. No suspicious focal osseous lesions. No os erosions or periosteal reaction. Small Achilles and plantar left calcaneal spurs. Degenerative changes in the dorsal tarsal joints. Pes planus deformity. No radiopaque  foreign body. IMPRESSION: 1. Diffuse soft tissue swelling. No acute osseous abnormality. 2. Degenerative changes in the dorsal tarsal joints. Small Achilles and plantar left calcaneal spurs. Pes planus deformity. Electronically Signed   By: Ilona Sorrel M.D.   On: 07/03/2020 17:06   DG Foot 2 Views Right  Result Date: 07/03/2020 CLINICAL DATA:  Bilateral foot pain and swelling. No reported injury. EXAM: RIGHT FOOT - 2 VIEW COMPARISON:  None. FINDINGS: Vascular calcifications throughout the soft tissues. Diffuse soft tissue swelling. No fracture or dislocation. No erosions or periosteal reaction. No suspicious focal osseous lesions. Mild first and second MTP joint osteoarthritis. Small Achilles and plantar right calcaneal spurs. Degenerative changes in the dorsal tarsal joints. No radiopaque foreign bodies. Pes planus deformity. IMPRESSION: 1. Diffuse soft tissue swelling. No acute osseous abnormality. 2. Mild first and second MTP joint osteoarthritis. Degenerative changes in the dorsal tarsal joints. Small Achilles and plantar right calcaneal spurs. Electronically Signed   By: Ilona Sorrel M.D.   On: 07/03/2020 17:04   ECHO TEE  Result Date: 07/01/2020    TRANSESOPHOGEAL ECHO REPORT   Patient Name:   Marco Arnold Date of Exam: 07/01/2020 Medical Rec #:  [id number]     Height:       75.5 in Accession #:    [id number]    Weight:       216.5 lb Date of Birth:  November 25, 1965     BSA:          2.281 m Patient Age:    32 years      BP:           106/88 mmHg Patient Gender: M             HR:           68 bpm. Exam Location:  Inpatient Procedure: Transesophageal Echo, Cardiac Doppler and Color Doppler Indications:     I35.0 Nonrheumatic aortic (valve) stenosis  History:         Patient has prior history of Echocardiogram examinations, most                  recent 06/27/2020. CAD, Mitral Valve Disease and Aortic Valve                  Disease; Risk Factors:Hypertension and Dyslipidemia. CKD.  Sonographer:     Jonelle Sidle  Dance Referring Phys:  [place] Diagnosing Phys: Loralie Champagne MD  PROCEDURE: The transesophogeal probe was passed without difficulty through the esophogus of the patient. Local oropharyngeal anesthetic was provided with Cetacaine. Sedation performed by different physician. The patient was monitored while under deep sedation. Anesthestetic sedation was provided intravenously by Anesthesiology: 177.48mg  of Propofol, 100mg  of Lidocaine. The patient developed no complications during the procedure. IMPRESSIONS  1. Left ventricular ejection fraction, by estimation, is 20 to 25%. The left ventricle has severely decreased function. The left ventricle demonstrates global hypokinesis. The left ventricular internal cavity size was mildly dilated. There is mild left ventricular hypertrophy.  2. Right ventricular systolic function is moderately reduced. The right ventricular size is mildly enlarged.  3. Left atrial size was moderately dilated. No left atrial/left atrial appendage thrombus was detected.  4. Right atrial size was moderately dilated.  5. The mitral valve is normal in structure. Moderate mitral valve regurgitation. No evidence of mitral stenosis.  6. Peak RV-RA gradient 29 mmHg. Tricuspid valve regurgitation is moderate.  7. There was moderate to severe aortic stenosis with mean gradient 24 mmHg, AVA 0.84 cm^2 by continuity equation and 1.2 cm^2 by planimetry. The aortic valve is tricuspid. Aortic valve regurgitation is not visualized.  8. Small PFO noted by color doppler.  9. Normal caliber thoracic aorta with mild plaque. FINDINGS  Left Ventricle: Left ventricular ejection fraction, by estimation, is 20 to 25%. The left ventricle has severely decreased function. The left ventricle demonstrates global hypokinesis. The left ventricular internal cavity size was mildly dilated. There is mild left ventricular hypertrophy. Right Ventricle: The right ventricular size is mildly enlarged. No increase in right  ventricular wall thickness. Right ventricular systolic function is moderately reduced. Left Atrium: Left atrial size was moderately dilated. No left atrial/left atrial appendage thrombus was detected. Right Atrium: Right atrial size was moderately dilated. Pericardium: Trivial pericardial effusion is present. Mitral Valve: The mitral valve is normal in structure. Moderate mitral valve regurgitation. No evidence of mitral valve stenosis. Tricuspid Valve: Peak RV-RA gradient 29 mmHg. The tricuspid valve is normal in structure. Tricuspid valve regurgitation is moderate. Aortic Valve: There was moderate to severe aortic stenosis with mean gradient 24 mmHg, AVA 0.84 cm^2 by continuity equation and 1.2 cm^2 by planimetry. The aortic valve is tricuspid. Aortic valve regurgitation is not visualized. Aortic valve mean gradient measures 22.0 mmHg. Aortic valve peak gradient measures 35.5 mmHg. Aortic valve area, by VTI measures 0.87 cm. Pulmonic Valve: The pulmonic valve was normal in structure. Pulmonic valve regurgitation is not visualized. Aorta: Normal caliber thoracic aorta with mild plaque. and the aortic arch was not well visualized. IAS/Shunts: Small PFO noted by color doppler.  LEFT VENTRICLE PLAX 2D LVOT diam:     2.10 cm LV SV:         49 LV SV Index:   21 LVOT Area:     3.46 cm  AORTIC VALVE AV Area (Vmax):    0.95 cm AV Area (Vmean):   0.80 cm AV Area (VTI):     0.87 cm AV Vmax:           298.00 cm/s AV Vmean:          219.000 cm/s AV VTI:            0.564 m AV Peak Grad:      35.5 mmHg AV Mean Grad:      22.0 mmHg LVOT Vmax:         81.50 cm/s LVOT Vmean:        50.400 cm/s LVOT  VTI:          0.141 m LVOT/AV VTI ratio: 0.25 TRICUSPID VALVE TR Peak grad:   27.5 mmHg TR Vmax:        262.00 cm/s  SHUNTS Systemic VTI:  0.14 m Systemic Diam: 2.10 cm Loralie Champagne MD Electronically signed by Loralie Champagne MD Signature Date/Time: 07/01/2020/8:56:00 PM    Final    VAS US DOPPLER PRE CABG  Result Date:  07/01/2020 PREOPERATIVE VASCULAR EVALUATION  Risk Factors:     Hypertension, current smoker, coronary artery disease. Other Factors:    ESRD, on dialysis, aortic and mitral valve disease. Comparison Study: No prior study Performing Technologist: Sharion Dove RVS Supporting Technologist: Abram Sander RVS, Darlin Coco RDMS  Examination Guidelines: A complete evaluation includes B-mode imaging, spectral Doppler, color Doppler, and power Doppler as needed of all accessible portions of each vessel. Bilateral testing is considered an integral part of a complete examination. Limited examinations for reoccurring indications may be performed as noted.  Right Carotid Findings: +----------+--------+--------+--------+--------+------------------+           PSV cm/sEDV cm/sStenosisDescribeComments           +----------+--------+--------+--------+--------+------------------+ CCA Prox  44      7                       intimal thickening +----------+--------+--------+--------+--------+------------------+ CCA Distal30      7                       intimal thickening +----------+--------+--------+--------+--------+------------------+ ICA Prox  34      13      1-39%   calcific                   +----------+--------+--------+--------+--------+------------------+ ICA Distal44      16                                         +----------+--------+--------+--------+--------+------------------+ ECA       64      6                                          +----------+--------+--------+--------+--------+------------------+ Portions of this table do not appear on this page. +----------+--------+-------+----------------+------------+           PSV cm/sEDV cmsDescribe        Arm Pressure +----------+--------+-------+----------------+------------+ [id number]             Multiphasic, WNL             +----------+--------+-------+----------------+------------+  +---------+--------+--+--------+--+---------+ VertebralPSV cm/s38EDV cm/s12Antegrade +---------+--------+--+--------+--+---------+ Left Carotid Findings: +----------+--------+--------+--------+--------+------------------+           PSV cm/sEDV cm/sStenosisDescribeComments           +----------+--------+--------+--------+--------+------------------+ CCA Prox  35      13                      intimal thickening +----------+--------+--------+--------+--------+------------------+ CCA Distal47      12                      intimal thickening +----------+--------+--------+--------+--------+------------------+ ICA Prox  35      13      1-39%   calcific                   +----------+--------+--------+--------+--------+------------------+  ICA Distal43      14                                         +----------+--------+--------+--------+--------+------------------+ ECA       37      1                                          +----------+--------+--------+--------+--------+------------------+ +----------+--------+--------+----------------+------------+ SubclavianPSV cm/sEDV cm/sDescribe        Arm Pressure +----------+--------+--------+----------------+------------+           26              Multiphasic, WNL             +----------+--------+--------+----------------+------------+ +---------+--------+--+--------+-+---------+ VertebralPSV cm/s26EDV cm/s8Antegrade +---------+--------+--+--------+-+---------+  ABI Findings: +--------+------------------+-----+----------+--------+ Right   Rt Pressure (mmHg)IndexWaveform  Comment  +--------+------------------+-----+----------+--------+ [id number]                    triphasic          +--------+------------------+-----+----------+--------+ PTA     255               1.72 monophasic         +--------+------------------+-----+----------+--------+ DP      255               1.72 monophasic          +--------+------------------+-----+----------+--------+ +--------+------------------+-----+----------+-----------+ Left    Lt Pressure (mmHg)IndexWaveform  Comment     +--------+------------------+-----+----------+-----------+ Brachial                                 A-V fistula +--------+------------------+-----+----------+-----------+ PTA     255               1.72 monophasic            +--------+------------------+-----+----------+-----------+ DP      148               1.00 monophasic            +--------+------------------+-----+----------+-----------+ +-------+---------------+----------------+ ABI/TBIToday's ABI/TBIPrevious ABI/TBI +-------+---------------+----------------+ Right  [place]                              +-------+---------------+----------------+ Left   1.0                             +-------+---------------+----------------+  Right Doppler Findings: +--------+--------+-----+---------+--------+ Site    PressureIndexDoppler  Comments +--------+--------+-----+---------+--------+ [id number]          triphasic         +--------+--------+-----+---------+--------+ Radial               triphasic         +--------+--------+-----+---------+--------+ Ulnar                triphasic         +--------+--------+-----+---------+--------+  Left Doppler Findings: +--------+--------+-----+--------+-----------+ Site    PressureIndexDoppler Comments    +--------+--------+-----+--------+-----------+ Brachial                     A-V fistula +--------+--------+-----+--------+-----------+ Radial  biphasic            +--------+--------+-----+--------+-----------+  Summary: Right Carotid: Velocities in the right ICA are consistent with a 1-39% stenosis. Left Carotid: Velocities in the left ICA are consistent with a 1-39% stenosis. Vertebrals:  Bilateral vertebral arteries demonstrate antegrade flow. Subclavians: Normal flow hemodynamics  were seen in bilateral subclavian              arteries. Right ABI: Resting right ankle-brachial index indicates noncompressible right lower extremity arteries. ABIs are unreliable. Left ABI: Resting left ankle-brachial index is within normal range. No evidence of significant left lower extremity arterial disease. ABIs are unreliable. Right Upper Extremity: Doppler waveform obliterate with right radial compression. Doppler waveforms remain within normal limits with right ulnar compression.  Electronically signed by Jamelle Haring on 07/01/2020 at 7:08:34 PM.    Final     Microbiology Recent Results (from the past 240 hour(s))  Resp Panel by RT-PCR (Flu A&B, Covid) Nasopharyngeal Swab     Status: None   Collection Time: 08/20/2020  8:39 AM   Specimen: Nasopharyngeal Swab; Nasopharyngeal(NP) swabs in vial transport medium  Result Value Ref Range Status   SARS Coronavirus 2 by RT PCR NEGATIVE NEGATIVE Final    Comment: (NOTE) SARS-CoV-2 target nucleic acids are NOT DETECTED.  The SARS-CoV-2 RNA is generally detectable in upper respiratory specimens during the acute phase of infection. The lowest concentration of SARS-CoV-2 viral copies this assay can detect is 138 copies/mL. A negative result does not preclude SARS-Cov-2 infection and should not be used as the sole basis for treatment or other patient management decisions. A negative result may occur with  improper specimen collection/handling, submission of specimen other than nasopharyngeal swab, presence of viral mutation(s) within the areas targeted by this assay, and inadequate number of viral copies(<138 copies/mL). A negative result must be combined with clinical observations, patient history, and epidemiological information. The expected result is Negative.  Fact Sheet for Patients:  EntrepreneurPulse.com.au  Fact Sheet for Healthcare Providers:  IncredibleEmployment.be  This test is no t yet  approved or cleared by the [place] FDA and  has been authorized for detection and/or diagnosis of SARS-CoV-2 by FDA under an Emergency Use Authorization (EUA). This EUA will remain  in effect (meaning this test can be used) for the duration of the COVID-19 declaration under Section 564(b)(1) of the Act, 21 U.S.C.section 360bbb-3(b)(1), unless the authorization is terminated  or revoked sooner.       Influenza A by PCR NEGATIVE NEGATIVE Final   Influenza B by PCR NEGATIVE NEGATIVE Final    Comment: (NOTE) The Xpert Xpress SARS-CoV-2/FLU/RSV plus assay is intended as an aid in the diagnosis of influenza from Nasopharyngeal swab specimens and should not be used as a sole basis for treatment. Nasal washings and aspirates are unacceptable for Xpert Xpress SARS-CoV-2/FLU/RSV testing.  Fact Sheet for Patients: EntrepreneurPulse.com.au  Fact Sheet for Healthcare Providers: IncredibleEmployment.be  This test is not yet approved or cleared by the [place] FDA and has been authorized for detection and/or diagnosis of SARS-CoV-2 by FDA under an Emergency Use Authorization (EUA). This EUA will remain in effect (meaning this test can be used) for the duration of the COVID-19 declaration under Section 564(b)(1) of the Act, 21 U.S.C. section 360bbb-3(b)(1), unless the authorization is terminated or revoked.  Performed at [facility], [place] [address], [place], [place] [address]   Blood culture (routine x 2)     Status: Abnormal (Preliminary result)   Collection  Time: 08-08-20 12:00 PM   Specimen: BLOOD RIGHT HAND  Result Value Ref Range Status   Specimen Description BLOOD RIGHT HAND  Final   Special Requests   Final    BOTTLES DRAWN AEROBIC ONLY Blood Culture results may not be optimal due to an inadequate volume of blood received in culture bottles   Culture  Setup Time   Final    GRAM POSITIVE COCCI AEROBIC BOTTLE ONLY CRITICAL VALUE  NOTED.  VALUE IS CONSISTENT WITH PREVIOUSLY REPORTED AND CALLED VALUE. Performed at [facility], [place] [address] [place], [place] [address]    Culture STAPHYLOCOCCUS AUREUS (A)  Final   Report Status PENDING  Incomplete  Blood culture (routine x 2)     Status: Abnormal (Preliminary result)   Collection Time: 08-Aug-2020 12:10 PM   Specimen: BLOOD RIGHT HAND  Result Value Ref Range Status   Specimen Description BLOOD RIGHT HAND  Final   Special Requests   Final    BOTTLES DRAWN AEROBIC ONLY Blood Culture results may not be optimal due to an inadequate volume of blood received in culture bottles   Culture  Setup Time   Final    GRAM POSITIVE COCCI AEROBIC BOTTLE ONLY Organism ID to follow PATIENT DISCHARGED OR EXPIRED    Culture (A)  Final    STAPHYLOCOCCUS AUREUS CULTURE REINCUBATED FOR BETTER GROWTH Performed at [facility], [place] [address] [place], [place] [address]    Report Status PENDING  Incomplete  Blood Culture ID Panel (Reflexed)     Status: Abnormal   Collection Time: 08-Aug-2020 12:10 PM  Result Value Ref Range Status   Enterococcus faecalis NOT DETECTED NOT DETECTED Final   Enterococcus Faecium NOT DETECTED NOT DETECTED Final   Listeria monocytogenes NOT DETECTED NOT DETECTED Final   Staphylococcus species DETECTED (A) NOT DETECTED Final    Comment: PATIENT DISCHARGED OR EXPIRED   Staphylococcus aureus (BCID) DETECTED (A) NOT DETECTED Final    Comment: Methicillin (oxacillin)-resistant Staphylococcus aureus (MRSA). MRSA is predictably resistant to beta-lactam antibiotics (except ceftaroline). Preferred therapy is vancomycin unless clinically contraindicated. Patient requires contact precautions if  hospitalized. PATIENT DISCHARGED OR EXPIRED    Staphylococcus epidermidis NOT DETECTED NOT DETECTED Final   Staphylococcus lugdunensis NOT DETECTED NOT DETECTED Final   Streptococcus species NOT DETECTED NOT DETECTED Final   Streptococcus agalactiae NOT DETECTED  NOT DETECTED Final   Streptococcus pneumoniae NOT DETECTED NOT DETECTED Final   Streptococcus pyogenes NOT DETECTED NOT DETECTED Final   A.calcoaceticus-baumannii NOT DETECTED NOT DETECTED Final   Bacteroides fragilis NOT DETECTED NOT DETECTED Final   Enterobacterales NOT DETECTED NOT DETECTED Final   Enterobacter cloacae complex NOT DETECTED NOT DETECTED Final   Escherichia coli NOT DETECTED NOT DETECTED Final   Klebsiella aerogenes NOT DETECTED NOT DETECTED Final   Klebsiella oxytoca NOT DETECTED NOT DETECTED Final   Klebsiella pneumoniae NOT DETECTED NOT DETECTED Final   Proteus species NOT DETECTED NOT DETECTED Final   Salmonella species NOT DETECTED NOT DETECTED Final   Serratia marcescens NOT DETECTED NOT DETECTED Final   Haemophilus influenzae NOT DETECTED NOT DETECTED Final   Neisseria meningitidis NOT DETECTED NOT DETECTED Final   Pseudomonas aeruginosa NOT DETECTED NOT DETECTED Final   Stenotrophomonas maltophilia NOT DETECTED NOT DETECTED Final   Candida albicans NOT DETECTED NOT DETECTED Final   Candida auris NOT DETECTED NOT DETECTED Final   Candida glabrata NOT DETECTED NOT DETECTED Final   Candida krusei NOT DETECTED NOT DETECTED Final   Candida  parapsilosis NOT DETECTED NOT DETECTED Final   Candida tropicalis NOT DETECTED NOT DETECTED Final   Cryptococcus neoformans/gattii NOT DETECTED NOT DETECTED Final   Meth resistant mecA/C and MREJ DETECTED (A) NOT DETECTED Final    Comment: PATIENT DISCHARGED OR EXPIRED Performed at [facility], [place] [address], [place], [place] [address]     Lab Basic Metabolic Panel: Recent Labs  Lab 08/08/2020 0849 Aug 08, 2020 0901 08-Aug-2020 1832 2020-08-08 2006 08-08-20 2013  NA 134* 130* 135 134* 134*  K 6.8* 6.7* 6.3* 6.3* 6.2*  CL 86* 93* 94* 96*  --   CO2 25  --  17*  --   --   GLUCOSE 133* 130* 94 91  --   BUN 78* 81* 36* 34*  --   CREATININE 8.58* 8.80* 4.64* 4.70*  --   CALCIUM 9.9  --  9.7  --   --    Liver Function  Tests: Recent Labs  Lab Aug 08, 2020 0849  AST 79*  ALT 49*  ALKPHOS 236*  BILITOT 1.5*  PROT 6.7  ALBUMIN 2.1*   Recent Labs  Lab 08-08-20 0849  LIPASE 20   Recent Labs  Lab 08-08-2020 0849  AMMONIA 45*   CBC: Recent Labs  Lab 2020/08/08 0849 08-08-20 0901 08/08/2020 2006 Aug 08, 2020 2013  WBC 19.1*  --   --   --   NEUTROABS 16.9*  --   --   --   HGB 9.9* 10.2* 11.2* 10.2*  HCT 31.4* 30.0* 33.0* 30.0*  MCV 85.6  --   --   --   PLT 107*  --   --   --    Cardiac Enzymes: No results for input(s): CKTOTAL, CKMB, CKMBINDEX, TROPONINI in the last 168 hours. Sepsis Labs: Recent Labs  Lab August 08, 2020 0849 August 08, 2020 1143 2020/08/08 1740 Aug 08, 2020 1832 2020-08-08 2017  PROCALCITON  --   --   --  26.16  --   WBC 19.1*  --   --   --   --   LATICACIDVEN 4.3* 6.2* 1.7  --  9.3*    Procedures/Operations  Endotracheal Intubation Hemodialysis   Freddi Starr 07/27/2020, 4:34 PM

## 2020-07-28 NOTE — ED Provider Notes (Signed)
[facility] Provider Note   CSN: [id number] Arrival date & time: 18-Aug-2020  2952     History Chief Complaint  Patient presents with  . Altered Mental Status  . Weakness    [facility] is a 54 y.o. male.  HPI      54yo male with history of CAD, CHF, hlpd, HTN, ESRD MWF HD, recent LHC 06/28/2020 left main distal stenosis, EF 20-25% moderate-severe AD, felt to be high risk and was seen in office and referred for direct admission to [facility] where he was found to need no intervention for CAD but did have severe AS and had successful balloon aortic valvuloplasty 11/19, had incidental finding of colonic diverticulosis with fistula formation and central mesenteric abascess and was seen by GI and Geneeral surgery, ID was consulted for lower extremity wounds, who now presents with concern for altered mental status, generalized weakness, confusion which was noted by family when he woke this AM. Last known normal 7PM when daughter left-notes he was maybe awake later with his father but that he is developing dementia and may not be reliable historian.   Called daughter, Marco [facility] Last night appeared to be normal, some shortness of breath This morning, grandfather reports he woke up and fell out of the bed, confused, saying random things, brother got him dressed trying to take him to dialysis, then he had syncopal episode after screaming "wake me up." Grandfather/patient's father thinks he was high, had pain pills.  Did have dark stools per someone at scene but daughter had not heard that.  Daughter reports he has used marijuana--previously smoked but not for several weeks but thinks he may have had edibles-- but not known recently and does not believe he takes other medications not as prescribed or other drugs.  Dialysis NP also came to bedside and reports she has not seen him altered like this before.  Discussed with son-reports he was out of it-not wearing clothes,  tried to assist him to the car, essentially collapsed. Had otherwise been in normal state of health since his recent admission- no fevers/cough/falls    Past Medical History:  Diagnosis Date  . Arthritis    spine  . Chronic kidney disease    [place] MWF  . Coronary artery disease   . Dialysis patient [facility]) 2013  . Diverticulitis   . Hyperlipidemia   . Hypertension    dr  Tobie Lords      @ [employer]  med  . Neuromuscular disorder ([place])    nerve pain - tx w/cymbalta  . Neuropathic pain 09/20/2017   Left mid-thoracic    Patient Active Problem List   Diagnosis Date Noted  . AMS (altered mental status) 08/18/2020  . Goals of care, counseling/discussion   . Palliative care encounter   . Prediabetes   . Shortness of breath   . ESRD (end stage renal disease) on dialysis ([place])   . Chest pain 06/27/2020  . Chronic night sweats 06/15/2020  . Tinea pedis 06/15/2020  . MRSA (methicillin resistant Staphylococcus aureus) infection 06/15/2020  . Hyperkalemia 05/26/2019  . Hyponatremia 05/26/2019  . CAD (coronary artery disease) 05/26/2019  . Complete heart block, transient ([place]) 05/26/2019  . Neuropathic pain 09/20/2017  . Thoracic disc herniation 03/29/2017  . Chronic bilateral low back pain without sciatica 03/29/2017  . Other sleep disturbances 12/05/2012  . End stage renal disease ([place]) 12/05/2011  . ESRD (end stage renal disease) ([place]) 11/11/2011  . Anemia of chronic disease  11/11/2011  . Hyperparathyroidism, secondary renal ([place]) 11/11/2011  . HTN (hypertension) 11/11/2011  . Acute kidney injury ([place]) 11/06/2011    Past Surgical History:  Procedure Laterality Date  . AV FISTULA PLACEMENT  11/10/2011   Procedure: ARTERIOVENOUS (AV) FISTULA CREATION;  Surgeon: Mal Misty, MD;  Location: [place];  Service: Vascular;  Laterality: Left;  Ultrasound guided  . CARDIAC CATHETERIZATION  09/19/2018   On stent study at [facility]  . COLONOSCOPY    . INSERTION OF DIALYSIS CATHETER   02/09/2012   Procedure: INSERTION OF DIALYSIS CATHETER;  Surgeon: Mal Misty, MD;  Location: [place];  Service: Vascular;  Laterality: N/A;  . MASS EXCISION Right 09/07/2018   Procedure: EXCISION MASS RIGHT THUMB;  Surgeon: Charlotte Crumb, MD;  Location: [place];  Service: Orthopedics;  Laterality: Right;  OR AXILLARY BLOCK  . REVISON OF ARTERIOVENOUS FISTULA Left 04/08/2019   Procedure: REVISION PLICATION OF LEFT ARM ARTERIOVENOUS FISTULA;  Surgeon: Serafina Mitchell, MD;  Location: [place];  Service: Vascular;  Laterality: Left;  . RIGHT/LEFT HEART CATH AND CORONARY ANGIOGRAPHY N/A 06/28/2020   Procedure: RIGHT/LEFT HEART CATH AND CORONARY ANGIOGRAPHY;  Surgeon: Lorretta Harp, MD;  Location: [place] CV LAB;  Service: Cardiovascular;  Laterality: N/A;  . SHUNTOGRAM Left 06/03/2012   Procedure: Earney Mallet;  Surgeon: Conrad Ridge Manor, MD;  Location: [facility] CATH LAB;  Service: Cardiovascular;  Laterality: Left;  . SHUNTOGRAM Left 01/02/2013   Procedure: SHUNTOGRAM;  Surgeon: Conrad Providence, MD;  Location: [facility] CATH LAB;  Service: Cardiovascular;  Laterality: Left;  . TEE WITHOUT CARDIOVERSION N/A 07/01/2020   Procedure: TRANSESOPHAGEAL ECHOCARDIOGRAM (TEE);  Surgeon: Larey Dresser, MD;  Location: [facility] ENDOSCOPY;  Service: Cardiovascular;  Laterality: N/A;       Family History  Problem Relation Age of Onset  . Pancreatic cancer Mother   . Diabetes Father   . Hypertension Father   . Cancer - Lung Paternal Grandfather     Social History   Tobacco Use  . Smoking status: Light Tobacco Smoker    Packs/day: 0.25    Years: 43.00    Pack years: 10.75    Types: Cigarettes  . Smokeless tobacco: Never Used  . Tobacco comment: pt states that he is trying to quit--is trying to cut back at the current time  Vaping Use  . Vaping Use: Former  . Quit date: 08/28/2018  Substance Use Topics  . Alcohol use: Yes    Comment: occasional  . Drug use: Yes    Types: Marijuana    Home Medications Prior to  Admission medications   Medication Sig Start Date End Date Taking? Authorizing Provider  albuterol (VENTOLIN HFA) 108 (90 Base) MCG/ACT inhaler Inhale 2 puffs into the lungs every 6 (six) hours as needed for wheezing or shortness of breath.   Yes [provider]  aspirin EC 81 MG tablet Take 81 mg by mouth daily.   Yes [provider]  atorvastatin (LIPITOR) 80 MG tablet Take 80 mg by mouth at bedtime.  01/16/19  Yes [provider]  carvedilol (COREG) 12.5 MG tablet Take 12.5 mg by mouth 2 (two) times daily. 07/17/20  Yes [provider]  cyclobenzaprine (FLEXERIL) 10 MG tablet Take 10 mg by mouth 2 (two) times daily as needed for muscle spasms.    Yes [provider]  hydrALAZINE (APRESOLINE) 25 MG tablet Take 25 mg by mouth 3 (three) times daily. 07/17/20  Yes [provider]  HYDROcodone-acetaminophen (NORCO/VICODIN) 5-325  MG tablet Take 1 tablet by mouth 2 (two) times daily as needed (pain).   Yes [provider]  hydrOXYzine (ATARAX/VISTARIL) 25 MG tablet Take 25 mg by mouth 2 (two) times daily as needed for anxiety or itching (Sleep).    Yes [provider]  isosorbide mononitrate (IMDUR) 30 MG 24 hr tablet Take 30 mg by mouth daily. 07/17/20  Yes [provider]  lanthanum (FOSRENOL) 1000 MG chewable tablet Chew 1,000-4,000 mg by mouth See admin instructions. Take 4 tablets (4000 mg) by mouth three times daily with meals and 1 tablet (1000 mg) two times daily with snacks   Yes [provider]  MISC NATURAL PRODUCTS EX Apply topically. Patient applies horse liniments as needed for chronic low back pain (contains menthol)   Yes [provider]  pantoprazole (PROTONIX) 40 MG tablet Take 40 mg by mouth daily.  12/04/12  Yes [provider]  acetaminophen (TYLENOL 8 HOUR) 650 MG CR tablet Take 1 tablet (650 mg total) by mouth every 8 (eight) hours as needed for pain. Patient not taking: Reported  on 08-15-20 03/23/20   Petrucelli, Aldona Bar R, PA-C  amoxicillin-clavulanate (AUGMENTIN) 500-125 MG tablet Take 1 tablet by mouth at bedtime. 07/17/20   [provider]  lidocaine (LIDODERM) 5 % Place 1 patch onto the skin daily as needed. Apply patch to area most significant pain once per day.  Remove and discard patch within 12 hours of application. Patient not taking: Reported on 15-Aug-2020 03/23/20   Petrucelli, Aldona Bar R, PA-C    Allergies    Ace inhibitors and Lisinopril  Review of Systems   Review of Systems  Unable to perform ROS: Mental status change  Constitutional: Positive for fatigue. Negative for fever.  Respiratory: Positive for shortness of breath (family reports he had dyspnea last night). Negative for cough.   Gastrointestinal: Positive for abdominal pain.  Musculoskeletal: Positive for arthralgias.  Skin: Positive for wound. Negative for rash.  Neurological: Positive for headaches.  Psychiatric/Behavioral: Positive for confusion.    Physical Exam Updated Vital Signs BP (!) 83/57 (BP Location: Right Arm)   Pulse 96   Temp (!) 101.1 F (38.4 C) (Axillary)   Resp (!) 71   Ht 6\' 3"  (1.905 m)   Wt 95 kg   SpO2 100%   BMI 26.18 kg/m   Physical Exam Vitals and nursing note reviewed.  Constitutional:      General: He is not in acute distress.    Appearance: He is well-developed. He is ill-appearing and toxic-appearing. He is not diaphoretic.     Comments: sleepy  HENT:     Head: Normocephalic and atraumatic.  Eyes:     Conjunctiva/sclera: Conjunctivae normal.  Cardiovascular:     Rate and Rhythm: Normal rate and regular rhythm.     Heart sounds: Normal heart sounds. No murmur heard.  No friction rub. No gallop.   Pulmonary:     Effort: Pulmonary effort is normal. No respiratory distress.     Breath sounds: Normal breath sounds. No wheezing or rales.  Abdominal:     General: There is no distension.     Palpations: Abdomen is soft.      Tenderness: There is abdominal tenderness (diffuse). There is no guarding.     Comments: Contusions lower abdomen, right groin  Musculoskeletal:     Cervical back: Normal range of motion.  Skin:    General: Skin is warm and dry.     Coloration: Skin is pale.  Neurological:     Comments: Oriented to self, hospital, not able to state date Resting vertical nystagmus Unable to participate in finger to nose bilaterally, sleepy, confused Equal RUE strength, equal strength bilateral LE No facial droop Denies visual field changes or sensory changes Able to name objects, repeat no ifs and or buts     ED Results / Procedures / Treatments   Labs (all labs ordered are listed, but only abnormal results are displayed) Labs Reviewed  AMMONIA - Abnormal; Notable for the following components:      Result Value   Ammonia 45 (*)    All other components within normal limits  COMPREHENSIVE METABOLIC PANEL - Abnormal; Notable for the following components:   Sodium 134 (*)    Potassium 6.8 (*)    Chloride 86 (*)    Glucose, Bld 133 (*)    BUN 78 (*)    Creatinine, Ser 8.58 (*)    Albumin 2.1 (*)    AST 79 (*)    ALT 49 (*)    Alkaline Phosphatase 236 (*)    Total Bilirubin 1.5 (*)    GFR, Estimated 7 (*)    Anion gap 23 (*)    All other components within normal limits  SALICYLATE LEVEL - Abnormal; Notable for the following components:   Salicylate Lvl <5.3 (*)    All other components within normal limits  ACETAMINOPHEN LEVEL - Abnormal; Notable for the following components:   Acetaminophen (Tylenol), Serum <10 (*)    All other components within normal limits  CBC WITH DIFFERENTIAL/PLATELET - Abnormal; Notable for the following components:   WBC 19.1 (*)    RBC 3.67 (*)    Hemoglobin 9.9 (*)    HCT 31.4 (*)    RDW 17.3 (*)    Platelets 107 (*)    Neutro Abs 16.9 (*)    Lymphs Abs 0.5 (*)    Monocytes Absolute 1.2 (*)    Abs Immature Granulocytes 0.53 (*)    All other components  within normal limits  PROTIME-INR - Abnormal; Notable for the following components:   Prothrombin Time 17.6 (*)    INR 1.5 (*)    All other components within normal limits  LACTIC ACID, PLASMA - Abnormal; Notable for the following components:   Lactic Acid, Venous 4.3 (*)    All other components within normal limits  LACTIC ACID, PLASMA - Abnormal; Notable for the following components:   Lactic Acid, Venous 6.2 (*)    All other components within normal limits  BRAIN NATRIURETIC PEPTIDE - Abnormal; Notable for the following components:   B Natriuretic Peptide >4,500.0 (*)    All other components within normal limits  LACTIC ACID, PLASMA - Abnormal; Notable for the following components:   Lactic Acid, Venous 9.3 (*)    All other components within normal limits  BLOOD GAS, ARTERIAL - Abnormal; Notable for the following components:   pH, Arterial 7.557 (*)    Bicarbonate 31.1 (*)    Acid-Base Excess 8.1 (*)    All other components within normal limits  BASIC METABOLIC PANEL - Abnormal; Notable for the following components:   Potassium 6.3 (*)    Chloride 94 (*)    CO2 17 (*)    BUN 36 (*)    Creatinine, Ser 4.64 (*)    GFR, Estimated 14 (*)    Anion gap 24 (*)    All other components within normal limits  I-STAT CHEM 8, ED - Abnormal; Notable for  the following components:   Sodium 130 (*)    Potassium 6.7 (*)    Chloride 93 (*)    BUN 81 (*)    Creatinine, Ser 8.80 (*)    Glucose, Bld 130 (*)    Calcium, Ion 1.14 (*)    Hemoglobin 10.2 (*)    HCT 30.0 (*)    All other components within normal limits  POCT I-STAT, CHEM 8 - Abnormal; Notable for the following components:   Sodium 134 (*)    Potassium 6.3 (*)    Chloride 96 (*)    BUN 34 (*)    Creatinine, Ser 4.70 (*)    TCO2 21 (*)    Hemoglobin 11.2 (*)    HCT 33.0 (*)    All other components within normal limits  POCT I-STAT EG7 - Abnormal; Notable for the following components:   pH, Ven 7.483 (*)    pCO2, Ven 29.0  (*)    pO2, Ven 174.0 (*)    Sodium 134 (*)    Potassium 6.2 (*)    HCT 30.0 (*)    Hemoglobin 10.2 (*)    All other components within normal limits  TROPONIN I (HIGH SENSITIVITY) - Abnormal; Notable for the following components:   Troponin I (High Sensitivity) 988 (*)    All other components within normal limits  TROPONIN I (HIGH SENSITIVITY) - Abnormal; Notable for the following components:   Troponin I (High Sensitivity) 1,574 (*)    All other components within normal limits  TROPONIN I (HIGH SENSITIVITY) - Abnormal; Notable for the following components:   Troponin I (High Sensitivity) 2,898 (*)    All other components within normal limits  RESP PANEL BY RT-PCR (FLU A&B, COVID) ARPGX2  URINE CULTURE  CULTURE, BLOOD (ROUTINE X 2)  CULTURE, BLOOD (ROUTINE X 2)  ETHANOL  LIPASE, BLOOD  VITAMIN B12  FOLATE  LACTIC ACID, PLASMA  PROCALCITONIN  GLUCOSE, CAPILLARY  GLUCOSE, CAPILLARY  RAPID URINE DRUG SCREEN, HOSP PERFORMED  URINALYSIS, ROUTINE W REFLEX MICROSCOPIC  VITAMIN B1  COMPREHENSIVE METABOLIC PANEL  CBC WITH DIFFERENTIAL/PLATELET  RENAL FUNCTION PANEL  MAGNESIUM  POC OCCULT BLOOD, ED  TYPE AND SCREEN  ABO/RH  TROPONIN I (HIGH SENSITIVITY)    EKG EKG Interpretation  Date/Time:  08/07/20 08:03:40 EST Ventricular Rate:  77 PR Interval:    QRS Duration: 197 QT Interval:  476 QTC Calculation: 539 R Axis:   -69 Text Interpretation: Sinus rhythm Prolonged PR interval IVCD, consider atypical RBBB LVH with IVCD, LAD and secondary repol abnrm Prolonged QT interval No significant change since last tracing Confirmed by Gareth Morgan [phone]) on 2020/08/07 8:11:17 AM   Radiology CT Angio Head W or Wo Contrast  Result Date: 07-Aug-2020 CLINICAL DATA:  Vertigo, central. Additional provided: Patient reports vertigo and abdominal pain. EXAM: CT ANGIOGRAPHY HEAD AND NECK TECHNIQUE: Multidetector CT imaging of the head and neck was performed using the  standard protocol during bolus administration of intravenous contrast. Multiplanar CT image reconstructions and MIPs were obtained to evaluate the vascular anatomy. Carotid stenosis measurements (when applicable) are obtained utilizing NASCET criteria, using the distal internal carotid diameter as the denominator. CONTRAST:  175mL OMNIPAQUE IOHEXOL 350 MG/ML SOLN COMPARISON:  Brain MRI 02/18/2015. Report from cervical spine MRI 09/21/2015 (images unavailable). FINDINGS: CT HEAD FINDINGS Brain: Mild cerebral and cerebellar atrophy, advanced for age. Minimal patchy hypoattenuation within the cerebral white matter is nonspecific, but most often secondary to chronic small vessel ischemia. There is no  acute intracranial hemorrhage. No demarcated cortical infarct. No extra-axial fluid collection. No evidence of intracranial mass. No midline shift. Vascular: Atherosclerotic calcifications. This includes a calcific focus within the left sylvian fissure which may reflect calcified atherosclerotic plaque or an age-indeterminate calcified embolus. Skull: Normal. Negative for fracture or focal lesion. Sinuses: Mild scattered paranasal sinus mucosal thickening. Orbits: No mass or acute finding. Review of the MIP images confirms the above findings CTA NECK FINDINGS Aortic arch: Standard aortic branching. Atherosclerotic plaque within the visualized aortic arch and proximal major branch vessels of the neck. No hemodynamically significant innominate or proximal subclavian artery stenosis. Right carotid system: CCA and ICA patent within the neck without significant stenosis (50% or greater). Moderate calcified plaque within the carotid bifurcation and proximal ICA. Left carotid system: CCA and ICA patent within the neck without significant stenosis (50% or greater). Moderate calcified plaque within the mid CCA. Moderate soft and calcified plaque within the distal CCA, carotid bifurcation and proximal ICA. Vertebral arteries: Patent  within the neck. The right vertebral artery is dominant. Calcified plaque results in moderate to moderately severe stenosis of the proximal right V1 segment. Nonstenotic calcified plaque at the origin of the left vertebral artery. Skeleton: Cervical spondylosis. Nonspecific 8 mm lucent lesion within the C4 vertebral body. Other neck: No neck mass or cervical lymphadenopathy. Upper chest: No consolidation within the imaged lung apices. Review of the MIP images confirms the above findings CTA HEAD FINDINGS Anterior circulation: The intracranial internal carotid arteries are patent. Calcified plaque within both vessels. Resultant moderate stenosis of the paraclinoid ICAs bilaterally. The M1 middle cerebral arteries are patent. No M2 proximal branch occlusion is identified. There is a calcified focus within a mid M2 left MCA branch vessel, which may reflect calcified atherosclerotic plaque or an age-indeterminate calcified embolus (series 18, image 28). Finding was discussed by telephone at the time of interpretation on 08-01-2020 at 9:45 am with provider Dr. Gregor Hams, who verbally acknowledged these results. Atherosclerotic irregularity of the M2 and more distal MCA branch vessels bilaterally. Most notably, there is a moderate/severe focal stenosis within a mid M2 left MCA branch (series 16, image 22). The anterior cerebral arteries are patent. No intracranial aneurysm is identified. Posterior circulation: The intracranial vertebral arteries are patent. Up to moderate stenosis within the V4 right vertebral artery. There is also moderate stenosis within the V4 left vertebral artery at the vertebrobasilar junction. The basilar artery is patent. The posterior cerebral arteries are patent. Posterior communicating arteries are hypoplastic or absent bilaterally. Venous sinuses: Within the limitations of contrast timing, no convincing thrombus. Anatomic variants: As described. Review of the MIP images confirms the above  findings IMPRESSION: CT head: 1. No evidence of acute intracranial abnormality. 2. Mild cerebral and cerebellar atrophy, advanced for age. 3. Minimal patchy cerebral white matter disease, nonspecific but most often secondary to chronic small vessel ischemia. CTA neck: 1. The bilateral common and internal carotid arteries are patent within the neck without hemodynamically significant stenosis. Moderate plaque within the bilateral carotid systems as described. 2. The vertebral arteries are patent within the neck bilaterally. Calcified plaque results in moderate to moderately severe stenosis of the proximal right V1 segment. CTA head: 1. No intracranial large vessel occlusion. 2. Focus of calcification within a mid M2 left MCA branch vessel which may reflect an age-indeterminate calcified embolus or calcified atherosclerotic plaque. 3. Multifocal intracranial atherosclerotic stenoses, most notably as follows. 4. Moderate stenosis of the paraclinoid ICAs bilaterally. 5. Moderate/severe focal stenosis within a mid  M2 left MCA vessel. 6. Moderate stenoses within the V4 vertebral arteries bilaterally. Electronically Signed   By: Kellie Simmering DO   On: 2020-08-13 09:59   CT Angio Neck W and/or Wo Contrast  Result Date: 08/13/2020 CLINICAL DATA:  Vertigo, central. Additional provided: Patient reports vertigo and abdominal pain. EXAM: CT ANGIOGRAPHY HEAD AND NECK TECHNIQUE: Multidetector CT imaging of the head and neck was performed using the standard protocol during bolus administration of intravenous contrast. Multiplanar CT image reconstructions and MIPs were obtained to evaluate the vascular anatomy. Carotid stenosis measurements (when applicable) are obtained utilizing NASCET criteria, using the distal internal carotid diameter as the denominator. CONTRAST:  143mL OMNIPAQUE IOHEXOL 350 MG/ML SOLN COMPARISON:  Brain MRI 02/18/2015. Report from cervical spine MRI 09/21/2015 (images unavailable). FINDINGS: CT HEAD  FINDINGS Brain: Mild cerebral and cerebellar atrophy, advanced for age. Minimal patchy hypoattenuation within the cerebral white matter is nonspecific, but most often secondary to chronic small vessel ischemia. There is no acute intracranial hemorrhage. No demarcated cortical infarct. No extra-axial fluid collection. No evidence of intracranial mass. No midline shift. Vascular: Atherosclerotic calcifications. This includes a calcific focus within the left sylvian fissure which may reflect calcified atherosclerotic plaque or an age-indeterminate calcified embolus. Skull: Normal. Negative for fracture or focal lesion. Sinuses: Mild scattered paranasal sinus mucosal thickening. Orbits: No mass or acute finding. Review of the MIP images confirms the above findings CTA NECK FINDINGS Aortic arch: Standard aortic branching. Atherosclerotic plaque within the visualized aortic arch and proximal major branch vessels of the neck. No hemodynamically significant innominate or proximal subclavian artery stenosis. Right carotid system: CCA and ICA patent within the neck without significant stenosis (50% or greater). Moderate calcified plaque within the carotid bifurcation and proximal ICA. Left carotid system: CCA and ICA patent within the neck without significant stenosis (50% or greater). Moderate calcified plaque within the mid CCA. Moderate soft and calcified plaque within the distal CCA, carotid bifurcation and proximal ICA. Vertebral arteries: Patent within the neck. The right vertebral artery is dominant. Calcified plaque results in moderate to moderately severe stenosis of the proximal right V1 segment. Nonstenotic calcified plaque at the origin of the left vertebral artery. Skeleton: Cervical spondylosis. Nonspecific 8 mm lucent lesion within the C4 vertebral body. Other neck: No neck mass or cervical lymphadenopathy. Upper chest: No consolidation within the imaged lung apices. Review of the MIP images confirms the above  findings CTA HEAD FINDINGS Anterior circulation: The intracranial internal carotid arteries are patent. Calcified plaque within both vessels. Resultant moderate stenosis of the paraclinoid ICAs bilaterally. The M1 middle cerebral arteries are patent. No M2 proximal branch occlusion is identified. There is a calcified focus within a mid M2 left MCA branch vessel, which may reflect calcified atherosclerotic plaque or an age-indeterminate calcified embolus (series 18, image 28). Finding was discussed by telephone at the time of interpretation on 08/13/20 at 9:45 am with provider Dr. Gregor Hams, who verbally acknowledged these results. Atherosclerotic irregularity of the M2 and more distal MCA branch vessels bilaterally. Most notably, there is a moderate/severe focal stenosis within a mid M2 left MCA branch (series 16, image 22). The anterior cerebral arteries are patent. No intracranial aneurysm is identified. Posterior circulation: The intracranial vertebral arteries are patent. Up to moderate stenosis within the V4 right vertebral artery. There is also moderate stenosis within the V4 left vertebral artery at the vertebrobasilar junction. The basilar artery is patent. The posterior cerebral arteries are patent. Posterior communicating arteries are hypoplastic or absent bilaterally.  Venous sinuses: Within the limitations of contrast timing, no convincing thrombus. Anatomic variants: As described. Review of the MIP images confirms the above findings IMPRESSION: CT head: 1. No evidence of acute intracranial abnormality. 2. Mild cerebral and cerebellar atrophy, advanced for age. 3. Minimal patchy cerebral white matter disease, nonspecific but most often secondary to chronic small vessel ischemia. CTA neck: 1. The bilateral common and internal carotid arteries are patent within the neck without hemodynamically significant stenosis. Moderate plaque within the bilateral carotid systems as described. 2. The vertebral  arteries are patent within the neck bilaterally. Calcified plaque results in moderate to moderately severe stenosis of the proximal right V1 segment. CTA head: 1. No intracranial large vessel occlusion. 2. Focus of calcification within a mid M2 left MCA branch vessel which may reflect an age-indeterminate calcified embolus or calcified atherosclerotic plaque. 3. Multifocal intracranial atherosclerotic stenoses, most notably as follows. 4. Moderate stenosis of the paraclinoid ICAs bilaterally. 5. Moderate/severe focal stenosis within a mid M2 left MCA vessel. 6. Moderate stenoses within the V4 vertebral arteries bilaterally. Electronically Signed   By: Kellie Simmering DO   On: 07/30/2020 09:59   CT ABDOMEN PELVIS W CONTRAST  Addendum Date: July 30, 2020   ADDENDUM REPORT: July 30, 2020 10:01 ADDENDUM: No significant lymph node enlargement in the abdomen or pelvis. Small lymph nodes at the gastrohepatic ligament are similar to the exam on 03/23/2020. Electronically Signed   By: Markus Daft M.D.   On: 07-30-2020 10:01   Result Date: 07/30/2020 CLINICAL DATA:  54 year old dialysis patient with abdominal pain and vertigo. EXAM: CT ABDOMEN AND PELVIS WITH CONTRAST TECHNIQUE: Multidetector CT imaging of the abdomen and pelvis was performed using the standard protocol following bolus administration of intravenous contrast. CONTRAST:  115mL OMNIPAQUE IOHEXOL 350 MG/ML SOLN COMPARISON:  CT abdomen and pelvis 03/23/2020 and chest CT 06/30/2020 FINDINGS: Lower chest: New irregular pleural-based densities at the lung bases likely associated with atelectasis based interval development since 06/30/2020. No large pleural effusions. Heart is enlarged and stable. Again noted is a small amount of pericardial fluid. Hepatobiliary: There may be mild gallbladder wall thickening without significant distention. Small amount of perihepatic ascites is new. No discrete liver lesion. No biliary dilatation. Main portal venous system is patent.  Pancreas: Unremarkable. No pancreatic ductal dilatation or surrounding inflammatory changes. Spleen: Small amount of ascites around the spleen. Spleen is normal for size. Adrenals/Urinary Tract: Stable appearance of the adrenal glands. Chronic atrophy of both kidneys with small bilateral renal cysts. No hydronephrosis. Extensive high-density material in the urinary bladder is new from the prior pelvic CT. No bladder distension. Stomach/Bowel: Rectum is distended with oral contrast. Normal appearance of the stomach. There is no evidence for bowel obstruction. High-density contrast slightly limits evaluation of the colon. Difficult to exclude mild pericolonic edema involving the transverse colon. There is also concern for focal wall thickening and mild pericolonic edema in the sigmoid colon, particularly on sequence 5, image 81. Vascular/Lymphatic: Distal abdominal aorta is heavily calcified. Concern for bilateral common iliac artery stenosis due to extensive calcified plaque. Large calcified plaque in the right common femoral artery which is likely causing hemodynamically significant stenosis. Visceral arteries are heavily calcified. Reproductive: High-density material involving the seminal vesicles may be related to the calcifications. Prostate is normal for size. Other: Small amount of ascites in the right lower quadrant. Small amount of ascites around the liver and spleen. Small amount of fat ascites in the anterior pelvis. Negative for free air. Musculoskeletal: No acute abnormality. Degenerative facet  disease in lumbar spine. Diffuse subcutaneous edema. IMPRESSION: 1. Small amount of ascites in the abdomen and pelvis. This is new since July 2021. There may be mild gallbladder wall thickening which could be related to the ascites. If this is an area of clinical concern, recommend right upper quadrant ultrasound to evaluate the gallbladder. 2. Concern for areas of wall thickening involving the sigmoid colon and  possibly the transverse colon. These areas are difficult to evaluate due to the high density contrast in the colon. Areas of colitis cannot be excluded. 3. New high-density material within the urinary bladder. This is new since 03/23/2020. This high-density material appears to be in the urinary bladder wall based on the sagittal reformats and suggestive for dystrophic wall calcifications. Findings may be associated with the end-stage renal disease and recommend correlation with urinalysis. 4. Chronic cardiomegaly. 5. Chronic renal atrophy with small renal cysts. Findings are compatible with end-stage renal disease. 6. Severe atherosclerotic disease involving the distal abdominal aorta and common iliac arteries. Suspect hemodynamically significant stenosis involving bilateral common iliac arteries and the right common femoral artery. Electronically Signed: By: Markus Daft M.D. On: 08-23-20 09:50   DG Chest Portable 1 View  Result Date: 08/23/20 CLINICAL DATA:  Altered mental status. EXAM: PORTABLE CHEST 1 VIEW COMPARISON:  June 27, 2020. FINDINGS: Stable cardiomegaly. Both lungs are clear. The visualized skeletal structures are unremarkable. IMPRESSION: No active disease. Electronically Signed   By: Marijo Conception M.D.   On: 08-23-20 10:08    Procedures .Critical Care Performed by: Gareth Morgan, MD Authorized by: Gareth Morgan, MD   Critical care provider statement:    Critical care time (minutes):  90   Critical care was necessary to treat or prevent imminent or life-threatening deterioration of the following conditions:  Metabolic crisis and CNS failure or compromise   Critical care was time spent personally by me on the following activities:  Discussions with consultants, evaluation of patient's response to treatment, examination of patient, ordering and performing treatments and interventions, ordering and review of laboratory studies, ordering and review of radiographic studies,  pulse oximetry, re-evaluation of patient's condition, obtaining history from patient or surrogate and review of old charts   (including critical care time)  Medications Ordered in ED Medications  Chlorhexidine Gluconate Cloth 2 % PADS 6 each (has no administration in time range)  cinacalcet (SENSIPAR) tablet 90 mg (90 mg Oral Not Given 2020-08-23 1628)  sodium thiosulfate 25 g in sodium chloride 0.9 % 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 (25 g Intravenous New Bag/Given August 23, 2020 1559)  sodium chloride 0.9 % bolus 1,000 mL (has no administration in time range)  heparin injection 5,000 Units (has no administration in time range)  vancomycin (VANCOCIN) IVPB 1000 mg/200 mL premix (has no administration in time range)  piperacillin-tazobactam (ZOSYN) IVPB 2.25 g (has no administration in time range)  lactulose (CHRONULAC) 10 GM/15ML solution 10 g (has no administration in time range)  rocuronium bromide 100 MG/10ML SOSY (has no administration in time range)  etomidate (AMIDATE) 2 MG/ML injection (has no administration in time range)  midazolam (VERSED) 2 MG/2ML injection (has no administration in time range)  fentaNYL (SUBLIMAZE) 100 MCG/2ML injection (has no administration in time range)  norepinephrine (LEVOPHED) 4-5 MG/250ML-% infusion SOLN (has no administration in time range)  phenylephrine (NEOSYNEPHRINE) 10-0.9 MG/250ML-% infusion (has no administration in time range)  iohexol (OMNIPAQUE) 350 MG/ML injection 100 mL (100 mLs Intravenous Contrast Given 08-23-20 0901)  insulin aspart (novoLOG) injection 5 Units (5  Units Intravenous Given 08-16-20 1032)  dextrose 50 % solution 50 mL (50 mLs Intravenous Given August 16, 2020 1030)  calcium gluconate 1 g/ 50 mL sodium chloride IVPB (0 g Intravenous Stopped 08-16-20 1137)  vancomycin (VANCOREADY) IVPB 2000 mg/400 mL (2,000 mg Intravenous New Bag/Given 08-16-2020 1652)    ED Course  I have reviewed the triage vital signs and the nursing notes.  Pertinent  labs & imaging results that were available during my care of the patient were reviewed by me and considered in my medical decision making (see chart for details).    MDM Rules/Calculators/A&P                          54yo male with history of CAD, CHF, hlpd, HTN, ESRD MWF HD, recent LHC 06/28/2020 left main distal stenosis, EF 20-25% moderate-severe AD, felt to be high risk and was seen in office and referred for direct admission to [facility] where he was found to need no intervention for CAD but did have severe AS and had successful balloon aortic valvuloplasty 11/19, had incidental finding of colonic diverticulosis with fistula formation and central mesenteric abascess and was seen by GI and Geneeral surgery, ID was consulted for lower extremity wounds, who now presents with concern for altered mental status, generalized weakness, confusion which was noted by family when he woke this AM.  DDx on arrival includes CVA, ICH, medications, uremia, hepatic encephalopathy, other metabolic abnormality, sepsis.  No significant hypo or hyperglycemia on arrival. Nursing had concern for possible aphasia and right sided weakness however on my evaluation has equal strength, no signs of aphasia and do not see signs of him being VAN positive/and is out of the Code Stroke window. Given vertical nystagmus on exam and reports of difficulty ambulating did order CTA head and neck which was completed emergently to evaluate for occlusion and consulted Neurology for evaluation for possible CVA.  Calcification of M2 with distal flow, no weakness oraphasia on neurology evaluation.  Will proceed with MRI for further evaluation and check vitamin levels but differential continues to include stroke as well as other etiologies of encephalopathy including toxic/metabolic.  CT ordered given abdominal pain and tenderness on exam does not show significant acute pathology. Does not have focalRUQ tendernessandfeel thickening seen likely secondary  to ascites and cannot rule out colitis. Able to palpate trace pedal pulses bilaterally and doubt arterial disease seen on CT etiology of symptoms.  Given initial presentation of generalized weakness, vertical nystagmus, concern for volume overload with ESRD did not ordered 30cc/kg fluid or empiric abx on arrival.  Did give empiric abx after initial work up, leukocytosis present. Lactic acid elevation thought to be due to possible underlying liver dysfunction and hypoperfusion due to other etiologies such as toxic/metabolic.  Did order fluid and consult CC when repeat lactic acid increasing.    Labs significant for severe hyperkalemia, AG metabolic acidosis, mild elevation tranaminases, lactic acidosis, elevated troponin.  Per report from EMS there was concern for possible GI bleed, however on my rectal exam no melena, hgb stable, and sister denies this as a concern.  For hyperkalemia, given insulin/Ca/dextrose and consulted Nephrology for emergent dialysis.  Consulted Cardiology for elevated troponin, hx of recent admission with CAD, aortic valvuloplasty.  Nephrology consulted to take to emergent dialysis. Neurology will follow for r/o CVA, encephalopathy.  Consulted Family medicine for likely admission to stepdown unit after dialysis and called PCCM as well given increasing lactic acidosis.  Updated family at bedside.   Final Clinical Impression(s) / ED Diagnoses Final diagnoses:  Transaminitis  Encephalopathy  Hyperkalemia  Metabolic acidosis  Lactic acidosis  Elevated troponin    Rx / DC Orders ED Discharge Orders    None       Gareth Morgan, MD 07-27-20 2150

## 2020-07-28 NOTE — Progress Notes (Signed)
Called daughter Marco Arnold. Per patient's father, fell out of bed and wasn't acting right. Patient lives with his father "so [she] doesn't see him that often or every day". Father was concerned for him being "messed up on drugs" but daughter said "he doesn't do drugs like that". Used to smoke MJ but hasn't since last hospitalizations. Does eat MJ edibles, last a couple days ago as far as daughter knows.  Someone came to the house, got patient dressed with the intent to take him to dialysis.  However, as they were getting him out of the house, patient yelled "wake me up".  Was at this time that an ambulance was called.  Daughter provided name and phone number of patient's sister, gave permission for me to contact her as well.   Next called patient's sister, Marco Arnold at [phone], went straight to voicemail. No message left.   [name], MD

## 2020-07-28 NOTE — Progress Notes (Signed)
Drugs given during rapid intubation. 1943: 20mg  Etomidate 1944: 80mg  Rocuronium 1945: 20mg  Rocuronium  Wasted 2mg  of Versed and 100mg  of Fentanyl, Witnessed by [employer]

## 2020-07-28 NOTE — ED Triage Notes (Signed)
Pt arrives via EMS from home, lives with family. Pt is altered and family reports weakness but are poor historians per EMS HD M/W/F. Last treatment Friday11/26/21. Pt complaining of left leg pain and belly pain. Black tarry stools reported by family.   Recent cardiac cath reported by EMS  120/60 HR 70 98% CBG 174

## 2020-07-28 NOTE — Progress Notes (Signed)
Pt from ED .Pt presenting with altered mental status .Pt K=6.8 . Pt did HD 4 hrs. Pt taking off 3L and given back 1 L. Because Pt had BP 84/21mmhg. O2 sat 100% on 6L. Rapid responds called. CBG checked read 92. Pt sent to ICU. Dr. Johnney Ou notified.

## 2020-07-28 NOTE — ED Notes (Signed)
Phlebotomy consulted to draw blood cultures and repeat labs.

## 2020-07-28 NOTE — Progress Notes (Signed)
RT at bedside during CPR with BMV for nearly 55 mins.  Pulses regained for a few minutes before they were again lost.  RT continued with BMV during entire code.  At TOD,  RT removed pt from [place].

## 2020-07-28 NOTE — ED Notes (Signed)
Pt transported to dialysis with nurses and NT assist. Report given to dialysis nurse prior to transport

## 2020-07-28 NOTE — Progress Notes (Signed)
Patient came in as a rapid admit, had labored breathing with periods of apnea and was unresponsive. Rapid intubation was performed at bedside, after which patient went into cardiac arrest with PEA, see code sheet. His sister was brought in at bedside with the [facility] to say her goodbyes then code was stopped and time of death called at 2034-11-26.

## 2020-07-28 NOTE — Consult Note (Signed)
Neurology Consultation  CC: confusion and ? Aphasia   Neuro called for consult by ED MD.   History is obtained from:ED MD, RN, chart  HPI: SPARSH CALLENS is a 54 y.o. male with multiple chronic health conditions including ESRD (HD M W F), CHF with EF of 20%, CAD s/p balloon aortic angioplasty 2 weeks ago at [facility], , secondary hyperparathyroidism of renal origin, HLD, diverticulitis, GIB, severe aortic stenosis, PAD, aortic atherosclerosis, and tobacco abuse. Trusten appears unkempt. He states he lives with his 69 yo father who has a terrible memory.   Otis presented from home today with confusion and ? Aphasia. Family reported "altered" mental status and weakness to EMS but were very poor historians. Last HD 07/23/20. On exam, Fredrik has no aphasia or dysarthria. He is minor delayed on some commands or questions, but is able to answer appropriately. At other times, he acts frustrated and just says "I don't know". He denies ETOH or drug use, although chart says he smokes marijuana. Says he doesn't know when asked about narcotic use and events leading up to ED visit. He is oriented to name, city, state, but not day, date, or month.   His reasoning is intact to answering a complicated question. After coaxing, he participates in most of the exam. He is somewhat restless in the bed and when asked if he is in pain, he says I don't know.   In review of chart and care everywhere, Cashis was in [facility] 2 weeks ago where he had a balloon aortic angioplasty and echo. His cardiologist on chart is Brunetta Jeans. His bruising to the abdomen and right groin are most likely associated with angioplasty and Lovenox injections.    He was also hospitalized 3 weeks ago for GIB.    ROS: A partial ROS was taken throughout exam but limited by pt's mental status and uncooperativeness. Rest per HPI. 2  Past Medical History:  Diagnosis Date  . Arthritis    spine  . Chronic kidney disease    [place] MWF  .  Coronary artery disease   . Dialysis patient [facility]) 2013  . Diverticulitis   . Hyperlipidemia   . Hypertension    dr  Tobie Lords      @ [facility]  med  . Neuromuscular disorder ([place])    nerve pain - tx w/cymbalta  . Neuropathic pain 09/20/2017   Left mid-thoracic     Family History  Problem Relation Age of Onset  . Pancreatic cancer Mother   . Diabetes Father   . Hypertension Father   . Cancer - Lung Paternal Grandfather      Social History:  reports that he has been smoking cigarettes. He has a 10.75 pack-year smoking history. He has never used smokeless tobacco. He reports current alcohol use. He reports current drug use. Drug: Marijuana.   Prior to Admission medications   Medication Sig Start Date End Date Taking? Authorizing Provider  acetaminophen (TYLENOL 8 HOUR) 650 MG CR tablet Take 1 tablet (650 mg total) by mouth every 8 (eight) hours as needed for pain. 03/23/20   Petrucelli, Samantha R, PA-C  amLODipine (NORVASC) 10 MG tablet Take 10 mg by mouth at bedtime.  Patient not taking: Reported on 06/15/2020    [provider]  aspirin EC 81 MG tablet Take 81 mg by mouth daily.    [provider]  atorvastatin (LIPITOR) 80 MG tablet Take 80 mg by mouth daily.  Patient not taking: Reported on  06/15/2020 01/16/19   [provider]  carvedilol (COREG) 3.125 MG tablet Take 3.125 mg by mouth 2 (two) times daily with a meal.  Patient not taking: Reported on 06/15/2020 01/16/19   [provider]  cloNIDine (CATAPRES) 0.1 MG tablet Take 0.2 mg by mouth daily. 07/13/16   [provider]  cyclobenzaprine (FLEXERIL) 10 MG tablet Take 20 mg by mouth 2 (two) times daily as needed for muscle spasms.     [provider]  DULoxetine (CYMBALTA) 60 MG capsule Take 60 mg by mouth every morning.  Patient not taking: Reported on 06/15/2020 01/29/18   [provider]  FOSRENOL 1000 MG chewable tablet Chew 6,000 mg by mouth 3 (three) times daily  with meals.  08/31/17   [provider]  gabapentin (NEURONTIN) 100 MG capsule Take 3 capsules (300 mg total) by mouth at bedtime. Patient not taking: Reported on 06/15/2020 09/20/17   Kathrynn Ducking, MD  hydrOXYzine (ATARAX/VISTARIL) 25 MG tablet Take 25 mg by mouth 2 (two) times daily as needed for anxiety or itching (Sleep).     [provider]  lidocaine (LIDODERM) 5 % Place 1 patch onto the skin daily as needed. Apply patch to area most significant pain once per day.  Remove and discard patch within 12 hours of application. Patient taking differently: Place 1 patch onto the skin daily as needed (Apply patch to area most significant pain once per day.  Remove and discard patch within 12 hours of application.).  03/23/20   Petrucelli, Aldona Bar R, PA-C  MISC NATURAL PRODUCTS EX Apply topically. Patient applies horse liniments as needed for chronic low back pain (contains menthol)    [provider]  multivitamin (RENA-VIT) TABS tablet Take 1 tablet by mouth See admin instructions. Every Monday, Wednesday and Friday    [provider]  nortriptyline (PAMELOR) 25 MG capsule Take 25 mg by mouth every morning.  Patient not taking: Reported on 06/15/2020 01/29/18   [provider]  pantoprazole (PROTONIX) 40 MG tablet Take 40 mg by mouth daily.  12/04/12   [provider]  VENTOLIN HFA 108 (90 Base) MCG/ACT inhaler Inhale 2 puffs into the lungs every 6 (six) hours as needed for wheezing or shortness of breath.  12/21/17   [provider]     Exam: Current vital signs: BP 104/60 (BP Location: Right Arm)   Pulse 78   Temp 98.4 F (36.9 C) (Oral)   Resp (!) 27   SpO2 100%    Physical Exam  Constitutional: Appears well-developed and well-nourished. Obese. Unkempt.  Psych: Affect dull, confused at times. Uncooperative at times.  Eyes: No scleral injection or icterus. HENT: No OP obstrucion Head: Normocephalic.  Cardiovascular: Normal  rate and regular rhythm.  Respiratory: Effort normal, non-labored breathing GI: Soft.  No distension. There is no tenderness.  Skin: WDI. Slight yellowish appearance. He has purple bruising to the abdomen in a few areas and to the right groin without hematoma. His toenails are thickened with callouses to feet. He has scabs to his BU/LE and some small circular areas that appear to have been picked at.   Neuro: Mental Status: Patient is awake. Oriented to person, place.  Patient is unable to give a clear and coherent history. No signs of aphasia or neglect Cranial Nerves: II: His cooperation of visual fields is limited but Probation officer sees no nystagmus. Pupils are equal, round, and reactive to light.   III,IV, VI: Partial participation in [place]. No ptosis  noted.   V: Facial sensation is symmetric to light touch.  VII: Facial movement is symmetric. Able to smile and frown.  VIII: hearing is intact to voice X: Uvula elevates symmetrically XI: Shoulder shrug is symmetric. XII: tongue is midline without atrophy or fasciculations. Tongue with deep fissures with a bloody like coloration.  Motor: Tone is normal. Bulk is normal. 5/5 strength was present in all four extremities. Sensory: Sensation is symmetric to light touch in extremities Deep Tendon Reflexes: 1+ and symmetric in the biceps and patellae.  Plantars: Toes are downgoing bilaterally.  Cerebellar: FNF-pt will touch writer's finger, but not return to nose.    I have reviewed labs in epic and the pertinent results are:  Lab Results  Component Value Date/Time   CHOL 136 11/06/2011 07:35 PM    Results for orders placed or performed during the hospital encounter of 2020/08/19 (from the past 48 hour(s))  POC occult blood, ED     Status: None   Collection Time: 2020-08-19  8:31 AM  Result Value Ref Range   Fecal Occult Bld NEGATIVE NEGATIVE  Type and screen [facility]     Status: None   Collection Time: 08/19/20  8:49  AM  Result Value Ref Range   ABO/RH(D) A POS    Antibody Screen NEG    Sample Expiration      07/29/2020,2359 Performed at [facility], [place] [address], [place], [place] [address]   Ammonia     Status: Abnormal   Collection Time: August 19, 2020  8:49 AM  Result Value Ref Range   Ammonia 45 (H) 9 - 35 umol/L    Comment: Performed at [facility], [place] [address] [place], [place] [address]  Comprehensive metabolic panel     Status: Abnormal   Collection Time: 08/19/20  8:49 AM  Result Value Ref Range   Sodium 134 (L) 135 - 145 mmol/L   Potassium 6.8 (HH) 3.5 - 5.1 mmol/L    Comment: NO VISIBLE HEMOLYSIS CRITICAL RESULT CALLED TO, READ BACK BY AND VERIFIED WITH: Z CAGLE RN 1015 [id number] BY A BENNETT    Chloride 86 (L) 98 - 111 mmol/L   CO2 25 22 - 32 mmol/L   Glucose, Bld 133 (H) 70 - 99 mg/dL    Comment: Glucose reference range applies only to samples taken after fasting for at least 8 hours.   BUN 78 (H) 6 - 20 mg/dL   Creatinine, Ser 8.58 (H) 0.61 - 1.24 mg/dL   Calcium 9.9 8.9 - 10.3 mg/dL   Total Protein 6.7 6.5 - 8.1 g/dL   Albumin 2.1 (L) 3.5 - 5.0 g/dL   AST 79 (H) 15 - 41 U/L   ALT 49 (H) 0 - 44 U/L   Alkaline Phosphatase 236 (H) 38 - 126 U/L   Total Bilirubin 1.5 (H) 0.3 - 1.2 mg/dL   GFR, Estimated 7 (L) >60 mL/min    Comment: (NOTE) Calculated using the CKD-EPI Creatinine Equation (2021)    Anion gap 23 (H) 5 - 15    Comment: Performed at [facility], [place] [address], [place], [place] [address]  Ethanol     Status: None   Collection Time: Aug 19, 2020  8:49 AM  Result Value Ref Range   Alcohol, Ethyl (B) <10 <10 mg/dL    Comment: (NOTE) Lowest detectable limit for serum alcohol is 10 mg/dL.  For medical purposes only. Performed at [facility], [place] [address],  [place], [place] [address]   Lipase, blood     Status: None   Collection Time: 21-Aug-2020  8:49 AM  Result Value Ref Range   Lipase 20 11 - 51 U/L    Comment: Performed at [facility] [address] [place], [place] [address]  Salicylate level     Status: Abnormal   Collection Time: 08-21-2020  8:49 AM  Result Value Ref Range   Salicylate Lvl <0.3 (L) 7.0 - 30.0 mg/dL    Comment: Performed at [facility] [address] [place], [place] [address]  Troponin I (High Sensitivity)     Status: Abnormal   Collection Time: August 21, 2020  8:49 AM  Result Value Ref Range   Troponin I (High Sensitivity) 988 (HH) <18 ng/L    Comment: CRITICAL RESULT CALLED TO, READ BACK BY AND VERIFIED WITH: Z CAGLE RN 1015 [id number] BY A BENNETT (NOTE) Elevated high sensitivity troponin I (hsTnI) values and significant  changes across serial measurements may suggest ACS but many other  chronic and acute conditions are known to elevate hsTnI results.  Refer to the Links section for chest pain algorithms and additional  guidance. Performed at [facility], [place] [address], [place], [place] [address]   Acetaminophen level     Status: Abnormal   Collection Time: 2020-08-21  8:49 AM  Result Value Ref Range   Acetaminophen (Tylenol), Serum <10 (L) 10 - 30 ug/mL    Comment: (NOTE) Therapeutic concentrations vary significantly. A range of 10-30 ug/mL  may be an effective concentration for many patients. However, some  are best treated at concentrations outside of this range. Acetaminophen concentrations >150 ug/mL at 4 hours after ingestion  and >50 ug/mL at 12 hours after ingestion are often associated with  toxic reactions.  Performed at [facility], [place] [address], [place], [place] [address]   CBC with Differential     Status: Abnormal   Collection Time: 08-21-20  8:49 AM  Result Value Ref Range   WBC 19.1 (H) 4.0 - 10.5 K/uL   RBC 3.67 (L) 4.22 - 5.81 MIL/uL   Hemoglobin 9.9 (L) 13.0 - 17.0 g/dL   HCT 31.4 (L) 39 - 52 %   MCV 85.6 80.0 - 100.0 fL   MCH 27.0 26.0 - 34.0 pg   MCHC 31.5 30.0 - 36.0 g/dL   RDW 17.3 (H) 11.5 - 15.5 %   Platelets 107 (L) 150 - 400 K/uL     Comment: REPEATED TO VERIFY PLATELET COUNT CONFIRMED BY SMEAR Immature Platelet Fraction may be clinically indicated, consider ordering this additional test [id number]    nRBC 0.0 0.0 - 0.2 %   Neutrophils Relative % 88 %   Neutro Abs 16.9 (H) 1.7 - 7.7 K/uL   Lymphocytes Relative 3 %   Lymphs Abs 0.5 (L) 0.7 - 4.0 K/uL   Monocytes Relative 6 %   Monocytes Absolute 1.2 (H) 0.1 - 1.0 K/uL   Eosinophils Relative 0 %   Eosinophils Absolute 0.0 0.0 - 0.5 K/uL   Basophils Relative 0 %   Basophils Absolute 0.0 0.0 - 0.1 K/uL   Immature Granulocytes 3 %   Abs Immature Granulocytes 0.53 (H) 0.00 - 0.07 K/uL    Comment: Performed at [facility], [place] [address], [place],  [address]  Protime-INR     Status: Abnormal   Collection Time: Aug 21, 2020  8:49 AM  Result Value Ref Range   Prothrombin Time 17.6 (  H) 11.4 - 15.2 seconds   INR 1.5 (H) 0.8 - 1.2    Comment: (NOTE) INR goal varies based on device and disease states. Performed at [facility], [place] [address], [place], [place] [address]   Lactic acid, plasma     Status: Abnormal   Collection Time: Aug 06, 2020  8:49 AM  Result Value Ref Range   Lactic Acid, Venous 4.3 (HH) 0.5 - 1.9 mmol/L    Comment: CRITICAL RESULT CALLED TO, READ BACK BY AND VERIFIED WITH: Virl Son RN [phone] BY A BENNETT Performed at [facility], [place] [address] [place], [place] [address]   Brain natriuretic peptide     Status: Abnormal   Collection Time: 08/06/20  8:49 AM  Result Value Ref Range   B Natriuretic Peptide >4,500.0 (H) 0.0 - 100.0 pg/mL    Comment: Performed at [facility] [address] [place], [place] [address]    CT Angio Head W or Wo Contrast  Result Date: 08/06/20 CLINICAL DATA:  Vertigo, central. Additional provided: Patient reports vertigo and abdominal pain. EXAM: CT ANGIOGRAPHY HEAD AND NECK TECHNIQUE: Multidetector CT imaging of the head and neck was performed using the standard protocol during  bolus administration of intravenous contrast. Multiplanar CT image reconstructions and MIPs were obtained to evaluate the vascular anatomy. Carotid stenosis measurements (when applicable) are obtained utilizing NASCET criteria, using the distal internal carotid diameter as the denominator. CONTRAST:  195mL OMNIPAQUE IOHEXOL 350 MG/ML SOLN COMPARISON:  Brain MRI 02/18/2015. Report from cervical spine MRI 09/21/2015 (images unavailable). FINDINGS: CT HEAD FINDINGS Brain: Mild cerebral and cerebellar atrophy, advanced for age. Minimal patchy hypoattenuation within the cerebral white matter is nonspecific, but most often secondary to chronic small vessel ischemia. There is no acute intracranial hemorrhage. No demarcated cortical infarct. No extra-axial fluid collection. No evidence of intracranial mass. No midline shift. Vascular: Atherosclerotic calcifications. This includes a calcific focus within the left sylvian fissure which may reflect calcified atherosclerotic plaque or an age-indeterminate calcified embolus. Skull: Normal. Negative for fracture or focal lesion. Sinuses: Mild scattered paranasal sinus mucosal thickening. Orbits: No mass or acute finding. Review of the MIP images confirms the above findings CTA NECK FINDINGS Aortic arch: Standard aortic branching. Atherosclerotic plaque within the visualized aortic arch and proximal major branch vessels of the neck. No hemodynamically significant innominate or proximal subclavian artery stenosis. Right carotid system: CCA and ICA patent within the neck without significant stenosis (50% or greater). Moderate calcified plaque within the carotid bifurcation and proximal ICA. Left carotid system: CCA and ICA patent within the neck without significant stenosis (50% or greater). Moderate calcified plaque within the mid CCA. Moderate soft and calcified plaque within the distal CCA, carotid bifurcation and proximal ICA. Vertebral arteries: Patent within the neck. The  right vertebral artery is dominant. Calcified plaque results in moderate to moderately severe stenosis of the proximal right V1 segment. Nonstenotic calcified plaque at the origin of the left vertebral artery. Skeleton: Cervical spondylosis. Nonspecific 8 mm lucent lesion within the C4 vertebral body. Other neck: No neck mass or cervical lymphadenopathy. Upper chest: No consolidation within the imaged lung apices. Review of the MIP images confirms the above findings CTA HEAD FINDINGS Anterior circulation: The intracranial internal carotid arteries are patent. Calcified plaque within both vessels. Resultant moderate stenosis of the paraclinoid ICAs bilaterally. The M1 middle cerebral arteries are patent. No M2 proximal branch occlusion is identified. There is a calcified focus within a mid M2 left  MCA branch vessel, which may reflect calcified atherosclerotic plaque or an age-indeterminate calcified embolus (series 18, image 28). Finding was discussed by telephone at the time of interpretation on Aug 20, 2020 at 9:45 am with provider Dr. Gregor Hams, who verbally acknowledged these results. Atherosclerotic irregularity of the M2 and more distal MCA branch vessels bilaterally. Most notably, there is a moderate/severe focal stenosis within a mid M2 left MCA branch (series 16, image 22). The anterior cerebral arteries are patent. No intracranial aneurysm is identified. Posterior circulation: The intracranial vertebral arteries are patent. Up to moderate stenosis within the V4 right vertebral artery. There is also moderate stenosis within the V4 left vertebral artery at the vertebrobasilar junction. The basilar artery is patent. The posterior cerebral arteries are patent. Posterior communicating arteries are hypoplastic or absent bilaterally. Venous sinuses: Within the limitations of contrast timing, no convincing thrombus. Anatomic variants: As described. Review of the MIP images confirms the above findings IMPRESSION: CT  head: 1. No evidence of acute intracranial abnormality. 2. Mild cerebral and cerebellar atrophy, advanced for age. 3. Minimal patchy cerebral white matter disease, nonspecific but most often secondary to chronic small vessel ischemia. CTA neck: 1. The bilateral common and internal carotid arteries are patent within the neck without hemodynamically significant stenosis. Moderate plaque within the bilateral carotid systems as described. 2. The vertebral arteries are patent within the neck bilaterally. Calcified plaque results in moderate to moderately severe stenosis of the proximal right V1 segment. CTA head: 1. No intracranial large vessel occlusion. 2. Focus of calcification within a mid M2 left MCA branch vessel which may reflect an age-indeterminate calcified embolus or calcified atherosclerotic plaque. 3. Multifocal intracranial atherosclerotic stenoses, most notably as follows. 4. Moderate stenosis of the paraclinoid ICAs bilaterally. 5. Moderate/severe focal stenosis within a mid M2 left MCA vessel. 6. Moderate stenoses within the V4 vertebral arteries bilaterally. Electronically Signed   By: Kellie Simmering DO   On: 08/20/20 09:59   CT Angio Neck W and/or Wo Contrast  Result Date: 20-Aug-2020 CLINICAL DATA:  Vertigo, central. Additional provided: Patient reports vertigo and abdominal pain. EXAM: CT ANGIOGRAPHY HEAD AND NECK TECHNIQUE: Multidetector CT imaging of the head and neck was performed using the standard protocol during bolus administration of intravenous contrast. Multiplanar CT image reconstructions and MIPs were obtained to evaluate the vascular anatomy. Carotid stenosis measurements (when applicable) are obtained utilizing NASCET criteria, using the distal internal carotid diameter as the denominator. CONTRAST:  125mL OMNIPAQUE IOHEXOL 350 MG/ML SOLN COMPARISON:  Brain MRI 02/18/2015. Report from cervical spine MRI 09/21/2015 (images unavailable). FINDINGS: CT HEAD FINDINGS Brain: Mild cerebral  and cerebellar atrophy, advanced for age. Minimal patchy hypoattenuation within the cerebral white matter is nonspecific, but most often secondary to chronic small vessel ischemia. There is no acute intracranial hemorrhage. No demarcated cortical infarct. No extra-axial fluid collection. No evidence of intracranial mass. No midline shift. Vascular: Atherosclerotic calcifications. This includes a calcific focus within the left sylvian fissure which may reflect calcified atherosclerotic plaque or an age-indeterminate calcified embolus. Skull: Normal. Negative for fracture or focal lesion. Sinuses: Mild scattered paranasal sinus mucosal thickening. Orbits: No mass or acute finding. Review of the MIP images confirms the above findings CTA NECK FINDINGS Aortic arch: Standard aortic branching. Atherosclerotic plaque within the visualized aortic arch and proximal major branch vessels of the neck. No hemodynamically significant innominate or proximal subclavian artery stenosis. Right carotid system: CCA and ICA patent within the neck without significant stenosis (50% or greater). Moderate calcified plaque within the  carotid bifurcation and proximal ICA. Left carotid system: CCA and ICA patent within the neck without significant stenosis (50% or greater). Moderate calcified plaque within the mid CCA. Moderate soft and calcified plaque within the distal CCA, carotid bifurcation and proximal ICA. Vertebral arteries: Patent within the neck. The right vertebral artery is dominant. Calcified plaque results in moderate to moderately severe stenosis of the proximal right V1 segment. Nonstenotic calcified plaque at the origin of the left vertebral artery. Skeleton: Cervical spondylosis. Nonspecific 8 mm lucent lesion within the C4 vertebral body. Other neck: No neck mass or cervical lymphadenopathy. Upper chest: No consolidation within the imaged lung apices. Review of the MIP images confirms the above findings CTA HEAD FINDINGS  Anterior circulation: The intracranial internal carotid arteries are patent. Calcified plaque within both vessels. Resultant moderate stenosis of the paraclinoid ICAs bilaterally. The M1 middle cerebral arteries are patent. No M2 proximal branch occlusion is identified. There is a calcified focus within a mid M2 left MCA branch vessel, which may reflect calcified atherosclerotic plaque or an age-indeterminate calcified embolus (series 18, image 28). Finding was discussed by telephone at the time of interpretation on Aug 14, 2020 at 9:45 am with provider Dr. Gregor Hams, who verbally acknowledged these results. Atherosclerotic irregularity of the M2 and more distal MCA branch vessels bilaterally. Most notably, there is a moderate/severe focal stenosis within a mid M2 left MCA branch (series 16, image 22). The anterior cerebral arteries are patent. No intracranial aneurysm is identified. Posterior circulation: The intracranial vertebral arteries are patent. Up to moderate stenosis within the V4 right vertebral artery. There is also moderate stenosis within the V4 left vertebral artery at the vertebrobasilar junction. The basilar artery is patent. The posterior cerebral arteries are patent. Posterior communicating arteries are hypoplastic or absent bilaterally. Venous sinuses: Within the limitations of contrast timing, no convincing thrombus. Anatomic variants: As described. Review of the MIP images confirms the above findings IMPRESSION: CT head: 1. No evidence of acute intracranial abnormality. 2. Mild cerebral and cerebellar atrophy, advanced for age. 3. Minimal patchy cerebral white matter disease, nonspecific but most often secondary to chronic small vessel ischemia. CTA neck: 1. The bilateral common and internal carotid arteries are patent within the neck without hemodynamically significant stenosis. Moderate plaque within the bilateral carotid systems as described. 2. The vertebral arteries are patent within the  neck bilaterally. Calcified plaque results in moderate to moderately severe stenosis of the proximal right V1 segment. CTA head: 1. No intracranial large vessel occlusion. 2. Focus of calcification within a mid M2 left MCA branch vessel which may reflect an age-indeterminate calcified embolus or calcified atherosclerotic plaque. 3. Multifocal intracranial atherosclerotic stenoses, most notably as follows. 4. Moderate stenosis of the paraclinoid ICAs bilaterally. 5. Moderate/severe focal stenosis within a mid M2 left MCA vessel. 6. Moderate stenoses within the V4 vertebral arteries bilaterally. Electronically Signed   By: Kellie Simmering DO   On: 08/14/20 09:59   CT ABDOMEN PELVIS W CONTRAST  Addendum Date: August 14, 2020   ADDENDUM REPORT: Aug 14, 2020 10:01 ADDENDUM: No significant lymph node enlargement in the abdomen or pelvis. Small lymph nodes at the gastrohepatic ligament are similar to the exam on 03/23/2020. Electronically Signed   By: Markus Daft M.D.   On: 2020/08/14 10:01   Result Date: August 14, 2020 CLINICAL DATA:  54 year old dialysis patient with abdominal pain and vertigo. EXAM: CT ABDOMEN AND PELVIS WITH CONTRAST TECHNIQUE: Multidetector CT imaging of the abdomen and pelvis was performed using the standard protocol following bolus administration of intravenous  contrast. CONTRAST:  138mL OMNIPAQUE IOHEXOL 350 MG/ML SOLN COMPARISON:  CT abdomen and pelvis 03/23/2020 and chest CT 06/30/2020 FINDINGS: Lower chest: New irregular pleural-based densities at the lung bases likely associated with atelectasis based interval development since 06/30/2020. No large pleural effusions. Heart is enlarged and stable. Again noted is a small amount of pericardial fluid. Hepatobiliary: There may be mild gallbladder wall thickening without significant distention. Small amount of perihepatic ascites is new. No discrete liver lesion. No biliary dilatation. Main portal venous system is patent. Pancreas: Unremarkable. No  pancreatic ductal dilatation or surrounding inflammatory changes. Spleen: Small amount of ascites around the spleen. Spleen is normal for size. Adrenals/Urinary Tract: Stable appearance of the adrenal glands. Chronic atrophy of both kidneys with small bilateral renal cysts. No hydronephrosis. Extensive high-density material in the urinary bladder is new from the prior pelvic CT. No bladder distension. Stomach/Bowel: Rectum is distended with oral contrast. Normal appearance of the stomach. There is no evidence for bowel obstruction. High-density contrast slightly limits evaluation of the colon. Difficult to exclude mild pericolonic edema involving the transverse colon. There is also concern for focal wall thickening and mild pericolonic edema in the sigmoid colon, particularly on sequence 5, image 81. Vascular/Lymphatic: Distal abdominal aorta is heavily calcified. Concern for bilateral common iliac artery stenosis due to extensive calcified plaque. Large calcified plaque in the right common femoral artery which is likely causing hemodynamically significant stenosis. Visceral arteries are heavily calcified. Reproductive: High-density material involving the seminal vesicles may be related to the calcifications. Prostate is normal for size. Other: Small amount of ascites in the right lower quadrant. Small amount of ascites around the liver and spleen. Small amount of fat ascites in the anterior pelvis. Negative for free air. Musculoskeletal: No acute abnormality. Degenerative facet disease in lumbar spine. Diffuse subcutaneous edema. IMPRESSION: 1. Small amount of ascites in the abdomen and pelvis. This is new since July 2021. There may be mild gallbladder wall thickening which could be related to the ascites. If this is an area of clinical concern, recommend right upper quadrant ultrasound to evaluate the gallbladder. 2. Concern for areas of wall thickening involving the sigmoid colon and possibly the transverse  colon. These areas are difficult to evaluate due to the high density contrast in the colon. Areas of colitis cannot be excluded. 3. New high-density material within the urinary bladder. This is new since 03/23/2020. This high-density material appears to be in the urinary bladder wall based on the sagittal reformats and suggestive for dystrophic wall calcifications. Findings may be associated with the end-stage renal disease and recommend correlation with urinalysis. 4. Chronic cardiomegaly. 5. Chronic renal atrophy with small renal cysts. Findings are compatible with end-stage renal disease. 6. Severe atherosclerotic disease involving the distal abdominal aorta and common iliac arteries. Suspect hemodynamically significant stenosis involving bilateral common iliac arteries and the right common femoral artery. Electronically Signed: By: Markus Daft M.D. On: 08/05/2020 09:50   DG Chest Portable 1 View  Result Date: 08/05/20 CLINICAL DATA:  Altered mental status. EXAM: PORTABLE CHEST 1 VIEW COMPARISON:  June 27, 2020. FINDINGS: Stable cardiomegaly. Both lungs are clear. The visualized skeletal structures are unremarkable. IMPRESSION: No active disease. Electronically Signed   By: Marijo Conception M.D.   On: August 05, 2020 10:08     I have reviewed the images obtained: Dr. Meredeth Ide.   Primary Diagnosis:  1. Encephalopathy-infectious vs metabolic.   Secondary Diagnosis: 1. ESRD 2. CAD 3. CHF 4. Secondary hyperparathyroidism of renal origin.  5. Atherosclerosis.  6. Severe aortic stenosis. 7. Pulmonary HTN.  8. Elevated LFTs(seems to be chronic) 9. Elevated LA and WBCC   Impression: 54 yo male with multiple chronic medical issues that may contribute to his encephalopathy. His CTA showed M2 calcification but no occlusions or basilar thrombus. He has lab evidence of infection. His Tylenol, ETOH, ASA level are neg. Pending tox screen and Vit levels. His sx that may have been seen by family seem to be  resolved. His neuro exam reveals no focal deficits. Doubt CVA. Likely encephalopathy, infectious vs metabolic.   Recommendations: 1. Doubt acute CVA, r/o basilar thromus, but given his presenting sx and his encephalopathy, will complete workup with MRI brain without contrast.  2. Check Vit B12, Folate and Thiamine levels. 3. Start MVI and Thiamine 250 po qd. Place on 100 on d/c.  4. If MRI brain is neg, neuro will sign off. Call with questions.   Clance Boll, NP Pt seen with Dr. Meredeth Ide who will edit note and plan as indicated.   NEUROHOSPITALIST ADDENDUM Performed a face to face diagnostic evaluation.   I have reviewed the contents of history and physical exam as documented by PA/ARNP/Resident and agree with above documentation.  I have discussed and formulated the above plan as documented. Edits to the note have been made as needed.  Impression and key exam findings: 45M who we saw for relatively acute onset of AMS and nystagmus. Some initial concerns that he may have aphasia out of proportion to encephalopathy but he is able to comprehend, express, repeat and writes down his name with his finger. He does have poor attention and keeps going back to sleep which makes it somewhat difficult to do an accurate and full assessment. He has no other focal deficit. CT Angio is negative for an LVO except for a focal short segment calcification in the Left M2 MCA superior division with good flow distally. Plan: Will get MRI Brain without contrast, suspect that this is toxic metabolic encephalopathy. Will also get additional vitamin levels due to tongue fissuring and AMS to rule out easily reversible causes of encephalopathy. No further workup if above is non revealing.  Donnetta Simpers, MD [facility] [phone]   If 7pm to 7am, please call on call as listed on AMION.

## 2020-07-28 NOTE — Significant Event (Addendum)
Rapid Response Event Note   Reason for Call :  Hypotension.   Initial Focused Assessment:  Pt lying in bed. Eyes are closed, pt moans to painful stimuli. Lung sounds are clear, diminished. Pt having agonal respirations and periods of apnea. PERRLA, 70mm.   VS: T 101.1, BP 83/57, HR 96, RR 12, SpO2 100% on 6LNC CBG: 92  Interventions:  -500 cc bolus -Emergent transfer to ICU  Plan of Care:  -transfer to ICU  Event Summary:  MD Notified: Dr. Genevive Bi Call Time: 1858 Arrival Time: 1905 End Time: 2030  Casimer Bilis, RN

## 2020-07-28 NOTE — Code Documentation (Signed)
Patient lost pulse approximately 10 minutes after intubation in a PEA arrest. CPR started immediately. Code lasted for 42 minutes. During code, 14 amps of epi, 2 amps of bicarb, 10 U of insulin and an amp of D50, and 1L fluid given. Patients family notified and sister to bedside. Unlikelihood of successfully regaining pulse was expressed to family toward end of code and family asked to stop CPR. Time of death was 20:36.

## 2020-07-28 NOTE — Consult Note (Addendum)
NAME:  Marco Arnold, MRN:  [id number], DOB:  [birth year], LOS: 0 ADMISSION DATE:  2020-08-16, CONSULTATION DATE:  Aug 16, 2020 REFERRING MD:  Marco Savannah, MD CHIEF COMPLAINT:  encephalopathy  Brief History   54yo male with ESRD on HD MWF, CAD s/p LHC 11/20 with stent placed and LHC 11/21 w/o intervention, HFrEF (EF 20-25%), severe AS s/p valvuloplasty 11/19, and recent incidental dx of colonic diverticulosis with mesenteric abscess and fistula presenting from home with AMS.   History of present illness   Marco Arnold is a 54yo male with ESRD on HD MWF, CAD s/p LHC 11/20 with stent placed and LHC 11/21 w/left main disease w/o intervention, HFrEF (EF 20-25%), severe AS s/p valvuloplasty 11/19, lower extremity wounds, and recent incidental dx of colonic diverticulosis with mesenteric abscess and fistula presenting from home with AMS.  History obtained from chart review due to patient's altered mental status. Patient apparently was normal yesterday evening when seen by his daughter around 7pm. This morning his father went to wake him and patient appeared confused. When they attempted to bring him to dialysis he had syncopal episode. There is some history of previous marijuana use and his father was concerned he had taken pain pills. Additionally there was some concern for melanotic stools.   In the ER patient was disoriented and found to have WBC 19, LA 4.3 > 6.2; troponin of 988 > 1,574, BNP >4500, and K of 6.7 and was taken for emergent dialysis. He was started on vanc and zosyn. FOBT was negative. Blood cultures ordered. Salicylate & acetaminophen were normal. LFTs mildly elevated with Alk phos of 236 and T. Bili 1.5.    Neurology was consulted for concern of stroke. At this time patient was able to answer questions appropriately and showed no focal deficits and was orientedx2. CTA without signs of acute CVA.   Cardiology was also consulted. Patient has recently undergone cardiac cath due to demand  ischemia 11/1 which showed 80% stenosed left main and echo with severe aortic stenosis with moderate mitral and tricuspid regurge. He was felt to be too high risk for surgery or PCI. He went to [facility] in [place] for a second opinion with similar conclusions. He did have balloon aortic valvuloplasty 11/19 with somewhat improved flow.    During this work-up he was incidentally found to have central mesenteric abscess with fistula formation and underwent conservative treatment with consults to ID, generall surgery and GI and recommended outpatient follow-up and was discharged on augmentin. ID was also consulted for chronic worsening LE wounds. Unable to see notes for follow-up.   On seeing patient during HD he is answering some yes or no questions. He endorses chest pain, denies abdominal pain. He is unable to answer orientation questions and is mumbling, difficult to understand.   Past Medical History  HFrEF (EF 20-25%) Severe AS s/p valvuloplasty 11/19 CAD s/p LHC 06/28/2020, not eligible for PCI, rec medical management only  Mesenteric abscess and fistula  Diverticulitis  ESRD on HD MWF Neuropathy Arthritis  Anemia of chronic disease HTN  Significant Hospital Events   Admitted 2020/08/16 PCCM consulted 08/16/20  Consults:  Cardiology Neurology Nephrology   Procedures:  na  Significant Diagnostic Tests:  CTA head and neck 11/29: no acte intracranial findings, mid M2 left MCA age-indeterminate calcified embolus vs. Plaque; moderate ICA stenosis bilaterally, moderate/severe stenosis of left M2 MCA, moderate stenosis of V4 arteries bilaterally  CT Abdomen/Pelvis 11/29: New moderate ascites, possible areas of colitis and wall thickening  sigmoid and transverse colon, possible dystrophic bladder wall calcifications possibly secondary to ESRD, cardiomegaly  Micro Data:  Blood culture 11/29  Antimicrobials:  vanc 11/29 >> Zosyn 11/29 >>  Interim history/subjective:  See HPI  Objective     Blood pressure 105/64, pulse 85, temperature 98.4 F (36.9 C), temperature source Oral, resp. rate 19, weight 97 kg, SpO2 98 %.       No intake or output data in the 24 hours ending 2020-08-16 1353 Filed Weights   August 16, 2020 1100  Weight: 97 kg    Examination: General: acute distress, supine in bed, acutely ill appearing  HENT: [place]/AT, no injection or icterus  Lungs: CTA, no wheezing rales or rhonchi, on 6L  Cardiovascular: RRR, distant heart sounds; 1+ pitting edema, +pedal pulses   Abdomen: soft, NTTP, non-distended  Extremities: moving all extremities Neuro: orientedx0, following some commands, answering some yes or no questions  Skin: chronic appearing UE and LE b/l excoriations; bruising over right groin with previous catheter site c/d/i no erythema   Resolved Hospital Problem list     Assessment & Plan:   Acute Encephalopathy  Hyperammonemia AG Lactic Acidosis  Symptoms seem most consistent with poor output heart failure although differential also includes acute ACS, uremia secondary to needing HD although last HD on schedule 11/26, acute GI bleed, abdominal infection with bacteremia, and 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 CT without signs of acute mesenteric ischemia, does show some colitis. Nephrology, neurology and cardiology have been consulted as well. There is some questions of recreational drug use.  - undergoing emergent HD, will follow-up after HD to reassess patient. At risk for decompensation with unclear etiology of acute illness and may require ICU transfer.  - repeat CBC, LA, BMP, Mg, Phos after HD  - f/u MRI  - cont. Vanc/zosyn - blood cultures pending  - UDS, Urine culture pending, unclear if he makes any urine  - recommend repeat echo  Acute Hypoxemic Respiratory Failure Hx of COPD Possibly secondary to volume overload, does have pitting edema but no congestion on CXR vs. COPD exacerbation. Currently  protecting airway, GCS 14. - cont. [place] for O2 >92% - DVT US, consider CTA chest if no improvement s/p HD  - ABG, duonebs  ESRD on HD  Hyperkalemia Last HD 11/26. S/p calcium gluconate - repeat BMP after HD   NSTEMI Acute on Chronic Systolic HF with EF 29-79%  Severe AS s/p valvuloplasty 11/19 Troponin 892>1194. BNP >4500. Cardiology consulted - discussed with cardiology, they recommend medical management only and does not qualify for invasive treatment, plan to possibly start heparin gtt.  - repeat echo - hold bp medications  - f/u cardiology recommendations  Acute on Chronic Anemia Recent history of [facility] admission 11/9, naproxen was stopped at this time, d/c hgb was 11.4, today 9.9. FOBT negative.  - repeat CBC after HD, possibly hemodilution due to volume.   Transaminitis Ascites Likely secondary to poor output heart failure with ascites and volume overload requiring HD.  - trend LFTs   Best practice (evaluated daily)   Diet: NPO, may need SLP eval Pain/Anxiety/Delirium protocol (if indicated): na VAP protocol (if indicated): na DVT prophylaxis: heparin GI prophylaxis: protonix 40 mg IV Glucose control: na Mobility: bed rest Family Communication: will call daughter Code Status: full  Disposition: progressive care   Labs   CBC: Recent Labs  Lab 08/16/2020 0849 08-16-2020 0901  WBC 19.1*  --   NEUTROABS 16.9*  --  HGB 9.9* 10.2*  HCT 31.4* 30.0*  MCV 85.6  --   PLT 107*  --     Basic Metabolic Panel: Recent Labs  Lab 08/16/2020 0849 2020/08/16 0901  NA 134* 130*  K 6.8* 6.7*  CL 86* 93*  CO2 25  --   GLUCOSE 133* 130*  BUN 78* 81*  CREATININE 8.58* 8.80*  CALCIUM 9.9  --    GFR: Estimated Creatinine Clearance: 11.6 mL/min (A) (by C-G formula based on SCr of 8.8 mg/dL (H)). Recent Labs  Lab Aug 16, 2020 0849 08-16-2020 1143  WBC 19.1*  --   LATICACIDVEN 4.3* 6.2*    Liver Function Tests: Recent Labs  Lab 08/16/20 0849  AST 79*  ALT 49*  ALKPHOS  236*  BILITOT 1.5*  PROT 6.7  ALBUMIN 2.1*   Recent Labs  Lab Aug 16, 2020 0849  LIPASE 20   Recent Labs  Lab 08-16-2020 0849  AMMONIA 45*    ABG    Component Value Date/Time   PHART 7.402 06/28/2020 1547   PCO2ART 43.3 06/28/2020 1547   PO2ART 112 (H) 06/28/2020 1547   HCO3 27.0 06/28/2020 1547   TCO2 27 Aug 16, 2020 0901   O2SAT 98.0 06/28/2020 1547     Coagulation Profile: Recent Labs  Lab 08/16/2020 0849  INR 1.5*    Cardiac Enzymes: No results for input(s): CKTOTAL, CKMB, CKMBINDEX, TROPONINI in the last 168 hours.  HbA1C: Hgb A1c MFr Bld  Date/Time Value Ref Range Status  06/30/2020 02:26 AM 6.1 (H) 4.8 - 5.6 % Final    Comment:    (NOTE) Pre diabetes:          5.7%-6.4%  Diabetes:              >6.4%  Glycemic control for   <7.0% adults with diabetes   11/06/2011 07:27 PM 5.2 <5.7 % Final    Comment:    (NOTE)                                                                       According to the ADA Clinical Practice Recommendations for 2011, when HbA1c is used as a screening test:  >=6.5%   Diagnostic of Diabetes Mellitus           (if abnormal result is confirmed) 5.7-6.4%   Increased risk of developing Diabetes Mellitus References:Diagnosis and Classification of Diabetes Mellitus,Diabetes WUXL,2440,10(UVOZD 1):S62-S69 and Standards of Medical Care in         Diabetes - 2011,Diabetes GUYQ,0347,42 (Suppl 1):S11-S61.    CBG: No results for input(s): GLUCAP in the last 168 hours.  Review of Systems:   Unable to obtain ROS due to altered mental status  Past Medical History  He,  has a past medical history of Arthritis, Chronic kidney disease, Coronary artery disease, Dialysis patient ([place]) (2013), Diverticulitis, Hyperlipidemia, Hypertension, Neuromuscular disorder ([place]), and Neuropathic pain (09/20/2017).   Surgical History    Past Surgical History:  Procedure Laterality Date  . AV FISTULA PLACEMENT  11/10/2011   Procedure: ARTERIOVENOUS (AV)  FISTULA CREATION;  Surgeon: Mal Misty, MD;  Location: [place];  Service: Vascular;  Laterality: Left;  Ultrasound guided  . CARDIAC CATHETERIZATION  09/19/2018   On stent study at [facility]  . COLONOSCOPY    .  INSERTION OF DIALYSIS CATHETER  02/09/2012   Procedure: INSERTION OF DIALYSIS CATHETER;  Surgeon: Pryor Ochoa, MD;  Location[facility] OR;  Service: Vascular;  Laterality: N/A;  . MASS EXCISION Right 09/07/2018   Procedure: EXCISION MASS RIGHT THUMB;  Surgeon: Dairl Ponder, MD;  Location: [facility] OR;  Service: Orthopedics;  Laterality: Right;  OR AXILLARY BLOCK  . REVISON OF ARTERIOVENOUS FISTULA Left 04/08/2019   Procedure: REVISION PLICATION OF LEFT ARM ARTERIOVENOUS FISTULA;  Surgeon: Nada Libman, MD;  Location: [facility] OR;  Service: Vascular;  Laterality: Left;  . RIGHT/LEFT HEART CATH AND CORONARY ANGIOGRAPHY N/A 06/28/2020   Procedure: RIGHT/LEFT HEART CATH AND CORONARY ANGIOGRAPHY;  Surgeon: Runell Gess, MD;  Location: [facility] INVASIVE CV LAB;  Service: Cardiovascular;  Laterality: N/A;  . SHUNTOGRAM Left 06/03/2012   Procedure: Betsey Amen;  Surgeon: Fransisco Hertz, MD;  Location: [facility] CATH LAB;  Service: Cardiovascular;  Laterality: Left;  . SHUNTOGRAM Left 01/02/2013   Procedure: SHUNTOGRAM;  Surgeon: Fransisco Hertz, MD;  Location: [facility] CATH LAB;  Service: Cardiovascular;  Laterality: Left;  . TEE WITHOUT CARDIOVERSION N/A 07/01/2020   Procedure: TRANSESOPHAGEAL ECHOCARDIOGRAM (TEE);  Surgeon: Laurey Morale, MD;  Location: [facility] ENDOSCOPY;  Service: Cardiovascular;  Laterality: N/A;     Social History   reports that he has been smoking cigarettes. He has a 10.75 pack-year smoking history. He has never used smokeless tobacco. He reports current alcohol use. He reports current drug use. Drug: Marijuana.   Family History   His family history includes Cancer - Lung in his paternal grandfather; Diabetes in his father; Hypertension in his father; Pancreatic cancer in his mother.   Allergies Allergies   Allergen Reactions  . Ace Inhibitors Cough and Shortness Of Breath    Other reaction(s): Breathing Problems Unable to breath Unable to breath  . Lisinopril Shortness Of Breath    Unable to breath     Home Medications  Prior to Admission medications   Medication Sig Start Date End Date Taking? Authorizing Provider  acetaminophen (TYLENOL 8 HOUR) 650 MG CR tablet Take 1 tablet (650 mg total) by mouth every 8 (eight) hours as needed for pain. 03/23/20   Petrucelli, Samantha R, PA-C  amLODipine (NORVASC) 10 MG tablet Take 10 mg by mouth at bedtime.  Patient not taking: Reported on 06/15/2020    [provider]  aspirin EC 81 MG tablet Take 81 mg by mouth daily.    [provider]  atorvastatin (LIPITOR) 80 MG tablet Take 80 mg by mouth daily.  Patient not taking: Reported on 06/15/2020 01/16/19   [provider]  carvedilol (COREG) 3.125 MG tablet Take 3.125 mg by mouth 2 (two) times daily with a meal.  Patient not taking: Reported on 06/15/2020 01/16/19   [provider]  cloNIDine (CATAPRES) 0.1 MG tablet Take 0.2 mg by mouth daily. 07/13/16   [provider]  cyclobenzaprine (FLEXERIL) 10 MG tablet Take 20 mg by mouth 2 (two) times daily as needed for muscle spasms.     [provider]  DULoxetine (CYMBALTA) 60 MG capsule Take 60 mg by mouth every morning.  Patient not taking: Reported on 06/15/2020 01/29/18   [provider]  FOSRENOL 1000 MG chewable tablet Chew 6,000 mg by mouth 3 (three) times daily with meals.  08/31/17   [provider]  gabapentin (NEURONTIN) 100 MG capsule Take 3 capsules (300 mg total) by mouth at bedtime. Patient not taking: Reported on 06/15/2020 09/20/17   Anne Hahn,  Elon Alas, MD  hydrOXYzine (ATARAX/VISTARIL) 25 MG tablet Take 25 mg by mouth 2 (two) times daily as needed for anxiety or itching (Sleep).     [provider]  lidocaine (LIDODERM) 5 % Place 1 patch onto the skin daily as  needed. Apply patch to area most significant pain once per day.  Remove and discard patch within 12 hours of application. Patient taking differently: Place 1 patch onto the skin daily as needed (Apply patch to area most significant pain once per day.  Remove and discard patch within 12 hours of application.).  03/23/20   Petrucelli, Aldona Bar R, PA-C  MISC NATURAL PRODUCTS EX Apply topically. Patient applies horse liniments as needed for chronic low back pain (contains menthol)    [provider]  multivitamin (RENA-VIT) TABS tablet Take 1 tablet by mouth See admin instructions. Every Monday, Wednesday and Friday    [provider]  nortriptyline (PAMELOR) 25 MG capsule Take 25 mg by mouth every morning.  Patient not taking: Reported on 06/15/2020 01/29/18   [provider]  pantoprazole (PROTONIX) 40 MG tablet Take 40 mg by mouth daily.  12/04/12   [provider]  VENTOLIN HFA 108 (90 Base) MCG/ACT inhaler Inhale 2 puffs into the lungs every 6 (six) hours as needed for wheezing or shortness of breath.  12/21/17   [provider]     Marty Heck, DO 07-30-2020, 1:53 PM Pager: [phone]

## 2020-07-28 DEATH — deceased

## 2020-07-29 LAB — CULTURE, BLOOD (ROUTINE X 2)

## 2020-07-29 LAB — VITAMIN B1: Vitamin B1 (Thiamine): 178.7 nmol/L (ref 66.5–200.0)

## 2020-08-02 LAB — FUNGAL ORGANISM REFLEX

## 2020-08-02 LAB — FUNGUS CULTURE WITH STAIN

## 2020-08-02 LAB — FUNGUS CULTURE RESULT

## 2020-08-14 LAB — ACID FAST CULTURE WITH REFLEXED SENSITIVITIES (MYCOBACTERIA): Acid Fast Culture: NEGATIVE

## 2020-10-30 IMAGING — CR DG CHEST 2V
2 series · 2 of 2 positions shown · non-contrast
Comparison: Chest x-ray 05/26/2019, 02/09/2012.

CLINICAL DATA: Abdominal pain.  Right flank pain.

EXAM:
CHEST - 2 VIEW

[chest pa]
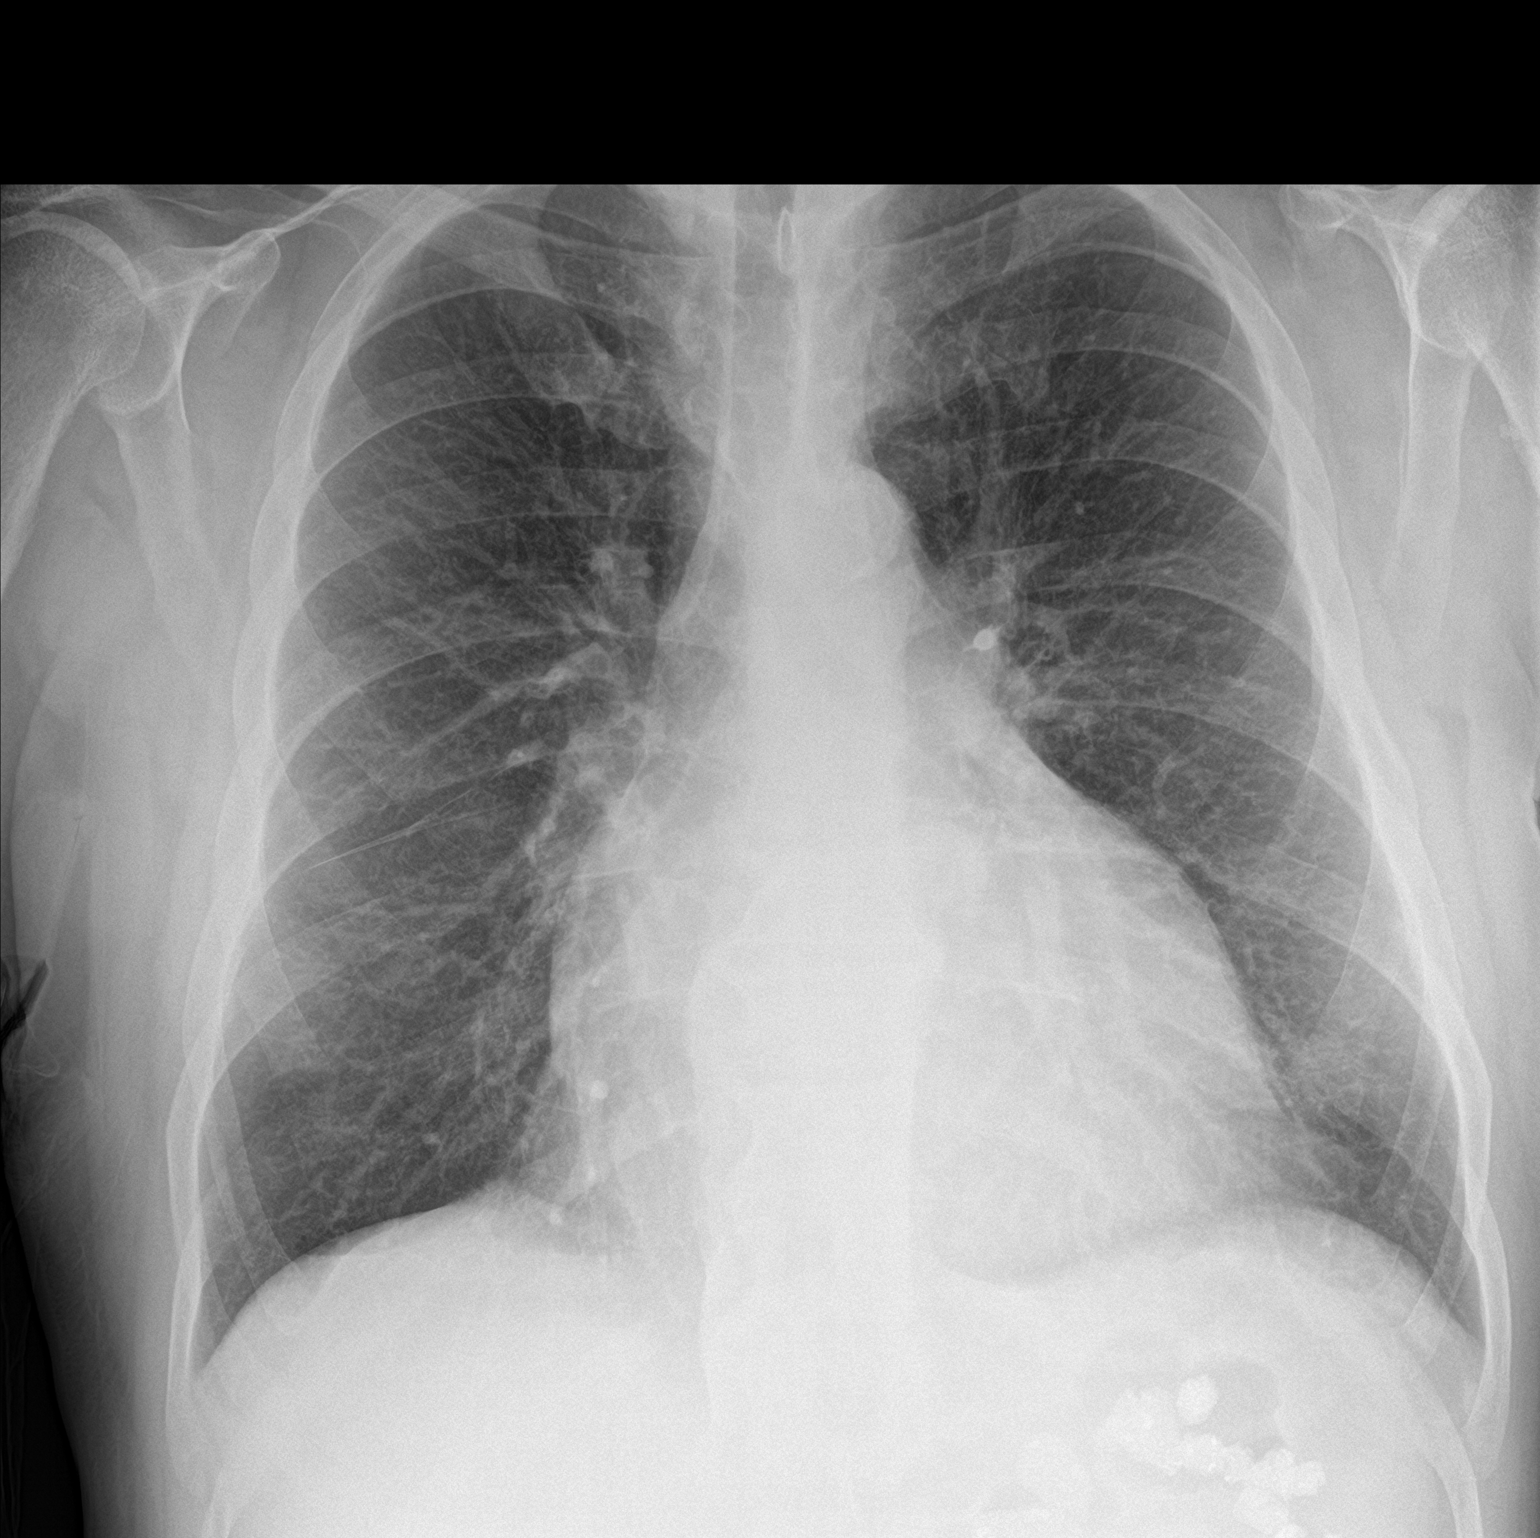

[chest lat]
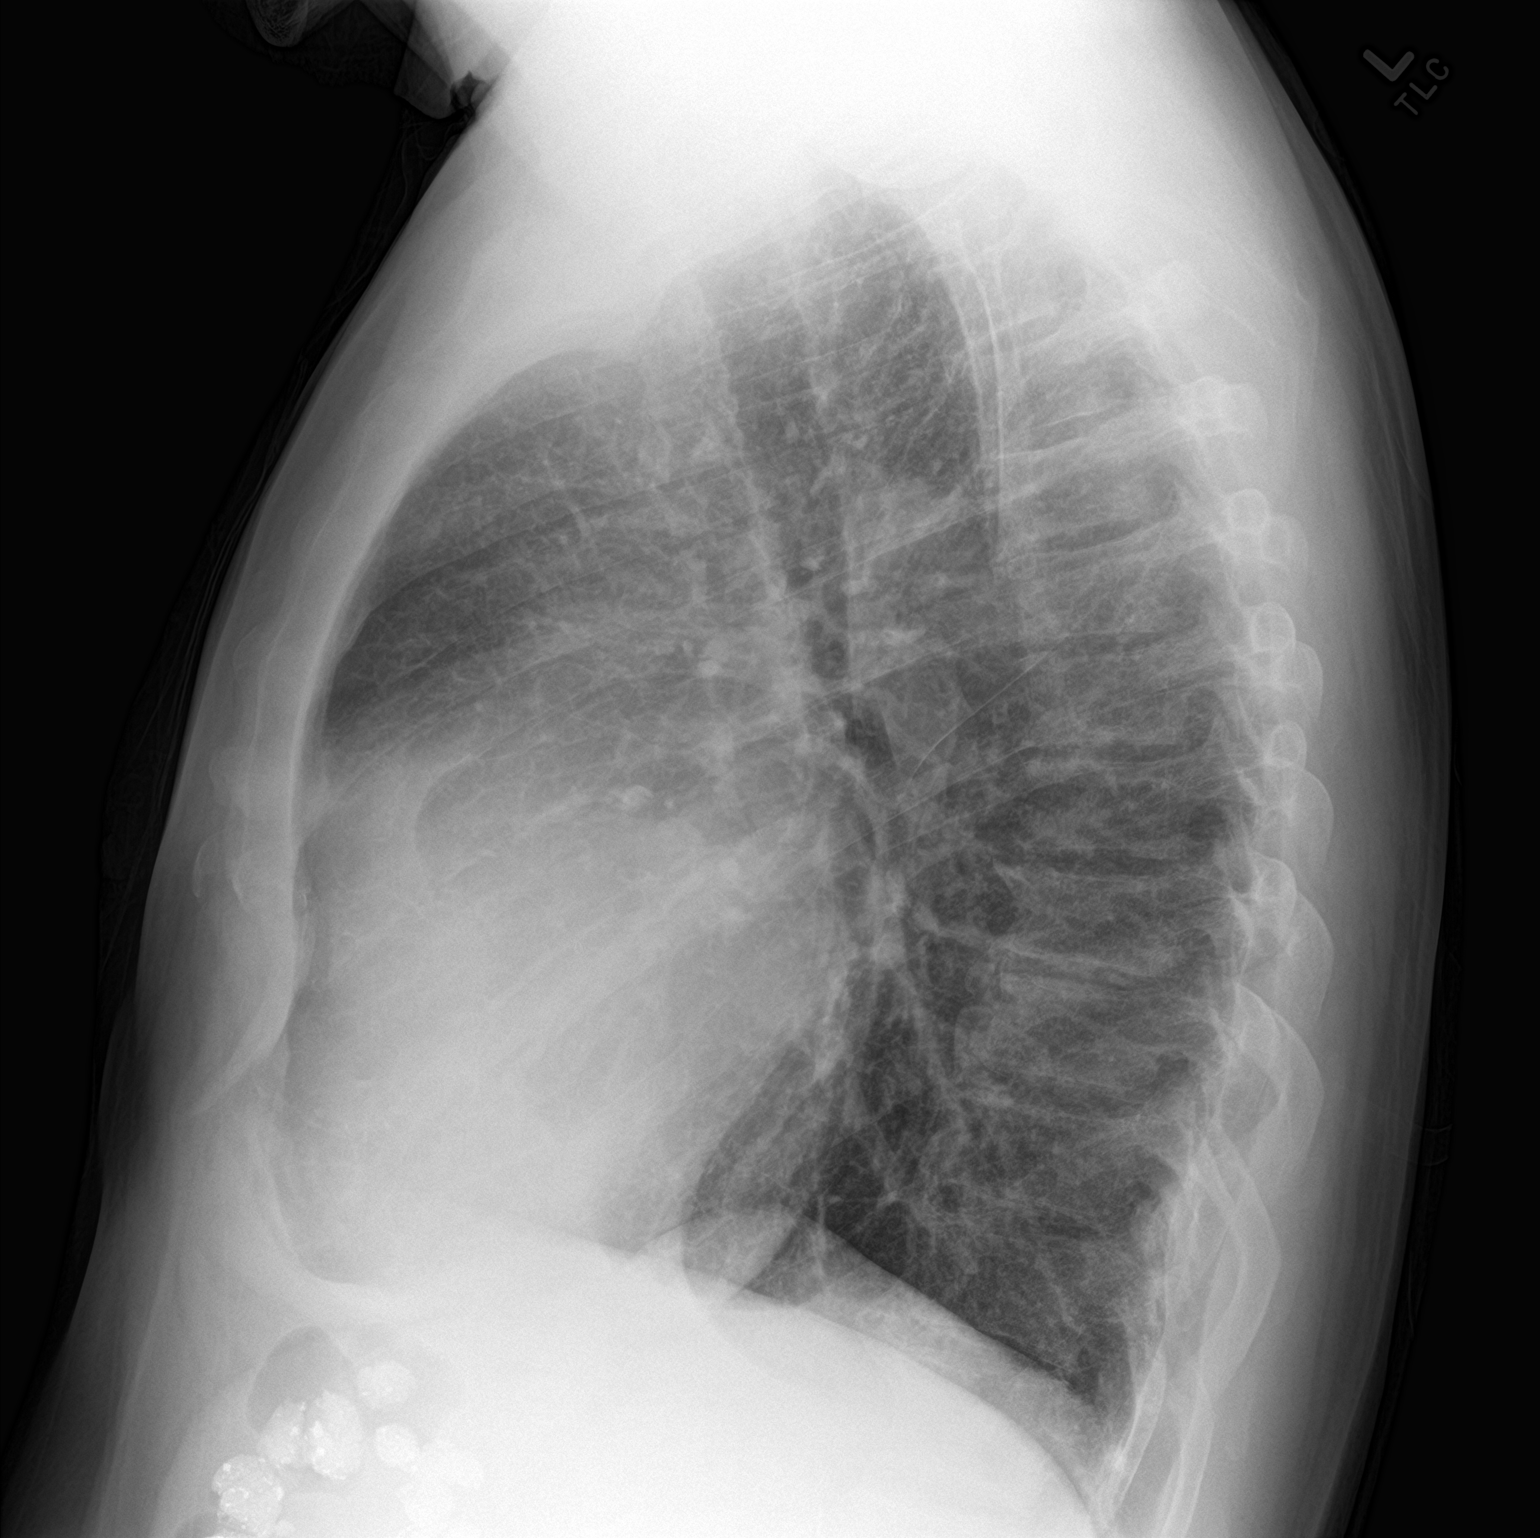

[2 of 2 positions shown; findings below may reference images not displayed]

FINDINGS: Stable cardiomegaly. No focal infiltrate. Stable pleural thickening
noted anteriorly. No pleural effusion or pneumothorax. Degenerative
change thoracic spine. Barium noted in the colon. Degenerative
change thoracic spine. No acute bony abnormality identified.
IMPRESSION: 1.  Stable cardiomegaly.

2.  No pulmonary disease.

## 2021-02-09 IMAGING — DX DG FOOT 2V*L*
2 series · 3 of 3 positions shown · non-contrast
Comparison: None.

CLINICAL DATA: Bilateral foot pain and swelling. No reported
injury.

EXAM:
LEFT FOOT - 2 VIEW

[Series 1: foot · 0.14mm/px · 2 of 2 slices shown]
[im 1/2]
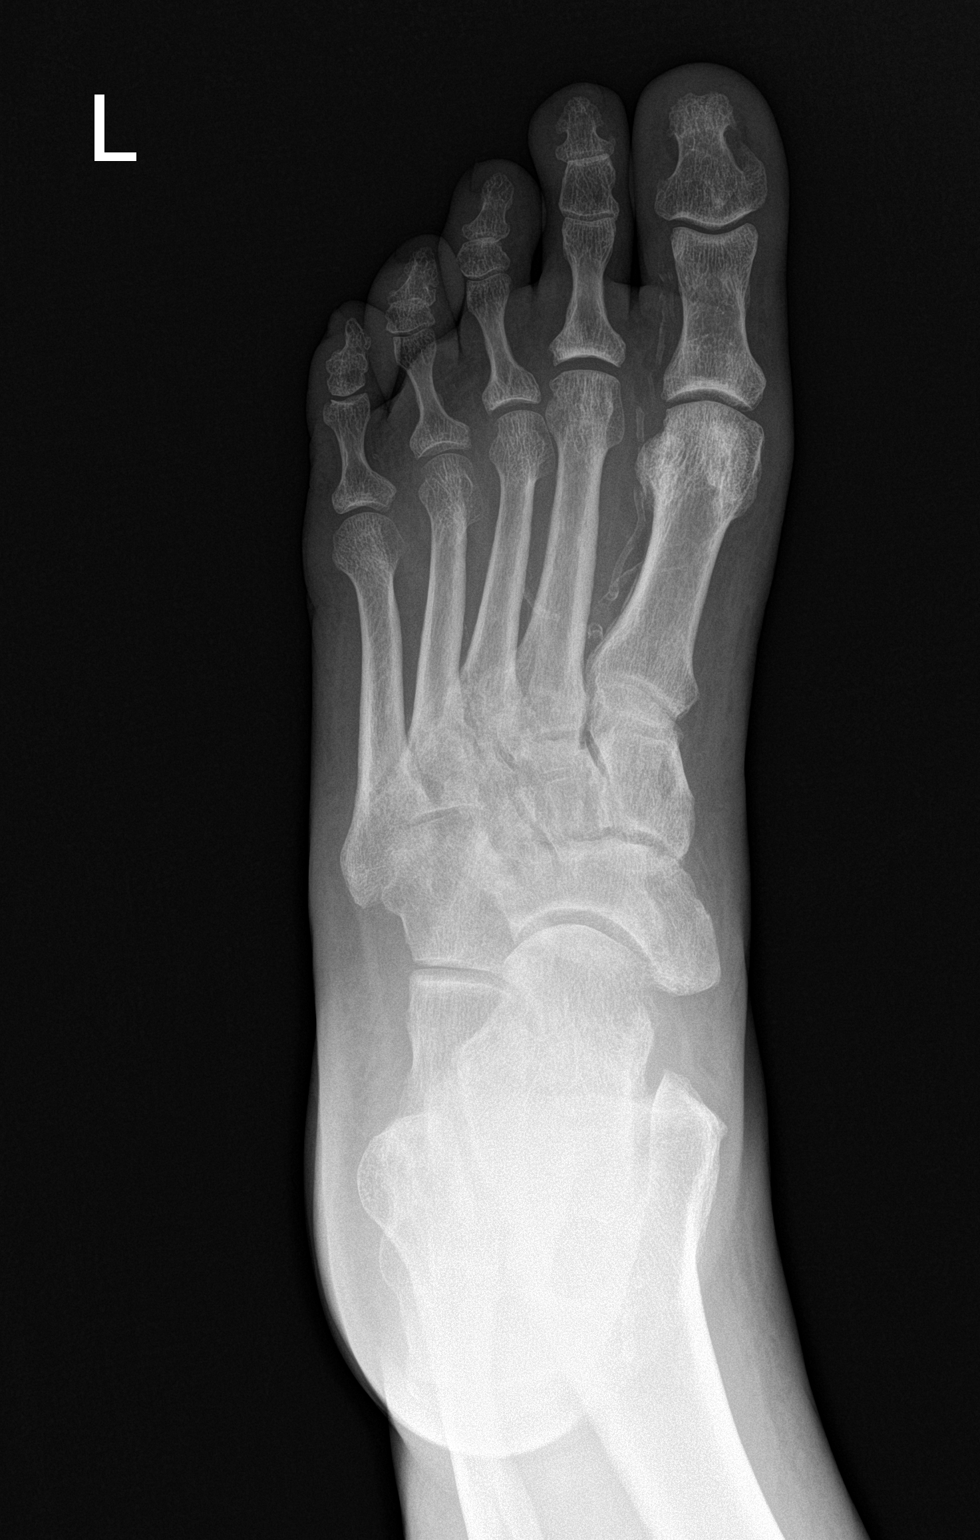
[im 2/2]
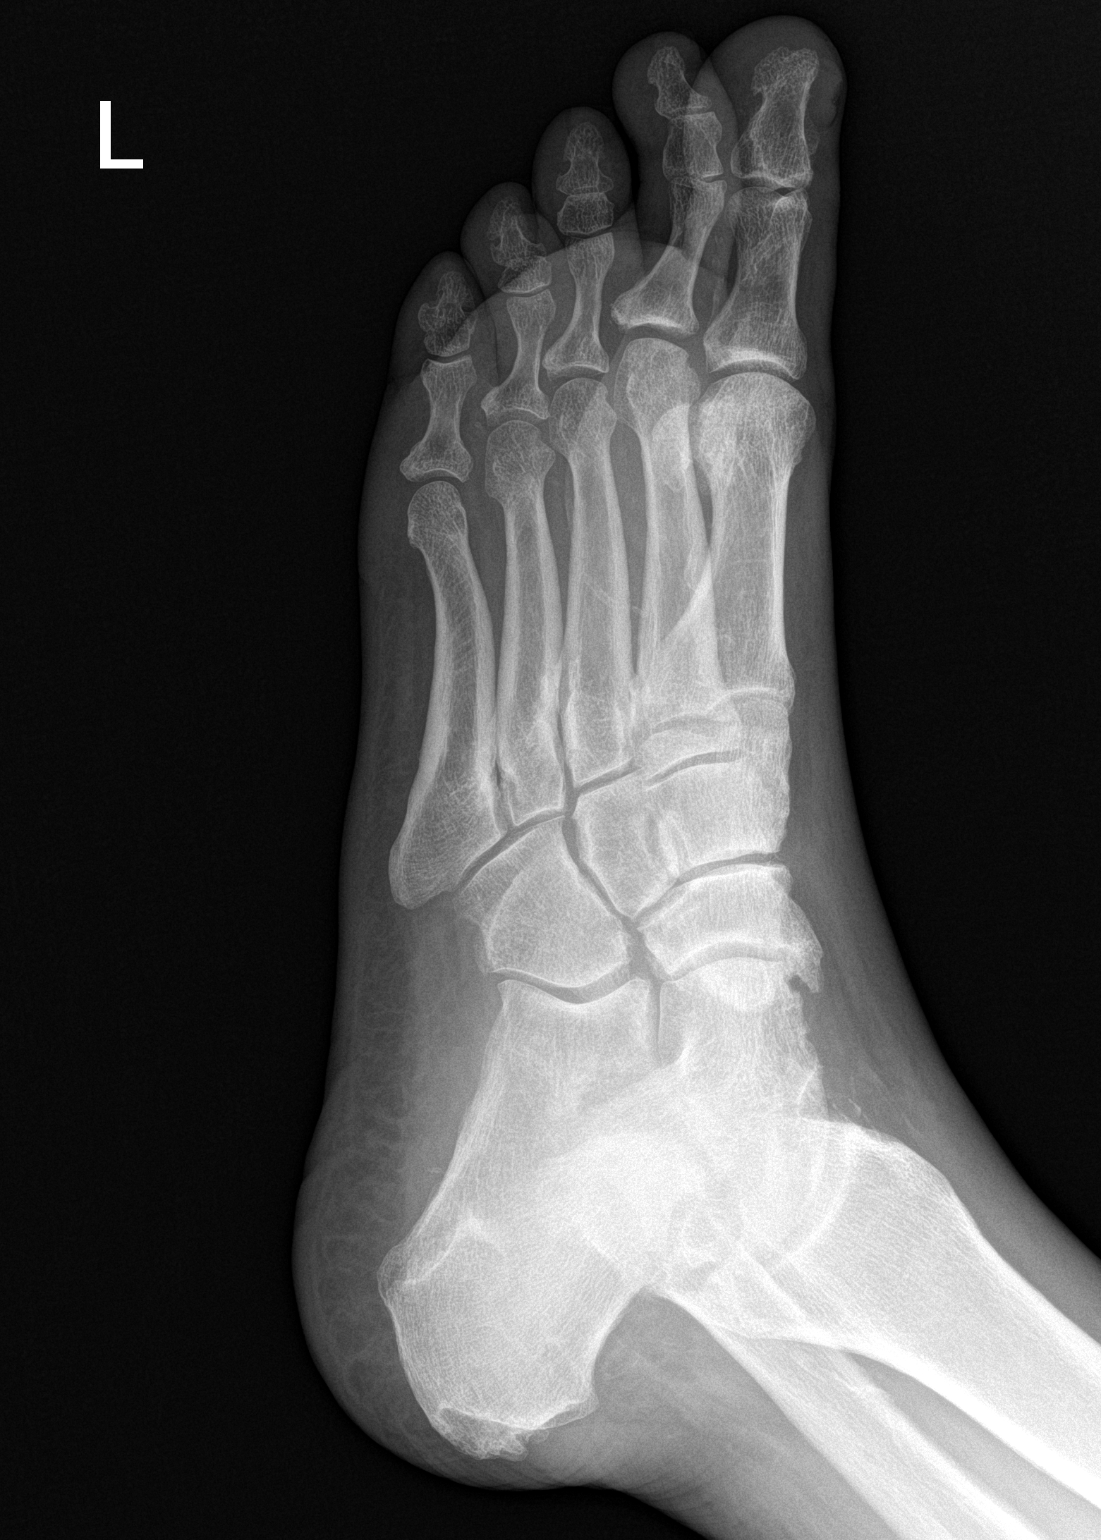

[leg]
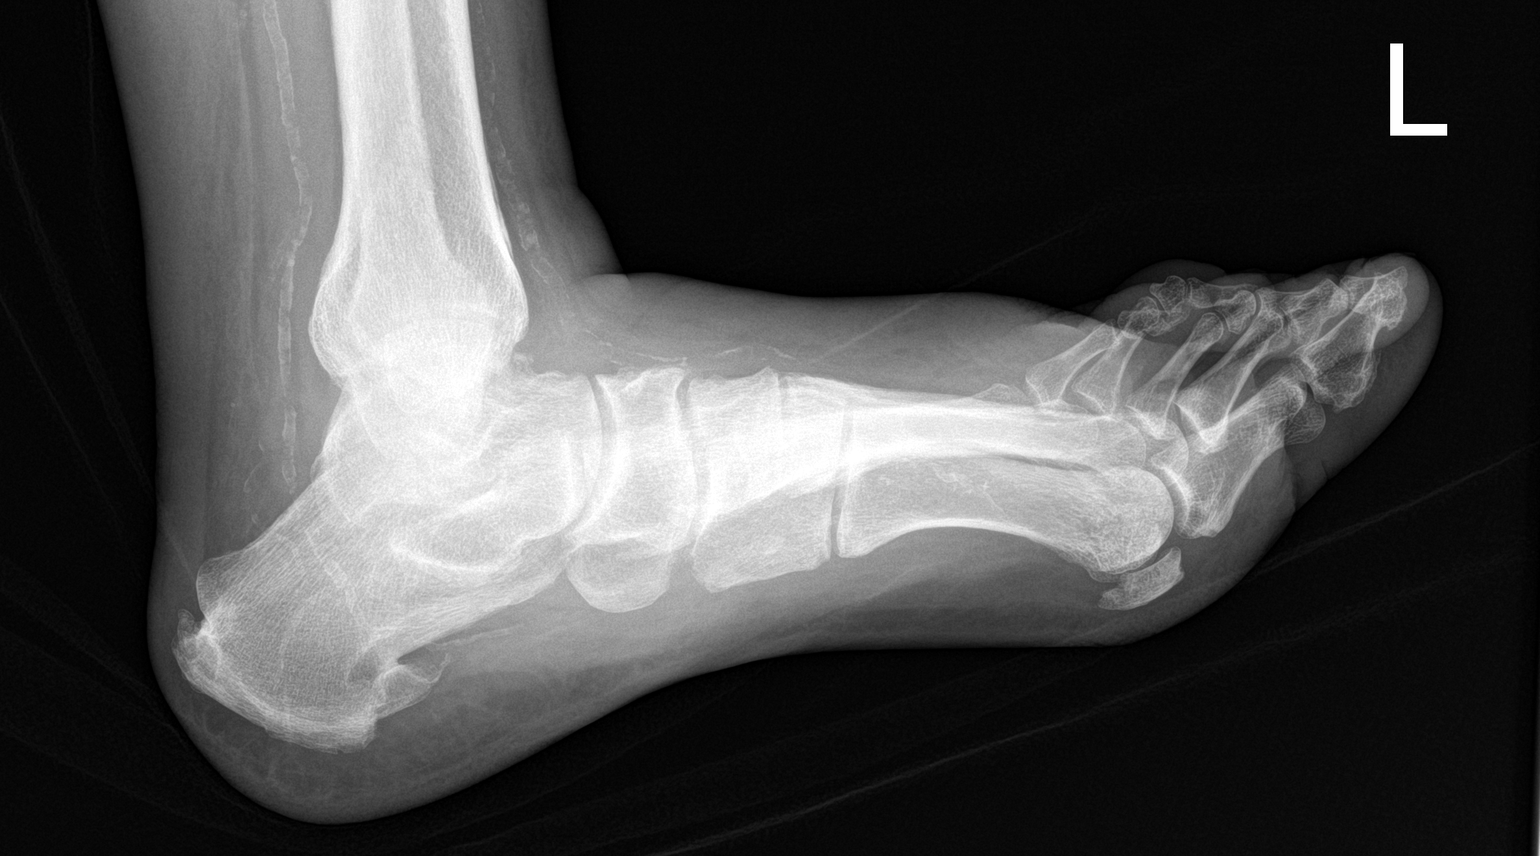

[3 of 3 positions shown; findings below may reference images not displayed]

FINDINGS: Diffuse soft tissue swelling. Vascular calcifications throughout the
soft tissues. No fracture or dislocation. No suspicious focal
osseous lesions. No os erosions or periosteal reaction. Small
Achilles and plantar left calcaneal spurs. Degenerative changes in
the dorsal tarsal joints. Pes planus deformity. No radiopaque
foreign body.
IMPRESSION: 1. Diffuse soft tissue swelling. No acute osseous abnormality.
2. Degenerative changes in the dorsal tarsal joints. Small Achilles
and plantar left calcaneal spurs. Pes planus deformity.
